# Patient Record
Sex: Male | Born: 1975 | Race: Black or African American | Hispanic: No | Marital: Single | Smoking: Former smoker
Health system: Southern US, Community
[De-identification: ages and names within clinical notes are randomized; demographics above are authoritative.]

## PROBLEM LIST (undated history)

## (undated) DIAGNOSIS — T7840XA Allergy, unspecified, initial encounter: Secondary | ICD-10-CM

## (undated) DIAGNOSIS — B20 Human immunodeficiency virus [HIV] disease: Secondary | ICD-10-CM

## (undated) DIAGNOSIS — Z21 Asymptomatic human immunodeficiency virus [HIV] infection status: Secondary | ICD-10-CM

## (undated) DIAGNOSIS — C469 Kaposi's sarcoma, unspecified: Secondary | ICD-10-CM

## (undated) DIAGNOSIS — C801 Malignant (primary) neoplasm, unspecified: Secondary | ICD-10-CM

## (undated) DIAGNOSIS — A630 Anogenital (venereal) warts: Secondary | ICD-10-CM

## (undated) HISTORY — DX: Allergy, unspecified, initial encounter: T78.40XA

## (undated) HISTORY — DX: Malignant (primary) neoplasm, unspecified: C80.1

## (undated) HISTORY — PX: OTHER SURGICAL HISTORY: SHX169

## (undated) HISTORY — DX: Kaposi's sarcoma, unspecified: C46.9

---

## 2003-08-16 ENCOUNTER — Emergency Department (HOSPITAL_COMMUNITY): Admission: EM | Admit: 2003-08-16 | Discharge: 2003-08-16 | Payer: Self-pay | Admitting: Emergency Medicine

## 2004-07-31 ENCOUNTER — Ambulatory Visit (HOSPITAL_COMMUNITY): Admission: RE | Admit: 2004-07-31 | Discharge: 2004-07-31 | Payer: Self-pay | Admitting: Infectious Diseases

## 2004-07-31 ENCOUNTER — Ambulatory Visit: Payer: Self-pay | Admitting: Infectious Diseases

## 2004-09-04 ENCOUNTER — Ambulatory Visit (HOSPITAL_COMMUNITY): Admission: RE | Admit: 2004-09-04 | Discharge: 2004-09-04 | Payer: Self-pay | Admitting: Infectious Diseases

## 2004-09-04 ENCOUNTER — Encounter (INDEPENDENT_AMBULATORY_CARE_PROVIDER_SITE_OTHER): Payer: Self-pay | Admitting: *Deleted

## 2004-09-04 ENCOUNTER — Ambulatory Visit: Payer: Self-pay | Admitting: Infectious Diseases

## 2004-09-04 LAB — CONVERTED CEMR LAB
CD4 Count: 230 uL
CD4 T Cell Abs: 230

## 2004-09-10 ENCOUNTER — Ambulatory Visit: Payer: Self-pay | Admitting: Infectious Diseases

## 2004-10-01 ENCOUNTER — Ambulatory Visit: Payer: Self-pay | Admitting: Infectious Diseases

## 2004-10-07 ENCOUNTER — Ambulatory Visit: Payer: Self-pay | Admitting: Infectious Diseases

## 2004-11-04 ENCOUNTER — Ambulatory Visit: Payer: Self-pay | Admitting: Infectious Diseases

## 2004-12-02 ENCOUNTER — Ambulatory Visit: Payer: Self-pay | Admitting: Infectious Diseases

## 2005-01-06 ENCOUNTER — Emergency Department (HOSPITAL_COMMUNITY): Admission: EM | Admit: 2005-01-06 | Discharge: 2005-01-06 | Payer: Self-pay | Admitting: Emergency Medicine

## 2005-01-28 ENCOUNTER — Ambulatory Visit: Payer: Self-pay | Admitting: Infectious Diseases

## 2005-03-23 ENCOUNTER — Ambulatory Visit: Payer: Self-pay | Admitting: Infectious Diseases

## 2005-06-17 ENCOUNTER — Ambulatory Visit: Payer: Self-pay | Admitting: Infectious Diseases

## 2005-09-08 ENCOUNTER — Ambulatory Visit: Payer: Self-pay | Admitting: Infectious Diseases

## 2005-12-01 ENCOUNTER — Encounter (INDEPENDENT_AMBULATORY_CARE_PROVIDER_SITE_OTHER): Payer: Self-pay | Admitting: *Deleted

## 2005-12-01 ENCOUNTER — Ambulatory Visit: Payer: Self-pay | Admitting: Infectious Diseases

## 2006-02-23 ENCOUNTER — Encounter (INDEPENDENT_AMBULATORY_CARE_PROVIDER_SITE_OTHER): Payer: Self-pay | Admitting: *Deleted

## 2006-02-23 ENCOUNTER — Ambulatory Visit: Payer: Self-pay | Admitting: Infectious Diseases

## 2006-03-03 ENCOUNTER — Ambulatory Visit: Payer: Self-pay | Admitting: Internal Medicine

## 2006-04-28 ENCOUNTER — Emergency Department (HOSPITAL_COMMUNITY): Admission: EM | Admit: 2006-04-28 | Discharge: 2006-04-29 | Payer: Self-pay | Admitting: Emergency Medicine

## 2006-05-20 ENCOUNTER — Encounter (INDEPENDENT_AMBULATORY_CARE_PROVIDER_SITE_OTHER): Payer: Self-pay | Admitting: *Deleted

## 2006-05-20 ENCOUNTER — Ambulatory Visit: Payer: Self-pay | Admitting: Infectious Diseases

## 2006-05-20 LAB — CONVERTED CEMR LAB
Albumin: 4.4 g/dL (ref 3.5–5.2)
Bilirubin, Direct: 0.1 mg/dL (ref 0.0–0.3)
CD4 Count: 355 microliters
CO2: 29 meq/L (ref 19–32)
Chloride: 106 meq/L (ref 96–112)
Glucose, Bld: 80 mg/dL (ref 70–99)
HDL: 59 mg/dL (ref >39)
HIV 1 RNA Quant: 49 copies/mL
LDL Cholesterol: 92 mg/dL (ref 0–99)
Potassium: 4.2 meq/L (ref 3.5–5.3)
Sodium: 141 meq/L (ref 135–145)
Total Bilirubin: 0.5 mg/dL (ref 0.3–1.2)
Total CHOL/HDL Ratio: 2.9
VLDL: 19 mg/dL (ref 0–40)

## 2006-05-21 DIAGNOSIS — B20 Human immunodeficiency virus [HIV] disease: Secondary | ICD-10-CM

## 2006-05-27 ENCOUNTER — Encounter: Payer: Self-pay | Admitting: Internal Medicine

## 2006-07-12 ENCOUNTER — Encounter (INDEPENDENT_AMBULATORY_CARE_PROVIDER_SITE_OTHER): Payer: Self-pay | Admitting: *Deleted

## 2006-07-12 LAB — CONVERTED CEMR LAB

## 2006-07-25 ENCOUNTER — Encounter (INDEPENDENT_AMBULATORY_CARE_PROVIDER_SITE_OTHER): Payer: Self-pay | Admitting: *Deleted

## 2006-08-11 ENCOUNTER — Encounter: Payer: Self-pay | Admitting: Internal Medicine

## 2006-08-11 ENCOUNTER — Ambulatory Visit: Payer: Self-pay | Admitting: Infectious Diseases

## 2006-08-11 LAB — CONVERTED CEMR LAB
ALT: 11 units/L (ref 0–53)
AST: 14 units/L (ref 0–37)
Albumin: 4.5 g/dL (ref 3.5–5.2)
Bilirubin, Direct: 0.1 mg/dL (ref 0.0–0.3)
CD4 Count: 464 microliters
CO2: 24 meq/L (ref 19–32)
Calcium: 9.3 mg/dL (ref 8.4–10.5)
Cholesterol: 174 mg/dL (ref 0–200)
Glucose, Bld: 95 mg/dL (ref 70–99)
HCV Ab: NEGATIVE
HDL: 55 mg/dL (ref >39)
HIV 1 RNA Quant: 49 copies/mL
Hepatitis B Surface Ag: NEGATIVE
Phosphorus: 3.2 mg/dL (ref 2.3–4.6)
Potassium: 3.8 meq/L (ref 3.5–5.3)
RBC / HPF: NONE SEEN (ref <3)
Sodium: 141 meq/L (ref 135–145)
Specific Gravity, Urine: 1.03 (ref 1.005–1.03)
Total Bilirubin: 0.5 mg/dL (ref 0.3–1.2)
Total CHOL/HDL Ratio: 3.2
Urine Glucose: NEGATIVE mg/dL
VLDL: 15 mg/dL (ref 0–40)
WBC, UA: NONE SEEN cells/hpf (ref <3)
pH: 6 (ref 5.0–8.0)

## 2006-09-27 ENCOUNTER — Emergency Department (HOSPITAL_COMMUNITY): Admission: EM | Admit: 2006-09-27 | Discharge: 2006-09-27 | Payer: Self-pay | Admitting: Emergency Medicine

## 2006-10-13 ENCOUNTER — Ambulatory Visit: Payer: Self-pay | Admitting: Internal Medicine

## 2006-10-13 ENCOUNTER — Telehealth: Payer: Self-pay | Admitting: Internal Medicine

## 2006-10-13 DIAGNOSIS — Z87898 Personal history of other specified conditions: Secondary | ICD-10-CM

## 2006-10-13 DIAGNOSIS — A630 Anogenital (venereal) warts: Secondary | ICD-10-CM | POA: Insufficient documentation

## 2006-10-13 DIAGNOSIS — K644 Residual hemorrhoidal skin tags: Secondary | ICD-10-CM | POA: Insufficient documentation

## 2006-10-13 DIAGNOSIS — Z87891 Personal history of nicotine dependence: Secondary | ICD-10-CM

## 2006-10-14 DIAGNOSIS — G44209 Tension-type headache, unspecified, not intractable: Secondary | ICD-10-CM

## 2006-10-14 DIAGNOSIS — R21 Rash and other nonspecific skin eruption: Secondary | ICD-10-CM

## 2006-10-15 ENCOUNTER — Telehealth: Payer: Self-pay | Admitting: Internal Medicine

## 2006-10-28 ENCOUNTER — Telehealth: Payer: Self-pay | Admitting: Internal Medicine

## 2006-11-11 ENCOUNTER — Ambulatory Visit: Payer: Self-pay | Admitting: Infectious Diseases

## 2006-11-11 ENCOUNTER — Encounter: Payer: Self-pay | Admitting: Internal Medicine

## 2006-12-02 ENCOUNTER — Ambulatory Visit: Payer: Self-pay | Admitting: Internal Medicine

## 2006-12-02 LAB — CONVERTED CEMR LAB: Chlamydia, Swab/Urine, PCR: NEGATIVE

## 2006-12-09 ENCOUNTER — Encounter: Payer: Self-pay | Admitting: Internal Medicine

## 2006-12-09 ENCOUNTER — Ambulatory Visit: Payer: Self-pay | Admitting: Infectious Diseases

## 2006-12-14 ENCOUNTER — Encounter: Payer: Self-pay | Admitting: Internal Medicine

## 2006-12-21 ENCOUNTER — Emergency Department (HOSPITAL_COMMUNITY): Admission: EM | Admit: 2006-12-21 | Discharge: 2006-12-21 | Payer: Self-pay | Admitting: Emergency Medicine

## 2006-12-23 ENCOUNTER — Telehealth: Payer: Self-pay | Admitting: Internal Medicine

## 2006-12-27 ENCOUNTER — Ambulatory Visit: Payer: Self-pay | Admitting: Infectious Diseases

## 2006-12-27 ENCOUNTER — Encounter: Payer: Self-pay | Admitting: Internal Medicine

## 2007-01-04 ENCOUNTER — Telehealth: Payer: Self-pay | Admitting: Internal Medicine

## 2007-01-10 ENCOUNTER — Encounter (INDEPENDENT_AMBULATORY_CARE_PROVIDER_SITE_OTHER): Payer: Self-pay | Admitting: *Deleted

## 2007-01-24 ENCOUNTER — Encounter: Payer: Self-pay | Admitting: Internal Medicine

## 2007-01-24 ENCOUNTER — Ambulatory Visit: Payer: Self-pay | Admitting: Internal Medicine

## 2007-01-24 LAB — CONVERTED CEMR LAB
ALT: 13 units/L (ref 0–53)
AST: 14 units/L (ref 0–37)
BUN: 10 mg/dL (ref 6–23)
Bilirubin Urine: NEGATIVE
Bilirubin, Direct: 0.1 mg/dL (ref 0.0–0.3)
CD4 Count: 332 microliters
Calcium: 9 mg/dL (ref 8.4–10.5)
Cholesterol: 185 mg/dL (ref 0–200)
Glucose, Bld: 96 mg/dL (ref 70–99)
HIV 1 RNA Quant: 2264 copies/mL
Hemoglobin, Urine: NEGATIVE
Indirect Bilirubin: 0.3 mg/dL (ref 0.0–0.9)
Ketones, ur: NEGATIVE mg/dL
Potassium: 4 meq/L (ref 3.5–5.3)
Sodium: 142 meq/L (ref 135–145)
Specific Gravity, Urine: 1.025 (ref 1.005–1.03)
Total Protein: 7.3 g/dL (ref 6.0–8.3)
Triglycerides: 109 mg/dL (ref <150)
Urine Glucose: NEGATIVE mg/dL
pH: 6.5 (ref 5.0–8.0)

## 2007-04-18 ENCOUNTER — Encounter: Payer: Self-pay | Admitting: Internal Medicine

## 2007-04-18 ENCOUNTER — Ambulatory Visit: Payer: Self-pay | Admitting: Infectious Diseases

## 2007-04-18 LAB — CONVERTED CEMR LAB
ALT: 54 units/L — ABNORMAL HIGH (ref 0–53)
Albumin: 4.5 g/dL (ref 3.5–5.2)
BUN: 13 mg/dL (ref 6–23)
Cholesterol: 156 mg/dL (ref 0–200)
HDL: 58 mg/dL (ref >39)
Indirect Bilirubin: 0.3 mg/dL (ref 0.0–0.9)
Potassium: 4 meq/L (ref 3.5–5.3)
Sodium: 143 meq/L (ref 135–145)
Total CHOL/HDL Ratio: 2.7
Total Protein: 7.2 g/dL (ref 6.0–8.3)
Triglycerides: 88 mg/dL (ref <150)
VLDL: 18 mg/dL (ref 0–40)

## 2010-06-15 LAB — CONVERTED CEMR LAB
AST: 14 units/L (ref 0–37)
Albumin: 4.3 g/dL (ref 3.5–5.2)
Alkaline Phosphatase: 67 units/L (ref 39–117)
Alkaline Phosphatase: 69 units/L (ref 39–117)
BUN: 10 mg/dL (ref 6–23)
BUN: 13 mg/dL (ref 6–23)
Bilirubin Urine: NEGATIVE
CD4 Count: 307 microliters
CD4 Count: 407 microliters
CO2: 27 meq/L (ref 19–32)
Calcium: 9.5 mg/dL (ref 8.4–10.5)
Calcium: 9.5 mg/dL (ref 8.4–10.5)
Chloride: 105 meq/L (ref 96–112)
Cholesterol: 173 mg/dL (ref 0–200)
Creatinine, Ser: 1.04 mg/dL (ref 0.40–1.50)
Creatinine, Ser: 1.15 mg/dL (ref 0.40–1.50)
Glucose, Bld: 68 mg/dL — ABNORMAL LOW (ref 70–99)
HDL: 53 mg/dL (ref >39)
HIV 1 RNA Quant: 2424 copies/mL
Indirect Bilirubin: 0.3 mg/dL (ref 0.0–0.9)
LDL Cholesterol: 105 mg/dL — ABNORMAL HIGH (ref 0–99)
Nitrite: NEGATIVE
Phosphorus: 2.8 mg/dL (ref 2.3–4.6)
Protein, ur: NEGATIVE mg/dL
Specific Gravity, Urine: 1.01 (ref 1.005–1.03)
Total Bilirubin: 0.4 mg/dL (ref 0.3–1.2)
Total Protein: 6.9 g/dL (ref 6.0–8.3)
Total Protein: 7.1 g/dL (ref 6.0–8.3)
Triglycerides: 74 mg/dL (ref <150)
Urobilinogen, UA: 0.2 (ref 0.0–1.0)

## 2011-03-02 LAB — URINALYSIS, ROUTINE W REFLEX MICROSCOPIC
Bilirubin Urine: NEGATIVE
Glucose, UA: NEGATIVE
Hgb urine dipstick: NEGATIVE
Ketones, ur: NEGATIVE
Nitrite: NEGATIVE
Protein, ur: NEGATIVE
Specific Gravity, Urine: 1.019
Urobilinogen, UA: 0.2
pH: 6.5

## 2011-08-11 ENCOUNTER — Emergency Department (HOSPITAL_COMMUNITY): Payer: Self-pay

## 2011-08-11 ENCOUNTER — Encounter (HOSPITAL_COMMUNITY): Admission: EM | Disposition: A | Discharge status: Home / Self Care | Payer: Self-pay | Source: Home / Self Care

## 2011-08-11 ENCOUNTER — Emergency Department (HOSPITAL_COMMUNITY): Payer: Self-pay | Admitting: Certified Registered Nurse Anesthetist

## 2011-08-11 ENCOUNTER — Encounter (HOSPITAL_COMMUNITY): Payer: Self-pay | Admitting: Certified Registered Nurse Anesthetist

## 2011-08-11 ENCOUNTER — Encounter (HOSPITAL_COMMUNITY): Payer: Self-pay | Admitting: *Deleted

## 2011-08-11 ENCOUNTER — Inpatient Hospital Stay (HOSPITAL_COMMUNITY)
Admission: EM | Admit: 2011-08-11 | Discharge: 2011-08-14 | DRG: 349 | Disposition: A | Discharge status: Home / Self Care | Payer: Self-pay | Attending: General Surgery | Admitting: General Surgery

## 2011-08-11 DIAGNOSIS — K611 Rectal abscess: Secondary | ICD-10-CM

## 2011-08-11 DIAGNOSIS — K612 Anorectal abscess: Secondary | ICD-10-CM

## 2011-08-11 DIAGNOSIS — Z21 Asymptomatic human immunodeficiency virus [HIV] infection status: Secondary | ICD-10-CM | POA: Diagnosis present

## 2011-08-11 DIAGNOSIS — F17200 Nicotine dependence, unspecified, uncomplicated: Secondary | ICD-10-CM | POA: Diagnosis present

## 2011-08-11 HISTORY — DX: Human immunodeficiency virus (HIV) disease: B20

## 2011-08-11 HISTORY — DX: Anogenital (venereal) warts: A63.0

## 2011-08-11 HISTORY — DX: Asymptomatic human immunodeficiency virus (hiv) infection status: Z21

## 2011-08-11 HISTORY — PX: INCISION AND DRAINAGE PERIRECTAL ABSCESS: SHX1804

## 2011-08-11 LAB — URINALYSIS, ROUTINE W REFLEX MICROSCOPIC
Bilirubin Urine: NEGATIVE
Ketones, ur: NEGATIVE mg/dL
Specific Gravity, Urine: 1.022 (ref 1.005–1.030)
pH: 6 (ref 5.0–8.0)

## 2011-08-11 LAB — DIFFERENTIAL
Basophils Relative: 0 % (ref 0–1)
Eosinophils Absolute: 0 10*3/uL (ref 0.0–0.7)
Lymphs Abs: 1.4 10*3/uL (ref 0.7–4.0)
Monocytes Relative: 13 % — ABNORMAL HIGH (ref 3–12)
Neutro Abs: 9 10*3/uL — ABNORMAL HIGH (ref 1.7–7.7)
Neutrophils Relative %: 75 % (ref 43–77)

## 2011-08-11 LAB — CBC
Hemoglobin: 14.1 g/dL (ref 13.0–17.0)
MCH: 30.5 pg (ref 26.0–34.0)
Platelets: 201 10*3/uL (ref 150–400)
RBC: 4.62 MIL/uL (ref 4.22–5.81)
WBC: 11.9 10*3/uL — ABNORMAL HIGH (ref 4.0–10.5)

## 2011-08-11 LAB — COMPREHENSIVE METABOLIC PANEL
ALT: 16 U/L (ref 0–53)
Albumin: 3.4 g/dL — ABNORMAL LOW (ref 3.5–5.2)
Alkaline Phosphatase: 75 U/L (ref 39–117)
Chloride: 101 mEq/L (ref 96–112)
Glucose, Bld: 104 mg/dL — ABNORMAL HIGH (ref 70–99)
Potassium: 3.5 mEq/L (ref 3.5–5.1)
Sodium: 136 mEq/L (ref 135–145)
Total Bilirubin: 0.5 mg/dL (ref 0.3–1.2)
Total Protein: 7.2 g/dL (ref 6.0–8.3)

## 2011-08-11 LAB — URINE MICROSCOPIC-ADD ON

## 2011-08-11 SURGERY — INCISION AND DRAINAGE, ABSCESS, PERIRECTAL
Anesthesia: General | Site: Anus | Wound class: Clean Contaminated

## 2011-08-11 MED ORDER — FENTANYL CITRATE 0.05 MG/ML IJ SOLN
INTRAMUSCULAR | Status: DC | PRN
  Administered 2011-08-11 (×2): 100 ug via INTRAVENOUS
  Administered 2011-08-11: 50 ug via INTRAVENOUS

## 2011-08-11 MED ORDER — 0.9 % SODIUM CHLORIDE (POUR BTL) OPTIME
TOPICAL | Status: DC | PRN
  Administered 2011-08-11: 1000 mL

## 2011-08-11 MED ORDER — OXYCODONE-ACETAMINOPHEN 5-325 MG PO TABS
1.0000 | ORAL_TABLET | ORAL | Status: DC | PRN
  Administered 2011-08-11 – 2011-08-12 (×3): 2 via ORAL
  Administered 2011-08-12: 1 via ORAL
  Administered 2011-08-12 – 2011-08-14 (×5): 2 via ORAL
  Filled 2011-08-11 (×9): qty 2

## 2011-08-11 MED ORDER — ONDANSETRON HCL 4 MG/2ML IJ SOLN: 4.0000 mg | Freq: Once | INTRAMUSCULAR | Status: DC | PRN

## 2011-08-11 MED ORDER — HYDROMORPHONE HCL PF 1 MG/ML IJ SOLN
INTRAMUSCULAR | Status: AC
  Administered 2011-08-11: 0.25 mg via INTRAVENOUS
  Filled 2011-08-11: qty 1

## 2011-08-11 MED ORDER — LACTATED RINGERS IV SOLN
INTRAVENOUS | Status: DC | PRN
  Administered 2011-08-11: 17:00:00 via INTRAVENOUS

## 2011-08-11 MED ORDER — SODIUM CHLORIDE 0.9 % IV SOLN
INTRAVENOUS | Status: DC | PRN
  Administered 2011-08-11: 16:00:00 via INTRAVENOUS

## 2011-08-11 MED ORDER — PIPERACILLIN-TAZOBACTAM 3.375 G IVPB
3.3750 g | INTRAVENOUS | Status: AC
  Filled 2011-08-11: qty 50

## 2011-08-11 MED ORDER — PIPERACILLIN-TAZOBACTAM 3.375 G IVPB
3.3750 g | Freq: Three times a day (TID) | INTRAVENOUS | Status: DC
  Administered 2011-08-11 – 2011-08-14 (×8): 3.375 g via INTRAVENOUS
  Filled 2011-08-11 (×11): qty 50

## 2011-08-11 MED ORDER — ONDANSETRON HCL 4 MG PO TABS: 4.0000 mg | ORAL_TABLET | Freq: Four times a day (QID) | ORAL | Status: DC | PRN

## 2011-08-11 MED ORDER — IOHEXOL 300 MG/ML  SOLN
20.0000 mL | INTRAMUSCULAR | Status: AC
  Administered 2011-08-11 (×2): 20 mL via ORAL

## 2011-08-11 MED ORDER — HYDROMORPHONE HCL PF 1 MG/ML IJ SOLN
1.0000 mg | Freq: Once | INTRAMUSCULAR | Status: AC
  Administered 2011-08-11: 1 mg via INTRAVENOUS
  Filled 2011-08-11: qty 1

## 2011-08-11 MED ORDER — ACETAMINOPHEN 325 MG PO TABS: 650.0000 mg | ORAL_TABLET | Freq: Once | ORAL | Status: DC

## 2011-08-11 MED ORDER — BUPIVACAINE-EPINEPHRINE PF 0.5-1:200000 % IJ SOLN
INTRAMUSCULAR | Status: DC | PRN
  Administered 2011-08-11: 30 mL

## 2011-08-11 MED ORDER — ONDANSETRON HCL 4 MG/2ML IJ SOLN
4.0000 mg | Freq: Once | INTRAMUSCULAR | Status: AC
  Administered 2011-08-11: 4 mg via INTRAVENOUS
  Filled 2011-08-11: qty 2

## 2011-08-11 MED ORDER — IOHEXOL 300 MG/ML  SOLN
100.0000 mL | Freq: Once | INTRAMUSCULAR | Status: AC | PRN
  Administered 2011-08-11: 100 mL via INTRAVENOUS

## 2011-08-11 MED ORDER — MIDAZOLAM HCL 5 MG/5ML IJ SOLN
INTRAMUSCULAR | Status: DC | PRN
  Administered 2011-08-11: 2 mg via INTRAVENOUS

## 2011-08-11 MED ORDER — ACETAMINOPHEN 325 MG PO TABS
650.0000 mg | ORAL_TABLET | Freq: Once | ORAL | Status: AC
  Administered 2011-08-11: 650 mg via ORAL
  Filled 2011-08-11: qty 2

## 2011-08-11 MED ORDER — POTASSIUM CHLORIDE IN NACL 20-0.9 MEQ/L-% IV SOLN
INTRAVENOUS | Status: DC
  Administered 2011-08-11: 22:00:00 via INTRAVENOUS
  Administered 2011-08-12: 75 mL/h via INTRAVENOUS
  Administered 2011-08-13: 19:00:00 via INTRAVENOUS
  Administered 2011-08-14: 75 mL/h via INTRAVENOUS
  Filled 2011-08-11 (×7): qty 1000

## 2011-08-11 MED ORDER — ONDANSETRON HCL 4 MG/2ML IJ SOLN: 4.0000 mg | Freq: Four times a day (QID) | INTRAMUSCULAR | Status: DC | PRN

## 2011-08-11 MED ORDER — PROPOFOL 10 MG/ML IV BOLUS
INTRAVENOUS | Status: DC | PRN
  Administered 2011-08-11: 50 mg via INTRAVENOUS
  Administered 2011-08-11: 200 mg via INTRAVENOUS

## 2011-08-11 MED ORDER — MORPHINE SULFATE 2 MG/ML IJ SOLN
2.0000 mg | INTRAMUSCULAR | Status: DC | PRN
  Administered 2011-08-12: 2 mg via INTRAVENOUS
  Administered 2011-08-12: 4 mg via INTRAVENOUS
  Filled 2011-08-11: qty 1
  Filled 2011-08-11 (×2): qty 2

## 2011-08-11 MED ORDER — PIPERACILLIN-TAZOBACTAM 3.375 G IVPB 30 MIN
INTRAVENOUS | Status: DC | PRN
  Administered 2011-08-11: 3.375 g via INTRAVENOUS

## 2011-08-11 MED ORDER — LIDOCAINE HCL (CARDIAC) 20 MG/ML IV SOLN
INTRAVENOUS | Status: DC | PRN
  Administered 2011-08-11: 100 mg via INTRAVENOUS

## 2011-08-11 MED ORDER — HYDROMORPHONE HCL PF 1 MG/ML IJ SOLN
0.2500 mg | INTRAMUSCULAR | Status: DC | PRN
  Administered 2011-08-11 (×2): 0.25 mg via INTRAVENOUS

## 2011-08-11 SURGICAL SUPPLY — 41 items
BLADE SURG 15 STRL LF DISP TIS (BLADE) ×1 IMPLANT
BLADE SURG 15 STRL SS (BLADE) ×2
CANISTER SUCTION 2500CC (MISCELLANEOUS) ×2 IMPLANT
CLEANER TIP ELECTROSURG 2X2 (MISCELLANEOUS) IMPLANT
CLOTH BEACON ORANGE TIMEOUT ST (SAFETY) ×2 IMPLANT
COVER SURGICAL LIGHT HANDLE (MISCELLANEOUS) ×2 IMPLANT
DRAPE LAPAROTOMY T 102X78X121 (DRAPES) ×2 IMPLANT
DRSG PAD ABDOMINAL 8X10 ST (GAUZE/BANDAGES/DRESSINGS) IMPLANT
ELECT REM PT RETURN 9FT ADLT (ELECTROSURGICAL)
ELECTRODE REM PT RTRN 9FT ADLT (ELECTROSURGICAL) IMPLANT
GAUZE PACKING IODOFORM 1 (PACKING) IMPLANT
GAUZE PACKING IODOFORM 1/2 (PACKING) ×2 IMPLANT
GAUZE SPONGE 4X4 16PLY XRAY LF (GAUZE/BANDAGES/DRESSINGS) IMPLANT
GLOVE BIOGEL PI IND STRL 7.0 (GLOVE) ×2 IMPLANT
GLOVE BIOGEL PI IND STRL 8 (GLOVE) ×1 IMPLANT
GLOVE BIOGEL PI INDICATOR 7.0 (GLOVE) ×2
GLOVE BIOGEL PI INDICATOR 8 (GLOVE) ×1
GLOVE SS BIOGEL STRL SZ 7.5 (GLOVE) ×1 IMPLANT
GLOVE SUPERSENSE BIOGEL SZ 7.5 (GLOVE) ×1
GLOVE SURG SS PI 7.0 STRL IVOR (GLOVE) ×2 IMPLANT
GOWN STRL NON-REIN LRG LVL3 (GOWN DISPOSABLE) ×2 IMPLANT
GOWN STRL REIN XL XLG (GOWN DISPOSABLE) ×2 IMPLANT
HOSE LASER SMOKE EVAC HA-3 STR (MISCELLANEOUS) ×2 IMPLANT
KIT BASIN OR (CUSTOM PROCEDURE TRAY) ×2 IMPLANT
KIT ROOM TURNOVER OR (KITS) ×2 IMPLANT
NS IRRIG 1000ML POUR BTL (IV SOLUTION) ×2 IMPLANT
PACK GENERAL/GYN (CUSTOM PROCEDURE TRAY) ×2 IMPLANT
PACK LITHOTOMY IV (CUSTOM PROCEDURE TRAY) IMPLANT
PAD ARMBOARD 7.5X6 YLW CONV (MISCELLANEOUS) ×2 IMPLANT
PENCIL BUTTON HOLSTER BLD 10FT (ELECTRODE) IMPLANT
SPONGE GAUZE 4X4 12PLY (GAUZE/BANDAGES/DRESSINGS) ×2 IMPLANT
SUT VIC AB 3-0 SH 18 (SUTURE) ×2 IMPLANT
SWAB COLLECTION DEVICE MRSA (MISCELLANEOUS) ×2 IMPLANT
TAPE CLOTH SURG 6X10 WHT LF (GAUZE/BANDAGES/DRESSINGS) ×2 IMPLANT
TOWEL OR 17X24 6PK STRL BLUE (TOWEL DISPOSABLE) ×2 IMPLANT
TOWEL OR 17X26 10 PK STRL BLUE (TOWEL DISPOSABLE) ×2 IMPLANT
TUBE ANAEROBIC SPECIMEN COL (MISCELLANEOUS) ×2 IMPLANT
TUBE CONNECTING 12X1/4 (SUCTIONS) ×2 IMPLANT
UNDERPAD 30X30 INCONTINENT (UNDERPADS AND DIAPERS) ×2 IMPLANT
WATER STERILE IRR 1000ML POUR (IV SOLUTION) ×2 IMPLANT
YANKAUER SUCT BULB TIP NO VENT (SUCTIONS) IMPLANT

## 2011-08-11 NOTE — ED Notes (Signed)
All of pt's clothes, shoes, backpack, jewelry piercings and cell phone were placed in a pt's belongings bag by pt's family member and the family member will take all the belongings home; pt getting ready to go to surgery

## 2011-08-11 NOTE — Preoperative (Signed)
Beta Blockers   Reason not to administer Beta Blockers:Not Applicable 

## 2011-08-11 NOTE — ED Notes (Signed)
Pt here with abd pain, nausea vomiting and rectal bleeding, sts stool is normal consistency but star shaped, does have rectal hemmorrhoids on assessment, no gross blood noted. Pt reports fever, sts that this is ongoing for 1 week. And getting worse. Patient is hot to touch.

## 2011-08-11 NOTE — H&P (Signed)
Todd Vincent 02-Feb-1976  [id number].   Primary Care MD: None yet Requesting MD: EDP Chief Complaint/Reason for Consult: perirectal abscess HPI: This is a 36 yo male who has a history of HIV who has not been treated in over 4 years.  He just recently moved back to [place] from [place] 1 week ago.  2 days ago he started developing perirectal pain and fevers.  He has had fevers at home as high as 102.8.  He has noticed some blood in his stools as well as some BRB clots.  He has also noticed his stool has changed shape into "star shapes."  He has done a self rectal exam and feels like there is some pressure being exuded on the rectal wall that is not normal.  He came to the [facility] where he had a CT scan that reveals a perirectal horseshoe type abscess.  We have been asked to see for admission.  Review of Systems: Please see HPI, otherwise all other systems are negative.  History reviewed. No pertinent family history.  Past Medical History  Diagnosis Date  . HIV (human immunodeficiency virus infection)   . Anal condyloma     Past Surgical History  Procedure Date  . Anal condyloma resection     Social History:  reports that he has been smoking.  He does not have any smokeless tobacco history on file. He reports that he drinks alcohol. He reports that he does not use illicit drugs.  Allergies:  Allergies  Allergen Reactions  . Aspirin     Medications Prior to Admission  Medication Dose Route Frequency Provider Last Rate Last Dose  . acetaminophen (TYLENOL) tablet 650 mg  650 mg Oral Once Renne Crigler, [place]   650 mg at 08/11/11 1046  . HYDROmorphone (DILAUDID) injection 1 mg  1 mg Intravenous Once Renne Crigler, PA   1 mg at 08/11/11 1308  . HYDROmorphone (DILAUDID) injection 1 mg  1 mg Intravenous Once Rodena Medin, PA-C   1 mg at 08/11/11 1409  . iohexol (OMNIPAQUE) 300 MG/ML solution 100 mL  100 mL Intravenous Once PRN Medication Radiologist, MD   100 mL at 08/11/11 1159  . iohexol  (OMNIPAQUE) 300 MG/ML solution 20 mL  20 mL Oral Q1 Hr x 2 Medication Radiologist, MD   20 mL at 08/11/11 1139  . ondansetron (ZOFRAN) injection 4 mg  4 mg Intravenous Once Renne Crigler, PA   4 mg at 08/11/11 6578  . piperacillin-tazobactam (ZOSYN) IVPB 3.375 g  3.375 g Intravenous Q8H Letha Cape, PA       No current outpatient prescriptions on file as of 08/11/2011.    Blood pressure 114/81, pulse 93, temperature 101.9 F (38.8 C), temperature source Oral, resp. rate 18, height 5\' 7"  (1.702 m), weight 140 lb (63.504 kg), SpO2 99.00%. Physical Exam: General: pleasant, WD, WN male who is laying in bed in NAD HEENT: head is normocephalic, atraumatic.  Sclera are noninjected.  PERRL.  Ears and nose without any masses or lesions.  Mouth is pink and moist Heart: regular, rate, and rhythm.  Normal s1,s2. No obvious murmurs, gallops, or rubs noted.  Palpable radial and pedal pulses bilaterally Lungs: CTAB, no wheezes, rhonchi, or rales noted.  Respiratory effort nonlabored Abd: soft, minimal tenderness, ND, +BS, no masses, hernias, or organomegaly Rectal: DRE deferred to OR, external exam reveals some skin tags as well as few condyloma.  He is tender greatest on the right side of his rectum and slightly posterior.  No frank fluctuance or erythema is noted.  No drainage noted.  No blood seen Psych: A&Ox3 with an appropriate affect.    Results for orders placed during the hospital encounter of 08/11/11 (from the past 48 hour(s))  CBC     Status: Abnormal   Collection Time   08/11/11  9:36 AM      Component Value Range Comment   WBC 11.9 (*) 4.0 - 10.5 (K/uL)    RBC 4.62  4.22 - 5.81 (MIL/uL)    Hemoglobin 14.1  13.0 - 17.0 (g/dL)    HCT 16.1  09.6 - 04.5 (%)    MCV 87.7  78.0 - 100.0 (fL)    MCH 30.5  26.0 - 34.0 (pg)    MCHC 34.8  30.0 - 36.0 (g/dL)    RDW 40.9  81.1 - 91.4 (%)    Platelets 201  150 - 400 (K/uL)   DIFFERENTIAL     Status: Abnormal   Collection Time   08/11/11  9:36 AM        Component Value Range Comment   Neutrophils Relative 75  43 - 77 (%)    Neutro Abs 9.0 (*) 1.7 - 7.7 (K/uL)    Lymphocytes Relative 11 (*) 12 - 46 (%)    Lymphs Abs 1.4  0.7 - 4.0 (K/uL)    Monocytes Relative 13 (*) 3 - 12 (%)    Monocytes Absolute 1.6 (*) 0.1 - 1.0 (K/uL)    Eosinophils Relative 0  0 - 5 (%)    Eosinophils Absolute 0.0  0.0 - 0.7 (K/uL)    Basophils Relative 0  0 - 1 (%)    Basophils Absolute 0.0  0.0 - 0.1 (K/uL)   COMPREHENSIVE METABOLIC PANEL     Status: Abnormal   Collection Time   08/11/11  9:36 AM      Component Value Range Comment   Sodium 136  135 - 145 (mEq/L)    Potassium 3.5  3.5 - 5.1 (mEq/L)    Chloride 101  96 - 112 (mEq/L)    CO2 27  19 - 32 (mEq/L)    Glucose, Bld 104 (*) 70 - 99 (mg/dL)    BUN 7  6 - 23 (mg/dL)    Creatinine, Ser 7.82  0.50 - 1.35 (mg/dL)    Calcium 9.2  8.4 - 10.5 (mg/dL)    Total Protein 7.2  6.0 - 8.3 (g/dL)    Albumin 3.4 (*) 3.5 - 5.2 (g/dL)    AST 14  0 - 37 (U/L)    ALT 16  0 - 53 (U/L)    Alkaline Phosphatase 75  39 - 117 (U/L)    Total Bilirubin 0.5  0.3 - 1.2 (mg/dL)    GFR calc non Af Amer >90  >90 (mL/min)    GFR calc Af Amer >90  >90 (mL/min)   LIPASE, BLOOD     Status: Normal   Collection Time   08/11/11  9:36 AM      Component Value Range Comment   Lipase 12  11 - 59 (U/L)   OCCULT BLOOD, POC DEVICE     Status: Normal   Collection Time   08/11/11 10:18 AM      Component Value Range Comment   Fecal Occult Bld NEGATIVE     URINALYSIS, ROUTINE W REFLEX MICROSCOPIC     Status: Abnormal   Collection Time   08/11/11 10:23 AM      Component Value Range Comment  Color, Urine YELLOW  YELLOW     APPearance CLOUDY (*) CLEAR     Specific Gravity, Urine 1.022  1.005 - 1.030     pH 6.0  5.0 - 8.0     Glucose, UA NEGATIVE  NEGATIVE (mg/dL)    Hgb urine dipstick SMALL (*) NEGATIVE     Bilirubin Urine NEGATIVE  NEGATIVE     Ketones, ur NEGATIVE  NEGATIVE (mg/dL)    Protein, ur 161 (*) NEGATIVE (mg/dL)     Urobilinogen, UA 1.0  0.0 - 1.0 (mg/dL)    Nitrite NEGATIVE  NEGATIVE     Leukocytes, UA NEGATIVE  NEGATIVE    URINE MICROSCOPIC-ADD ON     Status: Normal   Collection Time   08/11/11 10:23 AM      Component Value Range Comment   Squamous Epithelial / LPF RARE  RARE     WBC, UA 0-2  <3 (WBC/hpf)    RBC / HPF 0-2  <3 (RBC/hpf)    Urine-Other MUCOUS PRESENT      Ct Abdomen Pelvis W Contrast  08/11/2011  *RADIOLOGY REPORT*  Clinical Data: Rectal pain, fever.  Question perirectal abscess.  CT ABDOMEN AND PELVIS WITH CONTRAST  Technique:  Multidetector CT imaging of the abdomen and pelvis was performed following the standard protocol during bolus administration of intravenous contrast.  Contrast:  100 ml Omnipaque 300 IV.  Comparison: None  Findings: Lung bases are clear.  No effusions.  Heart is normal size.  Liver, gallbladder, spleen, pancreas, adrenals and kidneys are unremarkable.  Appendix is visualized and is normal.  There is a fluid collection posterior to the anorectal junction and extending into the left lateral wall of the rectum compatible with abscess.  A large component is noted posterior to the rectum, measuring 4.0 x 1.8 cm.  Surrounding inflammatory stranding.  The remainder of the large and small bowel are unremarkable.  No evidence of bowel obstruction.  Urinary bladder is unremarkable.  No acute bony abnormality.  IMPRESSION: Posterior perirectal abscess which extends into the left lateral wall of the rectum.  Original Report Authenticated By: Cyndie Chime, M.D.       Assessment/Plan 1. Perirectal horseshoe abscess 2. HIV  Plan: 1. We will get the patient admitted and take him to the operating room where he will undergo an incision and drainage.  We have discussed this with him along with risks and complications including but not limited to bleeding and infection.  I have explained to him that he will likely require wound care and dressing changes post-operatively.  He  understands and wishes to proceed.  We will start him on Zosyn currently.  We will likely obtain cultures in the OR.  Kyliyah Stirn E 08/11/2011, 3:11 PM

## 2011-08-11 NOTE — H&P (Signed)
I have interviewed and examined the patient and personally reviewed the imaging. I agree with Barnetta Chapel PA note above.he has a large high posterior perirectal abscess that'll require incision and drainage in the operating room. I discussed the procedure with him including indications and risks of general anesthesia, bleeding, infection, and possible fistula formation. He understands and agrees.

## 2011-08-11 NOTE — Op Note (Signed)
Preoperative Diagnosis: Perirectal abscess [566] rectal bleeding fever  Postoprative Diagnosis: Perirectal abscess [566] rectal bleeding fever  Procedure: Procedure(s): IRRIGATION AND DEBRIDEMENT PERIRECTAL ABSCESS   Surgeon: Glenna Fellows T   Assistants: None  Anesthesia:  General LMA anesthesiaDiagnos  Indications:  Patient is a 36 year old male HIV positive who presents with 48 hours of increasing rectal pain, fever, and some bloody rectal drainage. External rectal exam is unremarkable for mass or inflammation. CT scan was performed which shows an extensive supralevator posterior horseshoe abscess. I recommended exam and incision and drainage under anesthesia in the operating room. Indications for the procedure and risks were discussed and all his questions answered.   Procedure Detail: Patient is brought to the operating room, placed in supine position on the operating table, and laryngeal mask general anesthesia induced. He was carefully padded and positioned in lithotomy position and the perineum widely sterilely prepped and draped. Patient timeout was performed and correct procedure verified. He had received broad-spectrum IV antibiotics. Digital rectal exam showed some purulent drainage from the anus. There was a palpable mass and swelling posteriorly and in the left lateral position high above the anus about 5 or 6 cm. Direct transanal exam did not reveal a specific opening or source of drainage. I then made a curvilinear incision in the posterior and left lateral perirectal space and dissection was carried down through the subcutaneous tissue with cautery. Feeling the abscess internally I deepened the dissection down through a portion of the levator and entered a large abscess cavity with thick purulent material. This was cultured. Using cautery and blunt dissection at the level of the levator the cavity was further opened and there was a extensive horseshoe component extending  posteriorly and over toward the right side with a large component on the left side of the rectum. This was all opened as widely as possible and irrigated. One bleeder on the anterior portion of the levator that I opened was suture-ligated with 3-0 Vicryl. The cavity was then widely packed with 1/2 inch iodoform gauze. The soft tissue had been infiltrated with 30 cc of half percent Marcaine with epinephrine. There was no bleeding. The sponge needle and instrument counts were correct. Bulky gauze dressing was applied.   Findings: Large supralevator horseshoe perirectal abscess  Estimated Blood Loss:  Minimal         Drains: 1/2 inch iodoform gauze packing  Blood Given: none          Specimens: culture and sensitivity        Complications:  * No complications entered in OR log *         Disposition: PACU - hemodynamically stable.         Condition: stable  Mariella Saa MD, FACS  08/11/2011, 5:05 PM

## 2011-08-11 NOTE — ED Provider Notes (Signed)
History     CSN: [id number]  Arrival date & time 08/11/11  7829   First MD Initiated Contact with Patient 08/11/11 [phone]      Chief Complaint  Patient presents with  . Fever  . Rectal Bleeding    (Consider location/radiation/quality/duration/timing/severity/associated sxs/prior treatment) HPI Comments: Patient with history of HIV not currently taking ARV -- presents with onset of fever and worsening rectal pain since yesterday. Patient has noted some bright red blood in stool and on toilet paper as well. Fever to 102F at home. Motrin without relief. No URI symptoms, vomiting, chest pain, cough, SOB, abdominal pain, urinary symptoms. No history of abdominal surgery.   Patient is a 36 y.o. male presenting with fever. The history is provided by the patient.  Fever Primary symptoms of the febrile illness include fever and nausea. Primary symptoms do not include headaches, cough, shortness of breath, abdominal pain, vomiting, diarrhea, dysuria, myalgias or rash.  Risk factors for febrile illness include HIV.   Past Medical History  Diagnosis Date  . HIV (human immunodeficiency virus infection)     History reviewed. No pertinent past surgical history.  No family history on file.  History  Substance Use Topics  . Smoking status: Current Everyday Smoker  . Smokeless tobacco: Not on file  . Alcohol Use: Yes      Review of Systems  Constitutional: Positive for fever.  HENT: Negative for sore throat and rhinorrhea.   Eyes: Negative for redness.  Respiratory: Negative for cough and shortness of breath.   Cardiovascular: Negative for chest pain.  Gastrointestinal: Positive for nausea, blood in stool and rectal pain. Negative for vomiting, abdominal pain and diarrhea.  Genitourinary: Negative for dysuria and hematuria.  Musculoskeletal: Negative for myalgias and back pain.  Skin: Negative for rash.  Neurological: Negative for headaches.    Allergies  Aspirin  Home  Medications   Current Outpatient Rx  Name Route Sig Dispense Refill  . IBUPROFEN 200 MG PO TABS Oral Take 400 mg by mouth every 8 (eight) hours as needed. For pain      BP 109/70  Pulse 135  Temp(Src) 101.9 F (38.8 C) (Oral)  Resp 16  Ht 5\' 7"  (1.702 m)  Wt 140 lb (63.504 kg)  BMI 21.93 kg/m2  SpO2 98%  Physical Exam  Nursing note and vitals reviewed. Constitutional: He is oriented to person, place, and time. He appears well-developed and well-nourished.  HENT:  Head: Normocephalic and atraumatic.  Eyes: Conjunctivae are normal. Right eye exhibits no discharge. Left eye exhibits no discharge.  Neck: Normal range of motion. Neck supple.  Cardiovascular: Regular rhythm and normal heart sounds.  Tachycardia present.   Pulses:      Radial pulses are 2+ on the right side, and 2+ on the left side.  Pulmonary/Chest: Effort normal and breath sounds normal.  Abdominal: Soft. Bowel sounds are normal. There is no tenderness. There is no rebound and no guarding.  Genitourinary: Rectal exam shows external hemorrhoid and tenderness. Rectal exam shows no internal hemorrhoid, no fissure and no mass.  Musculoskeletal: He exhibits no edema and no tenderness.  Neurological: He is alert and oriented to person, place, and time.  Skin: Skin is warm and dry.  Psychiatric: He has a normal mood and affect.    ED Course  Procedures (including critical care time)  Labs Reviewed  CBC - Abnormal; Notable for the following:    WBC 11.9 (*)    All other components within normal limits  DIFFERENTIAL - Abnormal; Notable for the following:    Neutro Abs 9.0 (*)    Lymphocytes Relative 11 (*)    Monocytes Relative 13 (*)    Monocytes Absolute 1.6 (*)    All other components within normal limits  COMPREHENSIVE METABOLIC PANEL - Abnormal; Notable for the following:    Glucose, Bld 104 (*)    Albumin 3.4 (*)    All other components within normal limits  URINALYSIS, ROUTINE W REFLEX MICROSCOPIC -  Abnormal; Notable for the following:    APPearance CLOUDY (*)    Hgb urine dipstick SMALL (*)    Protein, ur 100 (*)    All other components within normal limits  LIPASE, BLOOD  OCCULT BLOOD, POC DEVICE  URINE MICROSCOPIC-ADD ON   Ct Abdomen Pelvis W Contrast  08/11/2011  *RADIOLOGY REPORT*  Clinical Data: Rectal pain, fever.  Question perirectal abscess.  CT ABDOMEN AND PELVIS WITH CONTRAST  Technique:  Multidetector CT imaging of the abdomen and pelvis was performed following the standard protocol during bolus administration of intravenous contrast.  Contrast:  100 ml Omnipaque 300 IV.  Comparison: None  Findings: Lung bases are clear.  No effusions.  Heart is normal size.  Liver, gallbladder, spleen, pancreas, adrenals and kidneys are unremarkable.  Appendix is visualized and is normal.  There is a fluid collection posterior to the anorectal junction and extending into the left lateral wall of the rectum compatible with abscess.  A large component is noted posterior to the rectum, measuring 4.0 x 1.8 cm.  Surrounding inflammatory stranding.  The remainder of the large and small bowel are unremarkable.  No evidence of bowel obstruction.  Urinary bladder is unremarkable.  No acute bony abnormality.  IMPRESSION: Posterior perirectal abscess which extends into the left lateral wall of the rectum.  Original Report Authenticated By: Cyndie Chime, M.D.     1. Perirectal abscess     9:18 AM Patient seen and examined. Work-up initiated. Medications ordered.   Vital signs reviewed and are as follows: Filed Vitals:   08/11/11 0841  BP: 109/70  Pulse: 135  Temp: 101.9 F (38.8 C)  Resp: 16   Rectal exam performed.   10:13 AM Patient was discussed with Dayton Bailiff, MD  Patient was moved to CDU pending CT to evaluate for abscess.   CT showed peri-rectal abscess. Dr. Johna Sheriff to see in CDU.   MDM  Perirectal abscess, surgery admit.         [place], [place] 08/11/11 [phone]

## 2011-08-11 NOTE — ED Notes (Signed)
REPORT TO CINDY IN OR

## 2011-08-11 NOTE — Transfer of Care (Signed)
Immediate Anesthesia Transfer of Care Note  Patient: Todd Vincent  Procedure(s) Performed: Procedure(s) (LRB): IRRIGATION AND DEBRIDEMENT PERIRECTAL ABSCESS (N/A)  Patient Location: PACU  Anesthesia Type: General  Level of Consciousness: sedated  Airway & Oxygen Therapy: Patient connected to face mask oxygen  Post-op Assessment: Report given to PACU RN  Post vital signs: Reviewed and stable  Complications: No apparent anesthesia complications

## 2011-08-11 NOTE — ED Notes (Signed)
Pt back from being out of the department; pt placed back on continuous pulse oximetry and blood pressure cuff; more warm blankets given

## 2011-08-11 NOTE — Progress Notes (Signed)
I met with patient to discuss his health maintenance practice. Patient states he had a doctor in [place] that he went to regularly, but he just moved to [place] last week. Patient also does not have a job or any medical assistance resources. With his permission, I referred the patient to the community liaison [facility]) and she was able to meet with him.

## 2011-08-11 NOTE — Anesthesia Procedure Notes (Signed)
Procedure Name: LMA Insertion Date/Time: 08/11/2011 4:11 PM Performed by: Molli Hazard Pre-anesthesia Checklist: Patient identified, Emergency Drugs available, Suction available and Patient being monitored Patient Re-evaluated:Patient Re-evaluated prior to inductionOxygen Delivery Method: Circle system utilized Preoxygenation: Pre-oxygenation with 100% oxygen Intubation Type: IV induction LMA: LMA inserted LMA Size: 4.0 Number of attempts: 1 Tube secured with: Tape Dental Injury: Teeth and Oropharynx as per pre-operative assessment

## 2011-08-11 NOTE — Anesthesia Postprocedure Evaluation (Signed)
  Anesthesia Post-op Note  Patient: Todd Vincent  Procedure(s) Performed: Procedure(s) (LRB): IRRIGATION AND DEBRIDEMENT PERIRECTAL ABSCESS (N/A)  Patient Location: PACU  Anesthesia Type: General  Level of Consciousness: awake, alert  and oriented  Airway and Oxygen Therapy: Patient Spontanous Breathing and Patient connected to nasal cannula oxygen  Post-op Pain: mild  Post-op Assessment: Post-op Vital signs reviewed, Patient's Cardiovascular Status Stable, Respiratory Function Stable, Patent Airway, No signs of Nausea or vomiting and Pain level controlled  Post-op Vital Signs: Reviewed and stable  Complications: No apparent anesthesia complications

## 2011-08-11 NOTE — ED Notes (Signed)
Patient reports he has a fever and rectal bleeding.  Patient also has nausea and dry heaves.  He states he has noted a change in the shape of his stools for 1 mnth.  Patient has rectal pain as well.

## 2011-08-11 NOTE — Anesthesia Preprocedure Evaluation (Addendum)
Anesthesia Evaluation  Patient identified by MRN, date of birth, ID band Patient awake    Reviewed: Allergy & Precautions, H&P , NPO status , Patient's Chart, lab work & pertinent test results  Airway Mallampati: I TM Distance: >3 FB Neck ROM: Full    Dental  (+) Teeth Intact and Dental Advisory Given   Pulmonary Current Smoker,  breath sounds clear to auscultation        Cardiovascular Rhythm:Regular Rate:Normal     Neuro/Psych    GI/Hepatic   Endo/Other    Renal/GU      Musculoskeletal   Abdominal   Peds  Hematology  (+) HIV,   Anesthesia Other Findings   Reproductive/Obstetrics                         Anesthesia Physical Anesthesia Plan  ASA: II  Anesthesia Plan: General   Post-op Pain Management:    Induction:   Airway Management Planned: LMA  Additional Equipment:   Intra-op Plan:   Post-operative Plan: Extubation in OR  Informed Consent: I have reviewed the patients History and Physical, chart, labs and discussed the procedure including the risks, benefits and alternatives for the proposed anesthesia with the patient or authorized representative who has indicated his/her understanding and acceptance.   Dental advisory given  Plan Discussed with: CRNA, Anesthesiologist and Surgeon  Anesthesia Plan Comments:         Anesthesia Quick Evaluation

## 2011-08-11 NOTE — ED Notes (Signed)
CALLED CT AND SPOKE WITH PETE. THEY ARE AWARE PT HAS FINISHED PREP

## 2011-08-11 NOTE — ED Provider Notes (Signed)
Medical screening examination/treatment/procedure(s) were performed by non-physician practitioner and as supervising physician I was immediately available for consultation/collaboration.   Aquarius Latouche, MD 08/11/11 1536 

## 2011-08-11 NOTE — ED Notes (Signed)
SURGERY HERE TO SEE PT

## 2011-08-11 NOTE — ED Notes (Signed)
Pt moved from Pod A-7 to CDU 3; pt placed on continuous pulse oximetry and blood pressure cuff; warm blankets given

## 2011-08-12 ENCOUNTER — Encounter (HOSPITAL_COMMUNITY): Payer: Self-pay | Admitting: General Surgery

## 2011-08-12 LAB — CBC
Hemoglobin: 12.4 g/dL — ABNORMAL LOW (ref 13.0–17.0)
MCH: 30 pg (ref 26.0–34.0)
MCV: 88.9 fL (ref 78.0–100.0)
Platelets: 200 10*3/uL (ref 150–400)
RBC: 4.13 MIL/uL — ABNORMAL LOW (ref 4.22–5.81)
WBC: 11.7 10*3/uL — ABNORMAL HIGH (ref 4.0–10.5)

## 2011-08-12 MED ORDER — HYDROMORPHONE HCL PF 1 MG/ML IJ SOLN
1.0000 mg | INTRAMUSCULAR | Status: DC | PRN
  Administered 2011-08-12 – 2011-08-13 (×6): 1 mg via INTRAVENOUS
  Filled 2011-08-12 (×6): qty 1

## 2011-08-12 NOTE — Progress Notes (Signed)
Patient ID: Todd Vincent, male   DOB: 18-Sep-1975, 36 y.o.   MRN: [id number] 1 Day Post-Op  Subjective: Feels much better than pre op  Objective: Vital signs in last 24 hours: Temp:  [97.8 F (36.6 C)-101.5 F (38.6 C)] 98.8 F (37.1 C) (03/27 0555) Pulse Rate:  [85-131] 87  (03/27 0555) Resp:  [15-32] 18  (03/27 0555) BP: (101-141)/(52-81) 101/57 mmHg (03/27 0555) SpO2:  [85 %-99 %] 93 % (03/27 0555) Weight:  [140 lb (63.504 kg)] 140 lb (63.504 kg) (03/26 0855)    Intake/Output from previous day: 03/26 0701 - 03/27 0700 In: 1656.3 [P.O.:240; I.V.:1316.3; IV Piggyback:100] Out: 350 [Urine:350] Intake/Output this shift:    General appearance: alert and no distress Incision/Wound: Moderate expected bloody drainage  Lab Results:   [facility] 08/12/11 0625 08/11/11 0936  WBC 11.7* 11.9*  HGB 12.4* 14.1  HCT 36.7* 40.5  PLT 200 201   BMET  Basename 08/11/11 0936  NA 136  K 3.5  CL 101  CO2 27  GLUCOSE 104*  BUN 7  CREATININE 1.00  CALCIUM 9.2     Studies/Results: Ct Abdomen Pelvis W Contrast  08/11/2011  *RADIOLOGY REPORT*  Clinical Data: Rectal pain, fever.  Question perirectal abscess.  CT ABDOMEN AND PELVIS WITH CONTRAST  Technique:  Multidetector CT imaging of the abdomen and pelvis was performed following the standard protocol during bolus administration of intravenous contrast.  Contrast:  100 ml Omnipaque 300 IV.  Comparison: None  Findings: Lung bases are clear.  No effusions.  Heart is normal size.  Liver, gallbladder, spleen, pancreas, adrenals and kidneys are unremarkable.  Appendix is visualized and is normal.  There is a fluid collection posterior to the anorectal junction and extending into the left lateral wall of the rectum compatible with abscess.  A large component is noted posterior to the rectum, measuring 4.0 x 1.8 cm.  Surrounding inflammatory stranding.  The remainder of the large and small bowel are unremarkable.  No evidence of bowel obstruction.   Urinary bladder is unremarkable.  No acute bony abnormality.  IMPRESSION: Posterior perirectal abscess which extends into the left lateral wall of the rectum.  Original Report Authenticated By: Cyndie Chime, M.D.    Anti-infectives: Anti-infectives     Start     Dose/Rate Route Frequency Ordered Stop   08/11/11 1530  piperacillin-tazobactam (ZOSYN) IVPB 3.375 g       3.375 g 12.5 mL/hr over 240 Minutes Intravenous To [facility] 08/11/11 1514 08/12/11 1530   08/11/11 1515  piperacillin-tazobactam (ZOSYN) IVPB 3.375 g       3.375 g 12.5 mL/hr over 240 Minutes Intravenous 3 times per day 08/11/11 1509            Assessment/Plan: s/p Procedure(s): IRRIGATION AND DEBRIDEMENT PERIRECTAL ABSCESS Doing well Due to size and depth of abscess will plan dressing change in OR tomorrow   LOS: 1 day    Jaretzi Droz T 08/12/2011

## 2011-08-12 NOTE — Progress Notes (Signed)
UR complete 

## 2011-08-13 ENCOUNTER — Encounter (HOSPITAL_COMMUNITY): Admission: EM | Disposition: A | Discharge status: Home / Self Care | Payer: Self-pay | Source: Home / Self Care

## 2011-08-13 ENCOUNTER — Encounter (HOSPITAL_COMMUNITY): Payer: Self-pay | Admitting: Certified Registered"

## 2011-08-13 ENCOUNTER — Inpatient Hospital Stay (HOSPITAL_COMMUNITY): Payer: Self-pay | Admitting: Certified Registered"

## 2011-08-13 DIAGNOSIS — K612 Anorectal abscess: Secondary | ICD-10-CM

## 2011-08-13 HISTORY — PX: INCISION AND DRAINAGE PERIRECTAL ABSCESS: SHX1804

## 2011-08-13 SURGERY — INCISION AND DRAINAGE, ABSCESS, PERIRECTAL
Anesthesia: General | Wound class: Clean Contaminated

## 2011-08-13 MED ORDER — FENTANYL CITRATE 0.05 MG/ML IJ SOLN
INTRAMUSCULAR | Status: DC | PRN
  Administered 2011-08-13: 150 ug via INTRAVENOUS
  Administered 2011-08-13 (×2): 50 ug via INTRAVENOUS

## 2011-08-13 MED ORDER — LIDOCAINE-EPINEPHRINE 0.5-1:200000 % IJ SOLN
INTRAMUSCULAR | Status: DC | PRN
  Administered 2011-08-13: 30 mL

## 2011-08-13 MED ORDER — SODIUM CHLORIDE 0.9 % IR SOLN
Status: DC | PRN
  Administered 2011-08-13: 1000 mL

## 2011-08-13 MED ORDER — HYDROMORPHONE HCL PF 1 MG/ML IJ SOLN: 0.2500 mg | INTRAMUSCULAR | Status: DC | PRN

## 2011-08-13 MED ORDER — LACTATED RINGERS IV SOLN
INTRAVENOUS | Status: DC | PRN
  Administered 2011-08-13: 12:00:00 via INTRAVENOUS

## 2011-08-13 MED ORDER — ONDANSETRON HCL 4 MG/2ML IJ SOLN
INTRAMUSCULAR | Status: DC | PRN
  Administered 2011-08-13: 4 mg via INTRAVENOUS

## 2011-08-13 MED ORDER — ONDANSETRON HCL 4 MG/2ML IJ SOLN: 4.0000 mg | Freq: Once | INTRAMUSCULAR | Status: DC | PRN

## 2011-08-13 MED ORDER — MIDAZOLAM HCL 5 MG/5ML IJ SOLN
INTRAMUSCULAR | Status: DC | PRN
  Administered 2011-08-13: 2 mg via INTRAVENOUS

## 2011-08-13 MED ORDER — LACTATED RINGERS IV SOLN
INTRAVENOUS | Status: DC
  Administered 2011-08-13: 12:00:00 via INTRAVENOUS

## 2011-08-13 MED ORDER — PROPOFOL 10 MG/ML IV EMUL
INTRAVENOUS | Status: DC | PRN
  Administered 2011-08-13: 200 mg via INTRAVENOUS

## 2011-08-13 SURGICAL SUPPLY — 38 items
BLADE SURG 15 STRL LF DISP TIS (BLADE) ×1 IMPLANT
BLADE SURG 15 STRL SS (BLADE) ×2
CANISTER SUCTION 2500CC (MISCELLANEOUS) ×2 IMPLANT
CLEANER TIP ELECTROSURG 2X2 (MISCELLANEOUS) IMPLANT
CLOTH BEACON ORANGE TIMEOUT ST (SAFETY) ×2 IMPLANT
COVER SURGICAL LIGHT HANDLE (MISCELLANEOUS) ×2 IMPLANT
DRSG PAD ABDOMINAL 8X10 ST (GAUZE/BANDAGES/DRESSINGS) ×2 IMPLANT
ELECT REM PT RETURN 9FT ADLT (ELECTROSURGICAL)
ELECTRODE REM PT RTRN 9FT ADLT (ELECTROSURGICAL) IMPLANT
GAUZE PACKING IODOFORM 1 (PACKING) IMPLANT
GAUZE PACKING IODOFORM 1/2 (PACKING) ×4 IMPLANT
GAUZE SPONGE 4X4 16PLY XRAY LF (GAUZE/BANDAGES/DRESSINGS) ×2 IMPLANT
GLOVE BIOGEL PI IND STRL 7.0 (GLOVE) ×1 IMPLANT
GLOVE BIOGEL PI IND STRL 8 (GLOVE) ×1 IMPLANT
GLOVE BIOGEL PI INDICATOR 7.0 (GLOVE) ×1
GLOVE BIOGEL PI INDICATOR 8 (GLOVE) ×1
GLOVE SS BIOGEL STRL SZ 7.5 (GLOVE) ×1 IMPLANT
GLOVE SUPERSENSE BIOGEL SZ 7.5 (GLOVE) ×1
GOWN STRL NON-REIN LRG LVL3 (GOWN DISPOSABLE) ×2 IMPLANT
GOWN STRL REIN XL XLG (GOWN DISPOSABLE) ×2 IMPLANT
KIT BASIN OR (CUSTOM PROCEDURE TRAY) ×2 IMPLANT
KIT ROOM TURNOVER OR (KITS) ×2 IMPLANT
NS IRRIG 1000ML POUR BTL (IV SOLUTION) ×2 IMPLANT
PACK LITHOTOMY IV (CUSTOM PROCEDURE TRAY) ×2 IMPLANT
PAD ARMBOARD 7.5X6 YLW CONV (MISCELLANEOUS) ×4 IMPLANT
PENCIL BUTTON HOLSTER BLD 10FT (ELECTRODE) IMPLANT
SPONGE GAUZE 4X4 12PLY (GAUZE/BANDAGES/DRESSINGS) ×2 IMPLANT
SUT VIC AB 3-0 SH 18 (SUTURE) ×2 IMPLANT
SWAB COLLECTION DEVICE MRSA (MISCELLANEOUS) IMPLANT
SYR BULB IRRIGATION 50ML (SYRINGE) ×2 IMPLANT
SYR CONTROL 10ML LL (SYRINGE) ×2 IMPLANT
TOWEL OR 17X24 6PK STRL BLUE (TOWEL DISPOSABLE) ×2 IMPLANT
TOWEL OR 17X26 10 PK STRL BLUE (TOWEL DISPOSABLE) ×2 IMPLANT
TUBE ANAEROBIC SPECIMEN COL (MISCELLANEOUS) IMPLANT
TUBE CONNECTING 12X1/4 (SUCTIONS) ×2 IMPLANT
UNDERPAD 30X30 INCONTINENT (UNDERPADS AND DIAPERS) ×2 IMPLANT
WATER STERILE IRR 1000ML POUR (IV SOLUTION) IMPLANT
YANKAUER SUCT BULB TIP NO VENT (SUCTIONS) ×2 IMPLANT

## 2011-08-13 NOTE — Anesthesia Preprocedure Evaluation (Addendum)
Anesthesia Evaluation  Patient identified by MRN, date of birth, ID band Patient awake    Reviewed: Allergy & Precautions, H&P , NPO status , Patient's Chart, lab work & pertinent test results  Airway Mallampati: I TM Distance: >3 FB Neck ROM: full    Dental  (+) Teeth Intact and Dental Advisory Given   Pulmonary          Cardiovascular Rhythm:regular Rate:Normal     Neuro/Psych    GI/Hepatic   Endo/Other    Renal/GU      Musculoskeletal   Abdominal   Peds  Hematology   Anesthesia Other Findings   Reproductive/Obstetrics                          Anesthesia Physical Anesthesia Plan  ASA: II  Anesthesia Plan: General   Post-op Pain Management:    Induction: Intravenous  Airway Management Planned: LMA  Additional Equipment:   Intra-op Plan:   Post-operative Plan: Extubation in OR  Informed Consent: I have reviewed the patients History and Physical, chart, labs and discussed the procedure including the risks, benefits and alternatives for the proposed anesthesia with the patient or authorized representative who has indicated his/her understanding and acceptance.   Dental advisory given  Plan Discussed with: CRNA, Anesthesiologist and Surgeon  Anesthesia Plan Comments:        Anesthesia Quick Evaluation

## 2011-08-13 NOTE — Progress Notes (Signed)
Back from OR, sleepy but arousable, VSS, will continue to monitor.

## 2011-08-13 NOTE — Anesthesia Procedure Notes (Signed)
Procedure Name: LMA Insertion Date/Time: 08/13/2011 12:22 PM Performed by: Romie Minus K Pre-anesthesia Checklist: Patient identified, Emergency Drugs available, Suction available, Patient being monitored and Timeout performed Patient Re-evaluated:Patient Re-evaluated prior to inductionOxygen Delivery Method: Circle system utilized Preoxygenation: Pre-oxygenation with 100% oxygen Intubation Type: IV induction Ventilation: Mask ventilation without difficulty LMA: LMA with gastric port inserted LMA Size: 4.0 Number of attempts: 1 Tube secured with: Tape Dental Injury: Teeth and Oropharynx as per pre-operative assessment

## 2011-08-13 NOTE — Preoperative (Signed)
Beta Blockers   Reason not to administer Beta Blockers:Not Applicable 

## 2011-08-13 NOTE — Transfer of Care (Signed)
Immediate Anesthesia Transfer of Care Note  Patient: Todd Vincent  Procedure(s) Performed: Procedure(s) (LRB): IRRIGATION AND DEBRIDEMENT PERIRECTAL ABSCESS (N/A)  Patient Location: PACU  Anesthesia Type: General  Level of Consciousness: awake  Airway & Oxygen Therapy: Patient Spontanous Breathing and Patient connected to nasal cannula oxygen  Post-op Assessment: Report given to PACU RN and Post -op Vital signs reviewed and stable  Post vital signs: Reviewed and stable  Complications: No apparent anesthesia complications

## 2011-08-13 NOTE — Op Note (Signed)
preop diagnosis: Status post I&D perirectal abscess  Postop diagnosis: Same  Procedure: Wound packing change under anesthesia  Description of procedure: Patient is 36 hours after incision and drainage of an extensive supralevator horseshoe perirectal abscess. He clinically is much improved. I recommended initial packing change under anesthesia due to the deep and extensive nature of the abscess. The patient is brought to the operating room and placed in the supine position on the operating table and laryngeal mask anesthesia induced. He was already on broad-spectrum antibiotics. He was placed in lithotomy position and the perineum sterilely prepped and draped. Patient timeout was performed. The packing was removed. There was one bleeding area from the edge of the lead in her muscles medially that was suture ligated with 3-0 Vicryl. The abscess cavity posterior and left lateral to the rectum was extensively irrigated. The wound was quite clean without stool or significant purulence. The soft tissue was infiltrated with 30 cc of Marcaine with epinephrine. The abscess cavity and subcutaneous tissue were then very loosely packed with 1/2 inch iodoform gauze. Bulky clean gauze dressing was placed and the patient taken to recovery room in good condition.  \ Mariella Saa MD, FACS  08/13/2011, 12:48 PM

## 2011-08-13 NOTE — Anesthesia Postprocedure Evaluation (Signed)
  Anesthesia Post-op Note  Patient: Todd Vincent  Procedure(s) Performed: Procedure(s) (LRB): IRRIGATION AND DEBRIDEMENT PERIRECTAL ABSCESS (N/A)  Patient Location: PACU  Anesthesia Type: General  Level of Consciousness: awake, alert  and oriented  Airway and Oxygen Therapy: Patient Spontanous Breathing  Post-op Pain: mild  Post-op Assessment: Post-op Vital signs reviewed, Patient's Cardiovascular Status Stable, Respiratory Function Stable, Patent Airway, No signs of Nausea or vomiting and Pain level controlled  Post-op Vital Signs: Reviewed and stable  Complications: No apparent anesthesia complications

## 2011-08-14 LAB — CULTURE, ROUTINE-ABSCESS

## 2011-08-14 MED ORDER — HYDROCODONE-ACETAMINOPHEN 5-325 MG PO TABS: 1.0000 | ORAL_TABLET | Freq: Four times a day (QID) | ORAL | Status: AC | PRN

## 2011-08-14 MED ORDER — SULFAMETHOXAZOLE-TRIMETHOPRIM 800-160 MG PO TABS: 1.0000 | ORAL_TABLET | Freq: Two times a day (BID) | ORAL | Status: AC

## 2011-08-14 NOTE — Discharge Summary (Signed)
Todd Vincent, Todd Vincent              ACCOUNT NO.:  [id number]  MEDICAL RECORD NO.:  [id number]  LOCATION:  5125                         FACILITY:  [facility]  PHYSICIAN:  Sandria Bales. Ezzard Standing, M.D.  DATE OF BIRTH:  Apr 20, 1976  DATE OF ADMISSION:  08/11/2011 DATE OF DISCHARGE:  08/14/2011                              DISCHARGE SUMMARY  DISCHARGE DIAGNOSES: 1. Supralevator horseshoe perirectal abscess. 2. 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 3. History of anal condylomata.  OPERATIONS PERFORMED:  The patient underwent an incision and drainage of perirectal abscess by Dr. Jaclynn Guarneri on August 11, 2011 and August 13, 2011.  HISTORY OF PRESENT ILLNESS:  This is a 36 year old [place] American male, who is HIV positive, but has moved between [place] and [place], and is not on any medicines right now for his HIV.  He moved back from [place] to [place] about 1 week ago, started developing perirectal pain though he has had some chronic "hemorrhoids".  The symptoms have not changed the nature of his bowels.  He came to the [facility] Emergency Room where a CT scan revealed a perirectal horseshoe-shaped abscess.  He was taken to the operating room on August 11, 2011, by Dr. Jaclynn Guarneri, underwent incision and drainage of his perirectal abscess.  He was taken back to the operating room on August 13, 2011, by Dr. Johna Sheriff for repeat incision and drainage, where things were much better on the second I and D.  Today, he has about a 3-cm draining wound on his left lateral rectum in which I removed the packing.  It looks like the abscess is well drained.  He is afebrile with a temperature 97.7 and pulse of 68.  His last white blood count on August 12, 2011, was 11,700 with a hemoglobin 12.4.  He appears to be ready for discharge.  I reviewed with him his instructions: 1. He can eat a regular diet. 2. He does not have a job to get back to. 3. He is  to do sitz baths 3-4 times a day.  He does have a bathtub at     home, to soak 15 minutes each time until seen back by Dr. Johna Sheriff. 4.  I will give him Septra DS 1 tablet twice a day as antibiotic and     Vicodin for pain. 5.  He should see Dr. Johna Sheriff back in about 7 to 14 days.  Special note:  I stressed to him that he needs to be in touch with the medical physicians here.  He has seen Dr. Darlina Sicilian before, but he is unsure of when he last saw him.   He is going to talk to the medicine clinic about being seen either in the Internal Medicine Clinic or by Dr. Maurice March to consider getting back on HIV medicines, if he needs them.   Sandria Bales. Ezzard Standing, M.D.. FACS   DHN/MEDQ  D:  08/14/2011  T:  08/14/2011  Job:  [id number]

## 2011-08-14 NOTE — Progress Notes (Signed)
General Surgery Note  LOS: 3 days  POD# 3 and 1  Assessment/Plan: 1.  IRRIGATION AND DEBRIDEMENT PERIRECTAL ABSCESS - B. Hoxworth - 3/26 and 08/13/2011.  On Zosyn.  Gram positive cocci and gram neg rods seen, no ID.  Ready for D/C.  Instructions reviewed.  D/C dictated:  #[id number]  2.  HIV positive x 4 years. 3.  Anal condyloma  Subjective:  Doing better.  Okay about going home. Objective:   Filed Vitals:   08/14/11 0620  BP: 98/57  Pulse: 68  Temp: 97.7 F (36.5 C)  Resp: 16     Intake/Output from previous day:  03/28 0701 - 03/29 0700 In: 2852.6 [P.O.:240; I.V.:2512.6; IV Piggyback:100] Out: 2725 [Urine:2725]  Intake/Output this shift:  Total I/O In: -  Out: 150 [Urine:150]   Physical Exam:   General: Thinner AA M who is alert and oriented.    HEENT: Normal. Pupils equal. Good dentition. .   Lungs: Clear   Abdomen: Soft   Wound: Open left peri anal wound, packing removed.   Neurologic:  Grossly intact to motor and sensory function.   Psychiatric: Has normal mood and affect. Behavior is normal   Lab Results:    Basename 08/12/11 0625  WBC 11.7*  HGB 12.4*  HCT 36.7*  PLT 200    BMET  No results found for this basename: NA:2,K:2,CL:2,CO2:2,GLUCOSE:2,BUN:2,CREATININE:2,CALCIUM:2 in the last 72 hours  PT/INR  No results found for this basename: LABPROT:2,INR:2 in the last 72 hours  ABG  No results found for this basename: PHART:2,PCO2:2,PO2:2,HCO3:2 in the last 72 hours   Studies/Results:  No results found.   Anti-infectives:   Anti-infectives     Start     Dose/Rate Route Frequency Ordered Stop   08/11/11 1530  piperacillin-tazobactam (ZOSYN) IVPB 3.375 g       3.375 g 12.5 mL/hr over 240 Minutes Intravenous [facility] 08/11/11 1514 08/12/11 1530   08/11/11 1515  piperacillin-tazobactam (ZOSYN) IVPB 3.375 g       3.375 g 12.5 mL/hr over 240 Minutes Intravenous 3 times per day 08/11/11 1509            Ovidio Kin, MD,  FACS Pager: [phone],   [facility] Office: [phone] 08/14/2011

## 2011-08-14 NOTE — Discharge Instructions (Signed)
DISCHARGE INSTRUCTIONS TO PATIENT  Return to work on:  N/A  Activity:  Driving - may drive in 2 to 3 days, if you are doing well.   Lifting - No limit  Wound Care:   Sitz baths 3 to 4 times per day.  Soak 15 minutes each time.  Pack the rectal wound as best you can.  Diet:  Regular  Follow up appointment:  Call Dr. Jamse Mead office [facility]) at [phone] for an appointment in 7 to 14 days. You need to call within the next week to be seen by the Internal Medicine Dept at [facility] for your HIV.  Medications and dosages:  Resume your home medications.  You have a prescription for:  Septra and Vicodin.  Call Dr. Johna Sheriff or his office  [phone]) if you have:  Temperature greater than 100.4,  Persistent nausea and vomiting,  Severe uncontrolled pain,  Redness, tenderness, or signs of infection (pain, swelling, redness, odor or green/yellow discharge around the site),  Difficulty breathing, headache or visual disturbances,  Any other questions or concerns you may have after discharge.  In an emergency, call 911 or go to an Emergency Department at a nearby hospital.

## 2011-08-18 ENCOUNTER — Encounter (HOSPITAL_COMMUNITY): Payer: Self-pay | Admitting: General Surgery

## 2011-08-18 LAB — ANAEROBIC CULTURE

## 2011-08-20 ENCOUNTER — Telehealth (INDEPENDENT_AMBULATORY_CARE_PROVIDER_SITE_OTHER): Payer: Self-pay | Admitting: General Surgery

## 2011-08-20 ENCOUNTER — Telehealth: Payer: Self-pay | Admitting: *Deleted

## 2011-08-20 NOTE — Telephone Encounter (Signed)
States he used to be here as a pt & then moved to [place]. He is back now & wants an appt. Last OV was with Dr. Philipp Deputy in 02/2006. Came regularly for research visits.  Did not see a doctor for his HIV in [place]. Was involved with research there but has had no HIV meds since 2009. Has no insurance. Did get a physical done last OCT. Thinks that md may have tested CD4 & VL. Had rectal surgery on 3/13 with Dr. Johna Sheriff at [facility]. Discussed with Tomasita Morrow, RN. Marland Kitchen Advised pt to sign a release for [place] to get records & to have them make a referral to [place]. Pt verbalized understanding & states he will do this His # is [phone]

## 2011-08-26 ENCOUNTER — Telehealth (INDEPENDENT_AMBULATORY_CARE_PROVIDER_SITE_OTHER): Payer: Self-pay | Admitting: General Surgery

## 2011-08-31 ENCOUNTER — Telehealth (INDEPENDENT_AMBULATORY_CARE_PROVIDER_SITE_OTHER): Payer: Self-pay

## 2011-08-31 NOTE — Telephone Encounter (Addendum)
Patient called in stating his antibiotic is causing him to itch all over, he didn't have this problem with the 1st round of his Bactrim, he refilled this medication Sunday 08/30/11 and has been itching since, patient states nothing has relieved his itching. (s/p peri-rectal abscess I & D on 3/28 & 3/29) Patient has a follow up appointment for 09/02/11 @ 4:45 pm.

## 2011-08-31 NOTE — Telephone Encounter (Signed)
Patient given follow up appointment w/Dr. Johna Sheriff 09/02/11 @ 4:45 pm.

## 2011-09-02 ENCOUNTER — Encounter (INDEPENDENT_AMBULATORY_CARE_PROVIDER_SITE_OTHER): Payer: Self-pay | Admitting: General Surgery

## 2011-09-02 NOTE — Telephone Encounter (Signed)
Patient called on 08/20/11 and was given post op appt for 10/16/11, telephone message was taken but not routed for review.  Telephone message rec'd from [place] on 08/26/11 from patient requesting an earlier appointment which has been addressed and scheduled.

## 2011-09-04 ENCOUNTER — Telehealth (INDEPENDENT_AMBULATORY_CARE_PROVIDER_SITE_OTHER): Payer: Self-pay

## 2011-09-04 NOTE — Telephone Encounter (Signed)
Called Mr. Advincula with new appointment date & time for 09/10/11 @ 10:00 w/Dr. Johna Sheriff

## 2011-09-08 ENCOUNTER — Other Ambulatory Visit: Payer: Self-pay | Admitting: Internal Medicine

## 2011-09-08 ENCOUNTER — Ambulatory Visit (INDEPENDENT_AMBULATORY_CARE_PROVIDER_SITE_OTHER): Payer: Self-pay

## 2011-09-08 DIAGNOSIS — K611 Rectal abscess: Secondary | ICD-10-CM | POA: Insufficient documentation

## 2011-09-08 DIAGNOSIS — Z113 Encounter for screening for infections with a predominantly sexual mode of transmission: Secondary | ICD-10-CM

## 2011-09-08 DIAGNOSIS — B20 Human immunodeficiency virus [HIV] disease: Secondary | ICD-10-CM

## 2011-09-08 DIAGNOSIS — Z79899 Other long term (current) drug therapy: Secondary | ICD-10-CM

## 2011-09-08 LAB — COMPLETE METABOLIC PANEL WITH GFR
ALT: 27 U/L (ref 0–53)
Albumin: 4.1 g/dL (ref 3.5–5.2)
Alkaline Phosphatase: 109 U/L (ref 39–117)
CO2: 28 mEq/L (ref 19–32)
GFR, Est Non African American: >89 mL/min
Glucose, Bld: 81 mg/dL (ref 70–99)
Potassium: 4.1 mEq/L (ref 3.5–5.3)
Sodium: 141 mEq/L (ref 135–145)
Total Bilirubin: 0.4 mg/dL (ref 0.3–1.2)
Total Protein: 7.1 g/dL (ref 6.0–8.3)

## 2011-09-08 LAB — CBC WITH DIFFERENTIAL/PLATELET
Basophils Relative: 0 % (ref 0–1)
Eosinophils Absolute: 0.1 10*3/uL (ref 0.0–0.7)
Hemoglobin: 13.6 g/dL (ref 13.0–17.0)
Lymphs Abs: 1.6 10*3/uL (ref 0.7–4.0)
Monocytes Relative: 15 % — ABNORMAL HIGH (ref 3–12)
Neutro Abs: 2.9 10*3/uL (ref 1.7–7.7)
Neutrophils Relative %: 54 % (ref 43–77)
Platelets: 334 10*3/uL (ref 150–400)
RBC: 4.59 MIL/uL (ref 4.22–5.81)

## 2011-09-08 LAB — URINALYSIS
Hgb urine dipstick: NEGATIVE
Leukocytes, UA: NEGATIVE
Nitrite: NEGATIVE
Protein, ur: NEGATIVE mg/dL
Urobilinogen, UA: 0.2 mg/dL (ref 0.0–1.0)

## 2011-09-08 LAB — LIPID PANEL
HDL: 47 mg/dL (ref >39)
LDL Cholesterol: 76 mg/dL (ref 0–99)

## 2011-09-08 LAB — HEPATITIS B SURFACE ANTIBODY,QUALITATIVE: Hep B S Ab: POSITIVE — AB

## 2011-09-09 LAB — T-HELPER CELL (CD4) - (RCID CLINIC ONLY)
CD4 % Helper T Cell: 11 % — ABNORMAL LOW (ref 33–55)
CD4 T Cell Abs: 150 uL — ABNORMAL LOW (ref 400–2700)

## 2011-09-09 LAB — HEPATITIS A ANTIBODY, TOTAL: Hep A Total Ab: NEGATIVE

## 2011-09-09 LAB — HIV-1 RNA ULTRAQUANT REFLEX TO GENTYP+
HIV 1 RNA Quant: 243685 copies/mL — ABNORMAL HIGH (ref <20)
HIV-1 RNA Quant, Log: 5.39 {Log} — ABNORMAL HIGH (ref <1.30)

## 2011-09-09 LAB — HEPATITIS B CORE ANTIBODY, TOTAL: Hep B Core Total Ab: NEGATIVE

## 2011-09-10 ENCOUNTER — Encounter (INDEPENDENT_AMBULATORY_CARE_PROVIDER_SITE_OTHER): Payer: Self-pay | Admitting: General Surgery

## 2011-09-23 ENCOUNTER — Ambulatory Visit: Payer: Self-pay

## 2011-09-23 ENCOUNTER — Encounter: Payer: Self-pay | Admitting: Internal Medicine

## 2011-09-23 ENCOUNTER — Ambulatory Visit (INDEPENDENT_AMBULATORY_CARE_PROVIDER_SITE_OTHER): Payer: Self-pay | Admitting: Internal Medicine

## 2011-09-23 VITALS — BP 143/86 | HR 105 | Temp 99.6°F | Ht 67.0 in | Wt 141.8 lb

## 2011-09-23 DIAGNOSIS — B20 Human immunodeficiency virus [HIV] disease: Secondary | ICD-10-CM

## 2011-09-23 MED ORDER — EMTRICITABINE-TENOFOVIR DF 200-300 MG PO TABS: 1.0000 | ORAL_TABLET | Freq: Every day | ORAL | Status: DC

## 2011-09-23 MED ORDER — RITONAVIR 100 MG PO TABS: 100.0000 mg | ORAL_TABLET | Freq: Every day | ORAL | Status: DC

## 2011-09-23 MED ORDER — RALTEGRAVIR POTASSIUM 400 MG PO TABS: 400.0000 mg | ORAL_TABLET | Freq: Two times a day (BID) | ORAL | Status: DC

## 2011-09-23 MED ORDER — DARUNAVIR ETHANOLATE 800 MG PO TABS: 800.0000 mg | ORAL_TABLET | Freq: Every day | ORAL | Status: DC

## 2011-09-23 MED ORDER — DAPSONE 100 MG PO TABS: 100.0000 mg | ORAL_TABLET | Freq: Every day | ORAL | Status: DC

## 2011-09-23 NOTE — Patient Instructions (Signed)
Return for labs about 3-4 weeks after you start your medicines

## 2011-09-23 NOTE — Assessment & Plan Note (Signed)
Based on his resistance dictations that are archived, I will start him on Isentress, Truvada and Prezista with Norvir. Prescriptions given today in patient will start as soon as available. He also was encouraged to start dapsone for PCP prophylaxis. He will return 1-2 weeks after getting his labs on the medication.

## 2011-09-23 NOTE — Progress Notes (Signed)
  Subjective:    Patient ID: Todd Vincent, male    DOB: Sep 17, 1975, 36 y.o.   MRN: [id number]  HPI This patient is here to reestablish care for his HIV. He was last seen in this clinic in 2008 and was under study protocol but moved to [place]. He did have significant resistance at that time and had problems establishing a regimen there to 2 nausea and fell out of care. He comes here now today interested in reestablishing care for his HIV. He does have archived resistance mutations  From 2008. He has had recent issues with perirectal abscess and also is complaining of hemorrhoid pain today. He does have a stool softener and suppository. He denies any recent new sexually transmitted infections.   Review of Systems  Constitutional: Negative for fever, chills, fatigue and unexpected weight change.  HENT: Negative for sore throat and trouble swallowing.   Respiratory: Negative for cough, shortness of breath and wheezing.   Cardiovascular: Negative for chest pain, palpitations and leg swelling.  Gastrointestinal: Negative for nausea, abdominal pain and diarrhea.  Genitourinary:       He has rectal pain mainly due to 2 hemorrhoids  Musculoskeletal: Negative for myalgias, joint swelling and arthralgias.  Skin: Negative for rash.  Hematological: Negative for adenopathy.  Psychiatric/Behavioral: Negative for dysphoric mood. The patient is not nervous/anxious.        Objective:   Physical Exam  Constitutional: He appears well-developed and well-nourished. No distress.  HENT:  Mouth/Throat: Oropharynx is clear and moist. No oropharyngeal exudate.  Cardiovascular: Normal rate, regular rhythm and normal heart sounds.  Exam reveals no gallop and no friction rub.   No murmur heard. Pulmonary/Chest: Effort normal and breath sounds normal. No respiratory distress. He has no wheezes. He has no rales.  Abdominal: Soft. Bowel sounds are normal. He exhibits no distension. There is no tenderness. There is  no rebound.  Lymphadenopathy:    He has no cervical adenopathy.  Skin: Skin is warm and dry. No rash noted. No erythema.  Psychiatric: He has a normal mood and affect. His behavior is normal.          Assessment & Plan:

## 2011-09-28 NOTE — Progress Notes (Signed)
Pt has been out of care for HIV for at least 4 1/2 years.  He was previously a study patient with [facility] research.

## 2011-10-05 ENCOUNTER — Other Ambulatory Visit: Payer: Self-pay | Admitting: *Deleted

## 2011-10-05 DIAGNOSIS — B20 Human immunodeficiency virus [HIV] disease: Secondary | ICD-10-CM

## 2011-10-05 DIAGNOSIS — Z720 Tobacco use: Secondary | ICD-10-CM

## 2011-10-05 MED ORDER — RALTEGRAVIR POTASSIUM 400 MG PO TABS: 400.0000 mg | ORAL_TABLET | Freq: Two times a day (BID) | ORAL | Status: DC

## 2011-10-05 MED ORDER — DAPSONE 100 MG PO TABS: 100.0000 mg | ORAL_TABLET | Freq: Every day | ORAL | Status: DC

## 2011-10-05 MED ORDER — DARUNAVIR ETHANOLATE 800 MG PO TABS: 800.0000 mg | ORAL_TABLET | Freq: Every day | ORAL | Status: DC

## 2011-10-05 MED ORDER — RITONAVIR 100 MG PO TABS: 100.0000 mg | ORAL_TABLET | Freq: Every day | ORAL | Status: DC

## 2011-10-05 MED ORDER — EMTRICITABINE-TENOFOVIR DF 200-300 MG PO TABS: 1.0000 | ORAL_TABLET | Freq: Every day | ORAL | Status: DC

## 2011-10-05 MED ORDER — VARENICLINE TARTRATE 0.5 MG X 11 & 1 MG X 42 PO MISC: ORAL | Status: DC

## 2011-10-16 ENCOUNTER — Encounter (INDEPENDENT_AMBULATORY_CARE_PROVIDER_SITE_OTHER): Payer: Self-pay | Admitting: General Surgery

## 2011-11-09 ENCOUNTER — Other Ambulatory Visit (INDEPENDENT_AMBULATORY_CARE_PROVIDER_SITE_OTHER): Payer: Self-pay

## 2011-11-09 DIAGNOSIS — B20 Human immunodeficiency virus [HIV] disease: Secondary | ICD-10-CM

## 2011-11-09 LAB — CBC WITH DIFFERENTIAL/PLATELET
Eosinophils Absolute: 0.2 10*3/uL (ref 0.0–0.7)
Hemoglobin: 13.1 g/dL (ref 13.0–17.0)
Lymphocytes Relative: 33 % (ref 12–46)
Lymphs Abs: 1.8 10*3/uL (ref 0.7–4.0)
MCH: 30.3 pg (ref 26.0–34.0)
Neutro Abs: 3.1 10*3/uL (ref 1.7–7.7)
Neutrophils Relative %: 55 % (ref 43–77)
Platelets: 337 10*3/uL (ref 150–400)
RBC: 4.32 MIL/uL (ref 4.22–5.81)
WBC: 5.6 10*3/uL (ref 4.0–10.5)

## 2011-11-09 LAB — COMPREHENSIVE METABOLIC PANEL
ALT: 13 U/L (ref 0–53)
Albumin: 4 g/dL (ref 3.5–5.2)
CO2: 28 mEq/L (ref 19–32)
Potassium: 4.3 mEq/L (ref 3.5–5.3)
Sodium: 136 mEq/L (ref 135–145)
Total Bilirubin: 0.3 mg/dL (ref 0.3–1.2)
Total Protein: 6.6 g/dL (ref 6.0–8.3)

## 2011-11-10 LAB — HIV-1 RNA QUANT-NO REFLEX-BLD
HIV 1 RNA Quant: 146 copies/mL — ABNORMAL HIGH (ref <20)
HIV-1 RNA Quant, Log: 2.16 {Log} — ABNORMAL HIGH (ref <1.30)

## 2011-11-10 LAB — T-HELPER CELL (CD4) - (RCID CLINIC ONLY): CD4 T Cell Abs: 160 uL — ABNORMAL LOW (ref 400–2700)

## 2011-11-24 ENCOUNTER — Ambulatory Visit (INDEPENDENT_AMBULATORY_CARE_PROVIDER_SITE_OTHER): Payer: Self-pay | Admitting: Internal Medicine

## 2011-11-24 ENCOUNTER — Encounter: Payer: Self-pay | Admitting: Internal Medicine

## 2011-11-24 VITALS — BP 151/79 | HR 79 | Temp 97.9°F | Ht 67.0 in | Wt 143.0 lb

## 2011-11-24 DIAGNOSIS — Z23 Encounter for immunization: Secondary | ICD-10-CM

## 2011-11-24 DIAGNOSIS — B20 Human immunodeficiency virus [HIV] disease: Secondary | ICD-10-CM

## 2011-11-24 DIAGNOSIS — Z21 Asymptomatic human immunodeficiency virus [HIV] infection status: Secondary | ICD-10-CM

## 2011-11-24 NOTE — Progress Notes (Signed)
  Subjective:    Patient ID: Todd Vincent, male    DOB: [birth year], 36 y.o.   MRN: [id number]  HPI He comes in for followup of his HIV. He has been seen once since reestablishing care. He does have significant resistance mutations that are archived as well as recent and he was started on a regimen of Prezista, Norvir, Truvada and Isentress.  He endorses excellent compliance in no missed doses. He is having no new complaints.   Review of Systems  Constitutional: Negative for fever, chills, activity change, fatigue and unexpected weight change.  HENT: Negative for sore throat and trouble swallowing.   Respiratory: Negative for cough and shortness of breath.   Cardiovascular: Negative for chest pain, palpitations and leg swelling.  Gastrointestinal: Negative for nausea, abdominal pain and diarrhea.  Musculoskeletal: Negative for myalgias, joint swelling and arthralgias.  Skin: Negative for rash.  Neurological: Negative for dizziness and headaches.  Hematological: Negative for adenopathy.  Psychiatric/Behavioral: Negative for dysphoric mood. The patient is not nervous/anxious.        Objective:   Physical Exam  Constitutional: He appears well-developed and well-nourished. No distress.  HENT:  Mouth/Throat: Oropharynx is clear and moist. No oropharyngeal exudate.  Cardiovascular: Normal rate, regular rhythm and normal heart sounds.  Exam reveals no gallop and no friction rub.   No murmur heard. Pulmonary/Chest: Effort normal and breath sounds normal. No respiratory distress. He has no wheezes. He has no rales.  Abdominal: Soft. Bowel sounds are normal. He exhibits no distension. There is no tenderness. There is no rebound.  Lymphadenopathy:    He has no cervical adenopathy.          Assessment & Plan:

## 2011-11-24 NOTE — Assessment & Plan Note (Addendum)
He is doing well on his regimen and his virus is less than 200 copies. His T-cell count has increased modestly to 160. He will be continued to be monitored closely.  He is pleased with the regimen and has no complaints. He knows to use condoms. He also will be referred to primary physician do to his notable hypertension. He also has had some complaints of extremity edema.  He will return in 3 months for followup

## 2012-01-04 ENCOUNTER — Telehealth: Payer: Self-pay | Admitting: *Deleted

## 2012-01-04 NOTE — Telephone Encounter (Signed)
Patient was given appt at IM, but after multiple voice mails left with him, he did not return my call, so the appt was cancelled. Todd Vincent

## 2012-01-07 ENCOUNTER — Ambulatory Visit: Payer: Self-pay

## 2012-01-11 ENCOUNTER — Encounter: Payer: Self-pay | Admitting: Internal Medicine

## 2012-02-11 ENCOUNTER — Other Ambulatory Visit (INDEPENDENT_AMBULATORY_CARE_PROVIDER_SITE_OTHER): Payer: Self-pay

## 2012-02-11 DIAGNOSIS — B20 Human immunodeficiency virus [HIV] disease: Secondary | ICD-10-CM

## 2012-02-11 DIAGNOSIS — Z21 Asymptomatic human immunodeficiency virus [HIV] infection status: Secondary | ICD-10-CM

## 2012-02-11 LAB — COMPLETE METABOLIC PANEL WITH GFR
CO2: 30 mEq/L (ref 19–32)
Creat: 1.03 mg/dL (ref 0.50–1.35)
GFR, Est African American: >89 mL/min
GFR, Est Non African American: >89 mL/min
Glucose, Bld: 94 mg/dL (ref 70–99)
Total Bilirubin: 0.6 mg/dL (ref 0.3–1.2)

## 2012-02-11 LAB — CBC WITH DIFFERENTIAL/PLATELET
Basophils Relative: 0 % (ref 0–1)
Eosinophils Absolute: 0.1 10*3/uL (ref 0.0–0.7)
Eosinophils Relative: 1 % (ref 0–5)
Lymphs Abs: 2.1 10*3/uL (ref 0.7–4.0)
MCH: 32.2 pg (ref 26.0–34.0)
MCHC: 33.3 g/dL (ref 30.0–36.0)
MCV: 96.5 fL (ref 78.0–100.0)
Monocytes Relative: 11 % (ref 3–12)
Platelets: 224 10*3/uL (ref 150–400)
RBC: 3.98 MIL/uL — ABNORMAL LOW (ref 4.22–5.81)

## 2012-02-12 LAB — T-HELPER CELL (CD4) - (RCID CLINIC ONLY): CD4 % Helper T Cell: 12 % — ABNORMAL LOW (ref 33–55)

## 2012-02-25 ENCOUNTER — Ambulatory Visit: Payer: Self-pay | Admitting: Internal Medicine

## 2012-03-22 ENCOUNTER — Encounter: Payer: Self-pay | Admitting: Internal Medicine

## 2012-03-22 ENCOUNTER — Ambulatory Visit (INDEPENDENT_AMBULATORY_CARE_PROVIDER_SITE_OTHER): Payer: Self-pay | Admitting: Internal Medicine

## 2012-03-22 VITALS — BP 136/82 | HR 84 | Temp 97.8°F | Ht 67.0 in | Wt 147.0 lb

## 2012-03-22 DIAGNOSIS — F17200 Nicotine dependence, unspecified, uncomplicated: Secondary | ICD-10-CM

## 2012-03-22 DIAGNOSIS — Z23 Encounter for immunization: Secondary | ICD-10-CM

## 2012-03-22 DIAGNOSIS — Z720 Tobacco use: Secondary | ICD-10-CM

## 2012-03-22 DIAGNOSIS — B20 Human immunodeficiency virus [HIV] disease: Secondary | ICD-10-CM

## 2012-03-22 DIAGNOSIS — Z87891 Personal history of nicotine dependence: Secondary | ICD-10-CM | POA: Insufficient documentation

## 2012-03-22 NOTE — Assessment & Plan Note (Signed)
He is working on tobacco cessation and does have Chantix.

## 2012-03-22 NOTE — Assessment & Plan Note (Signed)
Despite multiple mutations, he is doing well and his salvage regimen and is encouraged with his progress. He will continue to take his regimen and will followup in 3 months to consider stopping his dapsone if his CD4 remains over 200. He knows to call sooner if he has any difficulty with his medications.

## 2012-03-22 NOTE — Progress Notes (Signed)
  Subjective:    Patient ID: Todd Vincent, male    DOB: 04/14/1976, 36 y.o.   MRN: [id number]  HPI He comes in for follow up of HIV. After poor compliance in [place], he has been now on a salvage regimen with Prezista, Norvir, Truvada and Isentress.  He reports excellent compliance in fact now has an undetectable viral load and a CD4 over 200. He feels very well and is pleased with his progress. He is happy and taking multivitamins as well as suicide zinc and attempting to quit smoking. He has no complaints   Review of Systems  Constitutional: Negative for fever, chills, fatigue and unexpected weight change.  HENT: Negative for trouble swallowing.   Respiratory: Negative for cough and shortness of breath.   Cardiovascular: Negative for palpitations and leg swelling.  Gastrointestinal: Negative for nausea, abdominal pain and diarrhea.  Musculoskeletal: Negative for myalgias, joint swelling and arthralgias.  Skin: Negative for rash.  Neurological: Negative for dizziness, light-headedness and headaches.       Objective:   Physical Exam  Constitutional: He appears well-developed and well-nourished. No distress.  Cardiovascular: Normal rate, regular rhythm and normal heart sounds.  Exam reveals no gallop and no friction rub.   No murmur heard. Pulmonary/Chest: Effort normal and breath sounds normal. No respiratory distress. He has no wheezes. He has no rales.  Abdominal: Soft. Bowel sounds are normal. He exhibits no distension. There is no tenderness. There is no rebound.          Assessment & Plan:

## 2012-04-12 ENCOUNTER — Other Ambulatory Visit: Payer: Self-pay | Admitting: Licensed Clinical Social Worker

## 2012-04-12 DIAGNOSIS — Z720 Tobacco use: Secondary | ICD-10-CM

## 2012-04-12 DIAGNOSIS — B20 Human immunodeficiency virus [HIV] disease: Secondary | ICD-10-CM

## 2012-04-12 MED ORDER — RALTEGRAVIR POTASSIUM 400 MG PO TABS: 400.0000 mg | ORAL_TABLET | Freq: Two times a day (BID) | ORAL | Status: DC

## 2012-04-12 MED ORDER — EMTRICITABINE-TENOFOVIR DF 200-300 MG PO TABS: 1.0000 | ORAL_TABLET | Freq: Every day | ORAL | Status: DC

## 2012-04-12 MED ORDER — RITONAVIR 100 MG PO TABS: 100.0000 mg | ORAL_TABLET | Freq: Every day | ORAL | Status: DC

## 2012-04-12 MED ORDER — DARUNAVIR ETHANOLATE 800 MG PO TABS: 800.0000 mg | ORAL_TABLET | Freq: Every day | ORAL | Status: DC

## 2012-04-12 MED ORDER — VARENICLINE TARTRATE 0.5 MG X 11 & 1 MG X 42 PO MISC: ORAL | Status: DC

## 2012-04-18 ENCOUNTER — Other Ambulatory Visit: Payer: Self-pay | Admitting: *Deleted

## 2012-04-18 DIAGNOSIS — B20 Human immunodeficiency virus [HIV] disease: Secondary | ICD-10-CM

## 2012-04-18 MED ORDER — DAPSONE 100 MG PO TABS: 100.0000 mg | ORAL_TABLET | Freq: Every day | ORAL | Status: DC

## 2012-05-16 ENCOUNTER — Other Ambulatory Visit: Payer: Self-pay | Admitting: *Deleted

## 2012-05-16 DIAGNOSIS — Z720 Tobacco use: Secondary | ICD-10-CM

## 2012-05-16 MED ORDER — VARENICLINE TARTRATE 0.5 MG X 11 & 1 MG X 42 PO MISC: ORAL | Status: DC

## 2012-06-10 ENCOUNTER — Other Ambulatory Visit: Payer: Self-pay | Admitting: Licensed Clinical Social Worker

## 2012-06-10 DIAGNOSIS — Z720 Tobacco use: Secondary | ICD-10-CM

## 2012-06-10 MED ORDER — VARENICLINE TARTRATE 0.5 MG X 11 & 1 MG X 42 PO MISC: ORAL | Status: DC

## 2012-07-07 ENCOUNTER — Other Ambulatory Visit: Payer: Self-pay | Admitting: Infectious Diseases

## 2012-07-07 ENCOUNTER — Ambulatory Visit: Payer: Self-pay

## 2012-07-07 ENCOUNTER — Ambulatory Visit: Payer: Self-pay | Admitting: *Deleted

## 2012-07-07 ENCOUNTER — Other Ambulatory Visit (INDEPENDENT_AMBULATORY_CARE_PROVIDER_SITE_OTHER): Payer: Self-pay

## 2012-07-07 DIAGNOSIS — Z0000 Encounter for general adult medical examination without abnormal findings: Secondary | ICD-10-CM

## 2012-07-07 DIAGNOSIS — Z23 Encounter for immunization: Secondary | ICD-10-CM

## 2012-07-07 DIAGNOSIS — B20 Human immunodeficiency virus [HIV] disease: Secondary | ICD-10-CM

## 2012-07-08 LAB — HIV-1 RNA QUANT-NO REFLEX-BLD
HIV 1 RNA Quant: <20 copies/mL (ref <20)
HIV-1 RNA Quant, Log: <1.3 {Log} (ref <1.30)

## 2012-07-20 ENCOUNTER — Other Ambulatory Visit: Payer: Self-pay | Admitting: *Deleted

## 2012-07-20 DIAGNOSIS — B20 Human immunodeficiency virus [HIV] disease: Secondary | ICD-10-CM

## 2012-07-20 MED ORDER — EMTRICITABINE-TENOFOVIR DF 200-300 MG PO TABS: 1.0000 | ORAL_TABLET | Freq: Every day | ORAL | Status: DC

## 2012-07-20 MED ORDER — DARUNAVIR ETHANOLATE 800 MG PO TABS: 800.0000 mg | ORAL_TABLET | Freq: Every day | ORAL | Status: DC

## 2012-07-20 MED ORDER — RALTEGRAVIR POTASSIUM 400 MG PO TABS: 400.0000 mg | ORAL_TABLET | Freq: Two times a day (BID) | ORAL | Status: DC

## 2012-07-20 MED ORDER — RITONAVIR 100 MG PO TABS: 100.0000 mg | ORAL_TABLET | Freq: Every day | ORAL | Status: DC

## 2012-07-20 NOTE — Telephone Encounter (Signed)
Spoke with pharmacist.  Pt had "closed out" his medications at [facility] in January, 2014 but has now asked to "open" them again.  Pharmacy needing "new" rxes called in for filling.  Verbally gave rx information.  Pt has upcoming appt w/ Dr. Luciana Axe.  Needs to make appt for ADAP re certification.  Unable to contact pt to arrange re certification.

## 2012-07-21 ENCOUNTER — Ambulatory Visit (INDEPENDENT_AMBULATORY_CARE_PROVIDER_SITE_OTHER): Payer: Self-pay | Admitting: Internal Medicine

## 2012-07-21 ENCOUNTER — Encounter: Payer: Self-pay | Admitting: Internal Medicine

## 2012-07-21 VITALS — BP 132/73 | HR 84 | Temp 98.3°F | Ht 67.0 in | Wt 145.0 lb

## 2012-07-21 DIAGNOSIS — F17200 Nicotine dependence, unspecified, uncomplicated: Secondary | ICD-10-CM

## 2012-07-21 DIAGNOSIS — Z720 Tobacco use: Secondary | ICD-10-CM

## 2012-07-21 DIAGNOSIS — B20 Human immunodeficiency virus [HIV] disease: Secondary | ICD-10-CM

## 2012-07-22 NOTE — Assessment & Plan Note (Signed)
Congratulated him on not smoking and discussed trigger avoidance.

## 2012-07-22 NOTE — Assessment & Plan Note (Signed)
He is doing well despite multiple mutations.  His CD4 though is not rising much.  Hopefully it will with more time.  He will continue dapsone in the meantime but overall risk is lower with suppressed virus.  RTC 3 months

## 2012-07-22 NOTE — Progress Notes (Signed)
  Subjective:    Patient ID: Todd Vincent, male    DOB: 03/05/1976, 37 y.o.   MRN: [id number]  HPI Here for follow up.  Previously poor compliance and developed resistance and is on salvage regimen with Isentress, darunavir, norvir and Truvada.  Tolerating well with no missed doses.  Also quit smoking and is exercising.     Review of Systems  Constitutional: Negative for fever, chills and fatigue.  HENT: Negative for sore throat and trouble swallowing.   Respiratory: Negative for shortness of breath.   Cardiovascular: Negative for chest pain.  Gastrointestinal: Negative for nausea, abdominal pain and diarrhea.  Musculoskeletal: Negative for myalgias, joint swelling and arthralgias.  Skin: Negative for rash.  Neurological: Negative for dizziness and headaches.       Objective:   Physical Exam  Constitutional: He appears well-developed and well-nourished. No distress.  HENT:  Mouth/Throat: Oropharynx is clear and moist. No oropharyngeal exudate.  Cardiovascular: Normal rate, regular rhythm and normal heart sounds.  Exam reveals no gallop and no friction rub.   No murmur heard. Pulmonary/Chest: Effort normal and breath sounds normal. No respiratory distress. He has no wheezes. He has no rales.          Assessment & Plan:

## 2012-07-25 ENCOUNTER — Other Ambulatory Visit: Payer: Self-pay | Admitting: *Deleted

## 2012-07-25 DIAGNOSIS — B20 Human immunodeficiency virus [HIV] disease: Secondary | ICD-10-CM

## 2012-07-25 MED ORDER — DAPSONE 100 MG PO TABS: 100.0000 mg | ORAL_TABLET | Freq: Every day | ORAL | Status: DC

## 2012-08-16 ENCOUNTER — Encounter: Payer: Self-pay | Admitting: *Deleted

## 2012-09-05 ENCOUNTER — Encounter: Payer: Self-pay | Admitting: *Deleted

## 2012-09-29 ENCOUNTER — Encounter: Payer: Self-pay | Admitting: Internal Medicine

## 2012-10-03 ENCOUNTER — Telehealth: Payer: Self-pay | Admitting: *Deleted

## 2012-10-03 NOTE — Telephone Encounter (Signed)
Patient called and advised that he is having swelling in his legs daily. He advised he and Dr Luciana Axe talked at his last visit and he was having the swelling 1-2 a month which he did not feel was a hugh problem at the time. He advised it is usually in the morning and last at least 3-4 hours. He is using compression stockings and lavender oil but is still swelling. He wants to be seen he is afraid it is something more serious going on and his legs are so swollen in the morning that it makes it hard to walk. Advised the patient no appt available with Dr Luciana Axe but one with Dr Drue Second for 10/06/12 at 9 am. Patient accepted the appt.

## 2012-10-06 ENCOUNTER — Encounter: Payer: Self-pay | Admitting: Internal Medicine

## 2012-10-06 ENCOUNTER — Ambulatory Visit (INDEPENDENT_AMBULATORY_CARE_PROVIDER_SITE_OTHER): Payer: Self-pay | Admitting: Internal Medicine

## 2012-10-06 ENCOUNTER — Telehealth: Payer: Self-pay | Admitting: *Deleted

## 2012-10-06 VITALS — BP 124/72 | HR 92 | Temp 98.1°F | Wt 151.0 lb

## 2012-10-06 DIAGNOSIS — A630 Anogenital (venereal) warts: Secondary | ICD-10-CM

## 2012-10-06 DIAGNOSIS — B20 Human immunodeficiency virus [HIV] disease: Secondary | ICD-10-CM

## 2012-10-06 DIAGNOSIS — C469 Kaposi's sarcoma, unspecified: Secondary | ICD-10-CM

## 2012-10-06 LAB — COMPREHENSIVE METABOLIC PANEL
ALT: 12 U/L (ref 0–53)
BUN: 12 mg/dL (ref 6–23)
CO2: 28 mEq/L (ref 19–32)
Calcium: 9.2 mg/dL (ref 8.4–10.5)
Chloride: 104 mEq/L (ref 96–112)
Creat: 1.41 mg/dL — ABNORMAL HIGH (ref 0.50–1.35)
Total Bilirubin: 0.8 mg/dL (ref 0.3–1.2)

## 2012-10-06 LAB — CBC WITH DIFFERENTIAL/PLATELET
Basophils Absolute: 0 10*3/uL (ref 0.0–0.1)
Basophils Relative: 0 % (ref 0–1)
Eosinophils Absolute: 0.1 10*3/uL (ref 0.0–0.7)
Eosinophils Relative: 1 % (ref 0–5)
HCT: 38.1 % — ABNORMAL LOW (ref 39.0–52.0)
Hemoglobin: 13.2 g/dL (ref 13.0–17.0)
Lymphocytes Relative: 25 % (ref 12–46)
Lymphs Abs: 2 10*3/uL (ref 0.7–4.0)
MCH: 31.7 pg (ref 26.0–34.0)
MCHC: 34.6 g/dL (ref 30.0–36.0)
MCV: 91.6 fL (ref 78.0–100.0)
Monocytes Absolute: 0.6 10*3/uL (ref 0.1–1.0)
Monocytes Relative: 8 % (ref 3–12)
Neutro Abs: 5.2 10*3/uL (ref 1.7–7.7)
Neutrophils Relative %: 66 % (ref 43–77)
Platelets: 256 10*3/uL (ref 150–400)
RBC: 4.16 MIL/uL — ABNORMAL LOW (ref 4.22–5.81)
RDW: 13.8 % (ref 11.5–15.5)
WBC: 8 10*3/uL (ref 4.0–10.5)

## 2012-10-06 MED ORDER — IMIQUIMOD 5 % EX CREA: TOPICAL_CREAM | CUTANEOUS | Status: DC

## 2012-10-06 NOTE — Telephone Encounter (Signed)
Requesting rx.  MD please prescribe.  Pt has ADAP.

## 2012-10-06 NOTE — Progress Notes (Signed)
[facility] NOTE  RFV: increased lower extremity swelling Subjective:    Patient ID: Todd Vincent, male    DOB: 1976/03/06, 37 y.o.   MRN: [id number]  HPI Todd Vincent  Is 37yo M with HIV, CD 4 count of 170/VL <20, on salvage regimen of RAL/ DRVr/ Truvada.  Past history of nonadherence. Tolerating well with no missed doses.   Has had dk lesions on arms and legs in jan 2013 but have been stable until 2 months ago,now having increased lesions to thigh and calf of right  And a few new lesions on left leg. He previously would have occ. That would  intermittent swelling of legs back in sept 2013, however now he is having persistent LE swelling despite decreased salt intake. He does where compression stockings. Has stopped drinking caffeine. No longer has a job where he stands on his feet for a while in the Fall, now unemployed in December.   No family history of lymphedema  Current Outpatient Prescriptions on File Prior to Visit  Medication Sig Dispense Refill  . dapsone 100 MG tablet Take 1 tablet (100 mg total) by mouth daily.  30 tablet  6  . Darunavir Ethanolate (PREZISTA) 800 MG tablet Take 1 tablet (800 mg total) by mouth daily with breakfast.  30 tablet  6  . emtricitabine-tenofovir (TRUVADA) 200-300 MG per tablet Take 1 tablet by mouth daily.  30 tablet  6  . raltegravir (ISENTRESS) 400 MG tablet Take 1 tablet (400 mg total) by mouth 2 (two) times daily.  60 tablet  6  . ritonavir (NORVIR) 100 MG TABS Take 1 tablet (100 mg total) by mouth daily with breakfast.  30 tablet  6  . ibuprofen (ADVIL,MOTRIN) 200 MG tablet Take 400 mg by mouth every 8 (eight) hours as needed. For pain       No current facility-administered medications on file prior to visit.   Active Ambulatory Problems    Diagnosis Date Noted  . HIV DISEASE 05/21/2006  . 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 HEADACHE, TENSION 10/14/2006  . EXTERNAL HEMORRHOIDS 10/13/2006  . GENITAL HERPES, HX OF 10/13/2006  . Perirectal  abscess 09/08/2011  . Tobacco abuse 03/22/2012   Resolved Ambulatory Problems    Diagnosis Date Noted  . FACIAL RASH 10/14/2006  . HX, PERSONAL, TOBACCO USE 10/13/2006   Past Medical History  Diagnosis Date  . HIV (human immunodeficiency virus infection)   . Anal condyloma   . Allergy    History  Substance Use Topics  . Smoking status: Former Smoker -- 0.30 packs/day    Types: Cigarettes    Start date: 06/23/2012  . Smokeless tobacco: Never Used     Comment: on chantix  . Alcohol Use: 0.5 oz/week    1 drink(s) per week     Comment: occasional wine   Family hx: CAD in 48s (F); HTN, grandparents deceased of heart disease. No family history of lymphadema  Review of Systems  Constitutional: Negative for fever, chills, diaphoresis, activity change, appetite change, fatigue and unexpected weight change.  HENT: Negative for congestion, sore throat, rhinorrhea, sneezing, trouble swallowing and sinus pressure.  Eyes: Negative for photophobia and visual disturbance.  Respiratory: Negative for cough, chest tightness, shortness of breath, wheezing and stridor.  Cardiovascular: Negative for chest pain, palpitations and leg swelling.  Gastrointestinal: Negative for nausea, vomiting, abdominal pain, diarrhea, constipation, blood in stool, abdominal distention and anal bleeding.  Genitourinary: Negative for dysuria, hematuria, flank pain and difficulty urinating.  Musculoskeletal: Negative  for myalgias, back pain, joint swelling, arthralgias and gait problem.  Skin: Negative for color change, pallor, rash and wound.  Neurological: Negative for dizziness, tremors, weakness and light-headedness.  Hematological: Negative for adenopathy. Does not bruise/bleed easily.  Psychiatric/Behavioral: Negative for behavioral problems, confusion, sleep disturbance, dysphoric mood, decreased concentration and agitation.       Objective:   Physical Exam BP 124/72  Pulse 92  Temp(Src) 98.1 F (36.7 C)  (Oral)  Wt 151 lb (68.493 kg)  BMI 23.64 kg/m2 Physical Exam  Constitutional:  oriented to person, place, and time.  appears well-developed and well-nourished. No distress.  HENT:  Mouth/Throat: Oropharynx is clear and moist. No oropharyngeal exudate.  Cardiovascular: Normal rate, regular rhythm and normal heart sounds. Exam reveals no gallop and no friction rub.  No murmur heard.  Pulmonary/Chest: Effort normal and breath sounds normal. No respiratory distress. He has no wheezes.  Abdominal: Soft. Bowel sounds are normal. He exhibits no distension. There is no tenderness.  Lymphadenopathy:  He has no cervical adenopathy.  Neurological: He is alert and oriented to person, place, and time.  Skin: numerous dark purple/black circular papules 1-2.5cm wide on forearms bilaterallly. Has new lesions to right inner thigh that are coalesced (NEW), mildly indurated, and scattered lesions to calves bilaterally Psychiatric:  normal mood and affect. behavior is normal.        Assessment & Plan:  hiv = will check hiv  Cd 4 count  And viral load. 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 In most cases after having viral suppression on ART,  KS often resolves. Since the lesions are expanding on his thighs in addition to having lymphedema, I am concerned that the patient may need further evaluation by oncology to see if candidate for chemotherapy once diagnosis is confirmed by skin biopsy. Will get orange card and refer to onc. Will also , have pt come to clinic to start medicaid application.  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 Luciana Axe

## 2012-10-07 LAB — HIV-1 RNA QUANT-NO REFLEX-BLD
HIV 1 RNA Quant: <20 copies/mL (ref <20)
HIV-1 RNA Quant, Log: <1.3 {Log} (ref <1.30)

## 2012-10-07 LAB — T-HELPER CELL (CD4) - (RCID CLINIC ONLY): CD4 T Cell Abs: 260 uL — ABNORMAL LOW (ref 400–2700)

## 2012-10-11 ENCOUNTER — Other Ambulatory Visit: Payer: Self-pay

## 2012-10-11 ENCOUNTER — Ambulatory Visit: Payer: Self-pay

## 2012-10-11 ENCOUNTER — Ambulatory Visit: Payer: Self-pay | Admitting: Internal Medicine

## 2012-10-25 ENCOUNTER — Ambulatory Visit (INDEPENDENT_AMBULATORY_CARE_PROVIDER_SITE_OTHER): Payer: No Typology Code available for payment source | Admitting: Internal Medicine

## 2012-10-25 ENCOUNTER — Encounter: Payer: Self-pay | Admitting: Internal Medicine

## 2012-10-25 VITALS — BP 150/79 | HR 86 | Temp 98.7°F | Ht 67.0 in | Wt 147.0 lb

## 2012-10-25 DIAGNOSIS — N289 Disorder of kidney and ureter, unspecified: Secondary | ICD-10-CM

## 2012-10-25 DIAGNOSIS — Z113 Encounter for screening for infections with a predominantly sexual mode of transmission: Secondary | ICD-10-CM

## 2012-10-25 DIAGNOSIS — R21 Rash and other nonspecific skin eruption: Secondary | ICD-10-CM

## 2012-10-25 DIAGNOSIS — B20 Human immunodeficiency virus [HIV] disease: Secondary | ICD-10-CM

## 2012-10-25 DIAGNOSIS — Z79899 Other long term (current) drug therapy: Secondary | ICD-10-CM

## 2012-10-25 NOTE — Progress Notes (Signed)
  Subjective:    Patient ID: Todd Vincent, male    DOB: 1976/02/10, 37 y.o.   MRN: [id number]  HPI He comes in for followup of his HIV. He is on a salvage regimen with Isentress, Truvada, Norvir and Prezista.  He denies any missed doses. He feels well and in fact his CD4 count has now increased to 270 with an undetectable viral load. His leg edema, lymphedema is improving and his skin lesions are improving. He denies any weight loss and overall continues to feel better.no diarrhea.   Review of Systems  Constitutional: Negative for fever and appetite change.  HENT: Negative for sore throat and trouble swallowing.   Respiratory: Negative for cough and shortness of breath.   Cardiovascular: Positive for leg swelling.       Mproved  Gastrointestinal: Negative for nausea and diarrhea.  Musculoskeletal: Negative for myalgias and arthralgias.  Skin: Positive for rash.       Improving lesion  Neurological: Negative for dizziness and headaches.  Hematological: Negative for adenopathy.       Objective:   Physical Exam  Constitutional: He appears well-developed and well-nourished. No distress.  HENT:  Mouth/Throat: No oropharyngeal exudate.  Cardiovascular: Normal rate, regular rhythm and normal heart sounds.   No murmur heard. Pulmonary/Chest: Effort normal and breath sounds normal. No respiratory distress.  Lymphadenopathy:    He has no cervical adenopathy.  Skin:  Improving lesions          Assessment & Plan:

## 2012-10-25 NOTE — Assessment & Plan Note (Signed)
He is doing much better and having better immune her constitution now. He will continue with dapsone however if it persists over 200 next visit, this can be stopped.

## 2012-10-25 NOTE — Assessment & Plan Note (Signed)
His creatinine is mildly elevated today which is new for him. I am going to check it again today to see if it is persistent or was a transient effect. If it remains elevated, this will be monitored and I will check an HLA test

## 2012-10-25 NOTE — Assessment & Plan Note (Addendum)
He has a rash and the concern is Kaposi's sarcoma. It had been worsening but now seems to be on the upswing and improving. This will be continued to monitored while on his antiretroviral therapy.  He may have had some element of iris

## 2012-10-26 LAB — BASIC METABOLIC PANEL WITH GFR
BUN: 11 mg/dL (ref 6–23)
Calcium: 9.4 mg/dL (ref 8.4–10.5)
Creat: 1.1 mg/dL (ref 0.50–1.35)
GFR, Est African American: >89 mL/min
GFR, Est Non African American: 87 mL/min
Glucose, Bld: 99 mg/dL (ref 70–99)

## 2012-11-01 ENCOUNTER — Encounter: Payer: Self-pay | Admitting: Internal Medicine

## 2012-11-01 ENCOUNTER — Telehealth: Payer: Self-pay | Admitting: *Deleted

## 2012-11-01 NOTE — Telephone Encounter (Signed)
Called patient in reference to his email about his referral. Advised him not sure if oncology will take the discount card and having a hard time finding out. Asked him if he is willing to go to [facility] and he advised yes he will go where ever we can get him seen. Told him should hear from them by end of today and will give him a call in morning either way.

## 2012-11-03 ENCOUNTER — Telehealth: Payer: Self-pay | Admitting: *Deleted

## 2012-11-03 NOTE — Telephone Encounter (Signed)
Called the cancer center to try and get the patient an appt and had to leave a message for their intake to call the office. Faxed info requested on the form to [phone] in hopes they will schedule the patient asap. Will call the patient and advise that waiting to hear from them and hopefully will be soon.  Called the patient and advised him of the info exchange and will call him as soon as I hear back.

## 2012-11-04 NOTE — Telephone Encounter (Signed)
Call from [name] at the cancer center she is just waiting for the MD to given an appt time. They will then notify the patient and this office of the date and time. Todd Vincent

## 2012-11-07 ENCOUNTER — Telehealth: Payer: Self-pay | Admitting: Hematology & Oncology

## 2012-11-07 NOTE — Telephone Encounter (Signed)
Left pt message to call and schedule appointment °

## 2012-11-07 NOTE — Telephone Encounter (Signed)
Pt aware of 6-26 appointment °

## 2012-11-10 ENCOUNTER — Ambulatory Visit (HOSPITAL_BASED_OUTPATIENT_CLINIC_OR_DEPARTMENT_OTHER): Payer: No Typology Code available for payment source | Admitting: Hematology & Oncology

## 2012-11-10 ENCOUNTER — Other Ambulatory Visit (HOSPITAL_BASED_OUTPATIENT_CLINIC_OR_DEPARTMENT_OTHER): Payer: No Typology Code available for payment source

## 2012-11-10 ENCOUNTER — Other Ambulatory Visit: Payer: No Typology Code available for payment source | Admitting: Lab

## 2012-11-10 ENCOUNTER — Ambulatory Visit: Payer: No Typology Code available for payment source

## 2012-11-10 VITALS — BP 121/65 | HR 82 | Temp 98.2°F | Resp 18 | Ht 67.0 in | Wt 148.0 lb

## 2012-11-10 DIAGNOSIS — C469 Kaposi's sarcoma, unspecified: Secondary | ICD-10-CM

## 2012-11-10 DIAGNOSIS — B20 Human immunodeficiency virus [HIV] disease: Secondary | ICD-10-CM

## 2012-11-10 LAB — COMPREHENSIVE METABOLIC PANEL
ALT: 11 U/L (ref 0–53)
Albumin: 4.2 g/dL (ref 3.5–5.2)
CO2: 29 mEq/L (ref 19–32)
Calcium: 9.2 mg/dL (ref 8.4–10.5)
Chloride: 107 mEq/L (ref 96–112)
Glucose, Bld: 93 mg/dL (ref 70–99)
Sodium: 139 mEq/L (ref 135–145)
Total Protein: 6.5 g/dL (ref 6.0–8.3)

## 2012-11-10 LAB — CBC WITH DIFFERENTIAL (CANCER CENTER ONLY)
BASO#: 0 10*3/uL (ref 0.0–0.2)
BASO%: 0.3 % (ref 0.0–2.0)
HGB: 13.8 g/dL (ref 13.0–17.1)
LYMPH%: 20.4 % (ref 14.0–48.0)
MCH: 32.1 pg (ref 28.0–33.4)
MCHC: 33.3 g/dL (ref 32.0–35.9)
MCV: 96 fL (ref 82–98)
NEUT#: 5.4 10*3/uL (ref 1.5–6.5)
NEUT%: 68.1 % (ref 40.0–80.0)

## 2012-11-10 LAB — PREALBUMIN: Prealbumin: 25.3 mg/dL (ref 17.0–34.0)

## 2012-11-10 LAB — LACTATE DEHYDROGENASE: LDH: 224 U/L (ref 94–250)

## 2012-11-10 NOTE — Progress Notes (Signed)
This office note has been dictated.

## 2012-11-11 NOTE — Progress Notes (Signed)
CC:   Todd Barefoot, MD Todd Vincent. Hoxworth, M.D.  DIAGNOSIS: 1. Likely __________ sarcoma. 2. Human immunodeficiency virus positive.  HISTORY OF PRESENT ILLNESS:  Todd Vincent is a very nice 37 year old African American gentleman.  He is homosexual.  He currently is not in an active relationship.  He was diagnosed with HIV back in early 2005.  He has had not pneumonia. He has not had meningitis.  He has had really no specific AIDS-related diagnoses.  He is followed by Dr. Luciana Axe in [place].  Todd Vincent did have surgery a year ago.  Back in March of 2013, he had a rectal abscess that was drained.  Currently, he has noticed more skin lesions.  These mostly are on his legs.  He has some on his arms.  His legs are swollen.  He has had no abdominal pain.  He has had no fever.  There has been no cough.  He has had no swallowing difficulties.  He has had no headache.  He currently is on his "cocktail" of HIV medications.  It seems as if the HIV is fairly well controlled.  I think back in May, his CD4 count was 170, viral load was less than 20.  He has not had any kind of scans done.  Again, it is felt that he needed to be seen by Oncology for further evaluation of these skin lesions.  Overall, his performance status is ECOG 1.  PAST MEDICAL HISTORY:  Remarkable for: 1. HIV. 2. Condylomata acuminatum. 3. External hemorrhoids. 4. Genital herpes.  ALLERGIES: 1. Aspirin. 2. Sulfa medications.  MEDICATIONS:  Dapsone 100 mg p.o. daily, Prezista 800 mg p.o. daily, Truvada 200/300, one p.o. daily, Isentress 400 mg p.o. b.i.d., Norvir 100 mg p.o. daily, Advil as needed.  SOCIAL HISTORY:  Remarkable for tobacco use.  He currently smokes probably about half pack per day.  He does have some marijuana use. There is no hard drug use.  He has rare alcohol use.  There are no obvious occupational exposures.  Again, he is homosexual.  He got the HIV from his partner who he is  no longer with.  FAMILY HISTORY:  Remarkable for coronary artery disease.  REVIEW OF SYSTEMS:  No additional findings noted on a 12-system review.  PHYSICAL EXAMINATION:  General:  This is a thin, but well-nourished African American gentleman in no obvious distress.  Vital signs: Temperature of 98,2, pulse 82, heart rate 18, blood pressure 121/65. Weight is 148.  Head and neck:  Normocephalic, atraumatic skull.  There are no ocular or oral lesions.  There are no palpable cervical or supraclavicular lymph nodes.  Lungs:  Clear bilaterally.  There are no rales, wheezes, or rhonchi.  Cardiac:  Regular rate and rhythm with a normal S1 and S2.  There are no murmurs, rubs or bruits.  Abdominal exam:  Soft with good bowel sounds.  There is no palpable abdominal mass.  There is no fluid wave.  There is no palpable hepatosplenomegaly. There is no inguinal adenopathy bilaterally.  Back:  Shows no tenderness over the spine, ribs, or hips.  Extremities show lymphedema in both legs.  This is brawny type of edema.  He has good pulses in his distal extremities.  Skin:  Does show these purplish papular lesions mostly on his legs but also on his arms.  Neurological:  Shows no focal neurological deficits.  LABORATORY STUDIES:  White cell count is 8, hemoglobin 13.8, hematocrit 41.4, platelet count 244.  The LDH  is 224.  BUN is 9, creatinine 1.1.  Calcium is 9.2, with an albumin of 4.2. Prealbumin is 25.  IMPRESSION:  Todd Vincent is a 37 year old African American gentleman with HIV.  One would have to suspect that he does have Kaposi's.  We will definitely need to get a biopsy of 1 of these lesions.  I did call Dr. Johna Sheriff of [facility].  Dr. Johna Sheriff has seen Todd Vincent before and did his rectal surgery last year.  Dr. Johna Sheriff will get him in a week or so.  I have to wonder if Todd Vincent does not have lymphadenopathy in the pelvis.  This might be why he has this lymphedema with his  legs.  We will go ahead and get him set up with scans.  I will do a CT of the chest, abdomen and pelvis. One would suspect that we might need to embark up on systemic therapy for him.  He has fairly extensive skin lesions with symptoms of reflective of the lymphedema.  As such, I think that it is worthwhile to consider therapy for him.  I think that if we do embark upon therapy, that I would consider single agent Doxil.  This is pretty much front line therapy for Kaposi's.  This would be fairly well tolerated.  It is a fairly standard dose of 20 mg/sq m every 3 weeks.  I will plan to have Todd Vincent come back to see [place] once we get the CT scan results and the biopsy results back.  I spent a good hour or so with Todd Vincent.  He is a real nice guy.  I would think that he should do okay with treatment if we have to "go down that road."  I spent an hour or so with Todd Vincent.  Again, he is a real nice guy.    ______________________________ Josph Macho, M.D. PRE/MEDQ  D:  11/10/2012  T:  11/11/2012  Job:  [id number]

## 2012-11-17 ENCOUNTER — Ambulatory Visit (HOSPITAL_BASED_OUTPATIENT_CLINIC_OR_DEPARTMENT_OTHER)
Admission: RE | Admit: 2012-11-17 | Discharge: 2012-11-17 | Disposition: A | Discharge status: Home / Self Care | Payer: No Typology Code available for payment source | Source: Ambulatory Visit | Attending: Hematology & Oncology | Admitting: Hematology & Oncology

## 2012-11-17 DIAGNOSIS — I7090 Unspecified atherosclerosis: Secondary | ICD-10-CM | POA: Insufficient documentation

## 2012-11-17 DIAGNOSIS — B20 Human immunodeficiency virus [HIV] disease: Secondary | ICD-10-CM

## 2012-11-17 DIAGNOSIS — C469 Kaposi's sarcoma, unspecified: Secondary | ICD-10-CM | POA: Insufficient documentation

## 2012-11-17 MED ORDER — IOHEXOL 300 MG/ML  SOLN
100.0000 mL | Freq: Once | INTRAMUSCULAR | Status: AC | PRN
  Administered 2012-11-17: 100 mL via INTRAVENOUS

## 2012-11-23 ENCOUNTER — Telehealth: Payer: Self-pay | Admitting: Hematology & Oncology

## 2012-11-23 NOTE — Telephone Encounter (Signed)
Faxed Medical Records va fax today to: [facility]       [address]       [place] [address]       Ph: [phone]       Fx: [phone]  Medical  Records requested from to present   CONSENT COPY SCANNED

## 2012-11-28 ENCOUNTER — Telehealth: Payer: Self-pay | Admitting: Hematology & Oncology

## 2012-11-28 NOTE — Telephone Encounter (Addendum)
Message copied by Cathi Roan on Mon Nov 28, 2012 11:18 AM ------      Message from: Josph Macho      Created: Fri Nov 18, 2012  7:24 AM       Please call and tell him that the CT scan dose not show any Kaposi's in his organs!!  Cindee Lame ------  11-28-12 Spoke to patient on cell phone regarding above MD message, patient states he understood and had no questions. Lupita Raider LPn

## 2012-12-01 ENCOUNTER — Ambulatory Visit: Payer: No Typology Code available for payment source

## 2012-12-01 ENCOUNTER — Ambulatory Visit (INDEPENDENT_AMBULATORY_CARE_PROVIDER_SITE_OTHER): Payer: PRIVATE HEALTH INSURANCE | Admitting: General Surgery

## 2012-12-01 ENCOUNTER — Encounter (INDEPENDENT_AMBULATORY_CARE_PROVIDER_SITE_OTHER): Payer: Self-pay | Admitting: General Surgery

## 2012-12-01 VITALS — BP 118/70 | HR 60 | Temp 97.8°F | Resp 12 | Ht 67.0 in | Wt 148.0 lb

## 2012-12-01 DIAGNOSIS — C460 Kaposi's sarcoma of skin: Secondary | ICD-10-CM

## 2012-12-01 DIAGNOSIS — C469 Kaposi's sarcoma, unspecified: Secondary | ICD-10-CM

## 2012-12-01 NOTE — Progress Notes (Signed)
History: Patient with known HIV and followed by Dr. Arlan Organ.  He has recently developed purplish skin lesions on his arms and legs with lower extremity edema and there is concern over Kaposi's sarcoma. Biopsy has been requested.  Exam: BP 118/70  Pulse 60  Temp(Src) 97.8 F (36.6 C)  Resp 12  Ht 5\' 7"  (1.702 m)  Wt 148 lb (67.132 kg)  BMI 23.17 kg/m2 General: Thin otherwise healthy-appearing African American male. Skin/extremities: Overlying some degree the arms and more extensively lower extremities are numerous slightly raised irregular purple plaques.  Assessment and plan:likely Kaposi's sarcoma. After explaining the procedure under local anesthesia a 3 mm skin and subcutaneous punch biopsy was obtained from one of the more prominent lesions on the lateral aspect of his right thigh. This information will be forwarded to Dr. Myna Hidalgo. Wound care was explained to the patient will return as needed

## 2012-12-07 ENCOUNTER — Telehealth: Payer: Self-pay | Admitting: Hematology & Oncology

## 2012-12-07 ENCOUNTER — Other Ambulatory Visit: Payer: Self-pay | Admitting: Hematology & Oncology

## 2012-12-07 NOTE — Telephone Encounter (Signed)
I called and left a message for him to call us back at [phone] or [phone] so i could discuss the results of his studies. I did say that the CT scans looked ok!!   I will need to make an appt for him.  Cindee Lame

## 2012-12-08 ENCOUNTER — Encounter: Payer: Self-pay | Admitting: Hematology & Oncology

## 2012-12-08 ENCOUNTER — Telehealth: Payer: Self-pay | Admitting: Hematology & Oncology

## 2012-12-08 ENCOUNTER — Other Ambulatory Visit: Payer: Self-pay | Admitting: Hematology & Oncology

## 2012-12-08 DIAGNOSIS — C469 Kaposi's sarcoma, unspecified: Secondary | ICD-10-CM | POA: Insufficient documentation

## 2012-12-08 HISTORY — DX: Kaposi's sarcoma, unspecified: C46.9

## 2012-12-08 NOTE — Telephone Encounter (Signed)
Left pt message with 7-30 appointment

## 2012-12-09 ENCOUNTER — Telehealth: Payer: Self-pay | Admitting: Hematology & Oncology

## 2012-12-09 NOTE — Telephone Encounter (Signed)
Pt aware of 7-29 education,7-30 2D Echo and MD and 7-31 tx

## 2012-12-13 ENCOUNTER — Other Ambulatory Visit: Payer: No Typology Code available for payment source

## 2012-12-13 ENCOUNTER — Encounter: Payer: Self-pay | Admitting: *Deleted

## 2012-12-14 ENCOUNTER — Ambulatory Visit (HOSPITAL_COMMUNITY)
Admission: RE | Admit: 2012-12-14 | Discharge: 2012-12-14 | Disposition: A | Discharge status: Home / Self Care | Payer: No Typology Code available for payment source | Source: Ambulatory Visit | Attending: Hematology & Oncology | Admitting: Hematology & Oncology

## 2012-12-14 ENCOUNTER — Ambulatory Visit (HOSPITAL_BASED_OUTPATIENT_CLINIC_OR_DEPARTMENT_OTHER): Payer: No Typology Code available for payment source | Admitting: Hematology & Oncology

## 2012-12-14 VITALS — BP 118/62 | HR 74 | Temp 98.5°F | Resp 18 | Ht 67.0 in | Wt 147.0 lb

## 2012-12-14 DIAGNOSIS — C469 Kaposi's sarcoma, unspecified: Secondary | ICD-10-CM

## 2012-12-14 DIAGNOSIS — Z01818 Encounter for other preprocedural examination: Secondary | ICD-10-CM | POA: Insufficient documentation

## 2012-12-14 DIAGNOSIS — Z5111 Encounter for antineoplastic chemotherapy: Secondary | ICD-10-CM

## 2012-12-14 MED ORDER — PROCHLORPERAZINE MALEATE 10 MG PO TABS: 10.0000 mg | ORAL_TABLET | Freq: Four times a day (QID) | ORAL | Status: DC | PRN

## 2012-12-14 MED ORDER — DEXAMETHASONE 4 MG PO TABS: 8.0000 mg | ORAL_TABLET | Freq: Two times a day (BID) | ORAL | Status: DC

## 2012-12-14 MED ORDER — ONDANSETRON HCL 8 MG PO TABS: 8.0000 mg | ORAL_TABLET | Freq: Two times a day (BID) | ORAL | Status: DC

## 2012-12-14 MED ORDER — LORAZEPAM 0.5 MG PO TABS: 0.5000 mg | ORAL_TABLET | Freq: Four times a day (QID) | ORAL | Status: DC | PRN

## 2012-12-14 NOTE — Progress Notes (Signed)
Echo Lab  2D Echocardiogram completed.  Katharina Caper Mical Brun, RDCS signed for Todd Vincent 12/14/2012 11:06 AM

## 2012-12-14 NOTE — Progress Notes (Signed)
This office note has been dictated.

## 2012-12-15 ENCOUNTER — Other Ambulatory Visit: Payer: No Typology Code available for payment source | Admitting: Lab

## 2012-12-15 ENCOUNTER — Ambulatory Visit (HOSPITAL_BASED_OUTPATIENT_CLINIC_OR_DEPARTMENT_OTHER): Payer: No Typology Code available for payment source

## 2012-12-15 ENCOUNTER — Encounter: Payer: Self-pay | Admitting: Hematology & Oncology

## 2012-12-15 VITALS — BP 116/76 | HR 77 | Temp 98.1°F | Resp 18

## 2012-12-15 DIAGNOSIS — C469 Kaposi's sarcoma, unspecified: Secondary | ICD-10-CM

## 2012-12-15 DIAGNOSIS — Z5111 Encounter for antineoplastic chemotherapy: Secondary | ICD-10-CM

## 2012-12-15 MED ORDER — ONDANSETRON 8 MG/50ML IVPB (CHCC)
8.0000 mg | Freq: Once | INTRAVENOUS | Status: AC
  Administered 2012-12-15: 8 mg via INTRAVENOUS

## 2012-12-15 MED ORDER — DOXORUBICIN HCL LIPOSOMAL CHEMO INJECTION 2 MG/ML
20.0000 mg/m2 | Freq: Once | INTRAVENOUS | Status: AC
  Administered 2012-12-15: 36 mg via INTRAVENOUS
  Filled 2012-12-15: qty 18

## 2012-12-15 MED ORDER — DEXAMETHASONE SODIUM PHOSPHATE 10 MG/ML IJ SOLN
10.0000 mg | Freq: Once | INTRAMUSCULAR | Status: AC
  Administered 2012-12-15: 10 mg via INTRAVENOUS

## 2012-12-15 MED ORDER — SODIUM CHLORIDE 0.9 % IV SOLN
Freq: Once | INTRAVENOUS | Status: AC
  Administered 2012-12-15: 10:00:00 via INTRAVENOUS

## 2012-12-15 NOTE — Patient Instructions (Signed)
Doxorubicin Liposomal injection  What is this medicine?  LIPOSOMAL DOXORUBICIN (LIP oh som al dox oh ROO bi sin) is a chemotherapy drug. This medicine is used to treat many kinds of cancer like Kaposi's sarcoma, multiple myeloma, and ovarian cancer.  This medicine may be used for other purposes; ask your health care provider or pharmacist if you have questions.  What should I tell my health care provider before I take this medicine?  They need to know if you have any of these conditions:  -blood disorders  -heart disease  -infection (especially a virus infection such as chickenpox, cold sores, or herpes)  -liver disease  -recent or ongoing radiation therapy  -an unusual or allergic reaction to doxorubicin, other chemotherapy agents, soybeans, other medicines, foods, dyes, or preservatives  -pregnant or trying to get pregnant  -breast-feeding  How should I use this medicine?  This drug is given as an infusion into a vein. It is administered in a hospital or clinic by a specially trained health care professional. If you have pain, swelling, burning or any unusual feeling around the site of your injection, tell your health care professional right away.  Talk to your pediatrician regarding the use of this medicine in children. Special care may be needed.  Overdosage: If you think you have taken too much of this medicine contact a poison control center or emergency room at once.  NOTE: This medicine is only for you. Do not share this medicine with others.  What if I miss a dose?  It is important not to miss your dose. Call your doctor or health care professional if you are unable to keep an appointment.  What may interact with this medicine?  Do not take this medicine with any of the following medications:  -zidovudine  This medicine may also interact with the following medications:  -medicines to increase blood counts like filgrastim, pegfilgrastim, sargramostim  -vaccines  Talk to your doctor or health care  professional before taking any of these medicines:  -acetaminophen  -aspirin  -ibuprofen  -ketoprofen  -naproxen  This list may not describe all possible interactions. Give your health care provider a list of all the medicines, herbs, non-prescription drugs, or dietary supplements you use. Also tell them if you smoke, drink alcohol, or use illegal drugs. Some items may interact with your medicine.  What should I watch for while using this medicine?  Your condition will be monitored carefully while you are receiving this medicine. You will need important blood work done while you are taking this medicine.  This drug may make you feel generally unwell. This is not uncommon, as chemotherapy can affect healthy cells as well as cancer cells. Report any side effects. Continue your course of treatment even though you feel ill unless your doctor tells you to stop.  Your urine may turn orange-red for a few days after your dose. This is not blood. If your urine is dark or brown, call your doctor.  In some cases, you may be given additional medicines to help with side effects. Follow all directions for their use.  Call your doctor or health care professional for advice if you get a fever (100.5 degrees F or higher), chills or sore throat, or other symptoms of a cold or flu. Do not treat yourself. This drug decreases your body's ability to fight infections. Try to avoid being around people who are sick.  This medicine may increase your risk to bruise or bleed. Call your doctor or   you are receiving this medicine. Avoid taking products that contain aspirin, acetaminophen, ibuprofen, naproxen, or ketoprofen unless instructed by your doctor. These medicines may hide a fever. Men and women of  childbearing age should use effective birth control methods while using taking this medicine. Do not become pregnant while taking this medicine. There is a potential for serious side effects to an unborn child. Talk to your health care professional or pharmacist for more information. Do not breast-feed an infant while taking this medicine. What side effects may I notice from receiving this medicine? Side effects that you should report to your doctor or health care professional as soon as possible: -allergic reactions like skin rash, itching or hives, swelling of the face, lips, or tongue -low blood counts - this medicine may decrease the number of white blood cells, red blood cells and platelets. You may be at increased risk for infections and bleeding. -signs of hand-foot syndrome - tingling or burning, redness, flaking, swelling, small blisters, or small sores on the palms of your hands or the soles of your feet -signs of infection - fever or chills, cough, sore throat, pain or difficulty passing urine -signs of decreased platelets or bleeding - bruising, pinpoint red spots on the skin, black, tarry stools, blood in the urine -signs of decreased red blood cells - unusually weak or tired, fainting spells, lightheadedness -back pain, chills, facial flushing, fever, headache, tightness in the chest or throat during the infusion -breathing problems -chest pain -fast, irregular heartbeat -mouth pain, redness, sores -pain, swelling, redness at site where injected -pain, tingling, numbness in the hands or feet -swelling of ankles, feet, or hands -vomiting Side effects that usually do not require medical attention (report to your doctor or health care professional if they continue or are bothersome): -diarrhea -hair loss -loss of appetite -nail discoloration or damage -nausea -red or watery eyes -red colored urine -stomach upset This list may not describe all possible side effects. Call your  doctor for medical advice about side effects. You may report side effects to FDA at 1-800-FDA-1088. Where should I keep my medicine? This drug is given in a hospital or clinic and will not be stored at home. NOTE: This sheet is a summary. It may not cover all possible information. If you have questions about this medicine, talk to your doctor, pharmacist, or health care provider.  2013, Elsevier/Gold Standard. (08/23/2007 5:08:57 PM)

## 2012-12-15 NOTE — Progress Notes (Signed)
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  CURRENT THERAPY:  Patient to start low-dose Doxil tomorrow.  INTERIM HISTORY:  Mr. Kane comes in for his second office visit.  We initially saw him back in June.  At that point in time, we did a staging study on him.  We did biopsies on him.  He had a biopsy of a skin lesion.  This was done on July 17th.  The pathology report ([id number]) showed Kaposi sarcoma.  We then got scans on him.  A CT of the chest, abdomen and pelvis were done.  Thankfully, there was no evidence of spread to his organs.  No lymphadenopathy was appreciated.  Because these skin lesions were progressing, I felt that we could probably move ahead with systemic therapy.  He has had __________ negative HIV titer back in May 2014.  He is negative for hepatitis B hepatitis C.  He did have an echocardiogram done today.  This showed an ejection fraction of 55%.  He feels pretty well.  The skin lesions are not bothered him too much. Not too painful.  He has had no fevers, sweats, or chills.  He has had no weight loss.  He has had no change in bowel or bladder habits.  He has had no further issues with a rectal abscess that was drained a year ago.  He continues on his combination of HIV medications.  PHYSICAL EXAMINATION:  General:  This is a well-developed, well- nourished African American gentleman in no obvious distress.  Vital signs:  Temperature of 98.5, pulse 74, respiratory rate 18, blood pressure 118/62.  Weight is 147.  Head and neck:  Normocephalic, atraumatic skull.  There are no ocular or oral lesions.  There are no palpable cervical or supraclavicular lymph nodes.  Lungs:  Clear bilaterally.  Cardiac:  Regular rate and rhythm with a normal S1 and S2. There are no murmurs, rubs or bruits.  Abdomen:  Soft.  He has good bowel sounds.  There is no fluid wave.  There is no  palpable hepatosplenomegaly.  Extremities:  Show no clubbing, cyanosis or edema. Skin:  Does show the Kaposi's lesions on his extremities.  They are nontender.  Several are plaque-like.  LABORATORY DATA:  Not done this visit.  IMPRESSION:  Mr. Blasius is a nice 37 year old gentleman with Kaposi sarcoma.  Again, he has progressive skin lesions.  I think that these lesions are too widespread to be treated with radiation therapy.  As such, I think that we can get away with low-dose Doxil.  I would give him a Doxil every 2 weeks.  I think this is reasonable.  I went over the side effects of Doxil with Mr. Stettler.  I told him to use Eucerin cream.  He has this already.  Again, we will start him tomorrow.  We should be able to see a nice response just by his physical exam.  His HIV is pretty much non-detectable, so really should not be a problem for [place].  His CD4 count is 260.  We will get him back to see [place] in about another month.    ______________________________ Josph Macho, M.D. PRE/MEDQ  D:  12/14/2012  T:  12/15/2012  Job:  [id number]

## 2012-12-16 ENCOUNTER — Encounter: Payer: Self-pay | Admitting: Oncology

## 2012-12-16 NOTE — Progress Notes (Unsigned)
24 Hour Chemotherapy Follow up Call  Todd Vincent called at home following administration of chemotherapy. Patient reports no complaints  Medications reviewed Ativan 1mg  every 3 to 4 hours as needed for nausea or vomiting   Following interventions recommended - Call office for problems or if questions arise.  Reviewed all post chemotherapy instructions with patient and when should call clinic or MD.  Patient verbalized understanding.

## 2012-12-29 ENCOUNTER — Other Ambulatory Visit: Payer: No Typology Code available for payment source | Admitting: Lab

## 2012-12-29 ENCOUNTER — Other Ambulatory Visit (HOSPITAL_BASED_OUTPATIENT_CLINIC_OR_DEPARTMENT_OTHER): Payer: No Typology Code available for payment source | Admitting: Lab

## 2012-12-29 ENCOUNTER — Ambulatory Visit: Payer: No Typology Code available for payment source

## 2012-12-29 ENCOUNTER — Ambulatory Visit (HOSPITAL_BASED_OUTPATIENT_CLINIC_OR_DEPARTMENT_OTHER): Payer: No Typology Code available for payment source

## 2012-12-29 VITALS — BP 131/69 | HR 78 | Temp 98.6°F | Resp 18

## 2012-12-29 DIAGNOSIS — Z5111 Encounter for antineoplastic chemotherapy: Secondary | ICD-10-CM

## 2012-12-29 DIAGNOSIS — C469 Kaposi's sarcoma, unspecified: Secondary | ICD-10-CM

## 2012-12-29 DIAGNOSIS — B20 Human immunodeficiency virus [HIV] disease: Secondary | ICD-10-CM

## 2012-12-29 LAB — CBC WITH DIFFERENTIAL (CANCER CENTER ONLY)
EOS%: 1.2 % (ref 0.0–7.0)
Eosinophils Absolute: 0.1 10*3/uL (ref 0.0–0.5)
LYMPH%: 25.5 % (ref 14.0–48.0)
MCH: 32.6 pg (ref 28.0–33.4)
MCHC: 33.7 g/dL (ref 32.0–35.9)
MCV: 97 fL (ref 82–98)
MONO%: 5 % (ref 0.0–13.0)
Platelets: 241 10*3/uL (ref 145–400)
RDW: 14.9 % (ref 11.1–15.7)

## 2012-12-29 LAB — CMP (CANCER CENTER ONLY)
AST: 22 U/L (ref 11–38)
Alkaline Phosphatase: 63 U/L (ref 26–84)
Glucose, Bld: 96 mg/dL (ref 73–118)
Sodium: 135 mEq/L (ref 128–145)
Total Bilirubin: 1 mg/dl (ref 0.20–1.60)
Total Protein: 6.3 g/dL — ABNORMAL LOW (ref 6.4–8.1)

## 2012-12-29 MED ORDER — DOXORUBICIN HCL LIPOSOMAL CHEMO INJECTION 2 MG/ML
20.0000 mg/m2 | Freq: Once | INTRAVENOUS | Status: AC
  Administered 2012-12-29: 36 mg via INTRAVENOUS
  Filled 2012-12-29: qty 18

## 2012-12-29 MED ORDER — SODIUM CHLORIDE 0.9 % IV SOLN
Freq: Once | INTRAVENOUS | Status: AC
  Administered 2012-12-29: 14:00:00 via INTRAVENOUS

## 2012-12-29 MED ORDER — ONDANSETRON 8 MG/50ML IVPB (CHCC)
8.0000 mg | Freq: Once | INTRAVENOUS | Status: AC
  Administered 2012-12-29: 8 mg via INTRAVENOUS

## 2012-12-29 MED ORDER — DEXAMETHASONE SODIUM PHOSPHATE 10 MG/ML IJ SOLN
10.0000 mg | Freq: Once | INTRAMUSCULAR | Status: AC
  Administered 2012-12-29: 10 mg via INTRAVENOUS

## 2012-12-29 NOTE — Patient Instructions (Addendum)
Doxorubicin Liposomal injection What is this medicine? LIPOSOMAL DOXORUBICIN (LIP oh som al dox oh ROO bi sin) is a chemotherapy drug. This medicine is used to treat many kinds of cancer like Kaposi's sarcoma, multiple myeloma, and ovarian cancer. This medicine may be used for other purposes; ask your health care provider or pharmacist if you have questions. What should I tell my health care provider before I take this medicine? They need to know if you have any of these conditions: -blood disorders -heart disease -infection (especially a virus infection such as chickenpox, cold sores, or herpes) -liver disease -recent or ongoing radiation therapy -an unusual or allergic reaction to doxorubicin, other chemotherapy agents, soybeans, other medicines, foods, dyes, or preservatives -pregnant or trying to get pregnant -breast-feeding How should I use this medicine? This drug is given as an infusion into a vein. It is administered in a hospital or clinic by a specially trained health care professional. If you have pain, swelling, burning or any unusual feeling around the site of your injection, tell your health care professional right away. Talk to your pediatrician regarding the use of this medicine in children. Special care may be needed. Overdosage: If you think you have taken too much of this medicine contact a poison control center or emergency room at once. NOTE: This medicine is only for you. Do not share this medicine with others. What if I miss a dose? It is important not to miss your dose. Call your doctor or health care professional if you are unable to keep an appointment. What may interact with this medicine? Do not take this medicine with any of the following medications: -zidovudine This medicine may also interact with the following medications: -medicines to increase blood counts like filgrastim, pegfilgrastim, sargramostim -vaccines Talk to your doctor or health care  professional before taking any of these medicines: -acetaminophen -aspirin -ibuprofen -ketoprofen -naproxen This list may not describe all possible interactions. Give your health care provider a list of all the medicines, herbs, non-prescription drugs, or dietary supplements you use. Also tell them if you smoke, drink alcohol, or use illegal drugs. Some items may interact with your medicine. What should I watch for while using this medicine? Your condition will be monitored carefully while you are receiving this medicine. You will need important blood work done while you are taking this medicine. This drug may make you feel generally unwell. This is not uncommon, as chemotherapy can affect healthy cells as well as cancer cells. Report any side effects. Continue your course of treatment even though you feel ill unless your doctor tells you to stop. Your urine may turn orange-red for a few days after your dose. This is not blood. If your urine is dark or brown, call your doctor. In some cases, you may be given additional medicines to help with side effects. Follow all directions for their use. Call your doctor or health care professional for advice if you get a fever (100.5 degrees F or higher), chills or sore throat, or other symptoms of a cold or flu. Do not treat yourself. This drug decreases your body's ability to fight infections. Try to avoid being around people who are sick. This medicine may increase your risk to bruise or bleed. Call your doctor or health care professional if you notice any unusual bleeding. Be careful brushing and flossing your teeth or using a toothpick because you may get an infection or bleed more easily. If you have any dental work done, tell your dentist   you are receiving this medicine. Avoid taking products that contain aspirin, acetaminophen, ibuprofen, naproxen, or ketoprofen unless instructed by your doctor. These medicines may hide a fever. Men and women of  childbearing age should use effective birth control methods while using taking this medicine. Do not become pregnant while taking this medicine. There is a potential for serious side effects to an unborn child. Talk to your health care professional or pharmacist for more information. Do not breast-feed an infant while taking this medicine. What side effects may I notice from receiving this medicine? Side effects that you should report to your doctor or health care professional as soon as possible: -allergic reactions like skin rash, itching or hives, swelling of the face, lips, or tongue -low blood counts - this medicine may decrease the number of white blood cells, red blood cells and platelets. You may be at increased risk for infections and bleeding. -signs of hand-foot syndrome - tingling or burning, redness, flaking, swelling, small blisters, or small sores on the palms of your hands or the soles of your feet -signs of infection - fever or chills, cough, sore throat, pain or difficulty passing urine -signs of decreased platelets or bleeding - bruising, pinpoint red spots on the skin, black, tarry stools, blood in the urine -signs of decreased red blood cells - unusually weak or tired, fainting spells, lightheadedness -back pain, chills, facial flushing, fever, headache, tightness in the chest or throat during the infusion -breathing problems -chest pain -fast, irregular heartbeat -mouth pain, redness, sores -pain, swelling, redness at site where injected -pain, tingling, numbness in the hands or feet -swelling of ankles, feet, or hands -vomiting Side effects that usually do not require medical attention (report to your doctor or health care professional if they continue or are bothersome): -diarrhea -hair loss -loss of appetite -nail discoloration or damage -nausea -red or watery eyes -red colored urine -stomach upset This list may not describe all possible side effects. Call your  doctor for medical advice about side effects. You may report side effects to FDA at 1-800-FDA-1088. Where should I keep my medicine? This drug is given in a hospital or clinic and will not be stored at home. NOTE: This sheet is a summary. It may not cover all possible information. If you have questions about this medicine, talk to your doctor, pharmacist, or health care provider.  2013, [employer]vier/Gold Standard. (08/23/2007 5:08:57 PM)  

## 2013-01-10 ENCOUNTER — Telehealth: Payer: Self-pay | Admitting: *Deleted

## 2013-01-10 NOTE — Telephone Encounter (Signed)
Pt called on 01/09/13 to report that he was seen at the end of last week for a peri-rectal abscess and temp of 102 while in [place], [place]. He stays down there with his mother starting a few days post chemo until it is time to return for his next cycle. A surgeon drained and packed it. He is having it changed at his mother's on a daily basis. He returns to the surgeon on 01-24-13 and wanted to know if he should come in for chemo on 01/11/13. Reviewed with Dr. Myna Hidalgo. To hold chemo until abscess is healed. Also encouraged him to have his appt with the surgeon moved up to see if we can get him treated before then. He verbalized understanding.

## 2013-01-11 ENCOUNTER — Ambulatory Visit: Payer: No Typology Code available for payment source | Admitting: Hematology & Oncology

## 2013-01-11 ENCOUNTER — Ambulatory Visit: Payer: No Typology Code available for payment source

## 2013-01-11 ENCOUNTER — Other Ambulatory Visit: Payer: No Typology Code available for payment source | Admitting: Lab

## 2013-01-18 ENCOUNTER — Encounter: Payer: Self-pay | Admitting: Hematology & Oncology

## 2013-01-20 ENCOUNTER — Encounter: Payer: Self-pay | Admitting: Internal Medicine

## 2013-01-20 ENCOUNTER — Other Ambulatory Visit: Payer: Self-pay | Admitting: Internal Medicine

## 2013-01-24 ENCOUNTER — Other Ambulatory Visit: Payer: Self-pay | Admitting: Internal Medicine

## 2013-01-24 ENCOUNTER — Other Ambulatory Visit (INDEPENDENT_AMBULATORY_CARE_PROVIDER_SITE_OTHER): Payer: No Typology Code available for payment source

## 2013-01-24 DIAGNOSIS — B20 Human immunodeficiency virus [HIV] disease: Secondary | ICD-10-CM

## 2013-01-24 LAB — CBC WITH DIFFERENTIAL/PLATELET
Basophils Absolute: 0 10*3/uL (ref 0.0–0.1)
Eosinophils Relative: 2 % (ref 0–5)
Lymphocytes Relative: 28 % (ref 12–46)
MCV: 96.4 fL (ref 78.0–100.0)
Neutro Abs: 2.7 10*3/uL (ref 1.7–7.7)
Neutrophils Relative %: 56 % (ref 43–77)
Platelets: 281 10*3/uL (ref 150–400)
RDW: 14.4 % (ref 11.5–15.5)
WBC: 4.8 10*3/uL (ref 4.0–10.5)

## 2013-01-24 LAB — COMPREHENSIVE METABOLIC PANEL
ALT: 13 U/L (ref 0–53)
AST: 14 U/L (ref 0–37)
Calcium: 9.4 mg/dL (ref 8.4–10.5)
Chloride: 107 mEq/L (ref 96–112)
Creat: 1.03 mg/dL (ref 0.50–1.35)

## 2013-01-25 LAB — T-HELPER CELL (CD4) - (RCID CLINIC ONLY): CD4 % Helper T Cell: 12 % — ABNORMAL LOW (ref 33–55)

## 2013-01-26 ENCOUNTER — Ambulatory Visit (HOSPITAL_BASED_OUTPATIENT_CLINIC_OR_DEPARTMENT_OTHER): Payer: No Typology Code available for payment source | Admitting: Hematology & Oncology

## 2013-01-26 ENCOUNTER — Other Ambulatory Visit (HOSPITAL_BASED_OUTPATIENT_CLINIC_OR_DEPARTMENT_OTHER): Payer: No Typology Code available for payment source | Admitting: Lab

## 2013-01-26 ENCOUNTER — Ambulatory Visit (HOSPITAL_BASED_OUTPATIENT_CLINIC_OR_DEPARTMENT_OTHER): Payer: No Typology Code available for payment source

## 2013-01-26 VITALS — BP 122/71 | HR 83 | Temp 98.0°F | Resp 16 | Ht 67.0 in | Wt 149.0 lb

## 2013-01-26 DIAGNOSIS — C469 Kaposi's sarcoma, unspecified: Secondary | ICD-10-CM

## 2013-01-26 DIAGNOSIS — B20 Human immunodeficiency virus [HIV] disease: Secondary | ICD-10-CM

## 2013-01-26 DIAGNOSIS — Z5111 Encounter for antineoplastic chemotherapy: Secondary | ICD-10-CM

## 2013-01-26 LAB — CBC WITH DIFFERENTIAL (CANCER CENTER ONLY)
BASO#: 0 10*3/uL (ref 0.0–0.2)
Eosinophils Absolute: 0.3 10*3/uL (ref 0.0–0.5)
HCT: 41.2 % (ref 38.7–49.9)
HGB: 13.6 g/dL (ref 13.0–17.1)
LYMPH#: 1.6 10*3/uL (ref 0.9–3.3)
MCHC: 33 g/dL (ref 32.0–35.9)
MONO#: 0.8 10*3/uL (ref 0.1–0.9)
NEUT%: 44.9 % (ref 40.0–80.0)
WBC: 4.9 10*3/uL (ref 4.0–10.0)

## 2013-01-26 LAB — COMPREHENSIVE METABOLIC PANEL
BUN: 6 mg/dL (ref 6–23)
CO2: 27 mEq/L (ref 19–32)
Calcium: 9.4 mg/dL (ref 8.4–10.5)
Chloride: 106 mEq/L (ref 96–112)
Creatinine, Ser: 0.89 mg/dL (ref 0.50–1.35)

## 2013-01-26 LAB — TECHNOLOGIST REVIEW CHCC SATELLITE

## 2013-01-26 MED ORDER — SODIUM CHLORIDE 0.9 % IV SOLN
Freq: Once | INTRAVENOUS | Status: AC
  Administered 2013-01-26: 10:00:00 via INTRAVENOUS

## 2013-01-26 MED ORDER — ONDANSETRON 8 MG/50ML IVPB (CHCC)
8.0000 mg | Freq: Once | INTRAVENOUS | Status: AC
  Administered 2013-01-26: 8 mg via INTRAVENOUS

## 2013-01-26 MED ORDER — DEXAMETHASONE SODIUM PHOSPHATE 10 MG/ML IJ SOLN
10.0000 mg | Freq: Once | INTRAMUSCULAR | Status: AC
  Administered 2013-01-26: 10 mg via INTRAVENOUS

## 2013-01-26 MED ORDER — DOXORUBICIN HCL LIPOSOMAL CHEMO INJECTION 2 MG/ML
20.0000 mg/m2 | Freq: Once | INTRAVENOUS | Status: AC
  Administered 2013-01-26: 36 mg via INTRAVENOUS
  Filled 2013-01-26: qty 18

## 2013-01-26 NOTE — Patient Instructions (Addendum)
[facility] Discharge Instructions for Patients Receiving Chemotherapy  Today you received the following chemotherapy agents Doxil  To help prevent nausea and vomiting after your treatment, we encourage you to take your nausea medication    If you develop nausea and vomiting that is not controlled by your nausea medication, call the clinic.   BELOW ARE SYMPTOMS THAT SHOULD BE REPORTED IMMEDIATELY:  *FEVER GREATER THAN 100.5 F  *CHILLS WITH OR WITHOUT FEVER  NAUSEA AND VOMITING THAT IS NOT CONTROLLED WITH YOUR NAUSEA MEDICATION  *UNUSUAL SHORTNESS OF BREATH  *UNUSUAL BRUISING OR BLEEDING  TENDERNESS IN MOUTH AND THROAT WITH OR WITHOUT PRESENCE OF ULCERS  *URINARY PROBLEMS  *BOWEL PROBLEMS  UNUSUAL RASH Items with * indicate a potential emergency and should be followed up as soon as possible.  Feel free to call the clinic you have any questions or concerns. The clinic phone number is [phone].    

## 2013-01-26 NOTE — Progress Notes (Signed)
This office note has been dictated.

## 2013-01-27 NOTE — Progress Notes (Signed)
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  CURRENT THERAPY:  Patient status post 2 cycles of low-dose Doxil.  INTERIM HISTORY:  Todd Vincent comes in for his followup.  Since we last saw him, he, unfortunately, had to have another abscess drained from his rectum.  This was done back on August 22nd.  He started having rectal pain and temperatures.  Thankfully, this was done under general surgery. He was placed on antibiotics afterwards.  His last dose of Doxil was back on 08/14.  We could not follow with the 3rd cycle because of this abscess.  Currently, he is not having any kind of rectal pain.  He is having no problems with bowels or bladder.  He has noticed that his skin lesions are improving.  They are not as thick and not as dark in color.  He has had no cough.  He has had no back pain.  He has had no headache. He has had some sweats.  There has been some leg swelling bilaterally.  Overall, his performance status is ECOG 0.  PHYSICAL EXAMINATION:  General:  This is a thin but well-nourished African American gentleman in no obvious distress.  Vital signs: Temperature of 98, pulse 83, respiratory rate 16, blood pressure 122/71. Weight is 149.  Head and neck:  Normocephalic, atraumatic skull.  There are no ocular or oral lesions.  There are no palpable cervical or supraclavicular lymph nodes.  Lungs:  Clear bilaterally.  Cardiac: Regular rate and rhythm with a normal S1 and S2.  He has no murmurs, rubs or bruits.  Abdomen:  Soft.  He has good bowel sounds.  There is no palpable abdominal mass.  There is no fluid wave.  There is no palpable hepatosplenomegaly.  Extremities:  Show no clubbing, cyanosis or edema. He has good range motion of his joints.  There is no joint swelling. Skin:  Shows his Kaposi lesions, which are not as dark or thick. Neurologic:  Shows no focal neurological deficits.  LABORATORY STUDIES:  White cell count is 4.9, hemoglobin  13.6, hematocrit 41.2, platelet count 247.  IMPRESSION:  Todd Vincent is a 37 year old African American gentleman.  He is human immunodeficiency virus positive.  This has been under good control.  He has cutaneous Kaposi's.  We will go ahead with his treatment today.  So far, he has only had 2 cycles of treatment.  He is responding.  As such, we will continue him on therapy.  We will plan to get him back for treatment alone in 2 weeks.  I will plan to see him back in 1 month.  I am glad that his rectal abscess is resolving.  I did take a look at it during the physical exam.  There is an open area in the left buttock, but it appears to be healing with no exudate or bleeding.    ______________________________ Josph Macho, M.D. PRE/MEDQ  D:  01/26/2013  T:  01/27/2013  Job:  [id number]

## 2013-02-07 ENCOUNTER — Encounter: Payer: Self-pay | Admitting: Internal Medicine

## 2013-02-07 ENCOUNTER — Ambulatory Visit (INDEPENDENT_AMBULATORY_CARE_PROVIDER_SITE_OTHER): Payer: No Typology Code available for payment source | Admitting: Internal Medicine

## 2013-02-07 VITALS — BP 130/90 | HR 89 | Temp 98.7°F | Ht 67.0 in | Wt 144.0 lb

## 2013-02-07 DIAGNOSIS — C469 Kaposi's sarcoma, unspecified: Secondary | ICD-10-CM

## 2013-02-07 DIAGNOSIS — Z23 Encounter for immunization: Secondary | ICD-10-CM

## 2013-02-07 DIAGNOSIS — K612 Anorectal abscess: Secondary | ICD-10-CM

## 2013-02-07 DIAGNOSIS — K611 Rectal abscess: Secondary | ICD-10-CM

## 2013-02-07 DIAGNOSIS — B20 Human immunodeficiency virus [HIV] disease: Secondary | ICD-10-CM

## 2013-02-07 NOTE — Assessment & Plan Note (Addendum)
Doing well on doxil.

## 2013-02-07 NOTE — Assessment & Plan Note (Signed)
Doing well on his salvage regimen.  RTC in 3 months.

## 2013-02-07 NOTE — Assessment & Plan Note (Signed)
resolved 

## 2013-02-07 NOTE — Progress Notes (Signed)
  Subjective:    Patient ID: Todd Vincent, male    DOB: [birth year], 37 y.o.   MRN: [id number]  HPI He comes in for routine follow up  He continues on Isentress and truvada, prezista, norvir and denies any missed doses.  He also is on doxil for Kaposi's sarcoma and is tolerating well.  His skin lesions are resolving nicely.  His CD4 is 160, down from 200 but is on chemo and decadron.  No new issues.  No weight loss, no diarrhea.  Perirectal abscess has cleared up.    Review of Systems  Constitutional: Negative for fever and chills.  HENT: Negative for sore throat and trouble swallowing.   Eyes: Negative for visual disturbance.  Respiratory: Negative for cough and shortness of breath.   Cardiovascular: Negative for chest pain.  Gastrointestinal: Negative for nausea and diarrhea.  Musculoskeletal: Negative for myalgias and arthralgias.  Skin: Positive for rash.       KS  Neurological: Negative for dizziness, light-headedness and headaches.  Hematological: Negative for adenopathy.  Psychiatric/Behavioral: Negative for dysphoric mood.       Objective:   Physical Exam  Constitutional: He appears well-developed and well-nourished. No distress.  HENT:  Mouth/Throat: No oropharyngeal exudate.  Eyes: No scleral icterus.  Cardiovascular: Normal rate, regular rhythm and normal heart sounds.   No murmur heard. Pulmonary/Chest: Effort normal and breath sounds normal. He has no wheezes.  Lymphadenopathy:    He has no cervical adenopathy.  Neurological: He is alert.  Skin: Rash noted.  KS  Psychiatric: He has a normal mood and affect. His behavior is normal.          Assessment & Plan:

## 2013-02-09 ENCOUNTER — Ambulatory Visit (HOSPITAL_BASED_OUTPATIENT_CLINIC_OR_DEPARTMENT_OTHER): Payer: No Typology Code available for payment source

## 2013-02-09 ENCOUNTER — Other Ambulatory Visit (HOSPITAL_BASED_OUTPATIENT_CLINIC_OR_DEPARTMENT_OTHER): Payer: No Typology Code available for payment source | Admitting: Lab

## 2013-02-09 VITALS — BP 118/75 | HR 71 | Temp 98.4°F

## 2013-02-09 DIAGNOSIS — C469 Kaposi's sarcoma, unspecified: Secondary | ICD-10-CM

## 2013-02-09 DIAGNOSIS — Z5111 Encounter for antineoplastic chemotherapy: Secondary | ICD-10-CM

## 2013-02-09 LAB — CBC WITH DIFFERENTIAL (CANCER CENTER ONLY)
BASO#: 0 10*3/uL (ref 0.0–0.2)
EOS%: 3.3 % (ref 0.0–7.0)
HCT: 42.6 % (ref 38.7–49.9)
HGB: 14.3 g/dL (ref 13.0–17.1)
MCH: 32.9 pg (ref 28.0–33.4)
MCHC: 33.6 g/dL (ref 32.0–35.9)
MCV: 98 fL (ref 82–98)
MONO#: 0.7 10*3/uL (ref 0.1–0.9)
MONO%: 8.8 % (ref 0.0–13.0)
NEUT%: 65.8 % (ref 40.0–80.0)
Platelets: 188 10*3/uL (ref 145–400)
RBC: 4.35 10*6/uL (ref 4.20–5.70)
WBC: 7.6 10*3/uL (ref 4.0–10.0)

## 2013-02-09 MED ORDER — SODIUM CHLORIDE 0.9 % IV SOLN
Freq: Once | INTRAVENOUS | Status: AC
  Administered 2013-02-09: 11:00:00 via INTRAVENOUS

## 2013-02-09 MED ORDER — DEXAMETHASONE SODIUM PHOSPHATE 10 MG/ML IJ SOLN
10.0000 mg | Freq: Once | INTRAMUSCULAR | Status: AC
  Administered 2013-02-09: 10 mg via INTRAVENOUS

## 2013-02-09 MED ORDER — DEXAMETHASONE SODIUM PHOSPHATE 10 MG/ML IJ SOLN
INTRAMUSCULAR | Status: AC
  Filled 2013-02-09: qty 1

## 2013-02-09 MED ORDER — DOXORUBICIN HCL LIPOSOMAL CHEMO INJECTION 2 MG/ML
20.0000 mg/m2 | Freq: Once | INTRAVENOUS | Status: AC
  Administered 2013-02-09: 36 mg via INTRAVENOUS
  Filled 2013-02-09: qty 18

## 2013-02-09 MED ORDER — ONDANSETRON 8 MG/50ML IVPB (CHCC)
8.0000 mg | Freq: Once | INTRAVENOUS | Status: AC
  Administered 2013-02-09: 8 mg via INTRAVENOUS

## 2013-02-09 NOTE — Patient Instructions (Addendum)
[facility] Discharge Instructions for Patients Receiving Chemotherapy  Today you received the following chemotherapy agents Doxil  To help prevent nausea and vomiting after your treatment, we encourage you to take your nausea medication    If you develop nausea and vomiting that is not controlled by your nausea medication, call the clinic.   BELOW ARE SYMPTOMS THAT SHOULD BE REPORTED IMMEDIATELY:  *FEVER GREATER THAN 100.5 F  *CHILLS WITH OR WITHOUT FEVER  NAUSEA AND VOMITING THAT IS NOT CONTROLLED WITH YOUR NAUSEA MEDICATION  *UNUSUAL SHORTNESS OF BREATH  *UNUSUAL BRUISING OR BLEEDING  TENDERNESS IN MOUTH AND THROAT WITH OR WITHOUT PRESENCE OF ULCERS  *URINARY PROBLEMS  *BOWEL PROBLEMS  UNUSUAL RASH Items with * indicate a potential emergency and should be followed up as soon as possible.  Feel free to call the clinic you have any questions or concerns. The clinic phone number is [phone].    

## 2013-02-15 ENCOUNTER — Other Ambulatory Visit: Payer: Self-pay | Admitting: *Deleted

## 2013-02-15 DIAGNOSIS — B20 Human immunodeficiency virus [HIV] disease: Secondary | ICD-10-CM

## 2013-02-15 MED ORDER — RITONAVIR 100 MG PO TABS: ORAL_TABLET | ORAL | Status: DC

## 2013-02-15 MED ORDER — DARUNAVIR ETHANOLATE 800 MG PO TABS: 800.0000 mg | ORAL_TABLET | Freq: Every day | ORAL | Status: DC

## 2013-02-15 MED ORDER — RALTEGRAVIR POTASSIUM 400 MG PO TABS: ORAL_TABLET | ORAL | Status: DC

## 2013-02-15 MED ORDER — EMTRICITABINE-TENOFOVIR DF 200-300 MG PO TABS: 1.0000 | ORAL_TABLET | Freq: Every day | ORAL | Status: DC

## 2013-02-16 ENCOUNTER — Encounter: Payer: Self-pay | Admitting: *Deleted

## 2013-02-16 NOTE — Progress Notes (Signed)
Medical Records faxed to Disability Determination services as requested.

## 2013-02-17 ENCOUNTER — Other Ambulatory Visit: Payer: Self-pay | Admitting: *Deleted

## 2013-02-17 DIAGNOSIS — B20 Human immunodeficiency virus [HIV] disease: Secondary | ICD-10-CM

## 2013-02-17 MED ORDER — RALTEGRAVIR POTASSIUM 400 MG PO TABS: ORAL_TABLET | ORAL | Status: DC

## 2013-02-17 MED ORDER — EMTRICITABINE-TENOFOVIR DF 200-300 MG PO TABS: 1.0000 | ORAL_TABLET | Freq: Every day | ORAL | Status: DC

## 2013-02-17 MED ORDER — RITONAVIR 100 MG PO TABS: ORAL_TABLET | ORAL | Status: DC

## 2013-02-17 MED ORDER — DARUNAVIR ETHANOLATE 800 MG PO TABS: 800.0000 mg | ORAL_TABLET | Freq: Every day | ORAL | Status: DC

## 2013-02-18 ENCOUNTER — Other Ambulatory Visit: Payer: Self-pay | Admitting: Internal Medicine

## 2013-02-18 DIAGNOSIS — B20 Human immunodeficiency virus [HIV] disease: Secondary | ICD-10-CM

## 2013-02-23 ENCOUNTER — Ambulatory Visit (HOSPITAL_BASED_OUTPATIENT_CLINIC_OR_DEPARTMENT_OTHER): Payer: No Typology Code available for payment source

## 2013-02-23 ENCOUNTER — Other Ambulatory Visit (HOSPITAL_BASED_OUTPATIENT_CLINIC_OR_DEPARTMENT_OTHER): Payer: No Typology Code available for payment source | Admitting: Lab

## 2013-02-23 ENCOUNTER — Telehealth: Payer: Self-pay | Admitting: *Deleted

## 2013-02-23 ENCOUNTER — Telehealth: Payer: Self-pay | Admitting: Hematology & Oncology

## 2013-02-23 ENCOUNTER — Ambulatory Visit (HOSPITAL_BASED_OUTPATIENT_CLINIC_OR_DEPARTMENT_OTHER): Payer: No Typology Code available for payment source | Admitting: Hematology & Oncology

## 2013-02-23 VITALS — BP 128/78 | HR 78 | Temp 98.3°F | Resp 18 | Ht 67.0 in | Wt 152.0 lb

## 2013-02-23 DIAGNOSIS — Z5111 Encounter for antineoplastic chemotherapy: Secondary | ICD-10-CM

## 2013-02-23 DIAGNOSIS — T451X5S Adverse effect of antineoplastic and immunosuppressive drugs, sequela: Secondary | ICD-10-CM

## 2013-02-23 DIAGNOSIS — C469 Kaposi's sarcoma, unspecified: Secondary | ICD-10-CM

## 2013-02-23 DIAGNOSIS — B20 Human immunodeficiency virus [HIV] disease: Secondary | ICD-10-CM

## 2013-02-23 LAB — CBC WITH DIFFERENTIAL (CANCER CENTER ONLY)
BASO%: 0.4 % (ref 0.0–2.0)
Eosinophils Absolute: 0.2 10*3/uL (ref 0.0–0.5)
HCT: 43.7 % (ref 38.7–49.9)
LYMPH%: 28.6 % (ref 14.0–48.0)
MCH: 32.8 pg (ref 28.0–33.4)
MCV: 96 fL (ref 82–98)
MONO#: 0.5 10*3/uL (ref 0.1–0.9)
NEUT%: 60 % (ref 40.0–80.0)
RDW: 14.3 % (ref 11.1–15.7)
WBC: 6.9 10*3/uL (ref 4.0–10.0)

## 2013-02-23 LAB — CMP (CANCER CENTER ONLY)
Albumin: 3.4 g/dL (ref 3.3–5.5)
CO2: 32 mEq/L (ref 18–33)
Creat: 1.1 mg/dl (ref 0.6–1.2)
Glucose, Bld: 76 mg/dL (ref 73–118)
Total Bilirubin: 0.8 mg/dl (ref 0.20–1.60)

## 2013-02-23 MED ORDER — SODIUM CHLORIDE 0.9 % IV SOLN
Freq: Once | INTRAVENOUS | Status: AC
  Administered 2013-02-23: 13:00:00 via INTRAVENOUS

## 2013-02-23 MED ORDER — DEXAMETHASONE SODIUM PHOSPHATE 10 MG/ML IJ SOLN
INTRAMUSCULAR | Status: AC
  Filled 2013-02-23: qty 1

## 2013-02-23 MED ORDER — TRIAMTERENE-HCTZ 37.5-25 MG PO TABS: ORAL_TABLET | ORAL | Status: DC

## 2013-02-23 MED ORDER — DOXORUBICIN HCL LIPOSOMAL CHEMO INJECTION 2 MG/ML
20.0000 mg/m2 | Freq: Once | INTRAVENOUS | Status: AC
  Administered 2013-02-23: 36 mg via INTRAVENOUS
  Filled 2013-02-23: qty 18

## 2013-02-23 MED ORDER — DEXAMETHASONE SODIUM PHOSPHATE 10 MG/ML IJ SOLN
10.0000 mg | Freq: Once | INTRAMUSCULAR | Status: AC
  Administered 2013-02-23: 10 mg via INTRAVENOUS

## 2013-02-23 MED ORDER — FLUCONAZOLE 100 MG PO TABS: 100.0000 mg | ORAL_TABLET | Freq: Every day | ORAL | Status: DC

## 2013-02-23 MED ORDER — ONDANSETRON 8 MG/50ML IVPB (CHCC)
8.0000 mg | Freq: Once | INTRAVENOUS | Status: AC
  Administered 2013-02-23: 8 mg via INTRAVENOUS

## 2013-02-23 NOTE — Telephone Encounter (Signed)
Faxed Medical Records via fax today  to:  [facility] Ph: [phone]  Fx: [phone]   Medical  Records requested from 05/18/2012 to present   CONSENT COPY SCANNED

## 2013-02-23 NOTE — Telephone Encounter (Signed)
Disability forms requested for patient. Dr. Luciana Axe completed them, mailed back to [employer] attn [place] [employer]ip. [address], [place], [place] [address]. Copy made for patient's chart. Andree Coss, RN

## 2013-02-23 NOTE — Patient Instructions (Signed)
[facility] Discharge Instructions for Patients Receiving Chemotherapy  Today you received the following chemotherapy agents Doxil  To help prevent nausea and vomiting after your treatment, we encourage you to take your nausea medication    If you develop nausea and vomiting that is not controlled by your nausea medication, call the clinic.   BELOW ARE SYMPTOMS THAT SHOULD BE REPORTED IMMEDIATELY:  *FEVER GREATER THAN 100.5 F  *CHILLS WITH OR WITHOUT FEVER  NAUSEA AND VOMITING THAT IS NOT CONTROLLED WITH YOUR NAUSEA MEDICATION  *UNUSUAL SHORTNESS OF BREATH  *UNUSUAL BRUISING OR BLEEDING  TENDERNESS IN MOUTH AND THROAT WITH OR WITHOUT PRESENCE OF ULCERS  *URINARY PROBLEMS  *BOWEL PROBLEMS  UNUSUAL RASH Items with * indicate a potential emergency and should be followed up as soon as possible.  Feel free to call the clinic you have any questions or concerns. The clinic phone number is [phone].    

## 2013-02-23 NOTE — Progress Notes (Signed)
This office note has been dictated.

## 2013-02-24 NOTE — Progress Notes (Signed)
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  CURRENT THERAPY:  Patient status post 4 cycles of low-dose Doxil, q.2 week dosing.  INTERIM HISTORY:  [name] comes in for his followup.  He is doing real well.  The cavitary lesions continue to improve.  They are flattening out.  They are lightening up.  He had a little bit of diarrhea recently.  He thinks this may been a little bit of a cold that he had.  He had some upper respiratory symptoms previously to this.  He says that the diarrhea is getting a little bit better.  He is not taking anything for it.  He wants to let "run its course."  The HIV has not been a problem for him.  We have been checking his titers.  So far, his most recent HIV titers that we checked back in September showed a HIV quantitative titer of 27.  He has had no fevers.  He has had no bleeding.  His anal fissure has healed up completely.  PHYSICAL EXAMINATION:  General:  This is a well-developed, well- nourished African American gentleman in no obvious distress.  Vital signs:  Temperature of 98.3, pulse 78, respiratory rate 18, blood pressure 128/78.  Weight is 152.  Head and neck:  Normocephalic, atraumatic skull.  There are no ocular or oral lesions.  There are no palpable cervical or supraclavicular lymph nodes.  Lungs:  Clear bilaterally.  Cardiac:  Regular rate and rhythm with a normal S1 and S2. There are no murmurs, rubs or bruits.  Abdomen:  Soft.  He has good bowel sounds.  There is no fluid wave.  There is no palpable hepatosplenomegaly.  Extremities:  Show some slight edema in his lower legs.  Skin:  Does show the Kaposi lesions.  They appear to be less prominent.  LABORATORY STUDIES:  White cell count 6.9, hemoglobin 14.9, hematocrit 43.7, platelet count 267,000.  IMPRESSION:  [name] is a 37 year old African American gentleman.  He has Kaposi sarcoma.  He is HIV positive.  Again, I noted that his HIV titer was  up a little bit.  We are checking it again this visit.  I will also run another echocardiogram on him.  I want to make sure that his cardiac function is holding stable.  I think if we find that if the HIV titer continues to increase, we are going to have to see about getting him back to infectious disease.  I want to see him back in another month or so.    ______________________________ Josph Macho, M.D. PRE/MEDQ  D:  02/23/2013  T:  02/24/2013  Job:  [id number]

## 2013-03-07 ENCOUNTER — Ambulatory Visit (HOSPITAL_COMMUNITY)
Admission: RE | Admit: 2013-03-07 | Discharge: 2013-03-07 | Disposition: A | Discharge status: Home / Self Care | Payer: No Typology Code available for payment source | Source: Ambulatory Visit | Attending: Hematology & Oncology | Admitting: Hematology & Oncology

## 2013-03-07 DIAGNOSIS — I517 Cardiomegaly: Secondary | ICD-10-CM

## 2013-03-07 DIAGNOSIS — T451X5S Adverse effect of antineoplastic and immunosuppressive drugs, sequela: Secondary | ICD-10-CM

## 2013-03-07 DIAGNOSIS — I498 Other specified cardiac arrhythmias: Secondary | ICD-10-CM | POA: Insufficient documentation

## 2013-03-09 ENCOUNTER — Telehealth: Payer: Self-pay | Admitting: Nurse Practitioner

## 2013-03-09 NOTE — Telephone Encounter (Addendum)
Message copied by Glee Arvin on Thu Mar 09, 2013 12:30 PM ------      Message from: Arlan Organ R      Created: Thu Mar 09, 2013  9:25 AM       Call - tell him that his heart is still pumping like a champion!!  Cindee Lame ------Spoke w/pt and relayed this information to him. He was very happy. Verbalized understanding and appreciation.

## 2013-03-10 ENCOUNTER — Other Ambulatory Visit: Payer: No Typology Code available for payment source | Admitting: Lab

## 2013-03-10 ENCOUNTER — Ambulatory Visit: Payer: No Typology Code available for payment source

## 2013-03-20 ENCOUNTER — Other Ambulatory Visit (HOSPITAL_BASED_OUTPATIENT_CLINIC_OR_DEPARTMENT_OTHER): Payer: No Typology Code available for payment source | Admitting: Lab

## 2013-03-20 ENCOUNTER — Ambulatory Visit (HOSPITAL_BASED_OUTPATIENT_CLINIC_OR_DEPARTMENT_OTHER): Payer: No Typology Code available for payment source

## 2013-03-20 VITALS — BP 130/79 | HR 66 | Temp 98.0°F | Resp 18

## 2013-03-20 DIAGNOSIS — C469 Kaposi's sarcoma, unspecified: Secondary | ICD-10-CM

## 2013-03-20 DIAGNOSIS — T451X5S Adverse effect of antineoplastic and immunosuppressive drugs, sequela: Secondary | ICD-10-CM

## 2013-03-20 DIAGNOSIS — Z5111 Encounter for antineoplastic chemotherapy: Secondary | ICD-10-CM

## 2013-03-20 LAB — COMPREHENSIVE METABOLIC PANEL
ALT: 14 U/L (ref 0–53)
Albumin: 4.6 g/dL (ref 3.5–5.2)
CO2: 29 mEq/L (ref 19–32)
Calcium: 9.8 mg/dL (ref 8.4–10.5)
Chloride: 101 mEq/L (ref 96–112)
Glucose, Bld: 130 mg/dL — ABNORMAL HIGH (ref 70–99)
Potassium: 4.3 mEq/L (ref 3.5–5.3)
Sodium: 139 mEq/L (ref 135–145)
Total Bilirubin: 0.7 mg/dL (ref 0.3–1.2)
Total Protein: 6.8 g/dL (ref 6.0–8.3)

## 2013-03-20 LAB — CBC WITH DIFFERENTIAL (CANCER CENTER ONLY)
BASO%: 0.3 % (ref 0.0–2.0)
Eosinophils Absolute: 0.3 10*3/uL (ref 0.0–0.5)
LYMPH#: 1.7 10*3/uL (ref 0.9–3.3)
LYMPH%: 30.4 % (ref 14.0–48.0)
MCH: 32.3 pg (ref 28.0–33.4)
MCV: 95 fL (ref 82–98)
MONO#: 0.5 10*3/uL (ref 0.1–0.9)
Platelets: 233 10*3/uL (ref 145–400)
RBC: 4.73 10*6/uL (ref 4.20–5.70)
RDW: 14 % (ref 11.1–15.7)
WBC: 5.7 10*3/uL (ref 4.0–10.0)

## 2013-03-20 MED ORDER — HEPARIN SOD (PORK) LOCK FLUSH 100 UNIT/ML IV SOLN
500.0000 [IU] | Freq: Once | INTRAVENOUS | Status: DC | PRN
  Filled 2013-03-20: qty 5

## 2013-03-20 MED ORDER — SODIUM CHLORIDE 0.9 % IJ SOLN
3.0000 mL | INTRAMUSCULAR | Status: DC | PRN
  Filled 2013-03-20: qty 10

## 2013-03-20 MED ORDER — ONDANSETRON 8 MG/50ML IVPB (CHCC)
8.0000 mg | Freq: Once | INTRAVENOUS | Status: AC
  Administered 2013-03-20: 8 mg via INTRAVENOUS

## 2013-03-20 MED ORDER — ALTEPLASE 2 MG IJ SOLR
2.0000 mg | Freq: Once | INTRAMUSCULAR | Status: DC | PRN
  Filled 2013-03-20: qty 2

## 2013-03-20 MED ORDER — DEXAMETHASONE SODIUM PHOSPHATE 10 MG/ML IJ SOLN
INTRAMUSCULAR | Status: AC
  Filled 2013-03-20: qty 1

## 2013-03-20 MED ORDER — SODIUM CHLORIDE 0.9 % IJ SOLN
10.0000 mL | INTRAMUSCULAR | Status: DC | PRN
  Filled 2013-03-20: qty 10

## 2013-03-20 MED ORDER — SODIUM CHLORIDE 0.9 % IV SOLN
Freq: Once | INTRAVENOUS | Status: AC
  Administered 2013-03-20: 12:00:00 via INTRAVENOUS

## 2013-03-20 MED ORDER — DOXORUBICIN HCL LIPOSOMAL CHEMO INJECTION 2 MG/ML
20.0000 mg/m2 | Freq: Once | INTRAVENOUS | Status: AC
  Administered 2013-03-20: 36 mg via INTRAVENOUS
  Filled 2013-03-20: qty 18

## 2013-03-20 MED ORDER — HEPARIN SOD (PORK) LOCK FLUSH 100 UNIT/ML IV SOLN
250.0000 [IU] | Freq: Once | INTRAVENOUS | Status: DC | PRN
  Filled 2013-03-20: qty 5

## 2013-03-20 MED ORDER — DEXAMETHASONE SODIUM PHOSPHATE 10 MG/ML IJ SOLN
10.0000 mg | Freq: Once | INTRAMUSCULAR | Status: AC
  Administered 2013-03-20: 10 mg via INTRAVENOUS

## 2013-03-20 NOTE — Patient Instructions (Signed)
Doxorubicin Liposomal injection  What is this medicine?  LIPOSOMAL DOXORUBICIN (LIP oh som al dox oh ROO bi sin) is a chemotherapy drug. This medicine is used to treat many kinds of cancer like Kaposi's sarcoma, multiple myeloma, and ovarian cancer.  This medicine may be used for other purposes; ask your health care provider or pharmacist if you have questions.  What should I tell my health care provider before I take this medicine?  They need to know if you have any of these conditions:  -blood disorders  -heart disease  -infection (especially a virus infection such as chickenpox, cold sores, or herpes)  -liver disease  -recent or ongoing radiation therapy  -an unusual or allergic reaction to doxorubicin, other chemotherapy agents, soybeans, other medicines, foods, dyes, or preservatives  -pregnant or trying to get pregnant  -breast-feeding  How should I use this medicine?  This drug is given as an infusion into a vein. It is administered in a hospital or clinic by a specially trained health care professional. If you have pain, swelling, burning or any unusual feeling around the site of your injection, tell your health care professional right away.  Talk to your pediatrician regarding the use of this medicine in children. Special care may be needed.  Overdosage: If you think you have taken too much of this medicine contact a poison control center or emergency room at once.  NOTE: This medicine is only for you. Do not share this medicine with others.  What if I miss a dose?  It is important not to miss your dose. Call your doctor or health care professional if you are unable to keep an appointment.  What may interact with this medicine?  Do not take this medicine with any of the following medications:  -zidovudine  This medicine may also interact with the following medications:  -medicines to increase blood counts like filgrastim, pegfilgrastim, sargramostim  -vaccines  Talk to your doctor or health care  professional before taking any of these medicines:  -acetaminophen  -aspirin  -ibuprofen  -ketoprofen  -naproxen  This list may not describe all possible interactions. Give your health care provider a list of all the medicines, herbs, non-prescription drugs, or dietary supplements you use. Also tell them if you smoke, drink alcohol, or use illegal drugs. Some items may interact with your medicine.  What should I watch for while using this medicine?  Your condition will be monitored carefully while you are receiving this medicine. You will need important blood work done while you are taking this medicine.  This drug may make you feel generally unwell. This is not uncommon, as chemotherapy can affect healthy cells as well as cancer cells. Report any side effects. Continue your course of treatment even though you feel ill unless your doctor tells you to stop.  Your urine may turn orange-red for a few days after your dose. This is not blood. If your urine is dark or brown, call your doctor.  In some cases, you may be given additional medicines to help with side effects. Follow all directions for their use.  Call your doctor or health care professional for advice if you get a fever (100.5 degrees F or higher), chills or sore throat, or other symptoms of a cold or flu. Do not treat yourself. This drug decreases your body's ability to fight infections. Try to avoid being around people who are sick.  This medicine may increase your risk to bruise or bleed. Call your doctor or   you are receiving this medicine. Avoid taking products that contain aspirin, acetaminophen, ibuprofen, naproxen, or ketoprofen unless instructed by your doctor. These medicines may hide a fever. Men and women of  childbearing age should use effective birth control methods while using taking this medicine. Do not become pregnant while taking this medicine. There is a potential for serious side effects to an unborn child. Talk to your health care professional or pharmacist for more information. Do not breast-feed an infant while taking this medicine. What side effects may I notice from receiving this medicine? Side effects that you should report to your doctor or health care professional as soon as possible: -allergic reactions like skin rash, itching or hives, swelling of the face, lips, or tongue -low blood counts - this medicine may decrease the number of white blood cells, red blood cells and platelets. You may be at increased risk for infections and bleeding. -signs of hand-foot syndrome - tingling or burning, redness, flaking, swelling, small blisters, or small sores on the palms of your hands or the soles of your feet -signs of infection - fever or chills, cough, sore throat, pain or difficulty passing urine -signs of decreased platelets or bleeding - bruising, pinpoint red spots on the skin, black, tarry stools, blood in the urine -signs of decreased red blood cells - unusually weak or tired, fainting spells, lightheadedness -back pain, chills, facial flushing, fever, headache, tightness in the chest or throat during the infusion -breathing problems -chest pain -fast, irregular heartbeat -mouth pain, redness, sores -pain, swelling, redness at site where injected -pain, tingling, numbness in the hands or feet -swelling of ankles, feet, or hands -vomiting Side effects that usually do not require medical attention (report to your doctor or health care professional if they continue or are bothersome): -diarrhea -hair loss -loss of appetite -nail discoloration or damage -nausea -red or watery eyes -red colored urine -stomach upset This list may not describe all possible side effects. Call your  doctor for medical advice about side effects. You may report side effects to FDA at 1-800-FDA-1088. Where should I keep my medicine? This drug is given in a hospital or clinic and will not be stored at home. NOTE: This sheet is a summary. It may not cover all possible information. If you have questions about this medicine, talk to your doctor, pharmacist, or health care provider.  2013, Elsevier/Gold Standard. (08/23/2007 5:08:57 PM)

## 2013-03-21 ENCOUNTER — Other Ambulatory Visit: Payer: Self-pay | Admitting: Internal Medicine

## 2013-03-31 ENCOUNTER — Other Ambulatory Visit: Payer: Self-pay | Admitting: *Deleted

## 2013-03-31 DIAGNOSIS — C469 Kaposi's sarcoma, unspecified: Secondary | ICD-10-CM

## 2013-04-03 ENCOUNTER — Ambulatory Visit (HOSPITAL_BASED_OUTPATIENT_CLINIC_OR_DEPARTMENT_OTHER): Payer: No Typology Code available for payment source | Admitting: Hematology & Oncology

## 2013-04-03 ENCOUNTER — Other Ambulatory Visit (HOSPITAL_BASED_OUTPATIENT_CLINIC_OR_DEPARTMENT_OTHER): Payer: No Typology Code available for payment source | Admitting: Lab

## 2013-04-03 ENCOUNTER — Ambulatory Visit (HOSPITAL_BASED_OUTPATIENT_CLINIC_OR_DEPARTMENT_OTHER): Payer: No Typology Code available for payment source

## 2013-04-03 VITALS — BP 126/80 | HR 96 | Temp 98.2°F | Resp 18 | Ht 67.0 in | Wt 152.0 lb

## 2013-04-03 DIAGNOSIS — B20 Human immunodeficiency virus [HIV] disease: Secondary | ICD-10-CM

## 2013-04-03 DIAGNOSIS — C469 Kaposi's sarcoma, unspecified: Secondary | ICD-10-CM

## 2013-04-03 DIAGNOSIS — Z5111 Encounter for antineoplastic chemotherapy: Secondary | ICD-10-CM

## 2013-04-03 LAB — CBC WITH DIFFERENTIAL (CANCER CENTER ONLY)
BASO#: 0 10*3/uL (ref 0.0–0.2)
BASO%: 0.4 % (ref 0.0–2.0)
Eosinophils Absolute: 0.1 10*3/uL (ref 0.0–0.5)
HCT: 42 % (ref 38.7–49.9)
HGB: 14.4 g/dL (ref 13.0–17.1)
LYMPH#: 1.9 10*3/uL (ref 0.9–3.3)
LYMPH%: 24.2 % (ref 14.0–48.0)
MCH: 33 pg (ref 28.0–33.4)
MONO%: 10.9 % (ref 0.0–13.0)
NEUT#: 4.9 10*3/uL (ref 1.5–6.5)
NEUT%: 63.1 % (ref 40.0–80.0)
RBC: 4.36 10*6/uL (ref 4.20–5.70)
WBC: 7.8 10*3/uL (ref 4.0–10.0)

## 2013-04-03 LAB — COMPREHENSIVE METABOLIC PANEL
ALT: 23 U/L (ref 0–53)
AST: 21 U/L (ref 0–37)
Albumin: 4.2 g/dL (ref 3.5–5.2)
Calcium: 9.6 mg/dL (ref 8.4–10.5)
Chloride: 101 mEq/L (ref 96–112)
Creatinine, Ser: 1.08 mg/dL (ref 0.50–1.35)
Glucose, Bld: 106 mg/dL — ABNORMAL HIGH (ref 70–99)
Potassium: 3.9 mEq/L (ref 3.5–5.3)
Sodium: 137 mEq/L (ref 135–145)

## 2013-04-03 LAB — LACTATE DEHYDROGENASE: LDH: 237 U/L (ref 94–250)

## 2013-04-03 LAB — TECHNOLOGIST REVIEW CHCC SATELLITE

## 2013-04-03 MED ORDER — ONDANSETRON 8 MG/50ML IVPB (CHCC)
8.0000 mg | Freq: Once | INTRAVENOUS | Status: AC
  Administered 2013-04-03: 8 mg via INTRAVENOUS

## 2013-04-03 MED ORDER — SODIUM CHLORIDE 0.9 % IV SOLN: Freq: Once | INTRAVENOUS | Status: DC

## 2013-04-03 MED ORDER — DEXAMETHASONE SODIUM PHOSPHATE 10 MG/ML IJ SOLN
10.0000 mg | Freq: Once | INTRAMUSCULAR | Status: AC
  Administered 2013-04-03: 10 mg via INTRAVENOUS

## 2013-04-03 MED ORDER — DOXORUBICIN HCL LIPOSOMAL CHEMO INJECTION 2 MG/ML
20.0000 mg/m2 | Freq: Once | INTRAVENOUS | Status: AC
  Administered 2013-04-03: 36 mg via INTRAVENOUS
  Filled 2013-04-03: qty 18

## 2013-04-03 MED ORDER — DEXAMETHASONE SODIUM PHOSPHATE 10 MG/ML IJ SOLN
INTRAMUSCULAR | Status: AC
  Filled 2013-04-03: qty 1

## 2013-04-03 NOTE — Progress Notes (Signed)
This office note has been dictated.

## 2013-04-03 NOTE — Patient Instructions (Signed)
[facility] Discharge Instructions for Patients Receiving Chemotherapy  Today you received the following chemotherapy agents Doxil  To help prevent nausea and vomiting after your treatment, we encourage you to take your nausea medication as instructed.   If you develop nausea and vomiting that is not controlled by your nausea medication, call the clinic.   BELOW ARE SYMPTOMS THAT SHOULD BE REPORTED IMMEDIATELY:  *FEVER GREATER THAN 100.5 F  *CHILLS WITH OR WITHOUT FEVER  NAUSEA AND VOMITING THAT IS NOT CONTROLLED WITH YOUR NAUSEA MEDICATION  *UNUSUAL SHORTNESS OF BREATH  *UNUSUAL BRUISING OR BLEEDING  TENDERNESS IN MOUTH AND THROAT WITH OR WITHOUT PRESENCE OF ULCERS  *URINARY PROBLEMS  *BOWEL PROBLEMS  UNUSUAL RASH Items with * indicate a potential emergency and should be followed up as soon as possible.  Feel free to call the clinic you have any questions or concerns. The clinic phone number is [phone].   Doxorubicin injection What is this medicine? DOXORUBICIN (dox oh ROO bi sin) is a chemotherapy drug. It is used to treat many kinds of cancer like Hodgkin's disease, leukemia, non-Hodgkin's lymphoma, neuroblastoma, sarcoma, and Wilms' tumor. It is also used to treat bladder cancer, breast cancer, lung cancer, ovarian cancer, stomach cancer, and thyroid cancer. This medicine may be used for other purposes; ask your health care provider or pharmacist if you have questions. COMMON BRAND NAME(S): Adriamycin PFS, Adriamycin RDF, Adriamycin, Rubex What should I tell my health care provider before I take this medicine? They need to know if you have any of these conditions: -blood disorders -heart disease, recent heart attack -infection (especially a virus infection such as chickenpox, cold sores, or herpes) -irregular heartbeat -liver disease -recent or ongoing radiation therapy -an unusual or allergic reaction to doxorubicin, other chemotherapy agents,  other medicines, foods, dyes, or preservatives -pregnant or trying to get pregnant -breast-feeding How should I use this medicine? This drug is given as an infusion into a vein. It is administered in a hospital or clinic by a specially trained health care professional. If you have pain, swelling, burning or any unusual feeling around the site of your injection, tell your health care professional right away. Talk to your pediatrician regarding the use of this medicine in children. Special care may be needed. Overdosage: If you think you have taken too much of this medicine contact a poison control center or emergency room at once. NOTE: This medicine is only for you. Do not share this medicine with others. What if I miss a dose? It is important not to miss your dose. Call your doctor or health care professional if you are unable to keep an appointment. What may interact with this medicine? Do not take this medicine with any of the following medications: -cisapride -droperidol -halofantrine -pimozide -zidovudine This medicine may also interact with the following medications: -chloroquine -chlorpromazine -clarithromycin -cyclophosphamide -cyclosporine -erythromycin -medicines for depression, anxiety, or psychotic disturbances -medicines for irregular heart beat like amiodarone, bepridil, dofetilide, encainide, flecainide, propafenone, quinidine -medicines for seizures like ethotoin, fosphenytoin, phenytoin -medicines for nausea, vomiting like dolasetron, ondansetron, palonosetron -medicines to increase blood counts like filgrastim, pegfilgrastim, sargramostim -methadone -methotrexate -pentamidine -progesterone -vaccines -verapamil Talk to your doctor or health care professional before taking any of these medicines: -acetaminophen -aspirin -ibuprofen -ketoprofen -naproxen This list may not describe all possible interactions. Give your health care provider a list of all the  medicines, herbs, non-prescription drugs, or dietary supplements you use. Also tell them if you smoke, drink alcohol, or use  illegal drugs. Some items may interact with your medicine. What should I watch for while using this medicine? Your condition will be monitored carefully while you are receiving this medicine. You will need important blood work done while you are taking this medicine. This drug may make you feel generally unwell. This is not uncommon, as chemotherapy can affect healthy cells as well as cancer cells. Report any side effects. Continue your course of treatment even though you feel ill unless your doctor tells you to stop. Your urine may turn red for a few days after your dose. This is not blood. If your urine is dark or brown, call your doctor. In some cases, you may be given additional medicines to help with side effects. Follow all directions for their use. Call your doctor or health care professional for advice if you get a fever, chills or sore throat, or other symptoms of a cold or flu. Do not treat yourself. This drug decreases your body's ability to fight infections. Try to avoid being around people who are sick. This medicine may increase your risk to bruise or bleed. Call your doctor or health care professional if you notice any unusual bleeding. Be careful brushing and flossing your teeth or using a toothpick because you may get an infection or bleed more easily. If you have any dental work done, tell your dentist you are receiving this medicine. Avoid taking products that contain aspirin, acetaminophen, ibuprofen, naproxen, or ketoprofen unless instructed by your doctor. These medicines may hide a fever. Men and women of childbearing age should use effective birth control methods while using taking this medicine. Do not become pregnant while taking this medicine. There is a potential for serious side effects to an unborn child. Talk to your health care professional or  pharmacist for more information. Do not breast-feed an infant while taking this medicine. Do not let others touch your urine or other body fluids for 5 days after each treatment with this medicine. Caregivers should wear latex gloves to avoid touching body fluids during this time. There is a maximum amount of this medicine you should receive throughout your life. The amount depends on the medical condition being treated and your overall health. Your doctor will watch how much of this medicine you receive in your lifetime. Tell your doctor if you have taken this medicine before. What side effects may I notice from receiving this medicine? Side effects that you should report to your doctor or health care professional as soon as possible: -allergic reactions like skin rash, itching or hives, swelling of the face, lips, or tongue -low blood counts - this medicine may decrease the number of white blood cells, red blood cells and platelets. You may be at increased risk for infections and bleeding. -signs of infection - fever or chills, cough, sore throat, pain or difficulty passing urine -signs of decreased platelets or bleeding - bruising, pinpoint red spots on the skin, black, tarry stools, blood in the urine -signs of decreased red blood cells - unusually weak or tired, fainting spells, lightheadedness -breathing problems -chest pain -fast, irregular heartbeat -mouth sores -nausea, vomiting -pain, swelling, redness at site where injected -pain, tingling, numbness in the hands or feet -swelling of ankles, feet, or hands -unusual bleeding or bruising Side effects that usually do not require medical attention (report to your doctor or health care professional if they continue or are bothersome): -diarrhea -facial flushing -hair loss -loss of appetite -missed menstrual periods -nail discoloration or damage -red   or watery eyes -red colored urine -stomach upset This list may not describe all  possible side effects. Call your doctor for medical advice about side effects. You may report side effects to FDA at 1-800-FDA-1088. Where should I keep my medicine? This drug is given in a hospital or clinic and will not be stored at home. NOTE: This sheet is a summary. It may not cover all possible information. If you have questions about this medicine, talk to your doctor, pharmacist, or health care provider.  2014, Elsevier/Gold Standard. (2012-08-30 09:54:34)

## 2013-04-08 NOTE — Progress Notes (Signed)
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  CURRENT THERAPY:  The patient is status post 6 cycles of low-dose Doxil.  INTERIM HISTORY:  Todd Vincent comes in for his followup.  He is doing quite well.  He had a good weekend.  He was up in [place].  I was told about his HIV.  He has been seen by Infectious Disease.  They seem to think that he has been doing fairly well with the HIV.  They did not change his medications at all.  He has had no problem with his bowels or bladder.  There has been some past issue with rectal abscess, but this has not "flared up."  He has not noted any new skin lesions.  There have been no problems with nausea or vomiting.  He has had no fever, sweats, or chills.  He has had no problems with cough.  PHYSICAL EXAMINATION:  General:  This is a well-developed, well- nourished African-American gentleman, in no obvious distress.  Vital signs:  Temperature of 98.2, pulse 96, respiratory rate 18, blood pressure 126/80, weight is 152 pounds.  Head and Neck:  Normocephalic, atraumatic skull.  There are no ocular or oral lesions.  There are no palpable cervical or supraclavicular lymph nodes.  Lungs:  Clear bilaterally.  Cardiac:  Regular rate and rhythm with normal S1 and S2. There are no murmurs, rubs, or bruits.  Abdomen:  Soft.  He has good bowel sounds.  There is no palpable abdominal mass.  There is no palpable hepatosplenomegaly.  Extremities:  No clubbing, cyanosis, or edema.  He has good range of motion of his joints.  He has good muscle strength.  Skin:  Does show Kaposi lesions which are flat.  They are slightly less pigmented.  I do not see any new lesions.  LABORATORY STUDIES:  White cell count is 7.8, hemoglobin 14.4, hematocrit 42, platelet count 206.  IMPRESSION:  Todd Vincent is a very nice 37 year old African-American gentleman.  He is human immunodeficiency virus positive.  The human immunodeficiency virus has been  under fairly good control.  Again, he is being followed by Infectious Disease for this.  I think that with his human immunodeficiency virus control, this help also was Kaposi's control.  We will continue him on the low-dose Doxil.  I do not see any issues from my point of view.  We did go ahead and repeat echocardiogram back in October.  He still had a very good ejection fraction of 45% to 50%.  We will plan to get him back to see [place] in another 6 weeks or so.  He will continue his every 2 week treatment.    ______________________________ Todd Vincent, M.D. PRE/MEDQ  D:  04/03/2013  T:  04/08/2013  Job:  [id number]

## 2013-04-17 ENCOUNTER — Other Ambulatory Visit: Payer: Self-pay | Admitting: Lab

## 2013-04-17 ENCOUNTER — Ambulatory Visit (HOSPITAL_BASED_OUTPATIENT_CLINIC_OR_DEPARTMENT_OTHER): Payer: Self-pay

## 2013-04-17 ENCOUNTER — Other Ambulatory Visit (INDEPENDENT_AMBULATORY_CARE_PROVIDER_SITE_OTHER): Payer: Self-pay

## 2013-04-17 DIAGNOSIS — Z5111 Encounter for antineoplastic chemotherapy: Secondary | ICD-10-CM

## 2013-04-17 DIAGNOSIS — B20 Human immunodeficiency virus [HIV] disease: Secondary | ICD-10-CM

## 2013-04-17 DIAGNOSIS — C469 Kaposi's sarcoma, unspecified: Secondary | ICD-10-CM

## 2013-04-17 MED ORDER — SODIUM CHLORIDE 0.9 % IV SOLN
Freq: Once | INTRAVENOUS | Status: AC
  Administered 2013-04-17: 15:00:00 via INTRAVENOUS

## 2013-04-17 MED ORDER — ONDANSETRON 8 MG/50ML IVPB (CHCC)
8.0000 mg | Freq: Once | INTRAVENOUS | Status: AC
  Administered 2013-04-17: 8 mg via INTRAVENOUS

## 2013-04-17 MED ORDER — DEXAMETHASONE SODIUM PHOSPHATE 10 MG/ML IJ SOLN
INTRAMUSCULAR | Status: AC
  Filled 2013-04-17: qty 1

## 2013-04-17 MED ORDER — DEXTROSE 5 % IV SOLN
20.0000 mg/m2 | Freq: Once | INTRAVENOUS | Status: AC
  Administered 2013-04-17: 36 mg via INTRAVENOUS
  Filled 2013-04-17: qty 18

## 2013-04-17 MED ORDER — DEXAMETHASONE SODIUM PHOSPHATE 10 MG/ML IJ SOLN
10.0000 mg | Freq: Once | INTRAMUSCULAR | Status: AC
  Administered 2013-04-17: 10 mg via INTRAVENOUS

## 2013-04-17 NOTE — Patient Instructions (Signed)
Doxorubicin Liposomal injection  What is this medicine?  LIPOSOMAL DOXORUBICIN (LIP oh som al dox oh ROO bi sin) is a chemotherapy drug. This medicine is used to treat many kinds of cancer like Kaposi's sarcoma, multiple myeloma, and ovarian cancer.  This medicine may be used for other purposes; ask your health care provider or pharmacist if you have questions.  COMMON BRAND NAME(S): Doxil, Lipodox  What should I tell my health care provider before I take this medicine?  They need to know if you have any of these conditions:  -blood disorders  -heart disease  -infection (especially a virus infection such as chickenpox, cold sores, or herpes)  -liver disease  -recent or ongoing radiation therapy  -an unusual or allergic reaction to doxorubicin, other chemotherapy agents, soybeans, other medicines, foods, dyes, or preservatives  -pregnant or trying to get pregnant  -breast-feeding  How should I use this medicine?  This drug is given as an infusion into a vein. It is administered in a hospital or clinic by a specially trained health care professional. If you have pain, swelling, burning or any unusual feeling around the site of your injection, tell your health care professional right away.  Talk to your pediatrician regarding the use of this medicine in children. Special care may be needed.  Overdosage: If you think you have taken too much of this medicine contact a poison control center or emergency room at once.  NOTE: This medicine is only for you. Do not share this medicine with others.  What if I miss a dose?  It is important not to miss your dose. Call your doctor or health care professional if you are unable to keep an appointment.  What may interact with this medicine?  Do not take this medicine with any of the following medications:  -zidovudine  This medicine may also interact with the following medications:  -medicines to increase blood counts like filgrastim, pegfilgrastim, sargramostim  -vaccines  Talk to  your doctor or health care professional before taking any of these medicines:  -acetaminophen  -aspirin  -ibuprofen  -ketoprofen  -naproxen  This list may not describe all possible interactions. Give your health care provider a list of all the medicines, herbs, non-prescription drugs, or dietary supplements you use. Also tell them if you smoke, drink alcohol, or use illegal drugs. Some items may interact with your medicine.  What should I watch for while using this medicine?  Your condition will be monitored carefully while you are receiving this medicine. You will need important blood work done while you are taking this medicine.  This drug may make you feel generally unwell. This is not uncommon, as chemotherapy can affect healthy cells as well as cancer cells. Report any side effects. Continue your course of treatment even though you feel ill unless your doctor tells you to stop.  Your urine may turn orange-red for a few days after your dose. This is not blood. If your urine is dark or brown, call your doctor.  In some cases, you may be given additional medicines to help with side effects. Follow all directions for their use.  Call your doctor or health care professional for advice if you get a fever (100.5 degrees F or higher), chills or sore throat, or other symptoms of a cold or flu. Do not treat yourself. This drug decreases your body's ability to fight infections. Try to avoid being around people who are sick.  This medicine may increase your risk to bruise   or bleed. Call your doctor or health care professional if you notice any unusual bleeding.  Be careful brushing and flossing your teeth or using a toothpick because you may get an infection or bleed more easily. If you have any dental work done, tell your dentist you are receiving this medicine.  Avoid taking products that contain aspirin, acetaminophen, ibuprofen, naproxen, or ketoprofen unless instructed by your doctor. These medicines may hide a  fever.  Men and women of childbearing age should use effective birth control methods while using taking this medicine. Do not become pregnant while taking this medicine. There is a potential for serious side effects to an unborn child. Talk to your health care professional or pharmacist for more information. Do not breast-feed an infant while taking this medicine.  Talk to your doctor about your risk of cancer. You may be more at risk for certain types of cancers if you take this medicine.  What side effects may I notice from receiving this medicine?  Side effects that you should report to your doctor or health care professional as soon as possible:  -allergic reactions like skin rash, itching or hives, swelling of the face, lips, or tongue  -low blood counts - this medicine may decrease the number of white blood cells, red blood cells and platelets. You may be at increased risk for infections and bleeding.  -signs of hand-foot syndrome - tingling or burning, redness, flaking, swelling, small blisters, or small sores on the palms of your hands or the soles of your feet  -signs of infection - fever or chills, cough, sore throat, pain or difficulty passing urine  -signs of decreased platelets or bleeding - bruising, pinpoint red spots on the skin, black, tarry stools, blood in the urine  -signs of decreased red blood cells - unusually weak or tired, fainting spells, lightheadedness  -back pain, chills, facial flushing, fever, headache, tightness in the chest or throat during the infusion  -breathing problems  -chest pain  -fast, irregular heartbeat  -mouth pain, redness, sores  -pain, swelling, redness at site where injected  -pain, tingling, numbness in the hands or feet  -swelling of ankles, feet, or hands  -vomiting  Side effects that usually do not require medical attention (report to your doctor or health care professional if they continue or are bothersome):  -diarrhea  -hair loss  -loss of appetite  -nail  discoloration or damage  -nausea  -red or watery eyes  -red colored urine  -stomach upset  This list may not describe all possible side effects. Call your doctor for medical advice about side effects. You may report side effects to FDA at 1-800-FDA-1088.  Where should I keep my medicine?  This drug is given in a hospital or clinic and will not be stored at home.  NOTE: This sheet is a summary. It may not cover all possible information. If you have questions about this medicine, talk to your doctor, pharmacist, or health care provider.   2014, Elsevier/Gold Standard. (2012-01-22 10:12:56)

## 2013-04-18 ENCOUNTER — Other Ambulatory Visit: Payer: Self-pay | Admitting: Internal Medicine

## 2013-04-18 ENCOUNTER — Other Ambulatory Visit: Payer: Self-pay | Admitting: Hematology & Oncology

## 2013-04-18 LAB — T-HELPER CELL (CD4) - (RCID CLINIC ONLY)
CD4 % Helper T Cell: 14 % — ABNORMAL LOW (ref 33–55)
CD4 T Cell Abs: 200 /uL — ABNORMAL LOW (ref 400–2700)

## 2013-04-21 ENCOUNTER — Ambulatory Visit: Payer: Self-pay | Admitting: Hematology & Oncology

## 2013-04-21 ENCOUNTER — Other Ambulatory Visit: Payer: Self-pay | Admitting: Lab

## 2013-04-21 ENCOUNTER — Ambulatory Visit: Payer: Self-pay

## 2013-04-26 ENCOUNTER — Other Ambulatory Visit: Payer: Self-pay

## 2013-05-01 ENCOUNTER — Ambulatory Visit: Payer: Self-pay

## 2013-05-01 ENCOUNTER — Telehealth: Payer: Self-pay | Admitting: Hematology & Oncology

## 2013-05-01 ENCOUNTER — Other Ambulatory Visit: Payer: Self-pay | Admitting: Lab

## 2013-05-01 NOTE — Telephone Encounter (Signed)
Patient called and cx 05/01/13 apt today and stated he would call back to resch

## 2013-05-01 NOTE — Telephone Encounter (Signed)
Pt cx 12-15 wants to r/s for next week. He has appointment 12-29, Amy aware and will let me know when to schedule

## 2013-05-03 ENCOUNTER — Telehealth: Payer: Self-pay | Admitting: Hematology & Oncology

## 2013-05-03 NOTE — Telephone Encounter (Signed)
Per Amy RN I moved all appointments out a week pt aware of days and times

## 2013-05-08 ENCOUNTER — Other Ambulatory Visit (HOSPITAL_BASED_OUTPATIENT_CLINIC_OR_DEPARTMENT_OTHER): Payer: Self-pay | Admitting: Lab

## 2013-05-08 ENCOUNTER — Ambulatory Visit (HOSPITAL_BASED_OUTPATIENT_CLINIC_OR_DEPARTMENT_OTHER): Payer: Self-pay

## 2013-05-08 VITALS — BP 117/73 | HR 79 | Temp 99.9°F | Resp 18

## 2013-05-08 DIAGNOSIS — C469 Kaposi's sarcoma, unspecified: Secondary | ICD-10-CM

## 2013-05-08 DIAGNOSIS — Z5111 Encounter for antineoplastic chemotherapy: Secondary | ICD-10-CM

## 2013-05-08 LAB — CBC WITH DIFFERENTIAL (CANCER CENTER ONLY)
Eosinophils Absolute: 0.1 10*3/uL (ref 0.0–0.5)
LYMPH#: 1.9 10*3/uL (ref 0.9–3.3)
MONO#: 0.7 10*3/uL (ref 0.1–0.9)
Platelets: 356 10*3/uL (ref 145–400)
RBC: 3.93 10*6/uL — ABNORMAL LOW (ref 4.20–5.70)
RDW: 14.6 % (ref 11.1–15.7)
WBC: 7.6 10*3/uL (ref 4.0–10.0)

## 2013-05-08 LAB — CMP (CANCER CENTER ONLY)
AST: 20 U/L (ref 11–38)
BUN, Bld: 8 mg/dL (ref 7–22)
CO2: 31 mEq/L (ref 18–33)
Calcium: 9.5 mg/dL (ref 8.0–10.3)
Chloride: 100 mEq/L (ref 98–108)
Creat: 1.1 mg/dl (ref 0.6–1.2)
Glucose, Bld: 88 mg/dL (ref 73–118)
Total Bilirubin: 1 mg/dl (ref 0.20–1.60)

## 2013-05-08 LAB — TECHNOLOGIST REVIEW CHCC SATELLITE

## 2013-05-08 MED ORDER — DEXTROSE 5 % IV SOLN
20.0000 mg/m2 | Freq: Once | INTRAVENOUS | Status: AC
  Administered 2013-05-08: 36 mg via INTRAVENOUS
  Filled 2013-05-08: qty 18

## 2013-05-08 MED ORDER — SODIUM CHLORIDE 0.9 % IV SOLN
Freq: Once | INTRAVENOUS | Status: AC
  Administered 2013-05-08: 14:00:00 via INTRAVENOUS

## 2013-05-08 MED ORDER — DEXAMETHASONE SODIUM PHOSPHATE 10 MG/ML IJ SOLN
INTRAMUSCULAR | Status: AC
  Filled 2013-05-08: qty 1

## 2013-05-08 MED ORDER — DEXAMETHASONE SODIUM PHOSPHATE 10 MG/ML IJ SOLN
10.0000 mg | Freq: Once | INTRAMUSCULAR | Status: AC
  Administered 2013-05-08: 10 mg via INTRAVENOUS

## 2013-05-08 MED ORDER — ONDANSETRON 8 MG/50ML IVPB (CHCC)
8.0000 mg | Freq: Once | INTRAVENOUS | Status: AC
  Administered 2013-05-08: 8 mg via INTRAVENOUS

## 2013-05-08 NOTE — Patient Instructions (Signed)
Doxorubicin Liposomal injection  What is this medicine?  LIPOSOMAL DOXORUBICIN (LIP oh som al dox oh ROO bi sin) is a chemotherapy drug. This medicine is used to treat many kinds of cancer like Kaposi's sarcoma, multiple myeloma, and ovarian cancer.  This medicine may be used for other purposes; ask your health care provider or pharmacist if you have questions.  COMMON BRAND NAME(S): Doxil, Lipodox  What should I tell my health care provider before I take this medicine?  They need to know if you have any of these conditions:  -blood disorders  -heart disease  -infection (especially a virus infection such as chickenpox, cold sores, or herpes)  -liver disease  -recent or ongoing radiation therapy  -an unusual or allergic reaction to doxorubicin, other chemotherapy agents, soybeans, other medicines, foods, dyes, or preservatives  -pregnant or trying to get pregnant  -breast-feeding  How should I use this medicine?  This drug is given as an infusion into a vein. It is administered in a hospital or clinic by a specially trained health care professional. If you have pain, swelling, burning or any unusual feeling around the site of your injection, tell your health care professional right away.  Talk to your pediatrician regarding the use of this medicine in children. Special care may be needed.  Overdosage: If you think you have taken too much of this medicine contact a poison control center or emergency room at once.  NOTE: This medicine is only for you. Do not share this medicine with others.  What if I miss a dose?  It is important not to miss your dose. Call your doctor or health care professional if you are unable to keep an appointment.  What may interact with this medicine?  Do not take this medicine with any of the following medications:  -zidovudine  This medicine may also interact with the following medications:  -medicines to increase blood counts like filgrastim, pegfilgrastim, sargramostim  -vaccines  Talk to  your doctor or health care professional before taking any of these medicines:  -acetaminophen  -aspirin  -ibuprofen  -ketoprofen  -naproxen  This list may not describe all possible interactions. Give your health care provider a list of all the medicines, herbs, non-prescription drugs, or dietary supplements you use. Also tell them if you smoke, drink alcohol, or use illegal drugs. Some items may interact with your medicine.  What should I watch for while using this medicine?  Your condition will be monitored carefully while you are receiving this medicine. You will need important blood work done while you are taking this medicine.  This drug may make you feel generally unwell. This is not uncommon, as chemotherapy can affect healthy cells as well as cancer cells. Report any side effects. Continue your course of treatment even though you feel ill unless your doctor tells you to stop.  Your urine may turn orange-red for a few days after your dose. This is not blood. If your urine is dark or brown, call your doctor.  In some cases, you may be given additional medicines to help with side effects. Follow all directions for their use.  Call your doctor or health care professional for advice if you get a fever (100.5 degrees F or higher), chills or sore throat, or other symptoms of a cold or flu. Do not treat yourself. This drug decreases your body's ability to fight infections. Try to avoid being around people who are sick.  This medicine may increase your risk to bruise   or bleed. Call your doctor or health care professional if you notice any unusual bleeding.  Be careful brushing and flossing your teeth or using a toothpick because you may get an infection or bleed more easily. If you have any dental work done, tell your dentist you are receiving this medicine.  Avoid taking products that contain aspirin, acetaminophen, ibuprofen, naproxen, or ketoprofen unless instructed by your doctor. These medicines may hide a  fever.  Men and women of childbearing age should use effective birth control methods while using taking this medicine. Do not become pregnant while taking this medicine. There is a potential for serious side effects to an unborn child. Talk to your health care professional or pharmacist for more information. Do not breast-feed an infant while taking this medicine.  Talk to your doctor about your risk of cancer. You may be more at risk for certain types of cancers if you take this medicine.  What side effects may I notice from receiving this medicine?  Side effects that you should report to your doctor or health care professional as soon as possible:  -allergic reactions like skin rash, itching or hives, swelling of the face, lips, or tongue  -low blood counts - this medicine may decrease the number of white blood cells, red blood cells and platelets. You may be at increased risk for infections and bleeding.  -signs of hand-foot syndrome - tingling or burning, redness, flaking, swelling, small blisters, or small sores on the palms of your hands or the soles of your feet  -signs of infection - fever or chills, cough, sore throat, pain or difficulty passing urine  -signs of decreased platelets or bleeding - bruising, pinpoint red spots on the skin, black, tarry stools, blood in the urine  -signs of decreased red blood cells - unusually weak or tired, fainting spells, lightheadedness  -back pain, chills, facial flushing, fever, headache, tightness in the chest or throat during the infusion  -breathing problems  -chest pain  -fast, irregular heartbeat  -mouth pain, redness, sores  -pain, swelling, redness at site where injected  -pain, tingling, numbness in the hands or feet  -swelling of ankles, feet, or hands  -vomiting  Side effects that usually do not require medical attention (report to your doctor or health care professional if they continue or are bothersome):  -diarrhea  -hair loss  -loss of appetite  -nail  discoloration or damage  -nausea  -red or watery eyes  -red colored urine  -stomach upset  This list may not describe all possible side effects. Call your doctor for medical advice about side effects. You may report side effects to FDA at 1-800-FDA-1088.  Where should I keep my medicine?  This drug is given in a hospital or clinic and will not be stored at home.  NOTE: This sheet is a summary. It may not cover all possible information. If you have questions about this medicine, talk to your doctor, pharmacist, or health care provider.   2014, Elsevier/Gold Standard. (2012-01-22 10:12:56)

## 2013-05-09 ENCOUNTER — Ambulatory Visit (INDEPENDENT_AMBULATORY_CARE_PROVIDER_SITE_OTHER): Payer: Self-pay | Admitting: Internal Medicine

## 2013-05-09 ENCOUNTER — Encounter: Payer: Self-pay | Admitting: Internal Medicine

## 2013-05-09 VITALS — BP 146/76 | HR 81 | Temp 97.5°F | Ht 67.0 in | Wt 146.0 lb

## 2013-05-09 DIAGNOSIS — F32A Depression, unspecified: Secondary | ICD-10-CM | POA: Insufficient documentation

## 2013-05-09 DIAGNOSIS — F17200 Nicotine dependence, unspecified, uncomplicated: Secondary | ICD-10-CM

## 2013-05-09 DIAGNOSIS — Z720 Tobacco use: Secondary | ICD-10-CM

## 2013-05-09 DIAGNOSIS — F329 Major depressive disorder, single episode, unspecified: Secondary | ICD-10-CM | POA: Insufficient documentation

## 2013-05-09 DIAGNOSIS — B20 Human immunodeficiency virus [HIV] disease: Secondary | ICD-10-CM

## 2013-05-09 MED ORDER — CITALOPRAM HYDROBROMIDE 10 MG PO TABS: 10.0000 mg | ORAL_TABLET | Freq: Every day | ORAL | Status: DC

## 2013-05-09 NOTE — Assessment & Plan Note (Signed)
He will get counseling through the cancer center.  I will start Celexa in the meantime which will be covered by his drug assistance program.

## 2013-05-09 NOTE — Progress Notes (Signed)
  Subjective:    Patient ID: Todd Vincent, male    DOB: 11-21-1975, 37 y.o.   MRN: [id number]  HPI  He comes in for routine follow up  He continues on Isentress and truvada, prezista, norvir and denies any missed doses.  He also is on doxil for Kaposi's sarcoma and is tolerating well.  His skin lesions are resolving nicely.  His CD4 is up to 200.  No new issues.  No weight loss, no diarrhea. Some depression, particularly with the season. No SI.  No weight loss.  Appetite ok, but affected by chemo.  To get 6 rounds more.  Remains suppressed with undetectable vl.     Review of Systems  Constitutional: Negative for fever and chills.  HENT: Negative for sore throat and trouble swallowing.   Eyes: Negative for visual disturbance.  Respiratory: Negative for cough and shortness of breath.   Cardiovascular: Negative for chest pain.  Gastrointestinal: Negative for nausea and diarrhea.  Musculoskeletal: Negative for arthralgias and myalgias.  Skin: Positive for rash.       KS  Neurological: Negative for dizziness, light-headedness and headaches.  Hematological: Negative for adenopathy.  Psychiatric/Behavioral: Negative for dysphoric mood.       Objective:   Physical Exam  Constitutional: He appears well-developed and well-nourished. No distress.  HENT:  Mouth/Throat: No oropharyngeal exudate.  Eyes: No scleral icterus.  Cardiovascular: Normal rate, regular rhythm and normal heart sounds.   No murmur heard. Pulmonary/Chest: Effort normal and breath sounds normal. He has no wheezes.  Lymphadenopathy:    He has no cervical adenopathy.  Neurological: He is alert.  Skin: Rash noted.  KS  Psychiatric: He has a normal mood and affect. His behavior is normal.          Assessment & Plan:

## 2013-05-09 NOTE — Assessment & Plan Note (Signed)
Back on cigerettes, counseled on cessation

## 2013-05-09 NOTE — Assessment & Plan Note (Signed)
He is doing well with his current meds.  I will consider changing Isentress to daily Tivicay, but for now will not make any big changes.

## 2013-05-10 ENCOUNTER — Other Ambulatory Visit: Payer: Self-pay | Admitting: Internal Medicine

## 2013-05-11 DIAGNOSIS — B20 Human immunodeficiency virus [HIV] disease: Secondary | ICD-10-CM | POA: Insufficient documentation

## 2013-05-15 ENCOUNTER — Other Ambulatory Visit: Payer: Self-pay | Admitting: Licensed Clinical Social Worker

## 2013-05-15 ENCOUNTER — Ambulatory Visit: Payer: Self-pay

## 2013-05-15 ENCOUNTER — Other Ambulatory Visit: Payer: Self-pay | Admitting: Lab

## 2013-05-15 ENCOUNTER — Ambulatory Visit: Payer: Self-pay | Admitting: Hematology & Oncology

## 2013-05-15 DIAGNOSIS — B20 Human immunodeficiency virus [HIV] disease: Secondary | ICD-10-CM

## 2013-05-15 MED ORDER — DAPSONE 100 MG PO TABS: ORAL_TABLET | ORAL | Status: DC

## 2013-05-22 ENCOUNTER — Other Ambulatory Visit: Payer: Self-pay | Admitting: *Deleted

## 2013-05-22 ENCOUNTER — Other Ambulatory Visit (HOSPITAL_BASED_OUTPATIENT_CLINIC_OR_DEPARTMENT_OTHER): Payer: Self-pay | Admitting: Lab

## 2013-05-22 ENCOUNTER — Ambulatory Visit (HOSPITAL_BASED_OUTPATIENT_CLINIC_OR_DEPARTMENT_OTHER): Payer: Self-pay

## 2013-05-22 ENCOUNTER — Ambulatory Visit (HOSPITAL_BASED_OUTPATIENT_CLINIC_OR_DEPARTMENT_OTHER): Payer: Self-pay | Admitting: Hematology & Oncology

## 2013-05-22 VITALS — BP 119/71 | HR 96 | Temp 99.2°F | Resp 20 | Ht 67.0 in | Wt 147.0 lb

## 2013-05-22 DIAGNOSIS — C469 Kaposi's sarcoma, unspecified: Secondary | ICD-10-CM

## 2013-05-22 DIAGNOSIS — B20 Human immunodeficiency virus [HIV] disease: Secondary | ICD-10-CM

## 2013-05-22 DIAGNOSIS — Z5111 Encounter for antineoplastic chemotherapy: Secondary | ICD-10-CM

## 2013-05-22 LAB — COMPREHENSIVE METABOLIC PANEL
ALBUMIN: 3.4 g/dL — AB (ref 3.5–5.2)
ALK PHOS: 77 U/L (ref 39–117)
ALT: 8 U/L (ref 0–53)
AST: 9 U/L (ref 0–37)
BUN: 6 mg/dL (ref 6–23)
CHLORIDE: 101 meq/L (ref 96–112)
CO2: 32 mEq/L (ref 19–32)
Calcium: 8.9 mg/dL (ref 8.4–10.5)
Creatinine, Ser: 0.79 mg/dL (ref 0.50–1.35)
Glucose, Bld: 87 mg/dL (ref 70–99)
POTASSIUM: 4 meq/L (ref 3.5–5.3)
SODIUM: 141 meq/L (ref 135–145)
TOTAL PROTEIN: 5.7 g/dL — AB (ref 6.0–8.3)
Total Bilirubin: 0.4 mg/dL (ref 0.3–1.2)

## 2013-05-22 LAB — CBC WITH DIFFERENTIAL (CANCER CENTER ONLY)
BASO#: 0 10*3/uL (ref 0.0–0.2)
BASO%: 0.3 % (ref 0.0–2.0)
EOS%: 0.4 % (ref 0.0–7.0)
Eosinophils Absolute: 0 10*3/uL (ref 0.0–0.5)
HCT: 33.3 % — ABNORMAL LOW (ref 38.7–49.9)
HEMOGLOBIN: 11.1 g/dL — AB (ref 13.0–17.1)
LYMPH#: 1.3 10*3/uL (ref 0.9–3.3)
LYMPH%: 11.7 % — ABNORMAL LOW (ref 14.0–48.0)
MCH: 32.7 pg (ref 28.0–33.4)
MCHC: 33.3 g/dL (ref 32.0–35.9)
MCV: 98 fL (ref 82–98)
MONO#: 0.6 10*3/uL (ref 0.1–0.9)
MONO%: 5.5 % (ref 0.0–13.0)
NEUT%: 82.1 % — ABNORMAL HIGH (ref 40.0–80.0)
NEUTROS ABS: 9.2 10*3/uL — AB (ref 1.5–6.5)
PLATELETS: 260 10*3/uL (ref 145–400)
RBC: 3.39 10*6/uL — AB (ref 4.20–5.70)
RDW: 14.4 % (ref 11.1–15.7)
WBC: 11.2 10*3/uL — ABNORMAL HIGH (ref 4.0–10.0)

## 2013-05-22 MED ORDER — SODIUM CHLORIDE 0.9 % IV SOLN
Freq: Once | INTRAVENOUS | Status: AC
  Administered 2013-05-22: 13:00:00 via INTRAVENOUS

## 2013-05-22 MED ORDER — DEXAMETHASONE SODIUM PHOSPHATE 10 MG/ML IJ SOLN
INTRAMUSCULAR | Status: AC
  Filled 2013-05-22: qty 1

## 2013-05-22 MED ORDER — DEXAMETHASONE SODIUM PHOSPHATE 10 MG/ML IJ SOLN
10.0000 mg | Freq: Once | INTRAMUSCULAR | Status: AC
  Administered 2013-05-22: 10 mg via INTRAVENOUS

## 2013-05-22 MED ORDER — DOXORUBICIN HCL LIPOSOMAL CHEMO INJECTION 2 MG/ML
20.0000 mg/m2 | Freq: Once | INTRAVENOUS | Status: AC
  Administered 2013-05-22: 36 mg via INTRAVENOUS
  Filled 2013-05-22: qty 18

## 2013-05-22 MED ORDER — ONDANSETRON 8 MG/50ML IVPB (CHCC)
8.0000 mg | Freq: Once | INTRAVENOUS | Status: AC
  Administered 2013-05-22: 8 mg via INTRAVENOUS

## 2013-05-22 NOTE — Patient Instructions (Signed)
Doxorubicin Liposomal injection What is this medicine? LIPOSOMAL DOXORUBICIN (LIP oh som al dox oh ROO bi sin) is a chemotherapy drug. This medicine is used to treat many kinds of cancer like Kaposi's sarcoma, multiple myeloma, and ovarian cancer. This medicine may be used for other purposes; ask your health care provider or pharmacist if you have questions. COMMON BRAND NAME(S): Doxil, Lipodox What should I tell my health care provider before I take this medicine? They need to know if you have any of these conditions: -blood disorders -heart disease -infection (especially a virus infection such as chickenpox, cold sores, or herpes) -liver disease -recent or ongoing radiation therapy -an unusual or allergic reaction to doxorubicin, other chemotherapy agents, soybeans, other medicines, foods, dyes, or preservatives -pregnant or trying to get pregnant -breast-feeding How should I use this medicine? This drug is given as an infusion into a vein. It is administered in a hospital or clinic by a specially trained health care professional. If you have pain, swelling, burning or any unusual feeling around the site of your injection, tell your health care professional right away. Talk to your pediatrician regarding the use of this medicine in children. Special care may be needed. Overdosage: If you think you have taken too much of this medicine contact a poison control center or emergency room at once. NOTE: This medicine is only for you. Do not share this medicine with others. What if I miss a dose? It is important not to miss your dose. Call your doctor or health care professional if you are unable to keep an appointment. What may interact with this medicine? Do not take this medicine with any of the following medications: -zidovudine This medicine may also interact with the following medications: -medicines to increase blood counts like filgrastim, pegfilgrastim, sargramostim -vaccines Talk to  your doctor or health care professional before taking any of these medicines: -acetaminophen -aspirin -ibuprofen -ketoprofen -naproxen This list may not describe all possible interactions. Give your health care provider a list of all the medicines, herbs, non-prescription drugs, or dietary supplements you use. Also tell them if you smoke, drink alcohol, or use illegal drugs. Some items may interact with your medicine. What should I watch for while using this medicine? Your condition will be monitored carefully while you are receiving this medicine. You will need important blood work done while you are taking this medicine. This drug may make you feel generally unwell. This is not uncommon, as chemotherapy can affect healthy cells as well as cancer cells. Report any side effects. Continue your course of treatment even though you feel ill unless your doctor tells you to stop. Your urine may turn orange-red for a few days after your dose. This is not blood. If your urine is dark or brown, call your doctor. In some cases, you may be given additional medicines to help with side effects. Follow all directions for their use. Call your doctor or health care professional for advice if you get a fever (100.5 degrees F or higher), chills or sore throat, or other symptoms of a cold or flu. Do not treat yourself. This drug decreases your body's ability to fight infections. Try to avoid being around people who are sick. This medicine may increase your risk to bruise or bleed. Call your doctor or health care professional if you notice any unusual bleeding. Be careful brushing and flossing your teeth or using a toothpick because you may get an infection or bleed more easily. If you have any dental  or bleed. Call your doctor or health care professional if you notice any unusual bleeding.  Be careful brushing and flossing your teeth or using a toothpick because you may get an infection or bleed more easily. If you have any dental work done, tell your dentist you are receiving this medicine.  Avoid taking products that contain aspirin, acetaminophen, ibuprofen, naproxen, or ketoprofen unless instructed by your doctor. These medicines may hide a  fever.  Men and women of childbearing age should use effective birth control methods while using taking this medicine. Do not become pregnant while taking this medicine. There is a potential for serious side effects to an unborn child. Talk to your health care professional or pharmacist for more information. Do not breast-feed an infant while taking this medicine.  Talk to your doctor about your risk of cancer. You may be more at risk for certain types of cancers if you take this medicine.  What side effects may I notice from receiving this medicine?  Side effects that you should report to your doctor or health care professional as soon as possible:  -allergic reactions like skin rash, itching or hives, swelling of the face, lips, or tongue  -low blood counts - this medicine may decrease the number of white blood cells, red blood cells and platelets. You may be at increased risk for infections and bleeding.  -signs of hand-foot syndrome - tingling or burning, redness, flaking, swelling, small blisters, or small sores on the palms of your hands or the soles of your feet  -signs of infection - fever or chills, cough, sore throat, pain or difficulty passing urine  -signs of decreased platelets or bleeding - bruising, pinpoint red spots on the skin, black, tarry stools, blood in the urine  -signs of decreased red blood cells - unusually weak or tired, fainting spells, lightheadedness  -back pain, chills, facial flushing, fever, headache, tightness in the chest or throat during the infusion  -breathing problems  -chest pain  -fast, irregular heartbeat  -mouth pain, redness, sores  -pain, swelling, redness at site where injected  -pain, tingling, numbness in the hands or feet  -swelling of ankles, feet, or hands  -vomiting  Side effects that usually do not require medical attention (report to your doctor or health care professional if they continue or are bothersome):  -diarrhea  -hair loss  -loss of appetite  -nail  discoloration or damage  -nausea  -red or watery eyes  -red colored urine  -stomach upset  This list may not describe all possible side effects. Call your doctor for medical advice about side effects. You may report side effects to FDA at 1-800-FDA-1088.  Where should I keep my medicine?  This drug is given in a hospital or clinic and will not be stored at home.  NOTE: This sheet is a summary. It may not cover all possible information. If you have questions about this medicine, talk to your doctor, pharmacist, or health care provider.   2014, Elsevier/Gold Standard. (2012-01-22 10:12:56)

## 2013-05-22 NOTE — Progress Notes (Signed)
This office note has been dictated.

## 2013-05-23 ENCOUNTER — Encounter: Payer: Self-pay | Admitting: Internal Medicine

## 2013-05-23 ENCOUNTER — Ambulatory Visit (INDEPENDENT_AMBULATORY_CARE_PROVIDER_SITE_OTHER): Payer: Self-pay | Admitting: Internal Medicine

## 2013-05-23 ENCOUNTER — Other Ambulatory Visit: Payer: Self-pay | Admitting: *Deleted

## 2013-05-23 ENCOUNTER — Ambulatory Visit: Payer: Self-pay

## 2013-05-23 VITALS — BP 138/77 | HR 101 | Temp 98.7°F | Ht 67.0 in | Wt 149.0 lb

## 2013-05-23 DIAGNOSIS — F32A Depression, unspecified: Secondary | ICD-10-CM

## 2013-05-23 DIAGNOSIS — K612 Anorectal abscess: Secondary | ICD-10-CM

## 2013-05-23 DIAGNOSIS — B20 Human immunodeficiency virus [HIV] disease: Secondary | ICD-10-CM

## 2013-05-23 DIAGNOSIS — F3289 Other specified depressive episodes: Secondary | ICD-10-CM

## 2013-05-23 DIAGNOSIS — F329 Major depressive disorder, single episode, unspecified: Secondary | ICD-10-CM

## 2013-05-23 DIAGNOSIS — K611 Rectal abscess: Secondary | ICD-10-CM

## 2013-05-23 MED ORDER — RITONAVIR 100 MG PO TABS
ORAL_TABLET | ORAL | Status: DC
Start: 2013-05-23 — End: 2013-09-19

## 2013-05-23 MED ORDER — EMTRICITABINE-TENOFOVIR DF 200-300 MG PO TABS: 1.0000 | ORAL_TABLET | Freq: Every day | ORAL | Status: DC

## 2013-05-23 MED ORDER — RALTEGRAVIR POTASSIUM 400 MG PO TABS: ORAL_TABLET | ORAL | Status: DC

## 2013-05-23 MED ORDER — DARUNAVIR ETHANOLATE 800 MG PO TABS: 800.0000 mg | ORAL_TABLET | Freq: Every day | ORAL | Status: DC

## 2013-05-23 NOTE — Progress Notes (Signed)
DIAGNOSES: 1. Kaposi sarcoma. 2. Human immunodeficiency virus -- well controlled.  CURRENT THERAPY:  The patient's status post 9 cycles of Doxil -- low dose 2 week.  INTERIM HISTORY:  Todd Vincent comes in for a  followup.  He is really doing quite well.  Appears tolerated Doxil nicely.  The Kaposi lesions have "flattened out."  They have lightened up.  He has not noted any additional lesions.  Last echocardiogram was done back in October.  His ejection fraction at that point time was 45-50%.  He has had no cough or shortness breath.  He has had no nausea or vomiting.  He has had no change in bowel or bladder habits. He has had no further issues with rectal abscess.  PHYSICAL EXAMINATION:  General:  This is a well-developed, well- nourished African American gentleman, in no obvious distress.  Vital Signs:  Temperature of 99.3, pulse 96, respiratory 20, blood pressure 119/71, weight is 247 pounds.  Head and Neck:  Normocephalic, atraumatic skull.  There are no ocular or oral lesions.  There are no palpable cervical or supraclavicular lymph nodes.  Lungs:  Clear bilaterally. Cardiac:  Regular rate and rhythm with normal S1, S2.  There are no murmurs, rubs, or bruits.  Abdomen:  Soft.  He has good bowel sounds. There is no fluid wave.  There is no palpable hepatosplenomegaly. Extremities:  No clubbing, cyanosis, or edema.  He has good strength in his arms and legs.  Skin exam does show the Kaposi lesions mostly on his arms.  They appear to be flat, slightly violaceous.  I do not notice any additional lesions.  Neurological:  No focal neurological deficits.  LABORATORY STUDIES:  White cell count is 11.2, hemoglobin 11.1, hematocrit 33.3, platelet count 260.  IMPRESSION:  Todd Vincent is a nice 38 year old gentleman with HIV.  This has been under very good control.  [facility]  His last viral titers that showed a quantitative level of 34 for  the HIV.  His quantitative RNA log was 1.53.  In my point of view,  since Kaposi lesions have been doing well, we can probably think about giving him a break from therapy.  I want try to get him through February.  This would give him 6 months of therapy.  After that, we can then follow him along and reassess with our scans and echocardiogram.  I will plan to see Todd Vincent probably about 6 weeks.  I reviewed all the labs with him.  I went over my recommendations with him.  I spent a good 25-30 minutes with him.    ______________________________ [name], M.D. PRE/MEDQ  D:  05/22/2013  T:  05/23/2013  Job:  [id number]

## 2013-05-23 NOTE — Assessment & Plan Note (Signed)
Now s/p self I and D and doing ok.  Is to follow up with his surgeon in [place]

## 2013-05-23 NOTE — Assessment & Plan Note (Signed)
Improved.  With mild sleep disturbance I will not try to titrate up at this time.

## 2013-05-23 NOTE — Progress Notes (Signed)
   Subjective:    Patient ID: Todd Vincent, male    DOB: [birth year], 38 y.o.   MRN: [id number]  HPI He is here4 followup of depression. I saw him for his routine followup in December and noted worsening depression.  I started him on Celexa and he was going to start counseling through the cancer center where he is actively receiving treatment for Kaposi's sarcoma.  He was unable to get into any counseling there but is going to his sister's college for counseling where it will be provided free of charge.  He noted no increase in depression and some minimal sleep disturbance but feels much better with the Celexa.  No suicidal ideation.  He also notes recent issues with perirectal abscess that is managed by a surgeon in [place] where is parents live.  He was recently evaluated for a fistula but no problems so far.  He did develop another lesion and self I and D'd it with good relief.  No fever, no chills.    Review of Systems  Constitutional: Negative for fever.  Gastrointestinal: Negative for nausea and diarrhea.  Psychiatric/Behavioral: Positive for sleep disturbance. Negative for suicidal ideas.       Improved depression       Objective:   Physical Exam  Constitutional: He is oriented to person, place, and time. He appears well-developed and well-nourished. No distress.  Eyes: No scleral icterus.  Neurological: He is alert and oriented to person, place, and time.  Skin: No rash noted.  Psychiatric: He has a normal mood and affect. His behavior is normal.          Assessment & Plan:

## 2013-05-29 ENCOUNTER — Other Ambulatory Visit: Payer: Self-pay | Admitting: Lab

## 2013-05-29 ENCOUNTER — Ambulatory Visit: Payer: Self-pay | Admitting: Hematology & Oncology

## 2013-05-29 ENCOUNTER — Ambulatory Visit: Payer: Self-pay

## 2013-06-05 ENCOUNTER — Ambulatory Visit: Payer: Self-pay

## 2013-06-05 ENCOUNTER — Ambulatory Visit: Payer: Self-pay | Admitting: Hematology & Oncology

## 2013-06-05 ENCOUNTER — Other Ambulatory Visit (HOSPITAL_BASED_OUTPATIENT_CLINIC_OR_DEPARTMENT_OTHER): Payer: Self-pay | Admitting: Lab

## 2013-06-05 ENCOUNTER — Ambulatory Visit (HOSPITAL_BASED_OUTPATIENT_CLINIC_OR_DEPARTMENT_OTHER): Payer: Self-pay

## 2013-06-05 ENCOUNTER — Other Ambulatory Visit: Payer: Self-pay | Admitting: Lab

## 2013-06-05 VITALS — BP 130/80 | HR 101 | Temp 99.1°F | Resp 16

## 2013-06-05 DIAGNOSIS — Z5111 Encounter for antineoplastic chemotherapy: Secondary | ICD-10-CM

## 2013-06-05 DIAGNOSIS — C469 Kaposi's sarcoma, unspecified: Secondary | ICD-10-CM

## 2013-06-05 DIAGNOSIS — B20 Human immunodeficiency virus [HIV] disease: Secondary | ICD-10-CM

## 2013-06-05 LAB — CBC WITH DIFFERENTIAL (CANCER CENTER ONLY)
BASO#: 0 10*3/uL (ref 0.0–0.2)
BASO%: 0.4 % (ref 0.0–2.0)
EOS%: 1.3 % (ref 0.0–7.0)
Eosinophils Absolute: 0.1 10*3/uL (ref 0.0–0.5)
HCT: 39.2 % (ref 38.7–49.9)
HEMOGLOBIN: 13.1 g/dL (ref 13.0–17.1)
LYMPH#: 1.5 10*3/uL (ref 0.9–3.3)
LYMPH%: 16.9 % (ref 14.0–48.0)
MCH: 32.5 pg (ref 28.0–33.4)
MCHC: 33.4 g/dL (ref 32.0–35.9)
MCV: 97 fL (ref 82–98)
MONO#: 0.8 10*3/uL (ref 0.1–0.9)
MONO%: 8.6 % (ref 0.0–13.0)
NEUT%: 72.8 % (ref 40.0–80.0)
NEUTROS ABS: 6.5 10*3/uL (ref 1.5–6.5)
Platelets: 370 10*3/uL (ref 145–400)
RBC: 4.03 10*6/uL — ABNORMAL LOW (ref 4.20–5.70)
RDW: 15.4 % (ref 11.1–15.7)
WBC: 8.9 10*3/uL (ref 4.0–10.0)

## 2013-06-05 LAB — COMPREHENSIVE METABOLIC PANEL
ALBUMIN: 4 g/dL (ref 3.5–5.2)
ALT: 11 U/L (ref 0–53)
AST: 19 U/L (ref 0–37)
Alkaline Phosphatase: 81 U/L (ref 39–117)
BUN: 9 mg/dL (ref 6–23)
CO2: 24 mEq/L (ref 19–32)
Calcium: 9.3 mg/dL (ref 8.4–10.5)
Chloride: 107 mEq/L (ref 96–112)
Creatinine, Ser: 0.83 mg/dL (ref 0.50–1.35)
Glucose, Bld: 87 mg/dL (ref 70–99)
POTASSIUM: 4.1 meq/L (ref 3.5–5.3)
SODIUM: 139 meq/L (ref 135–145)
TOTAL PROTEIN: 6.2 g/dL (ref 6.0–8.3)
Total Bilirubin: 0.5 mg/dL (ref 0.3–1.2)

## 2013-06-05 MED ORDER — DOXORUBICIN HCL LIPOSOMAL CHEMO INJECTION 2 MG/ML
20.0000 mg/m2 | Freq: Once | INTRAVENOUS | Status: AC
  Administered 2013-06-05: 36 mg via INTRAVENOUS
  Filled 2013-06-05: qty 18

## 2013-06-05 MED ORDER — ONDANSETRON 8 MG/50ML IVPB (CHCC)
8.0000 mg | Freq: Once | INTRAVENOUS | Status: AC
Start: 2013-06-05 — End: 2013-06-05
  Administered 2013-06-05: 8 mg via INTRAVENOUS

## 2013-06-05 MED ORDER — DEXAMETHASONE SODIUM PHOSPHATE 10 MG/ML IJ SOLN
10.0000 mg | Freq: Once | INTRAMUSCULAR | Status: AC
  Administered 2013-06-05: 10 mg via INTRAVENOUS

## 2013-06-05 MED ORDER — DEXAMETHASONE SODIUM PHOSPHATE 10 MG/ML IJ SOLN
INTRAMUSCULAR | Status: AC
  Filled 2013-06-05: qty 1

## 2013-06-05 MED ORDER — SODIUM CHLORIDE 0.9 % IV SOLN
Freq: Once | INTRAVENOUS | Status: AC
  Administered 2013-06-05: 12:00:00 via INTRAVENOUS

## 2013-06-05 NOTE — Patient Instructions (Signed)
Doxorubicin injection What is this medicine? DOXORUBICIN (dox oh ROO bi sin) is a chemotherapy drug. It is used to treat many kinds of cancer like Hodgkin's disease, leukemia, non-Hodgkin's lymphoma, neuroblastoma, sarcoma, and Wilms' tumor. It is also used to treat bladder cancer, breast cancer, lung cancer, ovarian cancer, stomach cancer, and thyroid cancer. This medicine may be used for other purposes; ask your health care provider or pharmacist if you have questions. COMMON BRAND NAME(S): Adriamycin PFS, Adriamycin RDF, Adriamycin, Rubex What should I tell my health care provider before I take this medicine? They need to know if you have any of these conditions: -blood disorders -heart disease, recent heart attack -infection (especially a virus infection such as chickenpox, cold sores, or herpes) -irregular heartbeat -liver disease -recent or ongoing radiation therapy -an unusual or allergic reaction to doxorubicin, other chemotherapy agents, other medicines, foods, dyes, or preservatives -pregnant or trying to get pregnant -breast-feeding How should I use this medicine? This drug is given as an infusion into a vein. It is administered in a hospital or clinic by a specially trained health care professional. If you have pain, swelling, burning or any unusual feeling around the site of your injection, tell your health care professional right away. Talk to your pediatrician regarding the use of this medicine in children. Special care may be needed. Overdosage: If you think you have taken too much of this medicine contact a poison control center or emergency room at once. NOTE: This medicine is only for you. Do not share this medicine with others. What if I miss a dose? It is important not to miss your dose. Call your doctor or health care professional if you are unable to keep an appointment. What may interact with this medicine? Do not take this medicine with any of the following  medications: -cisapride -droperidol -halofantrine -pimozide -zidovudine This medicine may also interact with the following medications: -chloroquine -chlorpromazine -clarithromycin -cyclophosphamide -cyclosporine -erythromycin -medicines for depression, anxiety, or psychotic disturbances -medicines for irregular heart beat like amiodarone, bepridil, dofetilide, encainide, flecainide, propafenone, quinidine -medicines for seizures like ethotoin, fosphenytoin, phenytoin -medicines for nausea, vomiting like dolasetron, ondansetron, palonosetron -medicines to increase blood counts like filgrastim, pegfilgrastim, sargramostim -methadone -methotrexate -pentamidine -progesterone -vaccines -verapamil Talk to your doctor or health care professional before taking any of these medicines: -acetaminophen -aspirin -ibuprofen -ketoprofen -naproxen This list may not describe all possible interactions. Give your health care provider a list of all the medicines, herbs, non-prescription drugs, or dietary supplements you use. Also tell them if you smoke, drink alcohol, or use illegal drugs. Some items may interact with your medicine. What should I watch for while using this medicine? Your condition will be monitored carefully while you are receiving this medicine. You will need important blood work done while you are taking this medicine. This drug may make you feel generally unwell. This is not uncommon, as chemotherapy can affect healthy cells as well as cancer cells. Report any side effects. Continue your course of treatment even though you feel ill unless your doctor tells you to stop. Your urine may turn red for a few days after your dose. This is not blood. If your urine is dark or brown, call your doctor. In some cases, you may be given additional medicines to help with side effects. Follow all directions for their use. Call your doctor or health care professional for advice if you get a  fever, chills or sore throat, or other symptoms of a cold or flu. Do not  treat yourself. This drug decreases your body's ability to fight infections. Try to avoid being around people who are sick. This medicine may increase your risk to bruise or bleed. Call your doctor or health care professional if you notice any unusual bleeding. Be careful brushing and flossing your teeth or using a toothpick because you may get an infection or bleed more easily. If you have any dental work done, tell your dentist you are receiving this medicine. Avoid taking products that contain aspirin, acetaminophen, ibuprofen, naproxen, or ketoprofen unless instructed by your doctor. These medicines may hide a fever. Men and women of childbearing age should use effective birth control methods while using taking this medicine. Do not become pregnant while taking this medicine. There is a potential for serious side effects to an unborn child. Talk to your health care professional or pharmacist for more information. Do not breast-feed an infant while taking this medicine. Do not let others touch your urine or other body fluids for 5 days after each treatment with this medicine. Caregivers should wear latex gloves to avoid touching body fluids during this time. There is a maximum amount of this medicine you should receive throughout your life. The amount depends on the medical condition being treated and your overall health. Your doctor will watch how much of this medicine you receive in your lifetime. Tell your doctor if you have taken this medicine before. What side effects may I notice from receiving this medicine? Side effects that you should report to your doctor or health care professional as soon as possible: -allergic reactions like skin rash, itching or hives, swelling of the face, lips, or tongue -low blood counts - this medicine may decrease the number of white blood cells, red blood cells and platelets. You may be at  increased risk for infections and bleeding. -signs of infection - fever or chills, cough, sore throat, pain or difficulty passing urine -signs of decreased platelets or bleeding - bruising, pinpoint red spots on the skin, black, tarry stools, blood in the urine -signs of decreased red blood cells - unusually weak or tired, fainting spells, lightheadedness -breathing problems -chest pain -fast, irregular heartbeat -mouth sores -nausea, vomiting -pain, swelling, redness at site where injected -pain, tingling, numbness in the hands or feet -swelling of ankles, feet, or hands -unusual bleeding or bruising Side effects that usually do not require medical attention (report to your doctor or health care professional if they continue or are bothersome): -diarrhea -facial flushing -hair loss -loss of appetite -missed menstrual periods -nail discoloration or damage -red or watery eyes -red colored urine -stomach upset This list may not describe all possible side effects. Call your doctor for medical advice about side effects. You may report side effects to FDA at 1-800-FDA-1088. Where should I keep my medicine? This drug is given in a hospital or clinic and will not be stored at home. NOTE: This sheet is a summary. It may not cover all possible information. If you have questions about this medicine, talk to your doctor, pharmacist, or health care provider.  2014, Elsevier/Gold Standard. (2012-08-30 09:54:34)

## 2013-06-19 ENCOUNTER — Other Ambulatory Visit (HOSPITAL_BASED_OUTPATIENT_CLINIC_OR_DEPARTMENT_OTHER): Payer: No Typology Code available for payment source | Admitting: Lab

## 2013-06-19 ENCOUNTER — Ambulatory Visit (HOSPITAL_BASED_OUTPATIENT_CLINIC_OR_DEPARTMENT_OTHER): Payer: No Typology Code available for payment source

## 2013-06-19 VITALS — BP 109/70 | HR 101 | Temp 98.2°F | Resp 20 | Wt 152.0 lb

## 2013-06-19 DIAGNOSIS — C469 Kaposi's sarcoma, unspecified: Secondary | ICD-10-CM

## 2013-06-19 DIAGNOSIS — B20 Human immunodeficiency virus [HIV] disease: Secondary | ICD-10-CM

## 2013-06-19 DIAGNOSIS — Z5111 Encounter for antineoplastic chemotherapy: Secondary | ICD-10-CM

## 2013-06-19 LAB — COMPREHENSIVE METABOLIC PANEL
ALK PHOS: 69 U/L (ref 39–117)
ALT: 13 U/L (ref 0–53)
AST: 20 U/L (ref 0–37)
Albumin: 3.8 g/dL (ref 3.5–5.2)
BILIRUBIN TOTAL: 0.4 mg/dL (ref 0.2–1.2)
BUN: 10 mg/dL (ref 6–23)
CO2: 26 mEq/L (ref 19–32)
CREATININE: 0.96 mg/dL (ref 0.50–1.35)
Calcium: 8.9 mg/dL (ref 8.4–10.5)
Chloride: 108 mEq/L (ref 96–112)
Glucose, Bld: 100 mg/dL — ABNORMAL HIGH (ref 70–99)
Potassium: 4.4 mEq/L (ref 3.5–5.3)
Sodium: 142 mEq/L (ref 135–145)
Total Protein: 5.8 g/dL — ABNORMAL LOW (ref 6.0–8.3)

## 2013-06-19 LAB — CBC WITH DIFFERENTIAL (CANCER CENTER ONLY)
BASO#: 0 10*3/uL (ref 0.0–0.2)
BASO%: 0.6 % (ref 0.0–2.0)
EOS%: 3.7 % (ref 0.0–7.0)
Eosinophils Absolute: 0.3 10*3/uL (ref 0.0–0.5)
HEMATOCRIT: 40.7 % (ref 38.7–49.9)
HGB: 13.4 g/dL (ref 13.0–17.1)
LYMPH#: 1.8 10*3/uL (ref 0.9–3.3)
LYMPH%: 25.7 % (ref 14.0–48.0)
MCH: 31.8 pg (ref 28.0–33.4)
MCHC: 32.9 g/dL (ref 32.0–35.9)
MCV: 96 fL (ref 82–98)
MONO#: 0.5 10*3/uL (ref 0.1–0.9)
MONO%: 7.7 % (ref 0.0–13.0)
NEUT#: 4.3 10*3/uL (ref 1.5–6.5)
NEUT%: 62.3 % (ref 40.0–80.0)
PLATELETS: 260 10*3/uL (ref 145–400)
RBC: 4.22 10*6/uL (ref 4.20–5.70)
RDW: 15.8 % — AB (ref 11.1–15.7)
WBC: 6.8 10*3/uL (ref 4.0–10.0)

## 2013-06-19 MED ORDER — SODIUM CHLORIDE 0.9 % IV SOLN
Freq: Once | INTRAVENOUS | Status: AC
  Administered 2013-06-19: 12:00:00 via INTRAVENOUS

## 2013-06-19 MED ORDER — DOXORUBICIN HCL LIPOSOMAL CHEMO INJECTION 2 MG/ML
20.0000 mg/m2 | Freq: Once | INTRAVENOUS | Status: AC
  Administered 2013-06-19: 36 mg via INTRAVENOUS
  Filled 2013-06-19: qty 18

## 2013-06-19 MED ORDER — DEXAMETHASONE SODIUM PHOSPHATE 10 MG/ML IJ SOLN
10.0000 mg | Freq: Once | INTRAMUSCULAR | Status: AC
  Administered 2013-06-19: 10 mg via INTRAVENOUS

## 2013-06-19 MED ORDER — DEXAMETHASONE SODIUM PHOSPHATE 10 MG/ML IJ SOLN
INTRAMUSCULAR | Status: AC
  Filled 2013-06-19: qty 1

## 2013-06-19 MED ORDER — ONDANSETRON 8 MG/50ML IVPB (CHCC)
8.0000 mg | Freq: Once | INTRAVENOUS | Status: AC
  Administered 2013-06-19: 8 mg via INTRAVENOUS

## 2013-06-19 NOTE — Patient Instructions (Signed)
Doxorubicin Liposomal injection What is this medicine? LIPOSOMAL DOXORUBICIN (LIP oh som al dox oh ROO bi sin) is a chemotherapy drug. This medicine is used to treat many kinds of cancer like Kaposi's sarcoma, multiple myeloma, and ovarian cancer. This medicine may be used for other purposes; ask your health care provider or pharmacist if you have questions. COMMON BRAND NAME(S): Doxil, Lipodox What should I tell my health care provider before I take this medicine? They need to know if you have any of these conditions: -blood disorders -heart disease -infection (especially a virus infection such as chickenpox, cold sores, or herpes) -liver disease -recent or ongoing radiation therapy -an unusual or allergic reaction to doxorubicin, other chemotherapy agents, soybeans, other medicines, foods, dyes, or preservatives -pregnant or trying to get pregnant -breast-feeding How should I use this medicine? This drug is given as an infusion into a vein. It is administered in a hospital or clinic by a specially trained health care professional. If you have pain, swelling, burning or any unusual feeling around the site of your injection, tell your health care professional right away. Talk to your pediatrician regarding the use of this medicine in children. Special care may be needed. Overdosage: If you think you have taken too much of this medicine contact a poison control center or emergency room at once. NOTE: This medicine is only for you. Do not share this medicine with others. What if I miss a dose? It is important not to miss your dose. Call your doctor or health care professional if you are unable to keep an appointment. What may interact with this medicine? Do not take this medicine with any of the following medications: -zidovudine This medicine may also interact with the following medications: -medicines to increase blood counts like filgrastim, pegfilgrastim, sargramostim -vaccines Talk to  your doctor or health care professional before taking any of these medicines: -acetaminophen -aspirin -ibuprofen -ketoprofen -naproxen This list may not describe all possible interactions. Give your health care provider a list of all the medicines, herbs, non-prescription drugs, or dietary supplements you use. Also tell them if you smoke, drink alcohol, or use illegal drugs. Some items may interact with your medicine. What should I watch for while using this medicine? Your condition will be monitored carefully while you are receiving this medicine. You will need important blood work done while you are taking this medicine. This drug may make you feel generally unwell. This is not uncommon, as chemotherapy can affect healthy cells as well as cancer cells. Report any side effects. Continue your course of treatment even though you feel ill unless your doctor tells you to stop. Your urine may turn orange-red for a few days after your dose. This is not blood. If your urine is dark or brown, call your doctor. In some cases, you may be given additional medicines to help with side effects. Follow all directions for their use. Call your doctor or health care professional for advice if you get a fever (100.5 degrees F or higher), chills or sore throat, or other symptoms of a cold or flu. Do not treat yourself. This drug decreases your body's ability to fight infections. Try to avoid being around people who are sick. This medicine may increase your risk to bruise or bleed. Call your doctor or health care professional if you notice any unusual bleeding. Be careful brushing and flossing your teeth or using a toothpick because you may get an infection or bleed more easily. If you have any dental  or bleed. Call your doctor or health care professional if you notice any unusual bleeding.  Be careful brushing and flossing your teeth or using a toothpick because you may get an infection or bleed more easily. If you have any dental work done, tell your dentist you are receiving this medicine.  Avoid taking products that contain aspirin, acetaminophen, ibuprofen, naproxen, or ketoprofen unless instructed by your doctor. These medicines may hide a  fever.  Men and women of childbearing age should use effective birth control methods while using taking this medicine. Do not become pregnant while taking this medicine. There is a potential for serious side effects to an unborn child. Talk to your health care professional or pharmacist for more information. Do not breast-feed an infant while taking this medicine.  Talk to your doctor about your risk of cancer. You may be more at risk for certain types of cancers if you take this medicine.  What side effects may I notice from receiving this medicine?  Side effects that you should report to your doctor or health care professional as soon as possible:  -allergic reactions like skin rash, itching or hives, swelling of the face, lips, or tongue  -low blood counts - this medicine may decrease the number of white blood cells, red blood cells and platelets. You may be at increased risk for infections and bleeding.  -signs of hand-foot syndrome - tingling or burning, redness, flaking, swelling, small blisters, or small sores on the palms of your hands or the soles of your feet  -signs of infection - fever or chills, cough, sore throat, pain or difficulty passing urine  -signs of decreased platelets or bleeding - bruising, pinpoint red spots on the skin, black, tarry stools, blood in the urine  -signs of decreased red blood cells - unusually weak or tired, fainting spells, lightheadedness  -back pain, chills, facial flushing, fever, headache, tightness in the chest or throat during the infusion  -breathing problems  -chest pain  -fast, irregular heartbeat  -mouth pain, redness, sores  -pain, swelling, redness at site where injected  -pain, tingling, numbness in the hands or feet  -swelling of ankles, feet, or hands  -vomiting  Side effects that usually do not require medical attention (report to your doctor or health care professional if they continue or are bothersome):  -diarrhea  -hair loss  -loss of appetite  -nail  discoloration or damage  -nausea  -red or watery eyes  -red colored urine  -stomach upset  This list may not describe all possible side effects. Call your doctor for medical advice about side effects. You may report side effects to FDA at 1-800-FDA-1088.  Where should I keep my medicine?  This drug is given in a hospital or clinic and will not be stored at home.  NOTE: This sheet is a summary. It may not cover all possible information. If you have questions about this medicine, talk to your doctor, pharmacist, or health care provider.   2014, Elsevier/Gold Standard. (2012-01-22 10:12:56)

## 2013-06-30 ENCOUNTER — Telehealth: Payer: Self-pay | Admitting: Hematology & Oncology

## 2013-06-30 NOTE — Telephone Encounter (Signed)
Pt cx 2-16 MD and tx said he had perirectal abcess lanced with drain put in at [facility] in [place], he has to go back on 2-26 to get drain removed. Per Dr. Marin Olp reschedule for 2 weeks. Pt is aware of 3-3 appointment here. Pt will fax ER report to [place] so Dr. Marin Olp can reveiw

## 2013-07-03 ENCOUNTER — Ambulatory Visit: Payer: Self-pay

## 2013-07-03 ENCOUNTER — Other Ambulatory Visit: Payer: Self-pay | Admitting: Lab

## 2013-07-03 ENCOUNTER — Ambulatory Visit: Payer: Self-pay | Admitting: Hematology & Oncology

## 2013-07-18 ENCOUNTER — Other Ambulatory Visit (HOSPITAL_BASED_OUTPATIENT_CLINIC_OR_DEPARTMENT_OTHER): Payer: No Typology Code available for payment source | Admitting: Lab

## 2013-07-18 ENCOUNTER — Encounter: Payer: Self-pay | Admitting: Hematology & Oncology

## 2013-07-18 ENCOUNTER — Ambulatory Visit (HOSPITAL_BASED_OUTPATIENT_CLINIC_OR_DEPARTMENT_OTHER): Payer: No Typology Code available for payment source | Admitting: Hematology & Oncology

## 2013-07-18 ENCOUNTER — Ambulatory Visit (HOSPITAL_BASED_OUTPATIENT_CLINIC_OR_DEPARTMENT_OTHER): Payer: No Typology Code available for payment source

## 2013-07-18 VITALS — BP 120/69 | HR 93 | Temp 98.6°F | Resp 18 | Ht 67.0 in | Wt 155.0 lb

## 2013-07-18 DIAGNOSIS — G4701 Insomnia due to medical condition: Secondary | ICD-10-CM

## 2013-07-18 DIAGNOSIS — B20 Human immunodeficiency virus [HIV] disease: Secondary | ICD-10-CM

## 2013-07-18 DIAGNOSIS — Z5111 Encounter for antineoplastic chemotherapy: Secondary | ICD-10-CM

## 2013-07-18 DIAGNOSIS — C469 Kaposi's sarcoma, unspecified: Secondary | ICD-10-CM

## 2013-07-18 LAB — COMPREHENSIVE METABOLIC PANEL
ALBUMIN: 4 g/dL (ref 3.5–5.2)
ALT: 16 U/L (ref 0–53)
AST: 22 U/L (ref 0–37)
Alkaline Phosphatase: 79 U/L (ref 39–117)
BILIRUBIN TOTAL: 0.4 mg/dL (ref 0.2–1.2)
BUN: 6 mg/dL (ref 6–23)
CO2: 26 meq/L (ref 19–32)
Calcium: 9.3 mg/dL (ref 8.4–10.5)
Chloride: 104 mEq/L (ref 96–112)
Creatinine, Ser: 0.91 mg/dL (ref 0.50–1.35)
Glucose, Bld: 94 mg/dL (ref 70–99)
POTASSIUM: 4.4 meq/L (ref 3.5–5.3)
SODIUM: 139 meq/L (ref 135–145)
TOTAL PROTEIN: 6.5 g/dL (ref 6.0–8.3)

## 2013-07-18 LAB — CBC WITH DIFFERENTIAL (CANCER CENTER ONLY)
BASO#: 0 10*3/uL (ref 0.0–0.2)
BASO%: 0.4 % (ref 0.0–2.0)
EOS%: 3.2 % (ref 0.0–7.0)
Eosinophils Absolute: 0.2 10*3/uL (ref 0.0–0.5)
HCT: 42.7 % (ref 38.7–49.9)
HGB: 14.1 g/dL (ref 13.0–17.1)
LYMPH#: 1.9 10*3/uL (ref 0.9–3.3)
LYMPH%: 27.4 % (ref 14.0–48.0)
MCH: 31.1 pg (ref 28.0–33.4)
MCHC: 33 g/dL (ref 32.0–35.9)
MCV: 94 fL (ref 82–98)
MONO#: 0.9 10*3/uL (ref 0.1–0.9)
MONO%: 12.8 % (ref 0.0–13.0)
NEUT%: 56.2 % (ref 40.0–80.0)
NEUTROS ABS: 3.9 10*3/uL (ref 1.5–6.5)
PLATELETS: 245 10*3/uL (ref 145–400)
RBC: 4.53 10*6/uL (ref 4.20–5.70)
RDW: 15.3 % (ref 11.1–15.7)
WBC: 6.9 10*3/uL (ref 4.0–10.0)

## 2013-07-18 MED ORDER — TRAZODONE HCL 100 MG PO TABS: 100.0000 mg | ORAL_TABLET | Freq: Every day | ORAL | Status: DC

## 2013-07-18 MED ORDER — ONDANSETRON 8 MG/50ML IVPB (CHCC)
8.0000 mg | Freq: Once | INTRAVENOUS | Status: AC
  Administered 2013-07-18: 8 mg via INTRAVENOUS

## 2013-07-18 MED ORDER — LORAZEPAM 0.5 MG PO TABS: 0.5000 mg | ORAL_TABLET | Freq: Four times a day (QID) | ORAL | Status: DC | PRN

## 2013-07-18 MED ORDER — DEXTROSE 5 % IV SOLN
20.0000 mg/m2 | Freq: Once | INTRAVENOUS | Status: AC
  Administered 2013-07-18: 36 mg via INTRAVENOUS
  Filled 2013-07-18: qty 18

## 2013-07-18 MED ORDER — DEXAMETHASONE SODIUM PHOSPHATE 10 MG/ML IJ SOLN
10.0000 mg | Freq: Once | INTRAMUSCULAR | Status: AC
  Administered 2013-07-18: 10 mg via INTRAVENOUS

## 2013-07-18 MED ORDER — DEXAMETHASONE SODIUM PHOSPHATE 10 MG/ML IJ SOLN
INTRAMUSCULAR | Status: AC
  Filled 2013-07-18: qty 1

## 2013-07-18 MED ORDER — SODIUM CHLORIDE 0.9 % IV SOLN
Freq: Once | INTRAVENOUS | Status: AC
  Administered 2013-07-18: 10:00:00 via INTRAVENOUS

## 2013-07-18 NOTE — Progress Notes (Signed)
  DIAGNOSIS:  Kaposi Sarcoma  HIV (+)   CURRENT THERAPY:  Doxil Q2 week dosing   INTERIM HISTORY: Todd Vincent comes in for followup. He is doing fairly well. His last dose of Doxil was about a month ago. Since then, he was hospitalized. He had a recurrent rectal abscess. This finally was drained. This hopefully will not be a problem for him again.  The Kaposi's lesions seem to be holding stable. He has not noted new lesions.  He continues on his HIV medications.  His appetite is okay. He has not had any problems with nausea vomiting. He has not had any cough. He has not had any bleeding. He has had leg swelling. He's not taking his diuretic. PHYSICAL EXAMINATION: Well-nourished African American gentleman. Vital signs show temperature of 98.6. Pulse 93. Blood pressure 120/69. Weight is 155 pounds. Head and neck exam shows ocular or oral lesions. No adenopathy is noted in the neck. No Kaposi's lesions are noted in the oral cavity. Lungs are clear. Cardiac exam regular rate and rhythm with no murmurs rubs or bruits. Abdomen is soft. Has good bowel sounds. There is no fluid wave. There is no palpable liver or spleen tip. Extremities shows 1-2+ edema in his legs. He has good range of motion of his joints. Skin exam shows a stable Kaposi's lesions. LABORATORY STUDIES:  White cell count 6.9. Hemoglobin 14.1. Platelet count 245.   IMPRESSION: Todd Vincent is a 38 year old African American gentleman. He is HIV positive. His HIV is under good control. He follows up with his infectious disease doctor in a couple weeks.  We'll go ahead with his treatment today. After this treatment, I will give him a break. I think he needs one.  I don't see any evidence of his Kaposi's worsening. They have responded nicely to the Doxil. I think that as long as his HIV is under good control, the Kaposi's really should not be a problem.  I'll plan to get him back to see me in another 6 weeks or  so.    [name], MD 07/18/2013

## 2013-07-18 NOTE — Patient Instructions (Signed)
Doxorubicin Liposomal injection What is this medicine? LIPOSOMAL DOXORUBICIN (LIP oh som al dox oh ROO bi sin) is a chemotherapy drug. This medicine is used to treat many kinds of cancer like Kaposi's sarcoma, multiple myeloma, and ovarian cancer. This medicine may be used for other purposes; ask your health care provider or pharmacist if you have questions. COMMON BRAND NAME(S): Doxil, Lipodox What should I tell my health care provider before I take this medicine? They need to know if you have any of these conditions: -blood disorders -heart disease -infection (especially a virus infection such as chickenpox, cold sores, or herpes) -liver disease -recent or ongoing radiation therapy -an unusual or allergic reaction to doxorubicin, other chemotherapy agents, soybeans, other medicines, foods, dyes, or preservatives -pregnant or trying to get pregnant -breast-feeding How should I use this medicine? This drug is given as an infusion into a vein. It is administered in a hospital or clinic by a specially trained health care professional. If you have pain, swelling, burning or any unusual feeling around the site of your injection, tell your health care professional right away. Talk to your pediatrician regarding the use of this medicine in children. Special care may be needed. Overdosage: If you think you have taken too much of this medicine contact a poison control center or emergency room at once. NOTE: This medicine is only for you. Do not share this medicine with others. What if I miss a dose? It is important not to miss your dose. Call your doctor or health care professional if you are unable to keep an appointment. What may interact with this medicine? Do not take this medicine with any of the following medications: -zidovudine This medicine may also interact with the following medications: -medicines to increase blood counts like filgrastim, pegfilgrastim, sargramostim -vaccines Talk to  your doctor or health care professional before taking any of these medicines: -acetaminophen -aspirin -ibuprofen -ketoprofen -naproxen This list may not describe all possible interactions. Give your health care provider a list of all the medicines, herbs, non-prescription drugs, or dietary supplements you use. Also tell them if you smoke, drink alcohol, or use illegal drugs. Some items may interact with your medicine. What should I watch for while using this medicine? Your condition will be monitored carefully while you are receiving this medicine. You will need important blood work done while you are taking this medicine. This drug may make you feel generally unwell. This is not uncommon, as chemotherapy can affect healthy cells as well as cancer cells. Report any side effects. Continue your course of treatment even though you feel ill unless your doctor tells you to stop. Your urine may turn orange-red for a few days after your dose. This is not blood. If your urine is dark or brown, call your doctor. In some cases, you may be given additional medicines to help with side effects. Follow all directions for their use. Call your doctor or health care professional for advice if you get a fever (100.5 degrees F or higher), chills or sore throat, or other symptoms of a cold or flu. Do not treat yourself. This drug decreases your body's ability to fight infections. Try to avoid being around people who are sick. This medicine may increase your risk to bruise or bleed. Call your doctor or health care professional if you notice any unusual bleeding. Be careful brushing and flossing your teeth or using a toothpick because you may get an infection or bleed more easily. If you have any dental  or bleed. Call your doctor or health care professional if you notice any unusual bleeding.  Be careful brushing and flossing your teeth or using a toothpick because you may get an infection or bleed more easily. If you have any dental work done, tell your dentist you are receiving this medicine.  Avoid taking products that contain aspirin, acetaminophen, ibuprofen, naproxen, or ketoprofen unless instructed by your doctor. These medicines may hide a  fever.  Men and women of childbearing age should use effective birth control methods while using taking this medicine. Do not become pregnant while taking this medicine. There is a potential for serious side effects to an unborn child. Talk to your health care professional or pharmacist for more information. Do not breast-feed an infant while taking this medicine.  Talk to your doctor about your risk of cancer. You may be more at risk for certain types of cancers if you take this medicine.  What side effects may I notice from receiving this medicine?  Side effects that you should report to your doctor or health care professional as soon as possible:  -allergic reactions like skin rash, itching or hives, swelling of the face, lips, or tongue  -low blood counts - this medicine may decrease the number of white blood cells, red blood cells and platelets. You may be at increased risk for infections and bleeding.  -signs of hand-foot syndrome - tingling or burning, redness, flaking, swelling, small blisters, or small sores on the palms of your hands or the soles of your feet  -signs of infection - fever or chills, cough, sore throat, pain or difficulty passing urine  -signs of decreased platelets or bleeding - bruising, pinpoint red spots on the skin, black, tarry stools, blood in the urine  -signs of decreased red blood cells - unusually weak or tired, fainting spells, lightheadedness  -back pain, chills, facial flushing, fever, headache, tightness in the chest or throat during the infusion  -breathing problems  -chest pain  -fast, irregular heartbeat  -mouth pain, redness, sores  -pain, swelling, redness at site where injected  -pain, tingling, numbness in the hands or feet  -swelling of ankles, feet, or hands  -vomiting  Side effects that usually do not require medical attention (report to your doctor or health care professional if they continue or are bothersome):  -diarrhea  -hair loss  -loss of appetite  -nail  discoloration or damage  -nausea  -red or watery eyes  -red colored urine  -stomach upset  This list may not describe all possible side effects. Call your doctor for medical advice about side effects. You may report side effects to FDA at 1-800-FDA-1088.  Where should I keep my medicine?  This drug is given in a hospital or clinic and will not be stored at home.  NOTE: This sheet is a summary. It may not cover all possible information. If you have questions about this medicine, talk to your doctor, pharmacist, or health care provider.   2014, Elsevier/Gold Standard. (2012-01-22 10:12:56)

## 2013-07-25 ENCOUNTER — Other Ambulatory Visit: Payer: Self-pay | Admitting: Internal Medicine

## 2013-07-31 ENCOUNTER — Ambulatory Visit (HOSPITAL_COMMUNITY)
Admission: RE | Admit: 2013-07-31 | Discharge: 2013-07-31 | Disposition: A | Discharge status: Home / Self Care | Payer: No Typology Code available for payment source | Source: Ambulatory Visit | Attending: Hematology & Oncology | Admitting: Hematology & Oncology

## 2013-07-31 DIAGNOSIS — Z87891 Personal history of nicotine dependence: Secondary | ICD-10-CM | POA: Insufficient documentation

## 2013-07-31 DIAGNOSIS — F3289 Other specified depressive episodes: Secondary | ICD-10-CM | POA: Insufficient documentation

## 2013-07-31 DIAGNOSIS — B20 Human immunodeficiency virus [HIV] disease: Secondary | ICD-10-CM | POA: Insufficient documentation

## 2013-07-31 DIAGNOSIS — F329 Major depressive disorder, single episode, unspecified: Secondary | ICD-10-CM | POA: Insufficient documentation

## 2013-07-31 DIAGNOSIS — C469 Kaposi's sarcoma, unspecified: Secondary | ICD-10-CM

## 2013-07-31 DIAGNOSIS — Z09 Encounter for follow-up examination after completed treatment for conditions other than malignant neoplasm: Secondary | ICD-10-CM

## 2013-07-31 NOTE — Progress Notes (Signed)
  Echocardiogram 2D Echocardiogram limited has been performed.  Robertson Colclough 07/31/2013, 10:59 AM

## 2013-08-07 ENCOUNTER — Other Ambulatory Visit: Payer: Self-pay

## 2013-08-08 ENCOUNTER — Other Ambulatory Visit (INDEPENDENT_AMBULATORY_CARE_PROVIDER_SITE_OTHER): Payer: No Typology Code available for payment source

## 2013-08-08 DIAGNOSIS — B20 Human immunodeficiency virus [HIV] disease: Secondary | ICD-10-CM

## 2013-08-09 LAB — HIV-1 RNA QUANT-NO REFLEX-BLD
HIV 1 RNA Quant: <20 copies/mL (ref <20)
HIV-1 RNA Quant, Log: <1.3 {Log} (ref <1.30)

## 2013-08-09 LAB — T-HELPER CELL (CD4) - (RCID CLINIC ONLY)
CD4 T CELL HELPER: 17 % — AB (ref 33–55)
CD4 T Cell Abs: 260 /uL — ABNORMAL LOW (ref 400–2700)

## 2013-08-22 ENCOUNTER — Other Ambulatory Visit: Payer: Self-pay | Admitting: Internal Medicine

## 2013-08-22 DIAGNOSIS — F32A Depression, unspecified: Secondary | ICD-10-CM

## 2013-08-22 DIAGNOSIS — F329 Major depressive disorder, single episode, unspecified: Secondary | ICD-10-CM

## 2013-08-29 ENCOUNTER — Ambulatory Visit (INDEPENDENT_AMBULATORY_CARE_PROVIDER_SITE_OTHER): Payer: No Typology Code available for payment source | Admitting: Internal Medicine

## 2013-08-29 ENCOUNTER — Other Ambulatory Visit: Payer: Self-pay | Admitting: Nurse Practitioner

## 2013-08-29 ENCOUNTER — Encounter: Payer: Self-pay | Admitting: Internal Medicine

## 2013-08-29 VITALS — BP 134/86 | HR 109 | Temp 98.2°F | Ht 67.0 in | Wt 144.0 lb

## 2013-08-29 DIAGNOSIS — B20 Human immunodeficiency virus [HIV] disease: Secondary | ICD-10-CM

## 2013-08-29 DIAGNOSIS — K611 Rectal abscess: Secondary | ICD-10-CM

## 2013-08-29 DIAGNOSIS — C469 Kaposi's sarcoma, unspecified: Secondary | ICD-10-CM

## 2013-08-29 DIAGNOSIS — K612 Anorectal abscess: Secondary | ICD-10-CM

## 2013-08-29 NOTE — Assessment & Plan Note (Signed)
Continues to seesurgeon in [place] and no issues at this time.

## 2013-08-29 NOTE — Assessment & Plan Note (Signed)
Doing well on salvage regimen.  Will conitnue and rtc in 3 months.

## 2013-08-29 NOTE — Assessment & Plan Note (Signed)
Continuing with oncology.

## 2013-08-29 NOTE — Progress Notes (Signed)
  Subjective:    Patient ID: Todd Vincent, male    DOB: 05-24-1975, 38 y.o.   MRN: [id number]  HPI  He comes in for routine follow up  He continues on Isentress and truvada, prezista, norvir and denies any missed doses.  He also is on doxil for Kaposi's sarcoma and is restarting again after a month break.   His CD4 is up to 260 .  No new issues.  No weight loss, no diarrhea. I saw him for depression last visit and started him on an SSRI.  He feels that it has helped.  Some weight loss with chemo, but Ensure can upset his stomach.  No new complaints.    Review of Systems  Constitutional: Negative for fever and chills.  HENT: Negative for sore throat and trouble swallowing.   Eyes: Negative for visual disturbance.  Respiratory: Negative for cough and shortness of breath.   Cardiovascular: Negative for chest pain.  Gastrointestinal: Negative for nausea and diarrhea.  Musculoskeletal: Negative for arthralgias and myalgias.  Skin: Positive for rash.       [place]  Neurological: Negative for dizziness, light-headedness and headaches.  Hematological: Negative for adenopathy.  Psychiatric/Behavioral: Negative for dysphoric mood.       Objective:   Physical Exam  Constitutional: He appears well-developed and well-nourished. No distress.  HENT:  Mouth/Throat: No oropharyngeal exudate.  Eyes: No scleral icterus.  Cardiovascular: Normal rate, regular rhythm and normal heart sounds.   No murmur heard. Pulmonary/Chest: Effort normal and breath sounds normal. He has no wheezes.  Lymphadenopathy:    He has no cervical adenopathy.  Neurological: He is alert.  Skin: Rash noted.  KS  Psychiatric: He has a normal mood and affect. His behavior is normal.          Assessment & Plan:

## 2013-08-30 ENCOUNTER — Other Ambulatory Visit (HOSPITAL_BASED_OUTPATIENT_CLINIC_OR_DEPARTMENT_OTHER): Payer: No Typology Code available for payment source | Admitting: Lab

## 2013-08-30 ENCOUNTER — Ambulatory Visit (HOSPITAL_BASED_OUTPATIENT_CLINIC_OR_DEPARTMENT_OTHER): Payer: No Typology Code available for payment source | Admitting: Hematology & Oncology

## 2013-08-30 VITALS — BP 119/79 | HR 88 | Temp 98.2°F | Wt 153.0 lb

## 2013-08-30 DIAGNOSIS — C469 Kaposi's sarcoma, unspecified: Secondary | ICD-10-CM

## 2013-08-30 DIAGNOSIS — B20 Human immunodeficiency virus [HIV] disease: Secondary | ICD-10-CM

## 2013-08-30 LAB — CBC WITH DIFFERENTIAL (CANCER CENTER ONLY)
BASO#: 0 10*3/uL (ref 0.0–0.2)
BASO%: 0.3 % (ref 0.0–2.0)
EOS%: 3.8 % (ref 0.0–7.0)
Eosinophils Absolute: 0.3 10*3/uL (ref 0.0–0.5)
HEMATOCRIT: 36.8 % — AB (ref 38.7–49.9)
HEMOGLOBIN: 12.8 g/dL — AB (ref 13.0–17.1)
LYMPH#: 1.8 10*3/uL (ref 0.9–3.3)
LYMPH%: 25.6 % (ref 14.0–48.0)
MCH: 33.2 pg (ref 28.0–33.4)
MCHC: 34.8 g/dL (ref 32.0–35.9)
MCV: 95 fL (ref 82–98)
MONO#: 0.8 10*3/uL (ref 0.1–0.9)
MONO%: 11.6 % (ref 0.0–13.0)
NEUT#: 4.1 10*3/uL (ref 1.5–6.5)
NEUT%: 58.7 % (ref 40.0–80.0)
Platelets: 251 10*3/uL (ref 145–400)
RBC: 3.86 10*6/uL — ABNORMAL LOW (ref 4.20–5.70)
RDW: 16.1 % — AB (ref 11.1–15.7)
WBC: 6.9 10*3/uL (ref 4.0–10.0)

## 2013-08-30 LAB — CMP (CANCER CENTER ONLY)
ALK PHOS: 81 U/L (ref 26–84)
ALT(SGPT): 15 U/L (ref 10–47)
AST: 19 U/L (ref 11–38)
Albumin: 3.5 g/dL (ref 3.3–5.5)
BUN, Bld: 7 mg/dL (ref 7–22)
CALCIUM: 9 mg/dL (ref 8.0–10.3)
CO2: 31 mEq/L (ref 18–33)
CREATININE: 0.8 mg/dL (ref 0.6–1.2)
Chloride: 103 mEq/L (ref 98–108)
Glucose, Bld: 105 mg/dL (ref 73–118)
Potassium: 3.8 mEq/L (ref 3.3–4.7)
Sodium: 142 mEq/L (ref 128–145)
Total Bilirubin: 0.6 mg/dl (ref 0.20–1.60)
Total Protein: 6.8 g/dL (ref 6.4–8.1)

## 2013-08-30 NOTE — Progress Notes (Signed)
Hematology and Oncology Follow Up Visit  Todd Vincent [id number] 1975/11/01 38 y.o. 08/30/2013   Principle Diagnosis:   Kaposi's sarcoma  HIV-asymptomatic  Current Therapy:    Observation     Interim History:  Todd Vincent is back for followup. We now have him off chemotherapy. He was on Doxil. His last dose of Doxil was back in February. Since then he's been doing okay. He's having no problems with the HIV. He is on a "cocktail" of agents.  He is not having any rectal issues. He still sees the surgeon for this. But, he's had no issues with respect to recurrent asked disease.  Has not noted any skin changes with the Kaposi's. He says some lesions have gotten better. Some have gotten a little darker.  Has not any cough or shortness of breath. She's had no bleeding. He's had no change in leg swelling. He's had no fever.  Medications: Current outpatient prescriptions:citalopram (CELEXA) 10 MG tablet, TAKE 1 TABLET BY MOUTH DAILY., Disp: 30 tablet, Rfl: 5;  Darunavir Ethanolate (PREZISTA) 800 MG tablet, Take 1 tablet (800 mg total) by mouth daily with breakfast., Disp: 30 tablet, Rfl: 6;  emtricitabine-tenofovir (TRUVADA) 200-300 MG per tablet, Take 1 tablet by mouth daily., Disp: 30 tablet, Rfl: 6 ibuprofen (ADVIL,MOTRIN) 200 MG tablet, Take 400 mg by mouth every 8 (eight) hours as needed. For pain, Disp: , Rfl: ;  LORazepam (ATIVAN) 0.5 MG tablet, Take 1 tablet (0.5 mg total) by mouth every 6 (six) hours as needed (Nausea or vomiting)., Disp: 30 tablet, Rfl: 2 ondansetron (ZOFRAN) 8 MG tablet, Take 1 tablet (8 mg total) by mouth 2 (two) times daily. Take two times a day starting the day after chemo for 2 days. Then take two times a day as needed for nausea or vomiting., Disp: 30 tablet, Rfl: 1;  prochlorperazine (COMPAZINE) 10 MG tablet, Take 1 tablet (10 mg total) by mouth every 6 (six) hours as needed (Nausea or vomiting)., Disp: 30 tablet, Rfl: 1 raltegravir (ISENTRESS) 400 MG tablet,  TAKE 1 TABLET BY MOUTH TWICE DAILY, Disp: 60 tablet, Rfl: 6;  ritonavir (NORVIR) 100 MG TABS tablet, TAKE 1 TABLET BY MOUTH DAILY WITH BREAKFAST, Disp: 30 tablet, Rfl: 6;  traZODone (DESYREL) 100 MG tablet, Take 1 tablet (100 mg total) by mouth at bedtime., Disp: 30 tablet, Rfl: 3 triamterene-hydrochlorothiazide (MAXZIDE-25) 37.5-25 MG per tablet, Take 1 tablet daily if needed for leg swelling, Disp: 30 tablet, Rfl: 4  Allergies:  Allergies  Allergen Reactions  . Aspirin   . Sulfa Antibiotics Rash    Extreme itching     Past Medical History, Surgical history, Social history, and Family History were reviewed and updated.  Review of Systems: As above  Physical Exam:  weight is 153 lb (69.4 kg). His oral temperature is 98.2 F (36.8 C). His blood pressure is 119/79 and his pulse is 88.   Well-developed well-nourished African American gentleman. Head and neck exam shows no ocular or oral lesions. He has no adenopathy. Lungs are clear bilaterally. Cardiac exam regular rate and rhythm with no murmurs rubs or bruits. Abdomen is soft. Has good bowel sounds. There is no palpable liver or spleen to. Back exam no tenderness over the spine ribs or hips. Extremities shows a chronic nonpitting edema in his lower legs. Skin exam does show the Kaposi's lesions. They appear to be stable. Neurological exam no focal deficits.  Lab Results  Component Value Date   WBC 6.9 08/30/2013   HGB  12.8* 08/30/2013   HCT 36.8* 08/30/2013   MCV 95 08/30/2013   PLT 251 08/30/2013     Chemistry      Component Value Date/Time   NA 139 07/18/2013 0841   NA 142 05/08/2013 1320   K 4.4 07/18/2013 0841   K 4.4 05/08/2013 1320   CL 104 07/18/2013 0841   CL 100 05/08/2013 1320   CO2 26 07/18/2013 0841   CO2 31 05/08/2013 1320   BUN 6 07/18/2013 0841   BUN 8 05/08/2013 1320   CREATININE 0.91 07/18/2013 0841   CREATININE 1.1 05/08/2013 1320      Component Value Date/Time   CALCIUM 9.3 07/18/2013 0841   CALCIUM 9.5 05/08/2013 1320    ALKPHOS 79 07/18/2013 0841   ALKPHOS 68 05/08/2013 1320   AST 22 07/18/2013 0841   AST 20 05/08/2013 1320   ALT 16 07/18/2013 0841   ALT 23 05/08/2013 1320   BILITOT 0.4 07/18/2013 0841   BILITOT 1.00 05/08/2013 1320         Impression and Plan: Todd Vincent is a 38 year old African American gentleman. He is HIV positive. His Kaposi's. He has been treated for the [place]. His disease is stable. He's asymptomatic. I feel we can continue to follow him along. I don't see that for him right now is going to be to his benefit. I think we would just add to toxicity.  I want to see him back in 2 months. I don't see that we have to do any scans on him right now. Todd Vincent is glad that he's done well and that he is able to enjoy himself.  Of note, his last echocardiogram was done in March of 2015. Shows an ejection fraction of 50-55%.  [name], MD 4/15/20151:32 PM

## 2013-09-14 ENCOUNTER — Ambulatory Visit (INDEPENDENT_AMBULATORY_CARE_PROVIDER_SITE_OTHER): Payer: No Typology Code available for payment source | Admitting: Internal Medicine

## 2013-09-14 ENCOUNTER — Encounter: Payer: Self-pay | Admitting: Internal Medicine

## 2013-09-14 VITALS — BP 141/91 | HR 105 | Temp 98.6°F | Wt 150.0 lb

## 2013-09-14 DIAGNOSIS — B029 Zoster without complications: Secondary | ICD-10-CM

## 2013-09-14 DIAGNOSIS — C469 Kaposi's sarcoma, unspecified: Secondary | ICD-10-CM

## 2013-09-14 NOTE — Progress Notes (Signed)
   Subjective:    Patient ID: Todd Vincent, male    DOB: 20-Nov-1975, 38 y.o.   MRN: [id number]  HPI Diagnosed recently with shingles.  Intially on groin and recently on right side of head.  Diagnosed at [facility] in [place].  Drying up now.  Painful at night and takes a percocet if needed.    Review of Systems  Constitutional: Negative for fatigue.  Gastrointestinal: Negative for diarrhea.       Mild diarrhea with acyclovir  Skin: Negative for rash.       shingls       Objective:   Physical Exam  Constitutional: He appears well-developed and well-nourished. No distress.  HENT:  Has several dried lesions in a dermatomal area right side of head  Lymphadenopathy:    He has no cervical adenopathy.  Skin: Rash noted.  Rash of KS          Assessment & Plan:

## 2013-09-14 NOTE — Assessment & Plan Note (Signed)
Continuing with chemo next month

## 2013-09-14 NOTE — Assessment & Plan Note (Signed)
On acyclovir.  Resolving.

## 2013-09-18 ENCOUNTER — Other Ambulatory Visit: Payer: Self-pay | Admitting: Infectious Diseases

## 2013-09-18 DIAGNOSIS — B20 Human immunodeficiency virus [HIV] disease: Secondary | ICD-10-CM

## 2013-11-01 ENCOUNTER — Encounter: Payer: Self-pay | Admitting: Hematology & Oncology

## 2013-11-01 ENCOUNTER — Other Ambulatory Visit (HOSPITAL_BASED_OUTPATIENT_CLINIC_OR_DEPARTMENT_OTHER): Payer: No Typology Code available for payment source | Admitting: Lab

## 2013-11-01 ENCOUNTER — Ambulatory Visit (HOSPITAL_BASED_OUTPATIENT_CLINIC_OR_DEPARTMENT_OTHER): Payer: No Typology Code available for payment source | Admitting: Hematology & Oncology

## 2013-11-01 VITALS — BP 144/71 | HR 71 | Temp 98.5°F | Resp 16 | Ht 67.0 in | Wt 149.0 lb

## 2013-11-01 DIAGNOSIS — C469 Kaposi's sarcoma, unspecified: Secondary | ICD-10-CM

## 2013-11-01 DIAGNOSIS — B20 Human immunodeficiency virus [HIV] disease: Secondary | ICD-10-CM

## 2013-11-01 DIAGNOSIS — E559 Vitamin D deficiency, unspecified: Secondary | ICD-10-CM

## 2013-11-01 LAB — COMPREHENSIVE METABOLIC PANEL
ALK PHOS: 88 U/L (ref 39–117)
ALT: 9 U/L (ref 0–53)
AST: 13 U/L (ref 0–37)
Albumin: 4.3 g/dL (ref 3.5–5.2)
BILIRUBIN TOTAL: 0.3 mg/dL (ref 0.2–1.2)
BUN: 7 mg/dL (ref 6–23)
CO2: 30 meq/L (ref 19–32)
Calcium: 9.5 mg/dL (ref 8.4–10.5)
Chloride: 105 mEq/L (ref 96–112)
Creatinine, Ser: 1.18 mg/dL (ref 0.50–1.35)
GLUCOSE: 103 mg/dL — AB (ref 70–99)
Potassium: 3.8 mEq/L (ref 3.5–5.3)
Sodium: 140 mEq/L (ref 135–145)
Total Protein: 6.7 g/dL (ref 6.0–8.3)

## 2013-11-01 LAB — CBC WITH DIFFERENTIAL (CANCER CENTER ONLY)
BASO#: 0 10*3/uL (ref 0.0–0.2)
BASO%: 0.4 % (ref 0.0–2.0)
EOS ABS: 0.1 10*3/uL (ref 0.0–0.5)
EOS%: 2 % (ref 0.0–7.0)
HCT: 44.9 % (ref 38.7–49.9)
HEMOGLOBIN: 15.6 g/dL (ref 13.0–17.1)
LYMPH#: 1.8 10*3/uL (ref 0.9–3.3)
LYMPH%: 25.6 % (ref 14.0–48.0)
MCH: 31.4 pg (ref 28.0–33.4)
MCHC: 34.7 g/dL (ref 32.0–35.9)
MCV: 90 fL (ref 82–98)
MONO#: 0.5 10*3/uL (ref 0.1–0.9)
MONO%: 7.2 % (ref 0.0–13.0)
NEUT#: 4.5 10*3/uL (ref 1.5–6.5)
NEUT%: 64.8 % (ref 40.0–80.0)
Platelets: 270 10*3/uL (ref 145–400)
RBC: 4.97 10*6/uL (ref 4.20–5.70)
RDW: 13 % (ref 11.1–15.7)
WBC: 7 10*3/uL (ref 4.0–10.0)

## 2013-11-01 LAB — LACTATE DEHYDROGENASE: LDH: 149 U/L (ref 94–250)

## 2013-11-01 MED ORDER — TRIAMTERENE-HCTZ 37.5-25 MG PO TABS: ORAL_TABLET | ORAL | Status: DC

## 2013-11-01 NOTE — Progress Notes (Signed)
Hematology and Oncology Follow Up Visit  BUREN HAVEY [id number] November 20, 1975 38 y.o. 11/01/2013   Principle Diagnosis:   Kaposi's sarcoma  HIV-asymptomatic  Current Therapy:    Observation     Interim History:  Mr.  Harbaugh is back for followup. We saw him 2 months ago. Since then, he's been doing quite well. He apparently has had some shingles. This. The on the right arm around his elbow. He was placed on antiviral medicine. He took it for about a week.  He sees Dr. Linus Salmons who does his HIV monitoring. Currently the HIV is under very good control. He really has not noted much in way of change with his Kaposi's lesions. We have not treated him for over 4 months.  He's had no shortness of breath. He's had no fever. He's had no cough. His appetite is good with no nausea vomiting.  Had no change in bowel or bladder habits. He did have a time where he had a rectal abscess that following that surgery to take care of it. And since then, he has had no problems.  He's had no weight loss. He's had no mouth sores. He's had no visual problem.Vashti Hey he has some vertigo. I am not sure as to why this is.    Medications: Current outpatient prescriptions:acyclovir (ZOVIRAX) 800 MG tablet, Take 800 mg by mouth 5 (five) times daily., Disp: , Rfl: ;  citalopram (CELEXA) 10 MG tablet, TAKE 1 TABLET BY MOUTH DAILY., Disp: 30 tablet, Rfl: 5;  COCONUT OIL PO, Take 1,000 mg by mouth 2 (two) times daily., Disp: , Rfl: ;  ISENTRESS 400 MG tablet, TAKE 1 TABLET BY MOUTH TWICE DAILY, Disp: 60 tablet, Rfl: 5 LORazepam (ATIVAN) 0.5 MG tablet, Take 1 tablet (0.5 mg total) by mouth every 6 (six) hours as needed (Nausea or vomiting)., Disp: 30 tablet, Rfl: 2;  NORVIR 100 MG TABS tablet, TAKE 1 TABLET BY MOUTH DAILY AFTER BREAKFAST, Disp: 30 tablet, Rfl: 5 ondansetron (ZOFRAN) 8 MG tablet, Take 1 tablet (8 mg total) by mouth 2 (two) times daily. Take two times a day starting the day after chemo for 2 days. Then take two  times a day as needed for nausea or vomiting., Disp: 30 tablet, Rfl: 1;  PREZISTA 800 MG tablet, TAKE 1 TABLET BY MOUTH EVERY DAY WITH BREAKFAST, Disp: 30 tablet, Rfl: 5 prochlorperazine (COMPAZINE) 10 MG tablet, Take 1 tablet (10 mg total) by mouth every 6 (six) hours as needed (Nausea or vomiting)., Disp: 30 tablet, Rfl: 1;  traZODone (DESYREL) 100 MG tablet, Take 1 tablet (100 mg total) by mouth at bedtime., Disp: 30 tablet, Rfl: 3;  triamterene-hydrochlorothiazide (MAXZIDE-25) 37.5-25 MG per tablet, Take 1 tablet daily if needed for leg swelling, Disp: 30 tablet, Rfl: 6 TRUVADA 200-300 MG per tablet, TAKE 1 TABLET BY MOUTH EVERY DAY, Disp: 30 tablet, Rfl: 5  Allergies:  Allergies  Allergen Reactions  . Aspirin   . Sulfa Antibiotics Rash    Extreme itching     Past Medical History, Surgical history, Social history, and Family History were reviewed and updated.  Review of Systems: As above  Physical Exam:  height is 5\' 7"  (1.702 m) and weight is 149 lb (67.586 kg). His oral temperature is 98.5 F (36.9 C). His blood pressure is 144/71 and his pulse is 71. His respiration is 16.   Well-developed and well-nourished Afro-American male. Head and neck exam shows no ocular or oral lesions. He has no palpable cervical or supraclavicular  lymph nodes. Lungs are clear. Cardiac exam regular rate and rhythm with no murmurs rubs or bruits. Abdomen is soft. Has good bowel sounds. There is no fluid wave. There is no palpable liver or spleen tip. Neck exam shows no tenderness over the spine ribs or hips. Extremities shows some 1+ nonpitting edema in his legs. His good range motion of his joints. Has good strength in extremities. Skin exam does show the Kaposi's lesions. They are mostly macular in nature. They are somewhat hyperpigmented but do not appear to be growing or any new lesions are noted. Neurological exam shows no focal deficits. I see no nice diagnosed.  Lab Results  Component Value Date   WBC  7.0 11/01/2013   HGB 15.6 11/01/2013   HCT 44.9 11/01/2013   MCV 90 11/01/2013   PLT 270 11/01/2013     Chemistry      Component Value Date/Time   NA 142 08/30/2013 1141   NA 139 07/18/2013 0841   K 3.8 08/30/2013 1141   K 4.4 07/18/2013 0841   CL 103 08/30/2013 1141   CL 104 07/18/2013 0841   CO2 31 08/30/2013 1141   CO2 26 07/18/2013 0841   BUN 7 08/30/2013 1141   BUN 6 07/18/2013 0841   CREATININE 0.8 08/30/2013 1141   CREATININE 0.91 07/18/2013 0841      Component Value Date/Time   CALCIUM 9.0 08/30/2013 1141   CALCIUM 9.3 07/18/2013 0841   ALKPHOS 81 08/30/2013 1141   ALKPHOS 79 07/18/2013 0841   AST 19 08/30/2013 1141   AST 22 07/18/2013 0841   ALT 15 08/30/2013 1141   ALT 16 07/18/2013 0841   BILITOT 0.60 08/30/2013 1141   BILITOT 0.4 07/18/2013 0841         Impression and Plan: Mr. Musich is 38 year old African gentleman. He has history of Kaposi's. We treated him with Doxil. He did well with Doxil. Doxil did work quite well.  He is stable. He is asymptomatic. I don't think we have to initiate any therapy on him.  Final see any cardiac effects from the Doxil. He does not need an echocardiogram right now.  We will plan to get him back in another 2 months.  If he does begin to show progression of the Kaposi's, we can certainly retreat him.   [name], MD 6/17/20151:05 PM

## 2013-11-02 ENCOUNTER — Telehealth: Payer: Self-pay | Admitting: *Deleted

## 2013-11-02 NOTE — Telephone Encounter (Addendum)
Message copied by Lenn Sink on Thu Nov 02, 2013  8:53 AM ------      Message from: Burney Gauze R      Created: Wed Nov 01, 2013  5:49 PM       CALL - LABS ARE OK!!  PETE ------Informed pt that labs are good!

## 2013-11-15 ENCOUNTER — Other Ambulatory Visit: Payer: Self-pay | Admitting: Hematology & Oncology

## 2013-11-28 ENCOUNTER — Other Ambulatory Visit (INDEPENDENT_AMBULATORY_CARE_PROVIDER_SITE_OTHER): Payer: No Typology Code available for payment source

## 2013-11-28 ENCOUNTER — Ambulatory Visit: Payer: No Typology Code available for payment source

## 2013-11-28 DIAGNOSIS — B20 Human immunodeficiency virus [HIV] disease: Secondary | ICD-10-CM

## 2013-11-29 LAB — T-HELPER CELL (CD4) - (RCID CLINIC ONLY)
CD4 % Helper T Cell: 12 % — ABNORMAL LOW (ref 33–55)
CD4 T Cell Abs: 300 /uL — ABNORMAL LOW (ref 400–2700)

## 2013-11-30 LAB — HIV-1 RNA QUANT-NO REFLEX-BLD: HIV 1 RNA Quant: <20 copies/mL (ref <20)

## 2013-12-11 ENCOUNTER — Other Ambulatory Visit: Payer: Self-pay | Admitting: *Deleted

## 2013-12-11 DIAGNOSIS — B20 Human immunodeficiency virus [HIV] disease: Secondary | ICD-10-CM

## 2013-12-11 MED ORDER — RALTEGRAVIR POTASSIUM 400 MG PO TABS: ORAL_TABLET | ORAL | Status: DC

## 2013-12-11 MED ORDER — EMTRICITABINE-TENOFOVIR DF 200-300 MG PO TABS: ORAL_TABLET | ORAL | Status: DC

## 2013-12-11 MED ORDER — DARUNAVIR ETHANOLATE 800 MG PO TABS: ORAL_TABLET | ORAL | Status: DC

## 2013-12-11 MED ORDER — RITONAVIR 100 MG PO TABS: ORAL_TABLET | ORAL | Status: DC

## 2013-12-11 NOTE — Telephone Encounter (Signed)
ADAP Application 

## 2013-12-14 ENCOUNTER — Other Ambulatory Visit: Payer: Self-pay | Admitting: Hematology & Oncology

## 2013-12-18 ENCOUNTER — Telehealth: Payer: Self-pay | Admitting: Hematology & Oncology

## 2013-12-18 NOTE — Telephone Encounter (Signed)
Pt moved 8-20 to 8-5

## 2013-12-19 ENCOUNTER — Ambulatory Visit (INDEPENDENT_AMBULATORY_CARE_PROVIDER_SITE_OTHER): Payer: Self-pay | Admitting: Internal Medicine

## 2013-12-19 ENCOUNTER — Encounter: Payer: Self-pay | Admitting: Internal Medicine

## 2013-12-19 VITALS — BP 128/85 | HR 94 | Temp 98.3°F | Wt 145.0 lb

## 2013-12-19 DIAGNOSIS — B20 Human immunodeficiency virus [HIV] disease: Secondary | ICD-10-CM

## 2013-12-19 DIAGNOSIS — Z79899 Other long term (current) drug therapy: Secondary | ICD-10-CM

## 2013-12-19 DIAGNOSIS — Z113 Encounter for screening for infections with a predominantly sexual mode of transmission: Secondary | ICD-10-CM

## 2013-12-19 NOTE — Assessment & Plan Note (Signed)
Doing good with salvage regimen. RTC 4 months.

## 2013-12-19 NOTE — Progress Notes (Signed)
  Subjective:    Patient ID: Todd Vincent, male    DOB: [birth year], 38 y.o.   MRN: [id number]  HPI  He comes in for routine follow up  He continues on Isentress and truvada, prezista, norvir and denies any missed doses.  He also is on doxil for Kaposi's sarcoma and is restarting again in September.   His CD4 is 300.  No new issues.  No weight loss, no diarrhea.  Weight is stable.  No new complaints.    Review of Systems  Constitutional: Negative for fever and chills.  HENT: Negative for sore throat and trouble swallowing.   Eyes: Negative for visual disturbance.  Respiratory: Negative for cough and shortness of breath.   Cardiovascular: Negative for chest pain.  Gastrointestinal: Negative for nausea and diarrhea.  Musculoskeletal: Negative for arthralgias and myalgias.  Skin: Positive for rash.       KS  Neurological: Negative for dizziness, light-headedness and headaches.  Hematological: Negative for adenopathy.  Psychiatric/Behavioral: Negative for dysphoric mood.       Objective:   Physical Exam  Constitutional: He appears well-developed and well-nourished. No distress.  HENT:  Mouth/Throat: No oropharyngeal exudate.  Eyes: No scleral icterus.  Cardiovascular: Normal rate, regular rhythm and normal heart sounds.   No murmur heard. Pulmonary/Chest: Effort normal and breath sounds normal. He has no wheezes.  Lymphadenopathy:    He has no cervical adenopathy.  Neurological: He is alert.  Skin: Rash noted.  KS  Psychiatric: He has a normal mood and affect. His behavior is normal.          Assessment & Plan:

## 2013-12-20 ENCOUNTER — Other Ambulatory Visit (HOSPITAL_BASED_OUTPATIENT_CLINIC_OR_DEPARTMENT_OTHER): Payer: Self-pay | Admitting: Lab

## 2013-12-20 ENCOUNTER — Encounter: Payer: Self-pay | Admitting: Hematology & Oncology

## 2013-12-20 ENCOUNTER — Ambulatory Visit (HOSPITAL_BASED_OUTPATIENT_CLINIC_OR_DEPARTMENT_OTHER): Payer: Self-pay | Admitting: Hematology & Oncology

## 2013-12-20 VITALS — BP 119/64 | HR 77 | Temp 98.1°F | Resp 16 | Ht 67.0 in | Wt 144.0 lb

## 2013-12-20 DIAGNOSIS — B20 Human immunodeficiency virus [HIV] disease: Secondary | ICD-10-CM

## 2013-12-20 DIAGNOSIS — C469 Kaposi's sarcoma, unspecified: Secondary | ICD-10-CM

## 2013-12-20 DIAGNOSIS — E559 Vitamin D deficiency, unspecified: Secondary | ICD-10-CM

## 2013-12-20 LAB — CBC WITH DIFFERENTIAL (CANCER CENTER ONLY)
BASO#: 0 10*3/uL (ref 0.0–0.2)
BASO%: 0.4 % (ref 0.0–2.0)
EOS%: 2.7 % (ref 0.0–7.0)
Eosinophils Absolute: 0.2 10*3/uL (ref 0.0–0.5)
HCT: 44.6 % (ref 38.7–49.9)
HGB: 15.5 g/dL (ref 13.0–17.1)
LYMPH#: 2.3 10*3/uL (ref 0.9–3.3)
LYMPH%: 34.3 % (ref 14.0–48.0)
MCH: 30.8 pg (ref 28.0–33.4)
MCHC: 34.8 g/dL (ref 32.0–35.9)
MCV: 89 fL (ref 82–98)
MONO#: 0.8 10*3/uL (ref 0.1–0.9)
MONO%: 12.6 % (ref 0.0–13.0)
NEUT%: 50 % (ref 40.0–80.0)
NEUTROS ABS: 3.3 10*3/uL (ref 1.5–6.5)
PLATELETS: 238 10*3/uL (ref 145–400)
RBC: 5.03 10*6/uL (ref 4.20–5.70)
RDW: 13.7 % (ref 11.1–15.7)
WBC: 6.7 10*3/uL (ref 4.0–10.0)

## 2013-12-20 NOTE — Progress Notes (Signed)
Hematology and Oncology Follow Up Visit  SALEEM COCCIA [id number] 03-23-1976 38 y.o. 12/20/2013   Principle Diagnosis:   Kaposi's sarcoma  HIV-asymptomatic  Current Therapy:    Observation     Interim History:  Mr.  Perrow is back for followup. We saw him 2 months ago. Since then, he's been doing quite well. He apparently has had some shingles. This was on the left side of his head.  He took Valtrex.  He sees Dr. Linus Salmons who does his HIV monitoring. Currently the HIV is under very good control. He really has not noted much in way of change with his Kaposi's lesions. We have not treated him for over 6 months.  He's had no shortness of breath. He's had no fever. He's had no cough. His appetite is good with no nausea vomiting.  Had no change in bowel or bladder habits. He did have a time where he had a rectal abscess that following that surgery to take care of it. And since then, he has had no problems.  He's had no weight loss. He's had no mouth sores. He's had no visual problem.   Medications: Current outpatient prescriptions:acyclovir (ZOVIRAX) 800 MG tablet, Take 800 mg by mouth 5 (five) times daily., Disp: , Rfl: ;  citalopram (CELEXA) 10 MG tablet, TAKE 1 TABLET BY MOUTH DAILY., Disp: 30 tablet, Rfl: 5;  COCONUT OIL PO, Take 1,000 mg by mouth 2 (two) times daily., Disp: , Rfl: ;  Darunavir Ethanolate (PREZISTA) 800 MG tablet, TAKE 1 TABLET BY MOUTH EVERY DAY WITH BREAKFAST, Disp: 30 tablet, Rfl: 5 emtricitabine-tenofovir (TRUVADA) 200-300 MG per tablet, TAKE 1 TABLET BY MOUTH EVERY DAY, Disp: 30 tablet, Rfl: 5;  LORazepam (ATIVAN) 0.5 MG tablet, Take 1 tablet (0.5 mg total) by mouth every 6 (six) hours as needed (Nausea or vomiting)., Disp: 30 tablet, Rfl: 2 ondansetron (ZOFRAN) 8 MG tablet, Take 1 tablet (8 mg total) by mouth 2 (two) times daily. Take two times a day starting the day after chemo for 2 days. Then take two times a day as needed for nausea or vomiting., Disp: 30 tablet,  Rfl: 1;  prochlorperazine (COMPAZINE) 10 MG tablet, Take 1 tablet (10 mg total) by mouth every 6 (six) hours as needed (Nausea or vomiting)., Disp: 30 tablet, Rfl: 1 raltegravir (ISENTRESS) 400 MG tablet, TAKE 1 TABLET BY MOUTH TWICE DAILY, Disp: 60 tablet, Rfl: 5;  ritonavir (NORVIR) 100 MG TABS tablet, TAKE 1 TABLET BY MOUTH DAILY AFTER BREAKFAST, Disp: 30 tablet, Rfl: 5;  traZODone (DESYREL) 100 MG tablet, TAKE 1 TABLET BY MOUTH EVERY NIGHT AT BEDTIME, Disp: 30 tablet, Rfl: 0 triamterene-hydrochlorothiazide (MAXZIDE-25) 37.5-25 MG per tablet, Take 1 tablet daily if needed for leg swelling, Disp: 30 tablet, Rfl: 6  Allergies:  Allergies  Allergen Reactions  . Aspirin   . Sulfa Antibiotics Rash    Extreme itching     Past Medical History, Surgical history, Social history, and Family History were reviewed and updated.  Review of Systems: As above  Physical Exam:  height is 5\' 7"  (1.702 m) and weight is 144 lb (65.318 kg). His oral temperature is 98.1 F (36.7 C). His blood pressure is 119/64 and his pulse is 77. His respiration is 16.   Well-developed and well-nourished Afro-American male. Head and neck exam shows no ocular or oral lesions. He has no palpable cervical or supraclavicular lymph nodes. Lungs are clear. Cardiac exam regular rate and rhythm with no murmurs rubs or bruits. Abdomen is soft.  Has good bowel sounds. There is no fluid wave. There is no palpable liver or spleen tip. Neck exam shows no tenderness over the spine ribs or hips. Extremities shows some 1+ nonpitting edema in his legs. His good range motion of his joints. Has good strength in extremities. Skin exam does show the Kaposi's lesions. They are mostly macular in nature. They are somewhat hyperpigmented but do not appear to be growing or any new lesions are noted. Neurological exam shows no focal deficits. I see no nice diagnosed.  Lab Results  Component Value Date   WBC 6.7 12/20/2013   HGB 15.5 12/20/2013   HCT 44.6  12/20/2013   MCV 89 12/20/2013   PLT 238 12/20/2013     Chemistry      Component Value Date/Time   NA 140 11/01/2013 1112   NA 142 08/30/2013 1141   K 3.8 11/01/2013 1112   K 3.8 08/30/2013 1141   CL 105 11/01/2013 1112   CL 103 08/30/2013 1141   CO2 30 11/01/2013 1112   CO2 31 08/30/2013 1141   BUN 7 11/01/2013 1112   BUN 7 08/30/2013 1141   CREATININE 1.18 11/01/2013 1112   CREATININE 0.8 08/30/2013 1141      Component Value Date/Time   CALCIUM 9.5 11/01/2013 1112   CALCIUM 9.0 08/30/2013 1141   ALKPHOS 88 11/01/2013 1112   ALKPHOS 81 08/30/2013 1141   AST 13 11/01/2013 1112   AST 19 08/30/2013 1141   ALT 9 11/01/2013 1112   ALT 15 08/30/2013 1141   BILITOT 0.3 11/01/2013 1112   BILITOT 0.60 08/30/2013 1141         Impression and Plan: Mr. Vangieson is 38 year old African gentleman. He has history of Kaposi's. We treated him with Doxil. He did well with Doxil. Doxil did work quite well.  He is stable. He is asymptomatic. I don't think we have to initiate any therapy on him.  We will plan to get him back in another 3 months.  If he does begin to show progression of the Kaposi's, we can certainly retreat him.   [name], MD 8/5/201510:34 AM

## 2013-12-21 LAB — COMPREHENSIVE METABOLIC PANEL
ALBUMIN: 4.2 g/dL (ref 3.5–5.2)
ALK PHOS: 86 U/L (ref 39–117)
ALT: 13 U/L (ref 0–53)
AST: 16 U/L (ref 0–37)
BUN: 11 mg/dL (ref 6–23)
CALCIUM: 9.4 mg/dL (ref 8.4–10.5)
CHLORIDE: 106 meq/L (ref 96–112)
CO2: 26 mEq/L (ref 19–32)
Creatinine, Ser: 1.09 mg/dL (ref 0.50–1.35)
Glucose, Bld: 89 mg/dL (ref 70–99)
POTASSIUM: 4.1 meq/L (ref 3.5–5.3)
Sodium: 140 mEq/L (ref 135–145)
Total Bilirubin: 0.4 mg/dL (ref 0.2–1.2)
Total Protein: 6.7 g/dL (ref 6.0–8.3)

## 2013-12-21 LAB — VITAMIN D 25 HYDROXY (VIT D DEFICIENCY, FRACTURES): VIT D 25 HYDROXY: 33 ng/mL (ref 30–89)

## 2013-12-21 LAB — LACTATE DEHYDROGENASE: LDH: 160 U/L (ref 94–250)

## 2013-12-22 ENCOUNTER — Encounter: Payer: Self-pay | Admitting: *Deleted

## 2013-12-26 ENCOUNTER — Telehealth: Payer: Self-pay | Admitting: *Deleted

## 2013-12-26 NOTE — Telephone Encounter (Addendum)
Message copied by Lenn Sink on Tue Dec 26, 2013  1:30 PM ------      Message from: Burney Gauze R      Created: Thu Dec 21, 2013 10:23 PM       Call - vit D is low.  How much he is takig???  pete ------Informed pt that vit D was low. Pt stated he is not taking Vit D. Informed pt that a nurse would let Dr Marin Olp know and we'd get back to the pt about how much vit d he should take.

## 2013-12-27 ENCOUNTER — Telehealth: Payer: Self-pay | Admitting: *Deleted

## 2013-12-27 NOTE — Telephone Encounter (Addendum)
Message copied by Lenn Sink on Wed Dec 27, 2013  9:24 AM ------      Message from: Burney Gauze R      Created: Thu Dec 21, 2013 10:23 PM       Call - vit D is low.  How much he is takig???  pete ------Left voicemail informing pt to take 2,000 units of vitamin D a day. Informed pt to pick this up over the counter. Informed pt to call the office back and inform us that he got this voicemail.

## 2014-01-04 ENCOUNTER — Ambulatory Visit: Payer: Self-pay | Admitting: Hematology & Oncology

## 2014-01-04 ENCOUNTER — Other Ambulatory Visit: Payer: Self-pay | Admitting: Lab

## 2014-01-11 IMAGING — CT CT CHEST W/ CM
2 of 3 series · 15 of 46 positions shown, 17 images · IV contrast (APPLIED)
Comparison: CT abdomen pelvis 08/11/2011.

CT CHEST

CLINICAL DATA: HIV positive.  Kaposi sarcoma.

CT CHEST, ABDOMEN AND PELVIS WITH CONTRAST
TECHNIQUE: Multidetector CT imaging of the chest, abdomen and
pelvis was performed following the standard protocol during bolus
administration of intravenous contrast.
Contrast: 100mL OMNIPAQUE IOHEXOL 300 MG/ML  SOLN,

[Series 2: chest/abd/pel 5.0 b31f · axial · 0.75mm/px · z∈[+834,+1374]mm · 12 of 125 slices shown, 14 images]
[im 9/125  soft-tissue]
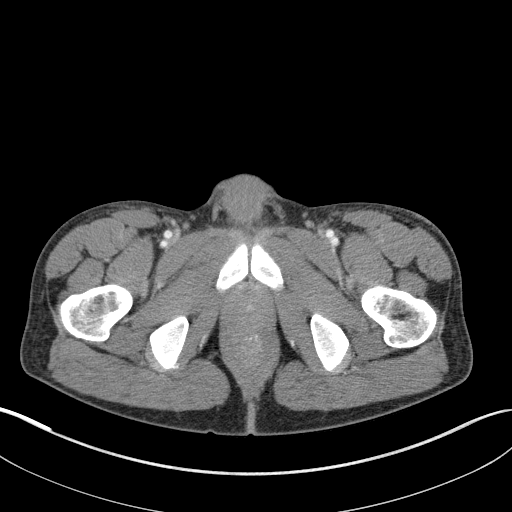
[im 9/125  bone]
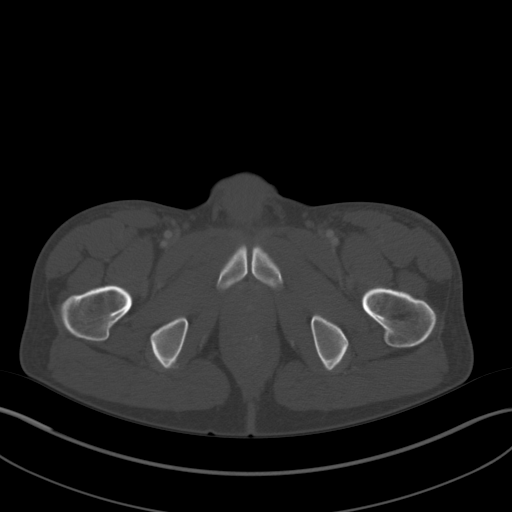
[im 17/125  soft-tissue]
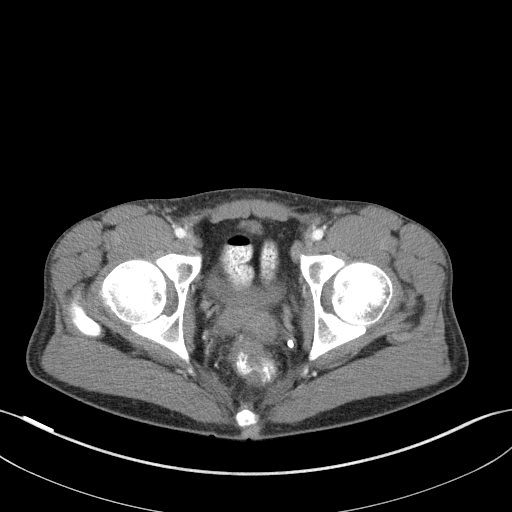
[im 29/125  soft-tissue]
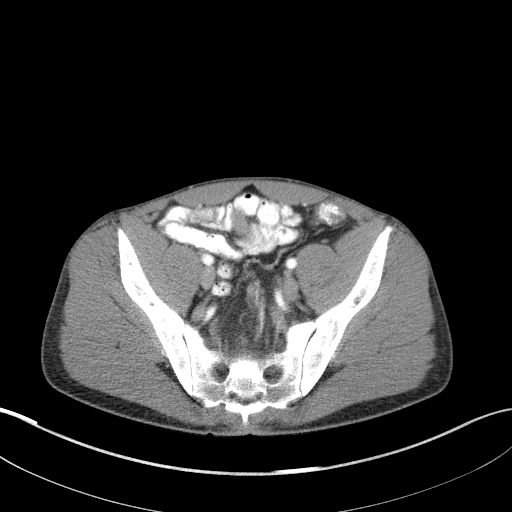
[im 37/125  soft-tissue]
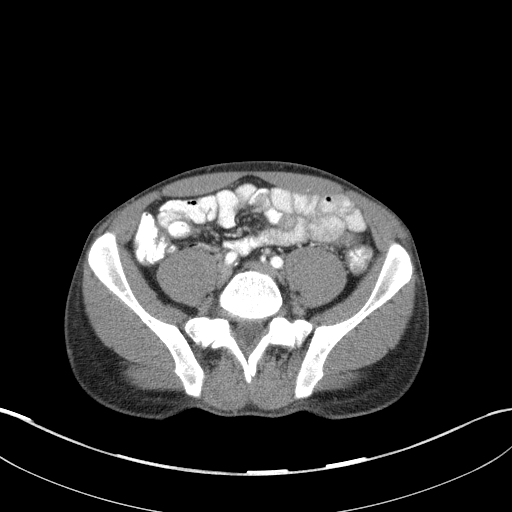
[im 49/125  soft-tissue]
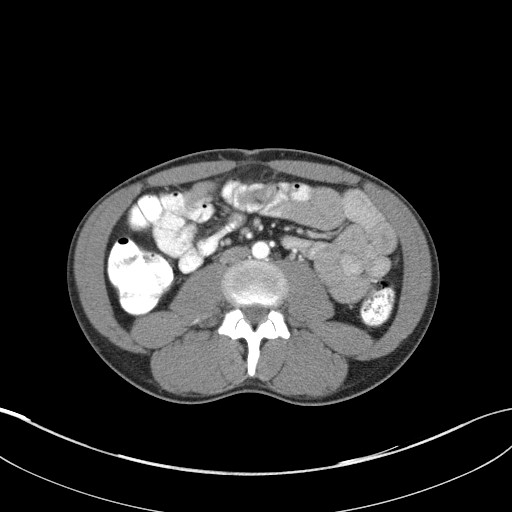
[im 57/125  soft-tissue]
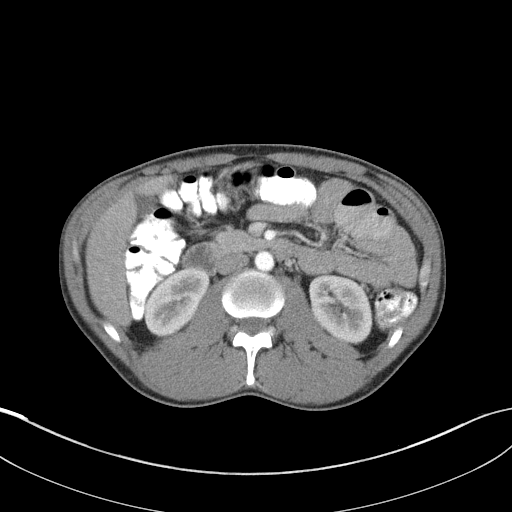
[im 69/125  soft-tissue]
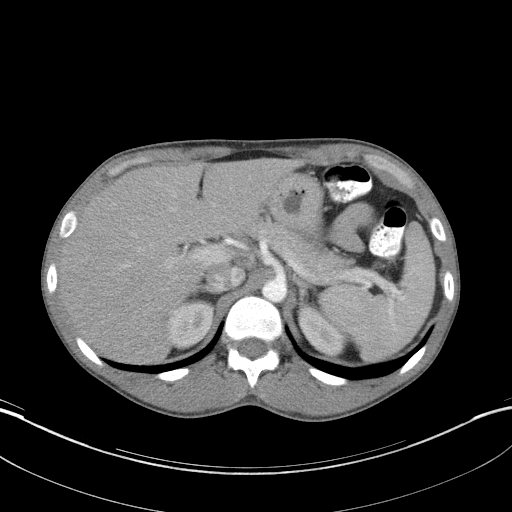
[im 77/125  soft-tissue]
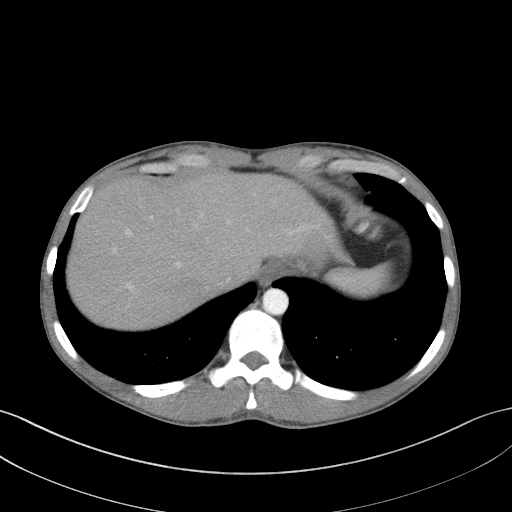
[im 89/125  soft-tissue]
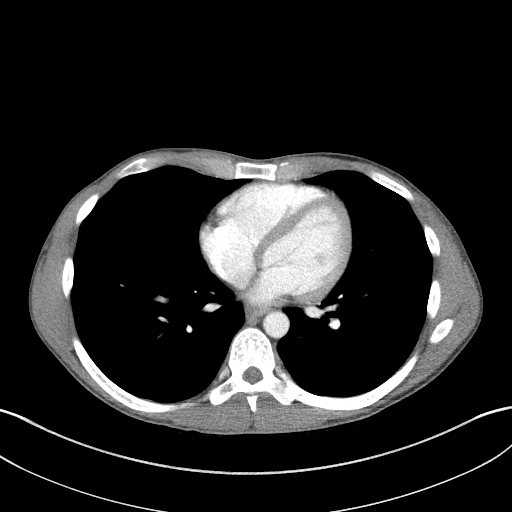
[im 89/125  bone]
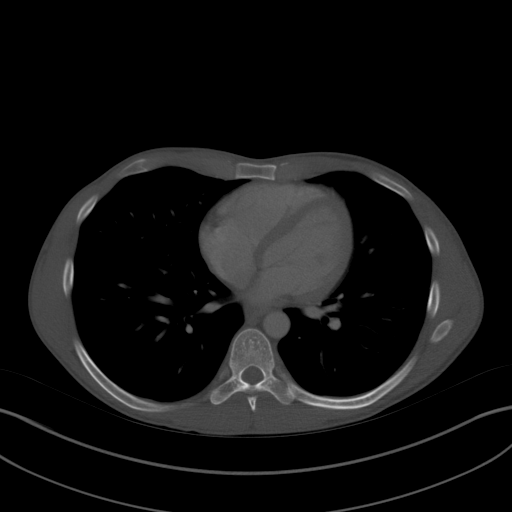
[im 97/125  soft-tissue]
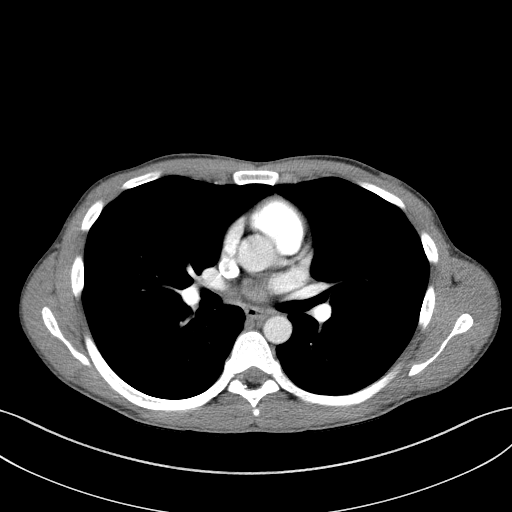
[im 109/125  soft-tissue]
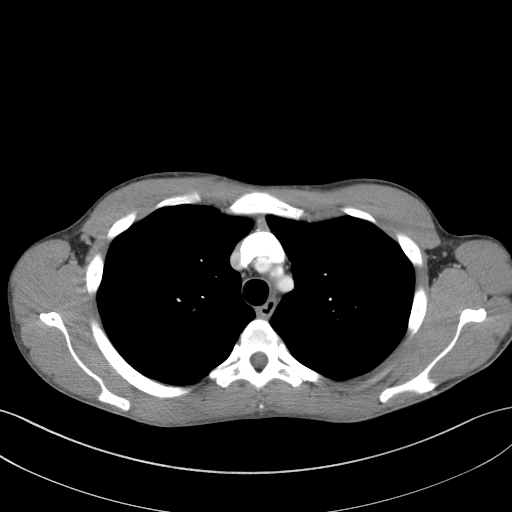
[im 117/125  soft-tissue]
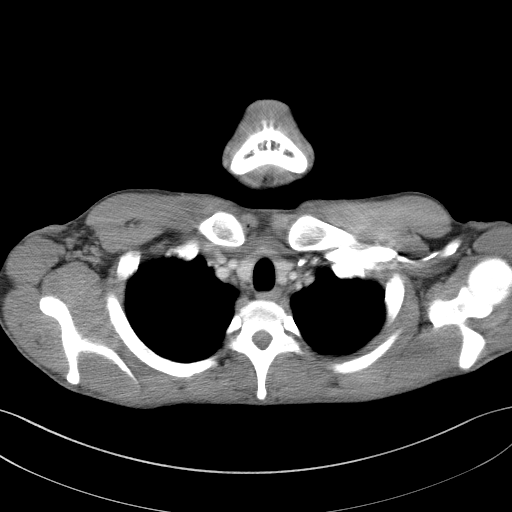

[Series 5: chest/abd/pel 3.0 coronal · coronal · 0.77mm/px · 3 of 75 slices shown]
[im 25/75  soft-tissue]
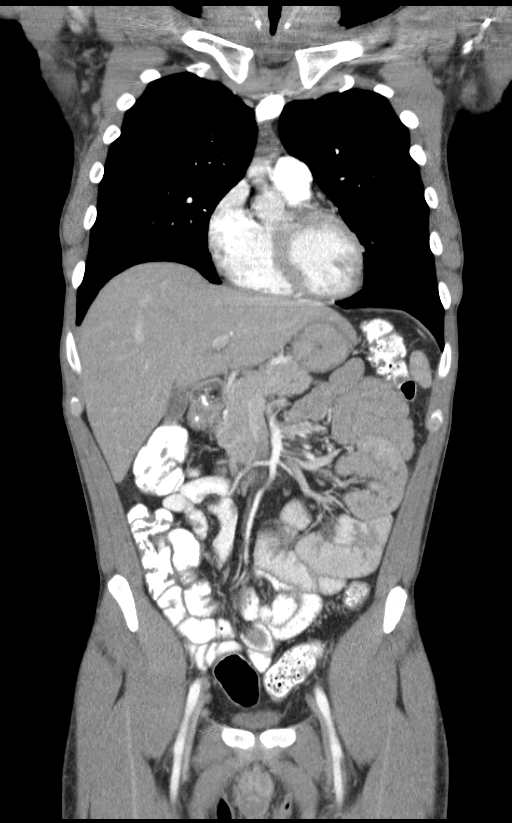
[im 33/75  soft-tissue]
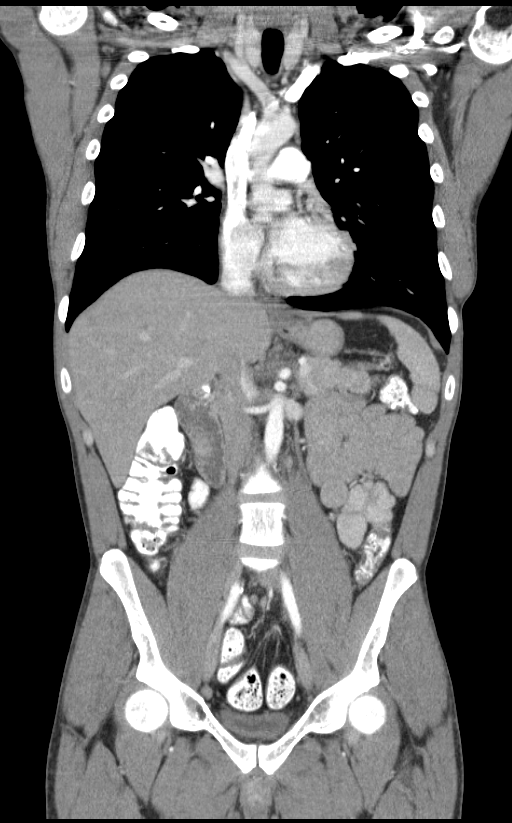
[im 42/75  soft-tissue]
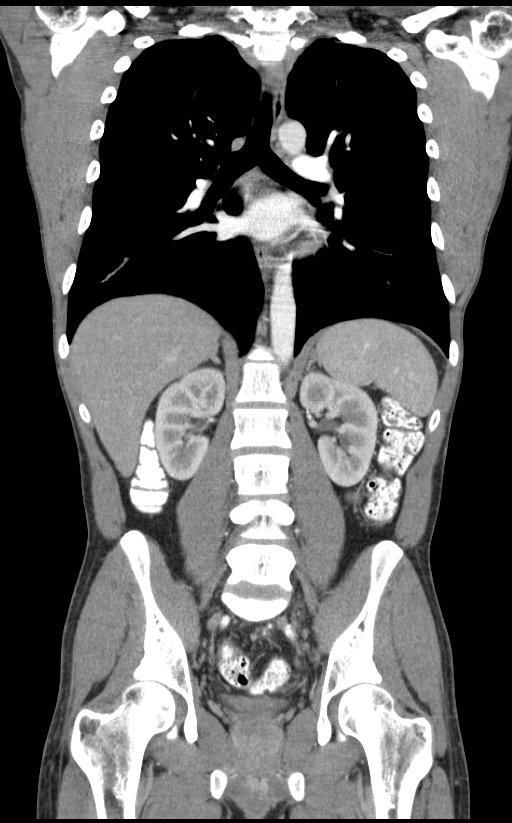

[15 of 46 positions shown; findings below may reference images not displayed]

FINDINGS: Triangular-shaped soft tissue in the prevascular space
presumably represents thymus.  There are small mediastinal lymph
nodes.  No pathologically enlarged mediastinal, hilar or axillary
lymph nodes.  Heart size normal.  No pericardial effusion.

Lungs are clear.  No pleural fluid.  Airway is unremarkable.
IMPRESSION: No acute findings.  No pulmonary manifestations of Kaposi sarcoma.

CT ABDOMEN AND PELVIS
FINDINGS: Pre pericardiac/juxta diaphragmatic lymph nodes measure
up to 8 mm in short axis, similar to the prior exam.  Liver,
gallbladder, adrenal glands, kidneys, spleen, pancreas, stomach and
small bowel are unremarkable.  Distal sigmoid colon and rectal
walls appear somewhat thickened but this area is under distended
remainder of the colon is unremarkable.

Previously seen presacral soft tissue thickening and perirectal
abscess have resolved in the interval.  Scattered lymph nodes are
not enlarged by CT size criteria.  Scattered atherosclerotic
calcification of the arterial vasculature without abdominal aortic
aneurysm.  No worrisome lytic or sclerotic lesions.
IMPRESSION: Possible mild thickening of the distal sigmoid colon and rectal
walls versus under distention.

## 2014-01-15 ENCOUNTER — Other Ambulatory Visit: Payer: Self-pay | Admitting: Hematology & Oncology

## 2014-02-14 ENCOUNTER — Other Ambulatory Visit: Payer: Self-pay | Admitting: Hematology & Oncology

## 2014-02-14 DIAGNOSIS — C469 Kaposi's sarcoma, unspecified: Secondary | ICD-10-CM

## 2014-02-15 ENCOUNTER — Other Ambulatory Visit: Payer: Self-pay | Admitting: Hematology & Oncology

## 2014-02-15 ENCOUNTER — Other Ambulatory Visit: Payer: Self-pay | Admitting: Internal Medicine

## 2014-02-19 ENCOUNTER — Ambulatory Visit (HOSPITAL_COMMUNITY)
Admission: RE | Admit: 2014-02-19 | Discharge: 2014-02-19 | Disposition: A | Discharge status: Home / Self Care | Payer: No Typology Code available for payment source | Source: Ambulatory Visit | Attending: Hematology & Oncology | Admitting: Hematology & Oncology

## 2014-02-19 DIAGNOSIS — B20 Human immunodeficiency virus [HIV] disease: Secondary | ICD-10-CM | POA: Insufficient documentation

## 2014-02-19 DIAGNOSIS — Z87891 Personal history of nicotine dependence: Secondary | ICD-10-CM | POA: Insufficient documentation

## 2014-02-19 DIAGNOSIS — C469 Kaposi's sarcoma, unspecified: Secondary | ICD-10-CM | POA: Insufficient documentation

## 2014-02-19 DIAGNOSIS — Z08 Encounter for follow-up examination after completed treatment for malignant neoplasm: Secondary | ICD-10-CM | POA: Insufficient documentation

## 2014-02-19 NOTE — Progress Notes (Signed)
*  PRELIMINARY RESULTS* Echocardiogram TTE has been performed.  Todd Vincent 02/19/2014, 10:55 AM

## 2014-02-22 ENCOUNTER — Other Ambulatory Visit (HOSPITAL_BASED_OUTPATIENT_CLINIC_OR_DEPARTMENT_OTHER): Payer: No Typology Code available for payment source | Admitting: Lab

## 2014-02-22 ENCOUNTER — Ambulatory Visit (HOSPITAL_BASED_OUTPATIENT_CLINIC_OR_DEPARTMENT_OTHER): Payer: No Typology Code available for payment source | Admitting: Hematology & Oncology

## 2014-02-22 ENCOUNTER — Other Ambulatory Visit: Payer: Self-pay | Admitting: *Deleted

## 2014-02-22 ENCOUNTER — Ambulatory Visit (HOSPITAL_BASED_OUTPATIENT_CLINIC_OR_DEPARTMENT_OTHER): Payer: No Typology Code available for payment source

## 2014-02-22 ENCOUNTER — Encounter: Payer: Self-pay | Admitting: Hematology & Oncology

## 2014-02-22 VITALS — BP 97/70 | HR 102 | Temp 97.7°F | Resp 18 | Ht 67.0 in | Wt 147.0 lb

## 2014-02-22 DIAGNOSIS — Z5111 Encounter for antineoplastic chemotherapy: Secondary | ICD-10-CM

## 2014-02-22 DIAGNOSIS — C469 Kaposi's sarcoma, unspecified: Secondary | ICD-10-CM

## 2014-02-22 DIAGNOSIS — B20 Human immunodeficiency virus [HIV] disease: Secondary | ICD-10-CM

## 2014-02-22 DIAGNOSIS — D469 Myelodysplastic syndrome, unspecified: Secondary | ICD-10-CM

## 2014-02-22 DIAGNOSIS — Z23 Encounter for immunization: Secondary | ICD-10-CM

## 2014-02-22 LAB — CBC WITH DIFFERENTIAL (CANCER CENTER ONLY)
BASO#: 0 10*3/uL (ref 0.0–0.2)
BASO%: 0.5 % (ref 0.0–2.0)
EOS%: 3.3 % (ref 0.0–7.0)
Eosinophils Absolute: 0.3 10*3/uL (ref 0.0–0.5)
HEMATOCRIT: 43.8 % (ref 38.7–49.9)
HEMOGLOBIN: 15.2 g/dL (ref 13.0–17.1)
LYMPH#: 2.9 10*3/uL (ref 0.9–3.3)
LYMPH%: 38.3 % (ref 14.0–48.0)
MCH: 31.1 pg (ref 28.0–33.4)
MCHC: 34.7 g/dL (ref 32.0–35.9)
MCV: 90 fL (ref 82–98)
MONO#: 0.6 10*3/uL (ref 0.1–0.9)
MONO%: 8 % (ref 0.0–13.0)
NEUT#: 3.7 10*3/uL (ref 1.5–6.5)
NEUT%: 49.9 % (ref 40.0–80.0)
Platelets: 262 10*3/uL (ref 145–400)
RBC: 4.89 10*6/uL (ref 4.20–5.70)
RDW: 13.2 % (ref 11.1–15.7)
WBC: 7.5 10*3/uL (ref 4.0–10.0)

## 2014-02-22 LAB — CMP (CANCER CENTER ONLY)
ALT: 14 U/L (ref 10–47)
AST: 14 U/L (ref 11–38)
Albumin: 3.7 g/dL (ref 3.3–5.5)
Alkaline Phosphatase: 77 U/L (ref 26–84)
BUN: 9 mg/dL (ref 7–22)
CALCIUM: 9.4 mg/dL (ref 8.0–10.3)
CHLORIDE: 103 meq/L (ref 98–108)
CO2: 25 meq/L (ref 18–33)
CREATININE: 0.9 mg/dL (ref 0.6–1.2)
GLUCOSE: 97 mg/dL (ref 73–118)
Potassium: 3.3 mEq/L (ref 3.3–4.7)
Sodium: 139 mEq/L (ref 128–145)
Total Bilirubin: 0.6 mg/dl (ref 0.20–1.60)
Total Protein: 6.7 g/dL (ref 6.4–8.1)

## 2014-02-22 MED ORDER — INFLUENZA VAC SPLIT QUAD 0.5 ML IM SUSY
0.5000 mL | PREFILLED_SYRINGE | Freq: Once | INTRAMUSCULAR | Status: AC
Start: 2014-02-22 — End: 2014-02-22
  Administered 2014-02-22: 0.5 mL via INTRAMUSCULAR
  Filled 2014-02-22: qty 0.5

## 2014-02-22 MED ORDER — DEXAMETHASONE SODIUM PHOSPHATE 10 MG/ML IJ SOLN
INTRAMUSCULAR | Status: AC
  Filled 2014-02-22: qty 1

## 2014-02-22 MED ORDER — DEXAMETHASONE SODIUM PHOSPHATE 10 MG/ML IJ SOLN
10.0000 mg | Freq: Once | INTRAMUSCULAR | Status: AC
  Administered 2014-02-22: 10 mg via INTRAVENOUS

## 2014-02-22 MED ORDER — ONDANSETRON 8 MG/50ML IVPB (CHCC)
8.0000 mg | Freq: Once | INTRAVENOUS | Status: AC
  Administered 2014-02-22: 8 mg via INTRAVENOUS

## 2014-02-22 MED ORDER — PROCHLORPERAZINE MALEATE 10 MG PO TABS: 10.0000 mg | ORAL_TABLET | Freq: Four times a day (QID) | ORAL | Status: DC | PRN

## 2014-02-22 MED ORDER — DEXTROSE 5 % IV SOLN
20.0000 mg/m2 | Freq: Once | INTRAVENOUS | Status: AC
  Administered 2014-02-22: 36 mg via INTRAVENOUS
  Filled 2014-02-22: qty 18

## 2014-02-22 MED ORDER — SODIUM CHLORIDE 0.9 % IV SOLN
Freq: Once | INTRAVENOUS | Status: AC
  Administered 2014-02-22: 10:00:00 via INTRAVENOUS

## 2014-02-22 MED ORDER — ONDANSETRON HCL 8 MG PO TABS: 8.0000 mg | ORAL_TABLET | Freq: Two times a day (BID) | ORAL | Status: DC

## 2014-02-22 NOTE — Patient Instructions (Addendum)
[facility] Discharge Instructions for Patients Receiving Chemotherapy  Today you received the following chemotherapy agents Doxil   To help prevent nausea and vomiting after your treatment, we encourage you to take your nausea medication as directed   If you develop nausea and vomiting that is not controlled by your nausea medication, call the clinic.   BELOW ARE SYMPTOMS THAT SHOULD BE REPORTED IMMEDIATELY:  *FEVER GREATER THAN 100.5 F  *CHILLS WITH OR WITHOUT FEVER  NAUSEA AND VOMITING THAT IS NOT CONTROLLED WITH YOUR NAUSEA MEDICATION  *UNUSUAL SHORTNESS OF BREATH  *UNUSUAL BRUISING OR BLEEDING  TENDERNESS IN MOUTH AND THROAT WITH OR WITHOUT PRESENCE OF ULCERS  *URINARY PROBLEMS  *BOWEL PROBLEMS  UNUSUAL RASH Items with * indicate a potential emergency and should be followed up as soon as possible.  Feel free to call the clinic you have any questions or concerns. The clinic phone number is [phone].   Doxorubicin Liposomal injection What is this medicine? LIPOSOMAL DOXORUBICIN (LIP oh som al dox oh ROO bi sin) is a chemotherapy drug. This medicine is used to treat many kinds of cancer like Kaposi's sarcoma, multiple myeloma, and ovarian cancer. This medicine may be used for other purposes; ask your health care provider or pharmacist if you have questions. COMMON BRAND NAME(S): Doxil, Lipodox What should I tell my health care provider before I take this medicine? They need to know if you have any of these conditions: -blood disorders -heart disease -infection (especially a virus infection such as chickenpox, cold sores, or herpes) -liver disease -recent or ongoing radiation therapy -an unusual or allergic reaction to doxorubicin, other chemotherapy agents, soybeans, other medicines, foods, dyes, or preservatives -pregnant or trying to get pregnant -breast-feeding How should I use this medicine? This drug is given as an infusion into a vein. It  is administered in a hospital or clinic by a specially trained health care professional. If you have pain, swelling, burning or any unusual feeling around the site of your injection, tell your health care professional right away. Talk to your pediatrician regarding the use of this medicine in children. Special care may be needed. Overdosage: If you think you have taken too much of this medicine contact a poison control center or emergency room at once. NOTE: This medicine is only for you. Do not share this medicine with others. What if I miss a dose? It is important not to miss your dose. Call your doctor or health care professional if you are unable to keep an appointment. What may interact with this medicine? Do not take this medicine with any of the following medications: -zidovudine This medicine may also interact with the following medications: -medicines to increase blood counts like filgrastim, pegfilgrastim, sargramostim -vaccines Talk to your doctor or health care professional before taking any of these medicines: -acetaminophen -aspirin -ibuprofen -ketoprofen -naproxen This list may not describe all possible interactions. Give your health care provider a list of all the medicines, herbs, non-prescription drugs, or dietary supplements you use. Also tell them if you smoke, drink alcohol, or use illegal drugs. Some items may interact with your medicine. What should I watch for while using this medicine? Your condition will be monitored carefully while you are receiving this medicine. You will need important blood work done while you are taking this medicine. This drug may make you feel generally unwell. This is not uncommon, as chemotherapy can affect healthy cells as well as cancer cells. Report any side effects. Continue your course  of treatment even though you feel ill unless your doctor tells you to stop. Your urine may turn orange-red for a few days after your dose. This is not  blood. If your urine is dark or brown, call your doctor. In some cases, you may be given additional medicines to help with side effects. Follow all directions for their use. Call your doctor or health care professional for advice if you get a fever (100.5 degrees F or higher), chills or sore throat, or other symptoms of a cold or flu. Do not treat yourself. This drug decreases your body's ability to fight infections. Try to avoid being around people who are sick. This medicine may increase your risk to bruise or bleed. Call your doctor or health care professional if you notice any unusual bleeding. Be careful brushing and flossing your teeth or using a toothpick because you may get an infection or bleed more easily. If you have any dental work done, tell your dentist you are receiving this medicine. Avoid taking products that contain aspirin, acetaminophen, ibuprofen, naproxen, or ketoprofen unless instructed by your doctor. These medicines may hide a fever. Men and women of childbearing age should use effective birth control methods while using taking this medicine. Do not become pregnant while taking this medicine. There is a potential for serious side effects to an unborn child. Talk to your health care professional or pharmacist for more information. Do not breast-feed an infant while taking this medicine. Talk to your doctor about your risk of cancer. You may be more at risk for certain types of cancers if you take this medicine. What side effects may I notice from receiving this medicine? Side effects that you should report to your doctor or health care professional as soon as possible: -allergic reactions like skin rash, itching or hives, swelling of the face, lips, or tongue -low blood counts - this medicine may decrease the number of white blood cells, red blood cells and platelets. You may be at increased risk for infections and bleeding. -signs of hand-foot syndrome - tingling or burning,  redness, flaking, swelling, small blisters, or small sores on the palms of your hands or the soles of your feet -signs of infection - fever or chills, cough, sore throat, pain or difficulty passing urine -signs of decreased platelets or bleeding - bruising, pinpoint red spots on the skin, black, tarry stools, blood in the urine -signs of decreased red blood cells - unusually weak or tired, fainting spells, lightheadedness -back pain, chills, facial flushing, fever, headache, tightness in the chest or throat during the infusion -breathing problems -chest pain -fast, irregular heartbeat -mouth pain, redness, sores -pain, swelling, redness at site where injected -pain, tingling, numbness in the hands or feet -swelling of ankles, feet, or hands -vomiting Side effects that usually do not require medical attention (report to your doctor or health care professional if they continue or are bothersome): -diarrhea -hair loss -loss of appetite -nail discoloration or damage -nausea -red or watery eyes -red colored urine -stomach upset This list may not describe all possible side effects. Call your doctor for medical advice about side effects. You may report side effects to FDA at 1-800-FDA-1088. Where should I keep my medicine? This drug is given in a hospital or clinic and will not be stored at home. NOTE: This sheet is a summary. It may not cover all possible information. If you have questions about this medicine, talk to your doctor, pharmacist, or health care provider.  2015, [employer]  Standard. (2012-01-22 10:12:56) Influenza Virus Vaccine injection (Fluarix) What is this medicine? INFLUENZA VIRUS VACCINE (in floo EN zuh VAHY ruhs vak SEEN) helps to reduce the risk of getting influenza also known as the flu. This medicine may be used for other purposes; ask your health care provider or pharmacist if you have questions. COMMON BRAND NAME(S): Fluarix, Fluzone What should I tell my health  care provider before I take this medicine? They need to know if you have any of these conditions: -bleeding disorder like hemophilia -fever or infection -Guillain-Barre syndrome or other neurological problems -immune system problems -infection with the human immunodeficiency virus (HIV) or AIDS -low blood platelet counts -multiple sclerosis -an unusual or allergic reaction to influenza virus vaccine, eggs, chicken proteins, latex, gentamicin, other medicines, foods, dyes or preservatives -pregnant or trying to get pregnant -breast-feeding How should I use this medicine? This vaccine is for injection into a muscle. It is given by a health care professional. A copy of Vaccine Information Statements will be given before each vaccination. Read this sheet carefully each time. The sheet may change frequently. Talk to your pediatrician regarding the use of this medicine in children. Special care may be needed. Overdosage: If you think you have taken too much of this medicine contact a poison control center or emergency room at once. NOTE: This medicine is only for you. Do not share this medicine with others. What if I miss a dose? This does not apply. What may interact with this medicine? -chemotherapy or radiation therapy -medicines that lower your immune system like etanercept, anakinra, infliximab, and adalimumab -medicines that treat or prevent blood clots like warfarin -phenytoin -steroid medicines like prednisone or cortisone -theophylline -vaccines This list may not describe all possible interactions. Give your health care provider a list of all the medicines, herbs, non-prescription drugs, or dietary supplements you use. Also tell them if you smoke, drink alcohol, or use illegal drugs. Some items may interact with your medicine. What should I watch for while using this medicine? Report any side effects that do not go away within 3 days to your doctor or health care professional. Call  your health care provider if any unusual symptoms occur within 6 weeks of receiving this vaccine. You may still catch the flu, but the illness is not usually as bad. You cannot get the flu from the vaccine. The vaccine will not protect against colds or other illnesses that may cause fever. The vaccine is needed every year. What side effects may I notice from receiving this medicine? Side effects that you should report to your doctor or health care professional as soon as possible: -allergic reactions like skin rash, itching or hives, swelling of the face, lips, or tongue Side effects that usually do not require medical attention (report to your doctor or health care professional if they continue or are bothersome): -fever -headache -muscle aches and pains -pain, tenderness, redness, or swelling at site where injected -weak or tired This list may not describe all possible side effects. Call your doctor for medical advice about side effects. You may report side effects to FDA at 1-800-FDA-1088. Where should I keep my medicine? This vaccine is only given in a clinic, pharmacy, doctor's office, or other health care setting and will not be stored at home. NOTE: This sheet is a summary. It may not cover all possible information. If you have questions about this medicine, talk to your doctor, pharmacist, or health care provider.  2015, [employer]/Gold Standard. (2007-11-30 09:30:40)

## 2014-02-22 NOTE — Progress Notes (Signed)
Hematology and Oncology Follow Up Visit  Todd Vincent [id number] [birth year] 38 y.o. 02/22/2014   Principle Diagnosis:   Kaposi's sarcoma - progressive  HIV-asymptomatic  Current Therapy:    Doxil-to two-week dosing     Interim History:  Mr.  Lady is back for followup. We saw him 2 months ago. Since then, he's noticed some of the Kaposi's lesions are becoming more prominent. They become elevated "thicker". As such, he feels that he should get back on the therapy. We have not treated him now for over a year. He did well with Doxil initially.  We did go ahead and do a echocardiogram on him. This was done earlier this week. His ejection fraction was 50-55%. He sees Dr. Linus Salmons who does his HIV monitoring. Currently the HIV is under very good control.  He's had no shortness of breath. He's had no fever. He's had no cough. His appetite is good with no nausea vomiting.  Had no change in bowel or bladder habits. He did have a time where he had a rectal abscess that following that surgery to take care of it. And since then, he has had no problems.  He's had no weight loss. He's had no mouth sores. He's had no visual problem.   Medications: Current outpatient prescriptions:acyclovir (ZOVIRAX) 800 MG tablet, Take 800 mg by mouth 5 (five) times daily., Disp: , Rfl: ;  Cholecalciferol (VITAMIN D3) 2000 UNITS TABS, Take 2,000 Units by mouth every morning. 2 tabs = 4,000 U daily, Disp: , Rfl: ;  citalopram (CELEXA) 10 MG tablet, TAKE 1 TABLET BY MOUTH DAILY, Disp: 30 tablet, Rfl: 0;  COCONUT OIL PO, Take 1,000 mg by mouth 2 (two) times daily., Disp: , Rfl:  Darunavir Ethanolate (PREZISTA) 800 MG tablet, TAKE 1 TABLET BY MOUTH EVERY DAY WITH BREAKFAST, Disp: 30 tablet, Rfl: 5;  emtricitabine-tenofovir (TRUVADA) 200-300 MG per tablet, TAKE 1 TABLET BY MOUTH EVERY DAY, Disp: 30 tablet, Rfl: 5;  LORazepam (ATIVAN) 0.5 MG tablet, Take 1 tablet (0.5 mg total) by mouth every 6 (six) hours as needed (Nausea  or vomiting)., Disp: 30 tablet, Rfl: 2 raltegravir (ISENTRESS) 400 MG tablet, TAKE 1 TABLET BY MOUTH TWICE DAILY, Disp: 60 tablet, Rfl: 5;  ritonavir (NORVIR) 100 MG TABS tablet, TAKE 1 TABLET BY MOUTH DAILY AFTER BREAKFAST, Disp: 30 tablet, Rfl: 5;  traZODone (DESYREL) 100 MG tablet, TAKE 1 TABLET BY MOUTH EVERY NIGHT AT BEDTIME, Disp: 30 tablet, Rfl: 0 triamterene-hydrochlorothiazide (MAXZIDE-25) 37.5-25 MG per tablet, Take 1 tablet daily if needed for leg swelling, Disp: 30 tablet, Rfl: 6;  ondansetron (ZOFRAN) 8 MG tablet, Take 1 tablet (8 mg total) by mouth 2 (two) times daily. Start the day after chemo for 2 days. Then take as needed for nausea or vomiting., Disp: 20 tablet, Rfl: 0 prochlorperazine (COMPAZINE) 10 MG tablet, Take 1 tablet (10 mg total) by mouth every 6 (six) hours as needed for nausea or vomiting., Disp: 30 tablet, Rfl: 0  Allergies:  Allergies  Allergen Reactions  . Aspirin   . Sulfa Antibiotics Rash    Extreme itching     Past Medical History, Surgical history, Social history, and Family History were reviewed and updated.  Review of Systems: As above  Physical Exam:  height is 5\' 7"  (1.702 m) and weight is 147 lb (66.679 kg). His oral temperature is 97.7 F (36.5 C). His blood pressure is 97/70 and his pulse is 102. His respiration is 18.   Well-developed and well-nourished Afro-American male.  Head and neck exam shows no ocular or oral lesions. He has no palpable cervical or supraclavicular lymph nodes. Lungs are clear. Cardiac exam regular rate and rhythm with no murmurs rubs or bruits. Abdomen is soft. Has good bowel sounds. There is no fluid wave. There is no palpable liver or spleen tip. Neck exam shows no tenderness over the spine ribs or hips. Extremities shows some 1+ nonpitting edema in his legs. His good range motion of his joints. Has good strength in extremities. Skin exam does show the Kaposi's lesions. They are mostly macular in nature. They are somewhat  hyperpigmented but do not appear to be growing or any new lesions are noted. Neurological exam shows no focal deficits. I see no nice diagnosed.  Lab Results  Component Value Date   WBC 7.5 02/22/2014   HGB 15.2 02/22/2014   HCT 43.8 02/22/2014   MCV 90 02/22/2014   PLT 262 02/22/2014     Chemistry      Component Value Date/Time   NA 139 02/22/2014 0815   NA 140 12/20/2013 0759   K 3.3 02/22/2014 0815   K 4.1 12/20/2013 0759   CL 103 02/22/2014 0815   CL 106 12/20/2013 0759   CO2 25 02/22/2014 0815   CO2 26 12/20/2013 0759   BUN 9 02/22/2014 0815   BUN 11 12/20/2013 0759   CREATININE 0.9 02/22/2014 0815   CREATININE 1.09 12/20/2013 0759      Component Value Date/Time   CALCIUM 9.4 02/22/2014 0815   CALCIUM 9.4 12/20/2013 0759   ALKPHOS 77 02/22/2014 0815   ALKPHOS 86 12/20/2013 0759   AST 14 02/22/2014 0815   AST 16 12/20/2013 0759   ALT 14 02/22/2014 0815   ALT 13 12/20/2013 0759   BILITOT 0.60 02/22/2014 0815   BILITOT 0.4 12/20/2013 0759         Impression and Plan: Mr. Deakin is 38 year old African gentleman. He has history of Kaposi's. We treated him with Doxil. He did well with Doxil. Doxil did work quite well. As such, I think Doxil work again.  We will go ahead and start him today. It will be easy for [place] to tell if he responds.  His cardiac function is still quite good.  We'll plan to get back to see [place] in another 3 weeks.   [name], MD 10/8/201512:34 PM

## 2014-02-23 LAB — HIV-1 RNA QUANT-NO REFLEX-BLD
HIV 1 RNA Quant: 20 copies/mL (ref <20)
HIV-1 RNA Quant, Log: 1.3 {Log} (ref <1.30)

## 2014-02-23 LAB — LACTATE DEHYDROGENASE: LDH: 131 U/L (ref 94–250)

## 2014-03-14 ENCOUNTER — Other Ambulatory Visit: Payer: Self-pay | Admitting: Internal Medicine

## 2014-03-14 ENCOUNTER — Other Ambulatory Visit: Payer: Self-pay | Admitting: Hematology & Oncology

## 2014-03-15 ENCOUNTER — Ambulatory Visit (HOSPITAL_BASED_OUTPATIENT_CLINIC_OR_DEPARTMENT_OTHER): Payer: No Typology Code available for payment source | Admitting: Family

## 2014-03-15 ENCOUNTER — Ambulatory Visit (HOSPITAL_BASED_OUTPATIENT_CLINIC_OR_DEPARTMENT_OTHER): Payer: No Typology Code available for payment source

## 2014-03-15 ENCOUNTER — Other Ambulatory Visit (HOSPITAL_BASED_OUTPATIENT_CLINIC_OR_DEPARTMENT_OTHER): Payer: No Typology Code available for payment source | Admitting: Lab

## 2014-03-15 ENCOUNTER — Encounter: Payer: Self-pay | Admitting: Family

## 2014-03-15 VITALS — BP 106/66 | HR 77 | Temp 98.0°F | Resp 18 | Ht 67.0 in | Wt 147.0 lb

## 2014-03-15 DIAGNOSIS — C469 Kaposi's sarcoma, unspecified: Secondary | ICD-10-CM

## 2014-03-15 DIAGNOSIS — Z21 Asymptomatic human immunodeficiency virus [HIV] infection status: Secondary | ICD-10-CM

## 2014-03-15 DIAGNOSIS — B20 Human immunodeficiency virus [HIV] disease: Secondary | ICD-10-CM

## 2014-03-15 LAB — CBC WITH DIFFERENTIAL (CANCER CENTER ONLY)
BASO#: 0 10*3/uL (ref 0.0–0.2)
BASO%: 0.5 % (ref 0.0–2.0)
EOS%: 2.7 % (ref 0.0–7.0)
Eosinophils Absolute: 0.2 10*3/uL (ref 0.0–0.5)
HEMATOCRIT: 42.3 % (ref 38.7–49.9)
HGB: 14.8 g/dL (ref 13.0–17.1)
LYMPH#: 2.6 10*3/uL (ref 0.9–3.3)
LYMPH%: 33.2 % (ref 14.0–48.0)
MCH: 31.6 pg (ref 28.0–33.4)
MCHC: 35 g/dL (ref 32.0–35.9)
MCV: 90 fL (ref 82–98)
MONO#: 0.6 10*3/uL (ref 0.1–0.9)
MONO%: 7.4 % (ref 0.0–13.0)
NEUT#: 4.3 10*3/uL (ref 1.5–6.5)
NEUT%: 56.2 % (ref 40.0–80.0)
Platelets: 276 10*3/uL (ref 145–400)
RBC: 4.69 10*6/uL (ref 4.20–5.70)
RDW: 13.1 % (ref 11.1–15.7)
WBC: 7.7 10*3/uL (ref 4.0–10.0)

## 2014-03-15 LAB — CMP (CANCER CENTER ONLY)
ALBUMIN: 3.4 g/dL (ref 3.3–5.5)
ALK PHOS: 87 U/L — AB (ref 26–84)
ALT(SGPT): 20 U/L (ref 10–47)
AST: 20 U/L (ref 11–38)
BUN: 9 mg/dL (ref 7–22)
CO2: 26 mEq/L (ref 18–33)
Calcium: 9.1 mg/dL (ref 8.0–10.3)
Chloride: 104 mEq/L (ref 98–108)
Creat: 1 mg/dl (ref 0.6–1.2)
GLUCOSE: 88 mg/dL (ref 73–118)
POTASSIUM: 3.1 meq/L — AB (ref 3.3–4.7)
Sodium: 144 mEq/L (ref 128–145)
Total Bilirubin: 0.4 mg/dl (ref 0.20–1.60)
Total Protein: 6.6 g/dL (ref 6.4–8.1)

## 2014-03-15 MED ORDER — ONDANSETRON 8 MG/50ML IVPB (CHCC)
8.0000 mg | Freq: Once | INTRAVENOUS | Status: AC
  Administered 2014-03-15: 8 mg via INTRAVENOUS

## 2014-03-15 MED ORDER — DEXAMETHASONE SODIUM PHOSPHATE 10 MG/ML IJ SOLN
10.0000 mg | Freq: Once | INTRAMUSCULAR | Status: AC
  Administered 2014-03-15: 10 mg via INTRAVENOUS

## 2014-03-15 MED ORDER — DOXORUBICIN HCL LIPOSOMAL CHEMO INJECTION 2 MG/ML
20.0000 mg/m2 | Freq: Once | INTRAVENOUS | Status: AC
  Administered 2014-03-15: 36 mg via INTRAVENOUS
  Filled 2014-03-15: qty 18

## 2014-03-15 MED ORDER — SODIUM CHLORIDE 0.9 % IV SOLN
Freq: Once | INTRAVENOUS | Status: AC
  Administered 2014-03-15: 10:00:00 via INTRAVENOUS

## 2014-03-15 MED ORDER — DEXAMETHASONE SODIUM PHOSPHATE 10 MG/ML IJ SOLN
INTRAMUSCULAR | Status: AC
  Filled 2014-03-15: qty 1

## 2014-03-15 NOTE — Progress Notes (Signed)
[facility]  [phone]) [phone] Fax:(336) [phone]  ID: Todd Vincent OB: [birth year] MR#: [id number] OZD#:[id number] Patient Care Team: Thayer Headings, MD as PCP - General (Infectious Diseases) Thayer Headings, MD as PCP - Infectious Diseases (Infectious Diseases) Carren Rang, MD as Attending Physician  DIAGNOSIS: Kaposi's sarcoma - progressive  HIV-asymptomatic  INTERVAL HISTORY:  Todd Vincent is here today for follow-up and Doxil treatment. He recently noticed some of the Kaposi's lesions are becoming more prominent. His lesions have improved. His Echo earlier this month showed an EF of 50-55%This was done earlier this week. His ejection fraction was 50-55%. Dr. Linus Salmons monitors his HIV which is currently under very good control. He denies fever, chills, n/v, cough, headache, dizziness, SOB, chest pain, palpitations, abdominal pain, blood in urine or stool. No swelling, tenderness, numbness or tingling in his extremities. His appetite is good and he is staying hydrated. No c/o pain.   CURRENT TREATMENT: Doxil-to two-week dosing  REVIEW OF SYSTEMS: All other 10 point review of systems is negative.   PAST MEDICAL HISTORY: Past Medical History  Diagnosis Date  . HIV (human immunodeficiency virus infection)   . Anal condyloma   . Allergy   . Cancer   . Kaposi sarcoma 12/08/2012    PAST SURGICAL HISTORY: Past Surgical History  Procedure Laterality Date  . Anal condyloma resection    . Incision and drainage perirectal abscess  08/11/2011    Procedure: IRRIGATION AND DEBRIDEMENT PERIRECTAL ABSCESS;  Surgeon: Edward Jolly, MD;  Location: [facility];  Service: General;  Laterality: N/A;  . Incision and drainage perirectal abscess  08/13/2011    Procedure: IRRIGATION AND DEBRIDEMENT PERIRECTAL ABSCESS;  Surgeon: Edward Jolly, MD;  Location: [facility];  Service: General;  Laterality: N/A;    FAMILY HISTORY Family History  Problem Relation Age of Onset  .  Prostate cancer      GYNECOLOGIC HISTORY:  No LMP for male patient.   SOCIAL HISTORY: History   Social History  . Marital Status: Single    Spouse Name: N/A    Number of Children: N/A  . Years of Education: N/A   Occupational History  . Not on file.   Social History Main Topics  . Smoking status: Former Smoker -- 0.30 packs/day for 15 years    Types: Cigarettes    Start date: 12/21/1997    Quit date: 12/18/2012  . Smokeless tobacco: Never Used     Comment: quit smoking 1 year ago  . Alcohol Use: 0.5 oz/week    1 drink(s) per week     Comment: occasional wine  . Drug Use: No  . Sexual Activity: Not Currently     Comment: pt. declined condoms   Other Topics Concern  . Not on file   Social History Narrative  . No narrative on file    ADVANCED DIRECTIVES:  <no information>  HEALTH MAINTENANCE: History  Substance Use Topics  . Smoking status: Former Smoker -- 0.30 packs/day for 15 years    Types: Cigarettes    Start date: 12/21/1997    Quit date: 12/18/2012  . Smokeless tobacco: Never Used     Comment: quit smoking 1 year ago  . Alcohol Use: 0.5 oz/week    1 drink(s) per week     Comment: occasional wine   Colonoscopy: PAP: Bone density: Lipid panel:  Allergies  Allergen Reactions  . Aspirin   . Sulfa Antibiotics Rash    Extreme itching  Current Outpatient Prescriptions  Medication Sig Dispense Refill  . acyclovir (ZOVIRAX) 800 MG tablet Take 800 mg by mouth 5 (five) times daily.      . Cholecalciferol (VITAMIN D3) 2000 UNITS TABS Take 2,000 Units by mouth every morning. 2 tabs = 4,000 U daily      . citalopram (CELEXA) 10 MG tablet TAKE 1 TABLET BY MOUTH EVERY DAY  30 tablet  0  . COCONUT OIL PO Take 1,000 mg by mouth 2 (two) times daily.      . Darunavir Ethanolate (PREZISTA) 800 MG tablet TAKE 1 TABLET BY MOUTH EVERY DAY WITH BREAKFAST  30 tablet  5  . emtricitabine-tenofovir (TRUVADA) 200-300 MG per tablet TAKE 1 TABLET BY MOUTH EVERY DAY   30 tablet  5  . ISENTRESS 400 MG tablet TAKE 1 TABLET BY MOUTH TWICE DAILY  60 tablet  0  . LORazepam (ATIVAN) 0.5 MG tablet Take 1 tablet (0.5 mg total) by mouth every 6 (six) hours as needed (Nausea or vomiting).  30 tablet  2  . NORVIR 100 MG TABS tablet TAKE 1 TABLET BY MOUTH DAILY AFTER BREAKFAST  30 tablet  0  . ondansetron (ZOFRAN) 8 MG tablet Take 1 tablet (8 mg total) by mouth 2 (two) times daily. Start the day after chemo for 2 days. Then take as needed for nausea or vomiting.  20 tablet  0  . PREZISTA 800 MG tablet TAKE 1 TABLET BY MOUTH EVERY DAY WITH BREAKFAST  30 tablet  0  . prochlorperazine (COMPAZINE) 10 MG tablet Take 1 tablet (10 mg total) by mouth every 6 (six) hours as needed for nausea or vomiting.  30 tablet  0  . raltegravir (ISENTRESS) 400 MG tablet TAKE 1 TABLET BY MOUTH TWICE DAILY  60 tablet  5  . ritonavir (NORVIR) 100 MG TABS tablet TAKE 1 TABLET BY MOUTH DAILY AFTER BREAKFAST  30 tablet  5  . traZODone (DESYREL) 100 MG tablet TAKE ONE TABLET BY MOUTH EVERY NIGHT AT BEDTIME  30 tablet  0  . triamterene-hydrochlorothiazide (MAXZIDE-25) 37.5-25 MG per tablet Take 1 tablet daily if needed for leg swelling  30 tablet  6  . TRUVADA 200-300 MG per tablet TAKE 1 TABLET BY MOUTH DAILY  30 tablet  0   No current facility-administered medications for this visit.   Facility-Administered Medications Ordered in Other Visits  Medication Dose Route Frequency Provider Last Rate Last Dose  . DOXOrubicin HCL LIPOSOMAL (DOXIL) 36 mg in dextrose 5 % 250 mL chemo infusion  20 mg/m2 (Treatment Plan Actual) Intravenous Once [name], MD      . ondansetron (ZOFRAN) IVPB 8 mg  8 mg Intravenous Once [name], MD   8 mg at 03/15/14 1000   OBJECTIVE: Filed Vitals:   03/15/14 0912  BP: 106/66  Pulse: 77  Temp: 98 F (36.7 C)  Resp: 18    Filed Weights   03/15/14 0912  Weight: 147 lb (66.679 kg)   ECOG FS:0 - Asymptomatic Ocular: Sclerae unicteric, pupils equal, round  and reactive to light Ear-nose-throat: Oropharynx clear, dentition fair Lymphatic: No cervical or supraclavicular adenopathy Lungs no rales or rhonchi, good excursion bilaterally Heart regular rate and rhythm, no murmur appreciated Abd soft, nontender, positive bowel sounds MSK no focal spinal tenderness, no joint edema Neuro: non-focal, well-oriented, appropriate affect  LAB RESULTS: CMP     Component Value Date/Time   NA 144 03/15/2014 0835   NA 140 12/20/2013 0759  K 3.1* 03/15/2014 0835   K 4.1 12/20/2013 0759   CL 104 03/15/2014 0835   CL 106 12/20/2013 0759   CO2 26 03/15/2014 0835   CO2 26 12/20/2013 0759   GLUCOSE 88 03/15/2014 0835   GLUCOSE 89 12/20/2013 0759   BUN 9 03/15/2014 0835   BUN 11 12/20/2013 0759   CREATININE 1.0 03/15/2014 0835   CREATININE 1.09 12/20/2013 0759   CALCIUM 9.1 03/15/2014 0835   CALCIUM 9.4 12/20/2013 0759   PROT 6.6 03/15/2014 0835   PROT 6.7 12/20/2013 0759   ALBUMIN 4.2 12/20/2013 0759   AST 20 03/15/2014 0835   AST 16 12/20/2013 0759   ALT 20 03/15/2014 0835   ALT 13 12/20/2013 0759   ALKPHOS 87* 03/15/2014 0835   ALKPHOS 86 12/20/2013 0759   BILITOT 0.40 03/15/2014 0835   BILITOT 0.4 12/20/2013 0759   GFRNONAA 87 10/25/2012 0930   GFRNONAA >90 08/11/2011 0936   GFRAA >89 10/25/2012 0930   GFRAA >90 08/11/2011 0936   No results found for this basename: SPEP, UPEP,  kappa and lambda light chains   Lab Results  Component Value Date   WBC 7.7 03/15/2014   NEUTROABS 4.3 03/15/2014   HGB 14.8 03/15/2014   HCT 42.3 03/15/2014   MCV 90 03/15/2014   PLT 276 03/15/2014   No results found for this basename: LABCA2   No components found with this basename: LABCA125   No results found for this basename: INR,  in the last 168 hours  STUDIES: No results found.  ASSESSMENT/PLAN: Todd Vincent is 38 year old African gentleman with history of Kaposi's. We treated him with Doxil. He did well with Doxil a year ago. He recently noticed Kaposi's lesions again so we  have begun treating him with Doxil again. His lesions are looking better.  We will proceed with his treatment today as planned.  His CBC and CMP today are good.  He has his treatment and appointment schedule.  He knows to call here with any questions or concerns and to go to the ED in the event of an emergency. We can certainly see him sooner if need be.   Eliezer Bottom, NP 03/15/2014 10:07 AM

## 2014-03-15 NOTE — Patient Instructions (Signed)
Doxorubicin Liposomal injection What is this medicine? LIPOSOMAL DOXORUBICIN (LIP oh som al dox oh ROO bi sin) is a chemotherapy drug. This medicine is used to treat many kinds of cancer like Kaposi's sarcoma, multiple myeloma, and ovarian cancer. This medicine may be used for other purposes; ask your health care provider or pharmacist if you have questions. COMMON BRAND NAME(S): Doxil, Lipodox What should I tell my health care provider before I take this medicine? They need to know if you have any of these conditions: -blood disorders -heart disease -infection (especially a virus infection such as chickenpox, cold sores, or herpes) -liver disease -recent or ongoing radiation therapy -an unusual or allergic reaction to doxorubicin, other chemotherapy agents, soybeans, other medicines, foods, dyes, or preservatives -pregnant or trying to get pregnant -breast-feeding How should I use this medicine? This drug is given as an infusion into a vein. It is administered in a hospital or clinic by a specially trained health care professional. If you have pain, swelling, burning or any unusual feeling around the site of your injection, tell your health care professional right away. Talk to your pediatrician regarding the use of this medicine in children. Special care may be needed. Overdosage: If you think you have taken too much of this medicine contact a poison control center or emergency room at once. NOTE: This medicine is only for you. Do not share this medicine with others. What if I miss a dose? It is important not to miss your dose. Call your doctor or health care professional if you are unable to keep an appointment. What may interact with this medicine? Do not take this medicine with any of the following medications: -zidovudine This medicine may also interact with the following medications: -medicines to increase blood counts like filgrastim, pegfilgrastim, sargramostim -vaccines Talk to  your doctor or health care professional before taking any of these medicines: -acetaminophen -aspirin -ibuprofen -ketoprofen -naproxen This list may not describe all possible interactions. Give your health care provider a list of all the medicines, herbs, non-prescription drugs, or dietary supplements you use. Also tell them if you smoke, drink alcohol, or use illegal drugs. Some items may interact with your medicine. What should I watch for while using this medicine? Your condition will be monitored carefully while you are receiving this medicine. You will need important blood work done while you are taking this medicine. This drug may make you feel generally unwell. This is not uncommon, as chemotherapy can affect healthy cells as well as cancer cells. Report any side effects. Continue your course of treatment even though you feel ill unless your doctor tells you to stop. Your urine may turn orange-red for a few days after your dose. This is not blood. If your urine is dark or brown, call your doctor. In some cases, you may be given additional medicines to help with side effects. Follow all directions for their use. Call your doctor or health care professional for advice if you get a fever (100.5 degrees F or higher), chills or sore throat, or other symptoms of a cold or flu. Do not treat yourself. This drug decreases your body's ability to fight infections. Try to avoid being around people who are sick. This medicine may increase your risk to bruise or bleed. Call your doctor or health care professional if you notice any unusual bleeding. Be careful brushing and flossing your teeth or using a toothpick because you may get an infection or bleed more easily. If you have any dental  work done, tell your dentist you are receiving this medicine. Avoid taking products that contain aspirin, acetaminophen, ibuprofen, naproxen, or ketoprofen unless instructed by your doctor. These medicines may hide a  fever. Men and women of childbearing age should use effective birth control methods while using taking this medicine. Do not become pregnant while taking this medicine. There is a potential for serious side effects to an unborn child. Talk to your health care professional or pharmacist for more information. Do not breast-feed an infant while taking this medicine. Talk to your doctor about your risk of cancer. You may be more at risk for certain types of cancers if you take this medicine. What side effects may I notice from receiving this medicine? Side effects that you should report to your doctor or health care professional as soon as possible: -allergic reactions like skin rash, itching or hives, swelling of the face, lips, or tongue -low blood counts - this medicine may decrease the number of white blood cells, red blood cells and platelets. You may be at increased risk for infections and bleeding. -signs of hand-foot syndrome - tingling or burning, redness, flaking, swelling, small blisters, or small sores on the palms of your hands or the soles of your feet -signs of infection - fever or chills, cough, sore throat, pain or difficulty passing urine -signs of decreased platelets or bleeding - bruising, pinpoint red spots on the skin, black, tarry stools, blood in the urine -signs of decreased red blood cells - unusually weak or tired, fainting spells, lightheadedness -back pain, chills, facial flushing, fever, headache, tightness in the chest or throat during the infusion -breathing problems -chest pain -fast, irregular heartbeat -mouth pain, redness, sores -pain, swelling, redness at site where injected -pain, tingling, numbness in the hands or feet -swelling of ankles, feet, or hands -vomiting Side effects that usually do not require medical attention (report to your doctor or health care professional if they continue or are bothersome): -diarrhea -hair loss -loss of appetite -nail  discoloration or damage -nausea -red or watery eyes -red colored urine -stomach upset This list may not describe all possible side effects. Call your doctor for medical advice about side effects. You may report side effects to FDA at 1-800-FDA-1088. Where should I keep my medicine? This drug is given in a hospital or clinic and will not be stored at home. NOTE: This sheet is a summary. It may not cover all possible information. If you have questions about this medicine, talk to your doctor, pharmacist, or health care provider.  2015, [employer]. (2012-01-22 10:12:56)

## 2014-03-17 LAB — HIV-1 RNA QUANT-NO REFLEX-BLD
HIV 1 RNA Quant: <20 copies/mL (ref <20)
HIV-1 RNA Quant, Log: <1.3 {Log} (ref <1.30)

## 2014-03-17 LAB — LACTATE DEHYDROGENASE: LDH: 129 U/L (ref 94–250)

## 2014-03-19 ENCOUNTER — Encounter: Payer: Self-pay | Admitting: *Deleted

## 2014-03-22 ENCOUNTER — Other Ambulatory Visit: Payer: Self-pay | Admitting: Lab

## 2014-03-22 ENCOUNTER — Ambulatory Visit: Payer: Self-pay | Admitting: Hematology & Oncology

## 2014-03-29 ENCOUNTER — Ambulatory Visit (HOSPITAL_BASED_OUTPATIENT_CLINIC_OR_DEPARTMENT_OTHER): Payer: No Typology Code available for payment source | Admitting: Hematology & Oncology

## 2014-03-29 ENCOUNTER — Encounter: Payer: Self-pay | Admitting: Hematology & Oncology

## 2014-03-29 ENCOUNTER — Other Ambulatory Visit (HOSPITAL_BASED_OUTPATIENT_CLINIC_OR_DEPARTMENT_OTHER): Payer: No Typology Code available for payment source | Admitting: Lab

## 2014-03-29 ENCOUNTER — Ambulatory Visit (HOSPITAL_BASED_OUTPATIENT_CLINIC_OR_DEPARTMENT_OTHER): Payer: No Typology Code available for payment source

## 2014-03-29 VITALS — BP 108/69 | HR 79 | Temp 98.2°F | Resp 18 | Ht 65.0 in | Wt 149.0 lb

## 2014-03-29 DIAGNOSIS — B20 Human immunodeficiency virus [HIV] disease: Secondary | ICD-10-CM

## 2014-03-29 DIAGNOSIS — C469 Kaposi's sarcoma, unspecified: Secondary | ICD-10-CM

## 2014-03-29 DIAGNOSIS — R64 Cachexia: Secondary | ICD-10-CM

## 2014-03-29 DIAGNOSIS — Z5111 Encounter for antineoplastic chemotherapy: Secondary | ICD-10-CM

## 2014-03-29 LAB — CBC WITH DIFFERENTIAL (CANCER CENTER ONLY)
BASO#: 0 10*3/uL (ref 0.0–0.2)
BASO%: 0.5 % (ref 0.0–2.0)
EOS%: 3.1 % (ref 0.0–7.0)
Eosinophils Absolute: 0.2 10*3/uL (ref 0.0–0.5)
HCT: 44.3 % (ref 38.7–49.9)
HGB: 15.2 g/dL (ref 13.0–17.1)
LYMPH#: 2.1 10*3/uL (ref 0.9–3.3)
LYMPH%: 33.1 % (ref 14.0–48.0)
MCH: 31.5 pg (ref 28.0–33.4)
MCHC: 34.3 g/dL (ref 32.0–35.9)
MCV: 92 fL (ref 82–98)
MONO#: 0.4 10*3/uL (ref 0.1–0.9)
MONO%: 6.7 % (ref 0.0–13.0)
NEUT#: 3.6 10*3/uL (ref 1.5–6.5)
NEUT%: 56.6 % (ref 40.0–80.0)
PLATELETS: 238 10*3/uL (ref 145–400)
RBC: 4.83 10*6/uL (ref 4.20–5.70)
RDW: 13.9 % (ref 11.1–15.7)
WBC: 6.4 10*3/uL (ref 4.0–10.0)

## 2014-03-29 LAB — CMP (CANCER CENTER ONLY)
ALT(SGPT): 14 U/L (ref 10–47)
AST: 17 U/L (ref 11–38)
Albumin: 3.4 g/dL (ref 3.3–5.5)
Alkaline Phosphatase: 83 U/L (ref 26–84)
BILIRUBIN TOTAL: 0.4 mg/dL (ref 0.20–1.60)
BUN, Bld: 10 mg/dL (ref 7–22)
CO2: 27 meq/L (ref 18–33)
CREATININE: 1.1 mg/dL (ref 0.6–1.2)
Calcium: 9 mg/dL (ref 8.0–10.3)
Chloride: 102 mEq/L (ref 98–108)
GLUCOSE: 86 mg/dL (ref 73–118)
Potassium: 3.5 mEq/L (ref 3.3–4.7)
SODIUM: 140 meq/L (ref 128–145)
TOTAL PROTEIN: 6.4 g/dL (ref 6.4–8.1)

## 2014-03-29 MED ORDER — DEXAMETHASONE SODIUM PHOSPHATE 10 MG/ML IJ SOLN
INTRAMUSCULAR | Status: AC
  Filled 2014-03-29: qty 1

## 2014-03-29 MED ORDER — ONDANSETRON 8 MG/50ML IVPB (CHCC)
8.0000 mg | Freq: Once | INTRAVENOUS | Status: AC
  Administered 2014-03-29: 8 mg via INTRAVENOUS

## 2014-03-29 MED ORDER — DRONABINOL 5 MG PO CAPS: 5.0000 mg | ORAL_CAPSULE | Freq: Two times a day (BID) | ORAL | Status: DC

## 2014-03-29 MED ORDER — DEXAMETHASONE SODIUM PHOSPHATE 10 MG/ML IJ SOLN
10.0000 mg | Freq: Once | INTRAMUSCULAR | Status: AC
  Administered 2014-03-29: 10 mg via INTRAVENOUS

## 2014-03-29 MED ORDER — DEXTROSE 5 % IV SOLN
20.0000 mg/m2 | Freq: Once | INTRAVENOUS | Status: AC
  Administered 2014-03-29: 36 mg via INTRAVENOUS
  Filled 2014-03-29: qty 18

## 2014-03-29 MED ORDER — SODIUM CHLORIDE 0.9 % IV SOLN
Freq: Once | INTRAVENOUS | Status: AC
  Administered 2014-03-29: 10:00:00 via INTRAVENOUS

## 2014-03-29 NOTE — Progress Notes (Signed)
Hematology and Oncology Follow Up Visit  Todd Vincent [id number] 24-Jul-1975 38 y.o. 03/29/2014   Principle Diagnosis:   Kaposi's sarcoma - progressive  HIV-asymptomatic  Current Therapy:    Doxil- q two-week dosing     Interim History:  Mr.  Todd Vincent is back for followup. He is doing well with treatment. He does feel a little more fatigued with his treatment.  He has noticed that the Kaposi's lesions are improving. They are getting a little bit lighter and feel more flat.  He's had no problems with cough. He's had no nausea or vomiting. He says that he typically feels worse about 3-4 days after each treatment.  We did go ahead and do a echocardiogram on him. This was done earlier this week. His ejection fraction was 50-55%. He sees Dr. Linus Salmons who does his HIV monitoring. Currently the HIV is under very good control. His last HIV levels were not detectable. He did see his Psychologist, sport and exercise. This was the surgeon who performed his rectal abscess drainage. He had this surgery about a year ago. He's done well with this.   Medications: Current outpatient prescriptions: acyclovir (ZOVIRAX) 800 MG tablet, Take 800 mg by mouth 5 (five) times daily., Disp: , Rfl: ;  Cholecalciferol (VITAMIN D3) 2000 UNITS TABS, Take 2,000 Units by mouth every morning. 2 tabs = 4,000 U daily, Disp: , Rfl: ;  citalopram (CELEXA) 10 MG tablet, TAKE 1 TABLET BY MOUTH EVERY DAY, Disp: 30 tablet, Rfl: 0;  COCONUT OIL PO, Take 1,000 mg by mouth 2 (two) times daily., Disp: , Rfl:  Darunavir Ethanolate (PREZISTA) 800 MG tablet, TAKE 1 TABLET BY MOUTH EVERY DAY WITH BREAKFAST, Disp: 30 tablet, Rfl: 5;  emtricitabine-tenofovir (TRUVADA) 200-300 MG per tablet, TAKE 1 TABLET BY MOUTH EVERY DAY, Disp: 30 tablet, Rfl: 5;  ISENTRESS 400 MG tablet, TAKE 1 TABLET BY MOUTH TWICE DAILY, Disp: 60 tablet, Rfl: 0 LORazepam (ATIVAN) 0.5 MG tablet, Take 1 tablet (0.5 mg total) by mouth every 6 (six) hours as needed (Nausea or vomiting)., Disp: 30  tablet, Rfl: 2;  NORVIR 100 MG TABS tablet, TAKE 1 TABLET BY MOUTH DAILY AFTER BREAKFAST, Disp: 30 tablet, Rfl: 0;  ondansetron (ZOFRAN) 8 MG tablet, Take 1 tablet (8 mg total) by mouth 2 (two) times daily. Start the day after chemo for 2 days. Then take as needed for nausea or vomiting., Disp: 20 tablet, Rfl: 0 PREZISTA 800 MG tablet, TAKE 1 TABLET BY MOUTH EVERY DAY WITH BREAKFAST, Disp: 30 tablet, Rfl: 0;  prochlorperazine (COMPAZINE) 10 MG tablet, Take 1 tablet (10 mg total) by mouth every 6 (six) hours as needed for nausea or vomiting., Disp: 30 tablet, Rfl: 0;  raltegravir (ISENTRESS) 400 MG tablet, TAKE 1 TABLET BY MOUTH TWICE DAILY, Disp: 60 tablet, Rfl: 5 traZODone (DESYREL) 100 MG tablet, TAKE ONE TABLET BY MOUTH EVERY NIGHT AT BEDTIME, Disp: 30 tablet, Rfl: 0;  triamterene-hydrochlorothiazide (MAXZIDE-25) 37.5-25 MG per tablet, Take 1 tablet daily if needed for leg swelling, Disp: 30 tablet, Rfl: 6;  TRUVADA 200-300 MG per tablet, TAKE 1 TABLET BY MOUTH DAILY, Disp: 30 tablet, Rfl: 0  Allergies:  Allergies  Allergen Reactions  . Aspirin   . Sulfa Antibiotics Rash    Extreme itching     Past Medical History, Surgical history, Social history, and Family History were reviewed and updated.  Review of Systems: As above  Physical Exam:  height is 5\' 5"  (1.651 m) and weight is 149 lb (67.586 kg). His oral temperature  is 98.2 F (36.8 C). His blood pressure is 108/69 and his pulse is 79. His respiration is 18.   Well-developed and well-nourished Afro-American male. Head and neck exam shows no ocular or oral lesions. He has no palpable cervical or supraclavicular lymph nodes. Lungs are clear. Cardiac exam regular rate and rhythm with no murmurs rubs or bruits. Abdomen is soft. Has good bowel sounds. There is no fluid wave. There is no palpable liver or spleen tip. Neck exam shows no tenderness over the spine ribs or hips. Extremities shows some 1+ nonpitting edema in his legs. His good range  motion of his joints. Has good strength in extremities. Skin exam does show the Kaposi's lesions. They are mostly macular in nature. They appear less prominent. They are a little bit lighter in color. They are flatter. Neurological exam is non-focal.  Lab Results  Component Value Date   WBC 6.4 03/29/2014   HGB 15.2 03/29/2014   HCT 44.3 03/29/2014   MCV 92 03/29/2014   PLT 238 03/29/2014     Chemistry      Component Value Date/Time   NA 144 03/15/2014 0835   NA 140 12/20/2013 0759   K 3.1* 03/15/2014 0835   K 4.1 12/20/2013 0759   CL 104 03/15/2014 0835   CL 106 12/20/2013 0759   CO2 26 03/15/2014 0835   CO2 26 12/20/2013 0759   BUN 9 03/15/2014 0835   BUN 11 12/20/2013 0759   CREATININE 1.0 03/15/2014 0835   CREATININE 1.09 12/20/2013 0759      Component Value Date/Time   CALCIUM 9.1 03/15/2014 0835   CALCIUM 9.4 12/20/2013 0759   ALKPHOS 87* 03/15/2014 0835   ALKPHOS 86 12/20/2013 0759   AST 20 03/15/2014 0835   AST 16 12/20/2013 0759   ALT 20 03/15/2014 0835   ALT 13 12/20/2013 0759   BILITOT 0.40 03/15/2014 0835   BILITOT 0.4 12/20/2013 0759         Impression and Plan: [name] is 38 year old African American gentleman.  He is on Doxil. He is doing well. He is responding.  We will continue the Doxil on an every two-week basis.  I don't see need for any count of scans right now.  With the Thanksgiving holiday in 2 weeks, we will move his treatment back by one week.  His cardiac function is still quite good.  We'll plan to get back to see [place] in another 3 weeks.   [name], MD 11/12/20159:36 AM

## 2014-03-29 NOTE — Addendum Note (Signed)
Addended by: Burney Gauze R on: 03/29/2014 11:52 AM   Modules accepted: Orders

## 2014-03-29 NOTE — Patient Instructions (Signed)
Doxorubicin Liposomal injection What is this medicine? LIPOSOMAL DOXORUBICIN (LIP oh som al dox oh ROO bi sin) is a chemotherapy drug. This medicine is used to treat many kinds of cancer like Kaposi's sarcoma, multiple myeloma, and ovarian cancer. This medicine may be used for other purposes; ask your health care provider or pharmacist if you have questions. COMMON BRAND NAME(S): Doxil, Lipodox What should I tell my health care provider before I take this medicine? They need to know if you have any of these conditions: -blood disorders -heart disease -infection (especially a virus infection such as chickenpox, cold sores, or herpes) -liver disease -recent or ongoing radiation therapy -an unusual or allergic reaction to doxorubicin, other chemotherapy agents, soybeans, other medicines, foods, dyes, or preservatives -pregnant or trying to get pregnant -breast-feeding How should I use this medicine? This drug is given as an infusion into a vein. It is administered in a hospital or clinic by a specially trained health care professional. If you have pain, swelling, burning or any unusual feeling around the site of your injection, tell your health care professional right away. Talk to your pediatrician regarding the use of this medicine in children. Special care may be needed. Overdosage: If you think you have taken too much of this medicine contact a poison control center or emergency room at once. NOTE: This medicine is only for you. Do not share this medicine with others. What if I miss a dose? It is important not to miss your dose. Call your doctor or health care professional if you are unable to keep an appointment. What may interact with this medicine? Do not take this medicine with any of the following medications: -zidovudine This medicine may also interact with the following medications: -medicines to increase blood counts like filgrastim, pegfilgrastim, sargramostim -vaccines Talk to  your doctor or health care professional before taking any of these medicines: -acetaminophen -aspirin -ibuprofen -ketoprofen -naproxen This list may not describe all possible interactions. Give your health care provider a list of all the medicines, herbs, non-prescription drugs, or dietary supplements you use. Also tell them if you smoke, drink alcohol, or use illegal drugs. Some items may interact with your medicine. What should I watch for while using this medicine? Your condition will be monitored carefully while you are receiving this medicine. You will need important blood work done while you are taking this medicine. This drug may make you feel generally unwell. This is not uncommon, as chemotherapy can affect healthy cells as well as cancer cells. Report any side effects. Continue your course of treatment even though you feel ill unless your doctor tells you to stop. Your urine may turn orange-red for a few days after your dose. This is not blood. If your urine is dark or brown, call your doctor. In some cases, you may be given additional medicines to help with side effects. Follow all directions for their use. Call your doctor or health care professional for advice if you get a fever (100.5 degrees F or higher), chills or sore throat, or other symptoms of a cold or flu. Do not treat yourself. This drug decreases your body's ability to fight infections. Try to avoid being around people who are sick. This medicine may increase your risk to bruise or bleed. Call your doctor or health care professional if you notice any unusual bleeding. Be careful brushing and flossing your teeth or using a toothpick because you may get an infection or bleed more easily. If you have any dental  work done, tell your dentist you are receiving this medicine. Avoid taking products that contain aspirin, acetaminophen, ibuprofen, naproxen, or ketoprofen unless instructed by your doctor. These medicines may hide a  fever. Men and women of childbearing age should use effective birth control methods while using taking this medicine. Do not become pregnant while taking this medicine. There is a potential for serious side effects to an unborn child. Talk to your health care professional or pharmacist for more information. Do not breast-feed an infant while taking this medicine. Talk to your doctor about your risk of cancer. You may be more at risk for certain types of cancers if you take this medicine. What side effects may I notice from receiving this medicine? Side effects that you should report to your doctor or health care professional as soon as possible: -allergic reactions like skin rash, itching or hives, swelling of the face, lips, or tongue -low blood counts - this medicine may decrease the number of white blood cells, red blood cells and platelets. You may be at increased risk for infections and bleeding. -signs of hand-foot syndrome - tingling or burning, redness, flaking, swelling, small blisters, or small sores on the palms of your hands or the soles of your feet -signs of infection - fever or chills, cough, sore throat, pain or difficulty passing urine -signs of decreased platelets or bleeding - bruising, pinpoint red spots on the skin, black, tarry stools, blood in the urine -signs of decreased red blood cells - unusually weak or tired, fainting spells, lightheadedness -back pain, chills, facial flushing, fever, headache, tightness in the chest or throat during the infusion -breathing problems -chest pain -fast, irregular heartbeat -mouth pain, redness, sores -pain, swelling, redness at site where injected -pain, tingling, numbness in the hands or feet -swelling of ankles, feet, or hands -vomiting Side effects that usually do not require medical attention (report to your doctor or health care professional if they continue or are bothersome): -diarrhea -hair loss -loss of appetite -nail  discoloration or damage -nausea -red or watery eyes -red colored urine -stomach upset This list may not describe all possible side effects. Call your doctor for medical advice about side effects. You may report side effects to FDA at 1-800-FDA-1088. Where should I keep my medicine? This drug is given in a hospital or clinic and will not be stored at home. NOTE: This sheet is a summary. It may not cover all possible information. If you have questions about this medicine, talk to your doctor, pharmacist, or health care provider.  2015, [employer]/Gold Standard. (2012-01-22 10:12:56) [facility] Discharge Instructions for Patients Receiving Chemotherapy  Today you received the following chemotherapy agents DOXIL  To help prevent nausea and vomiting after your treatment, we encourage you to take your nausea medication as prescribed   If you develop nausea and vomiting that is not controlled by your nausea medication, call the clinic.   BELOW ARE SYMPTOMS THAT SHOULD BE REPORTED IMMEDIATELY:  *FEVER GREATER THAN 100.5 F  *CHILLS WITH OR WITHOUT FEVER  NAUSEA AND VOMITING THAT IS NOT CONTROLLED WITH YOUR NAUSEA MEDICATION  *UNUSUAL SHORTNESS OF BREATH  *UNUSUAL BRUISING OR BLEEDING  TENDERNESS IN MOUTH AND THROAT WITH OR WITHOUT PRESENCE OF ULCERS  *URINARY PROBLEMS  *BOWEL PROBLEMS  UNUSUAL RASH Items with * indicate a potential emergency and should be followed up as soon as possible.  Feel free to call the clinic you have any questions or concerns. The clinic phone number is [phone].

## 2014-03-30 ENCOUNTER — Other Ambulatory Visit: Payer: Self-pay | Admitting: Internal Medicine

## 2014-03-30 ENCOUNTER — Other Ambulatory Visit: Payer: Self-pay | Admitting: Hematology & Oncology

## 2014-03-30 ENCOUNTER — Other Ambulatory Visit: Payer: Self-pay | Admitting: *Deleted

## 2014-03-30 DIAGNOSIS — F32A Depression, unspecified: Secondary | ICD-10-CM

## 2014-03-30 DIAGNOSIS — F329 Major depressive disorder, single episode, unspecified: Secondary | ICD-10-CM

## 2014-03-30 DIAGNOSIS — B20 Human immunodeficiency virus [HIV] disease: Secondary | ICD-10-CM

## 2014-03-30 LAB — LACTATE DEHYDROGENASE: LDH: 140 U/L (ref 94–250)

## 2014-03-30 LAB — HIV-1 RNA QUANT-NO REFLEX-BLD
HIV 1 RNA Quant: 78 copies/mL — ABNORMAL HIGH (ref <20)
HIV-1 RNA QUANT, LOG: 1.89 {Log} — AB (ref <1.30)

## 2014-04-05 DIAGNOSIS — Z87898 Personal history of other specified conditions: Secondary | ICD-10-CM | POA: Insufficient documentation

## 2014-04-05 DIAGNOSIS — Z8719 Personal history of other diseases of the digestive system: Secondary | ICD-10-CM | POA: Insufficient documentation

## 2014-04-10 ENCOUNTER — Other Ambulatory Visit: Payer: Self-pay

## 2014-04-11 ENCOUNTER — Ambulatory Visit: Payer: No Typology Code available for payment source

## 2014-04-11 ENCOUNTER — Other Ambulatory Visit: Payer: Self-pay | Admitting: Lab

## 2014-04-11 ENCOUNTER — Ambulatory Visit: Payer: Self-pay | Admitting: Hematology & Oncology

## 2014-04-19 ENCOUNTER — Ambulatory Visit (HOSPITAL_BASED_OUTPATIENT_CLINIC_OR_DEPARTMENT_OTHER): Payer: No Typology Code available for payment source

## 2014-04-19 ENCOUNTER — Ambulatory Visit (HOSPITAL_BASED_OUTPATIENT_CLINIC_OR_DEPARTMENT_OTHER): Payer: No Typology Code available for payment source | Admitting: Lab

## 2014-04-19 ENCOUNTER — Ambulatory Visit (HOSPITAL_BASED_OUTPATIENT_CLINIC_OR_DEPARTMENT_OTHER): Payer: No Typology Code available for payment source | Admitting: Family

## 2014-04-19 ENCOUNTER — Encounter: Payer: Self-pay | Admitting: Family

## 2014-04-19 ENCOUNTER — Other Ambulatory Visit: Payer: Self-pay

## 2014-04-19 DIAGNOSIS — B20 Human immunodeficiency virus [HIV] disease: Secondary | ICD-10-CM

## 2014-04-19 DIAGNOSIS — C469 Kaposi's sarcoma, unspecified: Secondary | ICD-10-CM

## 2014-04-19 DIAGNOSIS — Z5111 Encounter for antineoplastic chemotherapy: Secondary | ICD-10-CM

## 2014-04-19 DIAGNOSIS — Z79899 Other long term (current) drug therapy: Secondary | ICD-10-CM

## 2014-04-19 DIAGNOSIS — R64 Cachexia: Secondary | ICD-10-CM

## 2014-04-19 DIAGNOSIS — Z113 Encounter for screening for infections with a predominantly sexual mode of transmission: Secondary | ICD-10-CM

## 2014-04-19 LAB — CBC WITH DIFFERENTIAL (CANCER CENTER ONLY)
BASO#: 0 10*3/uL (ref 0.0–0.2)
BASO%: 0.5 % (ref 0.0–2.0)
EOS%: 2.8 % (ref 0.0–7.0)
Eosinophils Absolute: 0.2 10*3/uL (ref 0.0–0.5)
HCT: 46 % (ref 38.7–49.9)
HEMOGLOBIN: 15.8 g/dL (ref 13.0–17.1)
LYMPH#: 1.7 10*3/uL (ref 0.9–3.3)
LYMPH%: 26.3 % (ref 14.0–48.0)
MCH: 31.5 pg (ref 28.0–33.4)
MCHC: 34.3 g/dL (ref 32.0–35.9)
MCV: 92 fL (ref 82–98)
MONO#: 0.5 10*3/uL (ref 0.1–0.9)
MONO%: 6.9 % (ref 0.0–13.0)
NEUT%: 63.5 % (ref 40.0–80.0)
NEUTROS ABS: 4.1 10*3/uL (ref 1.5–6.5)
PLATELETS: 260 10*3/uL (ref 145–400)
RBC: 5.01 10*6/uL (ref 4.20–5.70)
RDW: 14.1 % (ref 11.1–15.7)
WBC: 6.5 10*3/uL (ref 4.0–10.0)

## 2014-04-19 LAB — CMP (CANCER CENTER ONLY)
ALBUMIN: 3.6 g/dL (ref 3.3–5.5)
ALK PHOS: 90 U/L — AB (ref 26–84)
ALT(SGPT): 14 U/L (ref 10–47)
AST: 22 U/L (ref 11–38)
BUN: 8 mg/dL (ref 7–22)
CO2: 31 mEq/L (ref 18–33)
Calcium: 9.4 mg/dL (ref 8.0–10.3)
Chloride: 103 mEq/L (ref 98–108)
Creat: 1.3 mg/dl — ABNORMAL HIGH (ref 0.6–1.2)
Glucose, Bld: 97 mg/dL (ref 73–118)
Potassium: 4 mEq/L (ref 3.3–4.7)
SODIUM: 146 meq/L — AB (ref 128–145)
Total Bilirubin: 0.5 mg/dl (ref 0.20–1.60)
Total Protein: 7.5 g/dL (ref 6.4–8.1)

## 2014-04-19 MED ORDER — DOXORUBICIN HCL LIPOSOMAL CHEMO INJECTION 2 MG/ML
20.0000 mg/m2 | Freq: Once | INTRAVENOUS | Status: AC
  Administered 2014-04-19: 36 mg via INTRAVENOUS
  Filled 2014-04-19: qty 18

## 2014-04-19 MED ORDER — DEXAMETHASONE SODIUM PHOSPHATE 10 MG/ML IJ SOLN
INTRAMUSCULAR | Status: AC
  Filled 2014-04-19: qty 1

## 2014-04-19 MED ORDER — SODIUM CHLORIDE 0.9 % IV SOLN
Freq: Once | INTRAVENOUS | Status: AC
  Administered 2014-04-19: 12:00:00 via INTRAVENOUS

## 2014-04-19 MED ORDER — ONDANSETRON 8 MG/50ML IVPB (CHCC)
8.0000 mg | Freq: Once | INTRAVENOUS | Status: AC
  Administered 2014-04-19: 8 mg via INTRAVENOUS

## 2014-04-19 MED ORDER — DEXAMETHASONE SODIUM PHOSPHATE 10 MG/ML IJ SOLN
10.0000 mg | Freq: Once | INTRAMUSCULAR | Status: AC
  Administered 2014-04-19: 10 mg via INTRAVENOUS

## 2014-04-19 NOTE — Progress Notes (Signed)
[facility]  [phone] [phone]  ID: Alda Lea OB: Jan 02, 1976 MR#: [id number] OHY#:[id number] Patient Care Team: Thayer Headings, MD as PCP - General (Infectious Diseases) Thayer Headings, MD as PCP - Infectious Diseases (Infectious Diseases) Carren Rang, MD as Attending Physician  DIAGNOSIS: Kaposi's sarcoma - progressive  HIV-asymptomatic  INTERVAL HISTORY:  Mr. Woolum is here today for follow-up and Doxil treatment. He recently noticed some of the Kaposi's lesions are becoming more prominent. His lesions continue to improve. He is doing much better. His Echo earlier this month showed an EF of 50-55%This was done earlier this week. His ejection fraction was 50-55%.  Dr. Linus Salmons monitors his HIV which is currently under very good control. His last levels were undetectable. He denies fever, chills, n/v, cough, headache, dizziness, SOB, chest pain, palpitations, abdominal pain, blood in urine or stool.  No swelling, tenderness, numbness or tingling in his extremities.  His appetite is good and he is staying hydrated. His weight is stable.  CURRENT TREATMENT: Doxil-to two-week dosing  REVIEW OF SYSTEMS: All other 10 point review of systems is negative.   PAST MEDICAL HISTORY: Past Medical History  Diagnosis Date  . HIV (human immunodeficiency virus infection)   . Anal condyloma   . Allergy   . Cancer   . Kaposi sarcoma 12/08/2012    PAST SURGICAL HISTORY: Past Surgical History  Procedure Laterality Date  . Anal condyloma resection    . Incision and drainage perirectal abscess  08/11/2011    Procedure: IRRIGATION AND DEBRIDEMENT PERIRECTAL ABSCESS;  Surgeon: Edward Jolly, MD;  Location: [facility];  Service: General;  Laterality: N/A;  . Incision and drainage perirectal abscess  08/13/2011    Procedure: IRRIGATION AND DEBRIDEMENT PERIRECTAL ABSCESS;  Surgeon: Edward Jolly, MD;  Location: [facility] OR;  Service: General;  Laterality:  N/A;    FAMILY HISTORY Family History  Problem Relation Age of Onset  . Prostate cancer      GYNECOLOGIC HISTORY:  No LMP for male patient.   SOCIAL HISTORY: History   Social History  . Marital Status: Single    Spouse Name: N/A    Number of Children: N/A  . Years of Education: N/A   Occupational History  . Not on file.   Social History Main Topics  . Smoking status: Former Smoker -- 0.30 packs/day for 15 years    Types: Cigarettes    Start date: 12/21/1997    Quit date: 12/18/2012  . Smokeless tobacco: Never Used     Comment: quit smoking 1 year ago  . Alcohol Use: 0.5 oz/week    1 drink(s) per week     Comment: occasional wine  . Drug Use: No  . Sexual Activity: Not Currently     Comment: pt. declined condoms   Other Topics Concern  . Not on file   Social History Narrative    ADVANCED DIRECTIVES:  <no information>  HEALTH MAINTENANCE: History  Substance Use Topics  . Smoking status: Former Smoker -- 0.30 packs/day for 15 years    Types: Cigarettes    Start date: 12/21/1997    Quit date: 12/18/2012  . Smokeless tobacco: Never Used     Comment: quit smoking 1 year ago  . Alcohol Use: 0.5 oz/week    1 drink(s) per week     Comment: occasional wine   Colonoscopy: PAP: Bone density: Lipid panel:  Allergies  Allergen Reactions  . Aspirin   . Sulfa Antibiotics Rash  Extreme itching     Current Outpatient Prescriptions  Medication Sig Dispense Refill  . acyclovir (ZOVIRAX) 800 MG tablet Take 800 mg by mouth 5 (five) times daily.    . Cholecalciferol (VITAMIN D3) 2000 UNITS TABS Take 2,000 Units by mouth every morning. 2 tabs = 4,000 U daily    . citalopram (CELEXA) 10 MG tablet TAKE 1 TABLET BY MOUTH EVERY DAY 30 tablet 5  . COCONUT OIL PO Take 1,000 mg by mouth 2 (two) times daily.    Marland Kitchen dronabinol (MARINOL) 5 MG capsule Take 1 capsule (5 mg total) by mouth 2 (two) times daily before a meal. 60 capsule 0  . ISENTRESS 400 MG tablet TAKE 1  TABLET BY MOUTH TWICE DAILY 60 tablet 5  . LORazepam (ATIVAN) 0.5 MG tablet Take 1 tablet (0.5 mg total) by mouth every 6 (six) hours as needed (Nausea or vomiting). 30 tablet 2  . NORVIR 100 MG TABS tablet TAKE 1 TABLET BY MOUTH EVERY DAY AFTER BREAKFAST 30 tablet 5  . ondansetron (ZOFRAN) 8 MG tablet Take 1 tablet (8 mg total) by mouth 2 (two) times daily. Start the day after chemo for 2 days. Then take as needed for nausea or vomiting. 20 tablet 0  . PREZISTA 800 MG tablet TAKE 1 TABLET BY MOUTH EVERY DAY WITH BREAKFAST 30 tablet 5  . prochlorperazine (COMPAZINE) 10 MG tablet Take 1 tablet (10 mg total) by mouth every 6 (six) hours as needed for nausea or vomiting. 30 tablet 0  . traZODone (DESYREL) 100 MG tablet TAKE ONE TABLET BY MOUTH EVERY NIGHT AT BEDTIME 30 tablet 0  . traZODone (DESYREL) 100 MG tablet TAKE ONE TABLET BY MOUTH EVERY NIGHT AT BEDTIME 30 tablet 0  . triamterene-hydrochlorothiazide (MAXZIDE-25) 37.5-25 MG per tablet Take 1 tablet daily if needed for leg swelling 30 tablet 6  . TRUVADA 200-300 MG per tablet TAKE 1 TABLET BY MOUTH EVERY DAY 30 tablet 5   No current facility-administered medications for this visit.   OBJECTIVE: Filed Vitals:   04/19/14 1025  BP: 131/80  Pulse: 93  Temp: 98.3 F (36.8 C)  Resp: 20    Filed Weights   04/19/14 1025  Weight: 147 lb (66.679 kg)   ECOG FS:0 - Asymptomatic Ocular: Sclerae unicteric, pupils equal, round and reactive to light Ear-nose-throat: Oropharynx clear, dentition fair Lymphatic: No cervical or supraclavicular adenopathy Lungs no rales or rhonchi, good excursion bilaterally Heart regular rate and rhythm, no murmur appreciated Abd soft, nontender, positive bowel sounds MSK no focal spinal tenderness, no joint edema Neuro: non-focal, well-oriented, appropriate affect  LAB RESULTS: CMP     Component Value Date/Time   NA 140 03/29/2014 0836   NA 140 12/20/2013 0759   K 3.5 03/29/2014 0836   K 4.1 12/20/2013 0759    CL 102 03/29/2014 0836   CL 106 12/20/2013 0759   CO2 27 03/29/2014 0836   CO2 26 12/20/2013 0759   GLUCOSE 86 03/29/2014 0836   GLUCOSE 89 12/20/2013 0759   BUN 10 03/29/2014 0836   BUN 11 12/20/2013 0759   CREATININE 1.1 03/29/2014 0836   CREATININE 1.09 12/20/2013 0759   CALCIUM 9.0 03/29/2014 0836   CALCIUM 9.4 12/20/2013 0759   PROT 6.4 03/29/2014 0836   PROT 6.7 12/20/2013 0759   ALBUMIN 4.2 12/20/2013 0759   AST 17 03/29/2014 0836   AST 16 12/20/2013 0759   ALT 14 03/29/2014 0836   ALT 13 12/20/2013 0759   ALKPHOS 83 03/29/2014  0836   ALKPHOS 86 12/20/2013 0759   BILITOT 0.40 03/29/2014 0836   BILITOT 0.4 12/20/2013 0759   GFRNONAA 87 10/25/2012 0930   GFRNONAA >90 08/11/2011 0936   GFRAA >89 10/25/2012 0930   GFRAA >90 08/11/2011 0936   No results found for: SPEP Lab Results  Component Value Date   WBC 6.5 04/19/2014   NEUTROABS 4.1 04/19/2014   HGB 15.8 04/19/2014   HCT 46.0 04/19/2014   MCV 92 04/19/2014   PLT 260 04/19/2014   No results found for: LABCA2 No components found for: LABCA125 No results for input(s): INR in the last 168 hours.  STUDIES: No results found.  ASSESSMENT/PLAN: Mr. Macphail is a 38 year old African American gentleman with history of Kaposi's sarcoma. We treated him with Doxil. He did well with Doxil a year ago. He recently noticed Kaposi's lesions again so we have began treating him with Doxil again. His lesions are looking much better and beginning to fade.  We will proceed with his treatment today as planned.  His CBC and CMP today are good.  He has his treatment and appointment schedule.  He knows to call here with any questions or concerns and to go to the ED in the event of an emergency. We can certainly see him sooner if need be.   Eliezer Bottom, NP 04/19/2014 10:35 AM

## 2014-04-19 NOTE — Patient Instructions (Signed)
[facility] Discharge Instructions for Patients Receiving Chemotherapy  Today you received the following chemotherapy agents Doxil  To help prevent nausea and vomiting after your treatment, we encourage you to take your nausea medication    If you develop nausea and vomiting that is not controlled by your nausea medication, call the clinic.   BELOW ARE SYMPTOMS THAT SHOULD BE REPORTED IMMEDIATELY:  *FEVER GREATER THAN 100.5 F  *CHILLS WITH OR WITHOUT FEVER  NAUSEA AND VOMITING THAT IS NOT CONTROLLED WITH YOUR NAUSEA MEDICATION  *UNUSUAL SHORTNESS OF BREATH  *UNUSUAL BRUISING OR BLEEDING  TENDERNESS IN MOUTH AND THROAT WITH OR WITHOUT PRESENCE OF ULCERS  *URINARY PROBLEMS  *BOWEL PROBLEMS  UNUSUAL RASH Items with * indicate a potential emergency and should be followed up as soon as possible.  Feel free to call the clinic you have any questions or concerns. The clinic phone number is [phone].    

## 2014-04-20 ENCOUNTER — Other Ambulatory Visit (INDEPENDENT_AMBULATORY_CARE_PROVIDER_SITE_OTHER): Payer: No Typology Code available for payment source

## 2014-04-20 DIAGNOSIS — Z113 Encounter for screening for infections with a predominantly sexual mode of transmission: Secondary | ICD-10-CM

## 2014-04-20 DIAGNOSIS — B20 Human immunodeficiency virus [HIV] disease: Secondary | ICD-10-CM

## 2014-04-20 DIAGNOSIS — Z79899 Other long term (current) drug therapy: Secondary | ICD-10-CM

## 2014-04-20 LAB — CBC WITH DIFFERENTIAL/PLATELET
BASOS PCT: 0 % (ref 0–1)
Basophils Absolute: 0 10*3/uL (ref 0.0–0.1)
Eosinophils Absolute: 0 10*3/uL (ref 0.0–0.7)
Eosinophils Relative: 0 % (ref 0–5)
HCT: 44.4 % (ref 39.0–52.0)
HEMOGLOBIN: 15.5 g/dL (ref 13.0–17.0)
LYMPHS ABS: 1.2 10*3/uL (ref 0.7–4.0)
Lymphocytes Relative: 10 % — ABNORMAL LOW (ref 12–46)
MCH: 30.9 pg (ref 26.0–34.0)
MCHC: 34.9 g/dL (ref 30.0–36.0)
MCV: 88.6 fL (ref 78.0–100.0)
MPV: 9.8 fL (ref 9.4–12.4)
Monocytes Absolute: 0.5 10*3/uL (ref 0.1–1.0)
Monocytes Relative: 4 % (ref 3–12)
NEUTROS PCT: 86 % — AB (ref 43–77)
Neutro Abs: 10.3 10*3/uL — ABNORMAL HIGH (ref 1.7–7.7)
Platelets: 314 10*3/uL (ref 150–400)
RBC: 5.01 MIL/uL (ref 4.22–5.81)
RDW: 14.5 % (ref 11.5–15.5)
WBC: 12 10*3/uL — AB (ref 4.0–10.5)

## 2014-04-20 LAB — COMPLETE METABOLIC PANEL WITH GFR
ALT: 13 U/L (ref 0–53)
AST: 14 U/L (ref 0–37)
Albumin: 4.1 g/dL (ref 3.5–5.2)
Alkaline Phosphatase: 85 U/L (ref 39–117)
BILIRUBIN TOTAL: 0.3 mg/dL (ref 0.2–1.2)
BUN: 13 mg/dL (ref 6–23)
CO2: 26 mEq/L (ref 19–32)
Calcium: 9.8 mg/dL (ref 8.4–10.5)
Chloride: 102 mEq/L (ref 96–112)
Creat: 1.03 mg/dL (ref 0.50–1.35)
GFR, Est African American: >89 mL/min
GLUCOSE: 164 mg/dL — AB (ref 70–99)
Potassium: 4.5 mEq/L (ref 3.5–5.3)
Sodium: 140 mEq/L (ref 135–145)
TOTAL PROTEIN: 6.8 g/dL (ref 6.0–8.3)

## 2014-04-20 LAB — LIPID PANEL
Cholesterol: 195 mg/dL (ref 0–200)
HDL: 66 mg/dL (ref >39)
LDL CALC: 111 mg/dL — AB (ref 0–99)
Total CHOL/HDL Ratio: 3 Ratio
Triglycerides: 92 mg/dL (ref <150)
VLDL: 18 mg/dL (ref 0–40)

## 2014-04-20 LAB — T-HELPER CELL (CD4) - (RCID CLINIC ONLY)
CD4 T CELL ABS: 130 /uL — AB (ref 400–2700)
CD4 T CELL HELPER: 11 % — AB (ref 33–55)

## 2014-04-20 LAB — RPR

## 2014-04-23 LAB — HIV-1 RNA QUANT-NO REFLEX-BLD: HIV-1 RNA Quant, Log: <1.3 {Log} (ref <1.30)

## 2014-04-25 ENCOUNTER — Other Ambulatory Visit: Payer: Self-pay | Admitting: Lab

## 2014-04-25 ENCOUNTER — Ambulatory Visit: Payer: No Typology Code available for payment source

## 2014-04-25 ENCOUNTER — Ambulatory Visit: Payer: Self-pay | Admitting: Hematology & Oncology

## 2014-04-26 ENCOUNTER — Ambulatory Visit: Payer: Self-pay | Admitting: Internal Medicine

## 2014-04-27 ENCOUNTER — Other Ambulatory Visit: Payer: Self-pay | Admitting: Hematology & Oncology

## 2014-04-27 DIAGNOSIS — C469 Kaposi's sarcoma, unspecified: Secondary | ICD-10-CM

## 2014-04-27 DIAGNOSIS — R64 Cachexia: Secondary | ICD-10-CM

## 2014-04-27 MED ORDER — DRONABINOL 5 MG PO CAPS: 5.0000 mg | ORAL_CAPSULE | Freq: Two times a day (BID) | ORAL | Status: DC

## 2014-05-01 ENCOUNTER — Encounter: Payer: Self-pay | Admitting: Internal Medicine

## 2014-05-01 ENCOUNTER — Ambulatory Visit (INDEPENDENT_AMBULATORY_CARE_PROVIDER_SITE_OTHER): Payer: Self-pay | Admitting: Internal Medicine

## 2014-05-01 VITALS — BP 132/89 | HR 77 | Temp 98.3°F | Ht 67.0 in | Wt 147.0 lb

## 2014-05-01 DIAGNOSIS — B20 Human immunodeficiency virus [HIV] disease: Secondary | ICD-10-CM

## 2014-05-01 DIAGNOSIS — C469 Kaposi's sarcoma, unspecified: Secondary | ICD-10-CM

## 2014-05-01 DIAGNOSIS — B029 Zoster without complications: Secondary | ICD-10-CM

## 2014-05-01 MED ORDER — DAPSONE 100 MG PO TABS: 100.0000 mg | ORAL_TABLET | Freq: Every day | ORAL | Status: DC

## 2014-05-01 NOTE — Assessment & Plan Note (Signed)
He will continue on chemotherapy per oncology. He is on Marinol for appetite.

## 2014-05-01 NOTE — Assessment & Plan Note (Signed)
He is doing well on his regimen. CD4 is below 200 from the chemotherapy and therefore I will start him on dapsone prophylaxis. He will return in 4 months.

## 2014-05-01 NOTE — Assessment & Plan Note (Signed)
He will continue to use Valtrex for outbreaks. If he continues to have more issues, we could consider Neurontin.

## 2014-05-01 NOTE — Progress Notes (Signed)
  Subjective:    Patient ID: Todd Vincent, male    DOB: 01-03-1976, 38 y.o.   MRN: [id number]  HPI He comes in for routine follow up  He continues on Isentress and truvada, prezista, norvir and denies any missed doses.  He also is on doxil for Kaposi's sarcoma which she restarted in September.   His CD4 is 170.  No new issues.  No weight loss, no diarrhea.  Weight is stable.  No new complaints.  Has had a. Anal zoster outbreak  Review of Systems  Constitutional: Negative for fever and chills.  HENT: Negative for sore throat and trouble swallowing.   Eyes: Negative for visual disturbance.  Respiratory: Negative for cough and shortness of breath.   Cardiovascular: Negative for chest pain.  Gastrointestinal: Negative for nausea and diarrhea.  Musculoskeletal: Negative for myalgias and arthralgias.  Skin: Positive for rash.       KS  Neurological: Negative for dizziness, light-headedness and headaches.  Hematological: Negative for adenopathy.  Psychiatric/Behavioral: Negative for dysphoric mood.       Objective:   Physical Exam  Constitutional: He appears well-developed and well-nourished. No distress.  HENT:  Mouth/Throat: No oropharyngeal exudate.  Eyes: No scleral icterus.  Cardiovascular: Normal rate, regular rhythm and normal heart sounds.   No murmur heard. Pulmonary/Chest: Effort normal and breath sounds normal. No respiratory distress.  Lymphadenopathy:    He has no cervical adenopathy.  Skin:  Continued Kaposi's lesions          Assessment & Plan:

## 2014-05-03 ENCOUNTER — Ambulatory Visit: Payer: Self-pay | Admitting: Internal Medicine

## 2014-05-03 ENCOUNTER — Encounter: Payer: Self-pay | Admitting: Family

## 2014-05-03 ENCOUNTER — Ambulatory Visit: Payer: No Typology Code available for payment source

## 2014-05-03 ENCOUNTER — Ambulatory Visit (HOSPITAL_BASED_OUTPATIENT_CLINIC_OR_DEPARTMENT_OTHER): Payer: No Typology Code available for payment source | Admitting: Family

## 2014-05-03 ENCOUNTER — Other Ambulatory Visit (HOSPITAL_BASED_OUTPATIENT_CLINIC_OR_DEPARTMENT_OTHER): Payer: No Typology Code available for payment source | Admitting: Lab

## 2014-05-03 DIAGNOSIS — B20 Human immunodeficiency virus [HIV] disease: Secondary | ICD-10-CM

## 2014-05-03 DIAGNOSIS — B029 Zoster without complications: Secondary | ICD-10-CM

## 2014-05-03 DIAGNOSIS — C469 Kaposi's sarcoma, unspecified: Secondary | ICD-10-CM

## 2014-05-03 LAB — CMP (CANCER CENTER ONLY)
ALBUMIN: 3.4 g/dL (ref 3.3–5.5)
ALK PHOS: 76 U/L (ref 26–84)
ALT(SGPT): 15 U/L (ref 10–47)
AST: 16 U/L (ref 11–38)
BUN: 10 mg/dL (ref 7–22)
CO2: 29 mEq/L (ref 18–33)
Calcium: 9.4 mg/dL (ref 8.0–10.3)
Chloride: 100 mEq/L (ref 98–108)
Creat: 1.1 mg/dl (ref 0.6–1.2)
Glucose, Bld: 113 mg/dL (ref 73–118)
POTASSIUM: 3.9 meq/L (ref 3.3–4.7)
Sodium: 143 mEq/L (ref 128–145)
TOTAL PROTEIN: 7.2 g/dL (ref 6.4–8.1)
Total Bilirubin: 0.6 mg/dl (ref 0.20–1.60)

## 2014-05-03 LAB — CBC WITH DIFFERENTIAL (CANCER CENTER ONLY)
BASO#: 0 10*3/uL (ref 0.0–0.2)
BASO%: 0.5 % (ref 0.0–2.0)
EOS ABS: 0.2 10*3/uL (ref 0.0–0.5)
EOS%: 2.4 % (ref 0.0–7.0)
HCT: 42.5 % (ref 38.7–49.9)
HEMOGLOBIN: 14.7 g/dL (ref 13.0–17.1)
LYMPH#: 1.9 10*3/uL (ref 0.9–3.3)
LYMPH%: 29.7 % (ref 14.0–48.0)
MCH: 31.5 pg (ref 28.0–33.4)
MCHC: 34.6 g/dL (ref 32.0–35.9)
MCV: 91 fL (ref 82–98)
MONO#: 0.5 10*3/uL (ref 0.1–0.9)
MONO%: 7.8 % (ref 0.0–13.0)
NEUT%: 59.6 % (ref 40.0–80.0)
NEUTROS ABS: 3.9 10*3/uL (ref 1.5–6.5)
Platelets: 286 10*3/uL (ref 145–400)
RBC: 4.67 10*6/uL (ref 4.20–5.70)
RDW: 14.1 % (ref 11.1–15.7)
WBC: 6.5 10*3/uL (ref 4.0–10.0)

## 2014-05-03 MED ORDER — GABAPENTIN 300 MG PO CAPS: 300.0000 mg | ORAL_CAPSULE | Freq: Three times a day (TID) | ORAL | Status: DC

## 2014-05-03 NOTE — Progress Notes (Signed)
Todd Vincent  [phone] [phone]  ID: Todd Vincent OB: Oct 11, 1975 MR#: [id number] JYN#:[id number] Patient Care Team: Thayer Headings, MD as PCP - General (Infectious Diseases) Thayer Headings, MD as PCP - Infectious Diseases (Infectious Diseases) Carren Rang, MD as Attending Physician  DIAGNOSIS: Kaposi's sarcoma - progressive  HIV-asymptomatic  INTERVAL HISTORY:  Todd Vincent is here today for follow-up and Doxil treatment. He has developed shingles on his bottom. He is still in the weeping phase but some places are starting to dry out and crust over. He is in a lot of pain and taking lots of Tylenol.  He is also getting over a cold and has had a little dizziness with it. He does have vertigo as well.  His Echo in October showed an EF of 50-55%This was done earlier this week. His ejection fraction was 50-55%.  Dr. Linus Salmons monitors his HIV which is currently under very good control. His last levels earlier this month were undetectable. He denies fever, chills, n/v, cough, headache, SOB, chest pain, palpitations, abdominal pain, blood in urine or stool.  No tenderness, numbness or tingling in his extremities. He has some swelling periodically in his legs. He takes Lasix PRN for this.  His appetite is good and he is staying hydrated. His weight is stable at 147 lbs.  CURRENT TREATMENT: Doxil-to two-week dosing  REVIEW OF SYSTEMS: All other 10 point review of systems is negative.   PAST MEDICAL HISTORY: Past Medical History  Diagnosis Date  . HIV (human immunodeficiency virus infection)   . Anal condyloma   . Allergy   . Cancer   . Kaposi sarcoma 12/08/2012    PAST SURGICAL HISTORY: Past Surgical History  Procedure Laterality Date  . Anal condyloma resection    . Incision and drainage perirectal abscess  08/11/2011    Procedure: IRRIGATION AND DEBRIDEMENT PERIRECTAL ABSCESS;  Surgeon: Edward Jolly, MD;  Location: [facility];  Service:  General;  Laterality: N/A;  . Incision and drainage perirectal abscess  08/13/2011    Procedure: IRRIGATION AND DEBRIDEMENT PERIRECTAL ABSCESS;  Surgeon: Edward Jolly, MD;  Location: [facility];  Service: General;  Laterality: N/A;    FAMILY HISTORY Family History  Problem Relation Age of Onset  . Prostate cancer      GYNECOLOGIC HISTORY:  No LMP for male patient.   SOCIAL HISTORY: History   Social History  . Marital Status: Single    Spouse Name: N/A    Number of Children: N/A  . Years of Education: N/A   Occupational History  . Not on file.   Social History Main Topics  . Smoking status: Former Smoker -- 0.30 packs/day for 15 years    Types: Cigarettes    Start date: 12/21/1997    Quit date: 12/18/2012  . Smokeless tobacco: Never Used     Comment: quit smoking 1 year ago  . Alcohol Use: 0.5 oz/week    1 drink(s) per week     Comment: occasional wine  . Drug Use: No  . Sexual Activity: Not Currently     Comment: pt. declined condoms   Other Topics Concern  . Not on file   Social History Narrative    ADVANCED DIRECTIVES:  <no information>  HEALTH MAINTENANCE: History  Substance Use Topics  . Smoking status: Former Smoker -- 0.30 packs/day for 15 years    Types: Cigarettes    Start date: 12/21/1997    Quit date: 12/18/2012  . Smokeless  tobacco: Never Used     Comment: quit smoking 1 year ago  . Alcohol Use: 0.5 oz/week    1 drink(s) per week     Comment: occasional wine   Colonoscopy: PAP: Bone density: Lipid panel:  Allergies  Allergen Reactions  . Sulfa Antibiotics Rash    Extreme itching     Current Outpatient Prescriptions  Medication Sig Dispense Refill  . acyclovir (ZOVIRAX) 800 MG tablet Take 800 mg by mouth 5 (five) times daily.    . Cholecalciferol (VITAMIN D3) 2000 UNITS TABS Take 2,000 Units by mouth every morning. 2 tabs = 4,000 U daily    . citalopram (CELEXA) 10 MG tablet TAKE 1 TABLET BY MOUTH EVERY DAY 30 tablet 5  .  COCONUT OIL PO Take 1,000 mg by mouth 2 (two) times daily.    . dapsone 100 MG tablet Take 1 tablet (100 mg total) by mouth daily. 30 tablet 5  . dronabinol (MARINOL) 5 MG capsule Take 1 capsule (5 mg total) by mouth 2 (two) times daily before a meal. 60 capsule 0  . ISENTRESS 400 MG tablet TAKE 1 TABLET BY MOUTH TWICE DAILY 60 tablet 5  . LORazepam (ATIVAN) 0.5 MG tablet Take 1 tablet (0.5 mg total) by mouth every 6 (six) hours as needed (Nausea or vomiting). 30 tablet 2  . NORVIR 100 MG TABS tablet TAKE 1 TABLET BY MOUTH EVERY DAY AFTER BREAKFAST 30 tablet 5  . ondansetron (ZOFRAN) 8 MG tablet Take 1 tablet (8 mg total) by mouth 2 (two) times daily. Start the day after chemo for 2 days. Then take as needed for nausea or vomiting. 20 tablet 0  . PREZISTA 800 MG tablet TAKE 1 TABLET BY MOUTH EVERY DAY WITH BREAKFAST 30 tablet 5  . prochlorperazine (COMPAZINE) 10 MG tablet Take 1 tablet (10 mg total) by mouth every 6 (six) hours as needed for nausea or vomiting. 30 tablet 0  . traZODone (DESYREL) 100 MG tablet TAKE ONE TABLET BY MOUTH EVERY NIGHT AT BEDTIME 30 tablet 0  . triamterene-hydrochlorothiazide (MAXZIDE-25) 37.5-25 MG per tablet Take 1 tablet daily if needed for leg swelling 30 tablet 6  . TRUVADA 200-300 MG per tablet TAKE 1 TABLET BY MOUTH EVERY DAY 30 tablet 5  . gabapentin (NEURONTIN) 300 MG capsule Take 1 capsule (300 mg total) by mouth 3 (three) times daily. 90 capsule 0   No current facility-administered medications for this visit.   OBJECTIVE: Filed Vitals:   05/03/14 1000  BP: 134/78  Pulse: 95  Temp: 98.3 F (36.8 C)  Resp: 18    Filed Weights   05/03/14 1000  Weight: 147 lb (66.679 kg)   ECOG FS:1 - Symptomatic but completely ambulatory Ocular: Sclerae unicteric, pupils equal, round and reactive to light Ear-nose-throat: Oropharynx clear, dentition fair Lymphatic: No cervical or supraclavicular adenopathy Lungs no rales or rhonchi, good excursion bilaterally Heart  regular rate and rhythm, no murmur appreciated Abd soft, nontender, positive bowel sounds MSK no focal spinal tenderness, no joint edema Neuro: non-focal, well-oriented, appropriate affect  LAB RESULTS: CMP     Component Value Date/Time   NA 143 05/03/2014 0940   NA 140 04/20/2014 1006   K 3.9 05/03/2014 0940   K 4.5 04/20/2014 1006   CL 100 05/03/2014 0940   CL 102 04/20/2014 1006   CO2 29 05/03/2014 0940   CO2 26 04/20/2014 1006   GLUCOSE 113 05/03/2014 0940   GLUCOSE 164* 04/20/2014 1006   BUN 10 05/03/2014  0940   BUN 13 04/20/2014 1006   CREATININE 1.1 05/03/2014 0940   CREATININE 1.09 12/20/2013 0759   CALCIUM 9.4 05/03/2014 0940   CALCIUM 9.8 04/20/2014 1006   PROT 7.2 05/03/2014 0940   PROT 6.8 04/20/2014 1006   ALBUMIN 4.1 04/20/2014 1006   AST 16 05/03/2014 0940   AST 14 04/20/2014 1006   ALT 15 05/03/2014 0940   ALT 13 04/20/2014 1006   ALKPHOS 76 05/03/2014 0940   ALKPHOS 85 04/20/2014 1006   BILITOT 0.60 05/03/2014 0940   BILITOT 0.3 04/20/2014 1006   GFRNONAA >89 04/20/2014 1006   GFRNONAA >90 08/11/2011 0936   GFRAA >89 04/20/2014 1006   GFRAA >90 08/11/2011 0936   No results found for: SPEP Lab Results  Component Value Date   WBC 6.5 05/03/2014   NEUTROABS 3.9 05/03/2014   HGB 14.7 05/03/2014   HCT 42.5 05/03/2014   MCV 91 05/03/2014   PLT 286 05/03/2014   No results found for: LABCA2 No components found for: LABCA125 No results for input(s): INR in the last 168 hours.  STUDIES: No results found.  ASSESSMENT/PLAN: Todd Vincent is a 38 year old African American gentleman with history of Kaposi's sarcoma. We treated him with Doxil. He did well with Doxil a year ago. He recently noticed Kaposi's lesions again so we have began treating him with Doxil again. His lesions are almost completely gone.  He now has shingles on his bottom and between his folds. He is in a lot of pain. He is currently on Valtrex and taking Tylenol for pain.  His WBC is 6.5  today.   I gave him a prescription for Neurontin. Hopefully this will help alleviate his pain.  We will hold off on treatment today and give him a break.  His CBC and CMP today are good.  We will resume treatment on Dec 31 st. He has his schedule and is in agreement with the plan.  He knows to call here with any questions or concerns and to go to the ED in the event of an emergency. We can certainly see him sooner if need be.   Eliezer Bottom, NP 05/03/2014 10:35 AM

## 2014-05-10 ENCOUNTER — Ambulatory Visit: Payer: No Typology Code available for payment source

## 2014-05-10 ENCOUNTER — Ambulatory Visit: Payer: Self-pay | Admitting: Hematology & Oncology

## 2014-05-10 ENCOUNTER — Other Ambulatory Visit: Payer: Self-pay | Admitting: Lab

## 2014-05-17 ENCOUNTER — Encounter: Payer: Self-pay | Admitting: Hematology & Oncology

## 2014-05-17 ENCOUNTER — Other Ambulatory Visit (HOSPITAL_BASED_OUTPATIENT_CLINIC_OR_DEPARTMENT_OTHER): Payer: No Typology Code available for payment source | Admitting: Lab

## 2014-05-17 ENCOUNTER — Ambulatory Visit (HOSPITAL_BASED_OUTPATIENT_CLINIC_OR_DEPARTMENT_OTHER): Payer: No Typology Code available for payment source

## 2014-05-17 ENCOUNTER — Ambulatory Visit (HOSPITAL_BASED_OUTPATIENT_CLINIC_OR_DEPARTMENT_OTHER): Payer: No Typology Code available for payment source | Admitting: Hematology & Oncology

## 2014-05-17 VITALS — BP 117/72 | HR 89 | Temp 98.2°F | Resp 16 | Ht 67.0 in | Wt 152.0 lb

## 2014-05-17 DIAGNOSIS — C469 Kaposi's sarcoma, unspecified: Secondary | ICD-10-CM

## 2014-05-17 DIAGNOSIS — Z5111 Encounter for antineoplastic chemotherapy: Secondary | ICD-10-CM

## 2014-05-17 DIAGNOSIS — B20 Human immunodeficiency virus [HIV] disease: Secondary | ICD-10-CM

## 2014-05-17 LAB — CBC WITH DIFFERENTIAL (CANCER CENTER ONLY)
BASO#: 0 10*3/uL (ref 0.0–0.2)
BASO%: 0.3 % (ref 0.0–2.0)
EOS%: 5 % (ref 0.0–7.0)
Eosinophils Absolute: 0.3 10*3/uL (ref 0.0–0.5)
HEMATOCRIT: 42.4 % (ref 38.7–49.9)
HEMOGLOBIN: 14.4 g/dL (ref 13.0–17.1)
LYMPH#: 2.1 10*3/uL (ref 0.9–3.3)
LYMPH%: 34 % (ref 14.0–48.0)
MCH: 31.7 pg (ref 28.0–33.4)
MCHC: 34 g/dL (ref 32.0–35.9)
MCV: 93 fL (ref 82–98)
MONO#: 0.8 10*3/uL (ref 0.1–0.9)
MONO%: 12.2 % (ref 0.0–13.0)
NEUT#: 3 10*3/uL (ref 1.5–6.5)
NEUT%: 48.5 % (ref 40.0–80.0)
Platelets: 289 10*3/uL (ref 145–400)
RBC: 4.54 10*6/uL (ref 4.20–5.70)
RDW: 14 % (ref 11.1–15.7)
WBC: 6.2 10*3/uL (ref 4.0–10.0)

## 2014-05-17 LAB — CMP (CANCER CENTER ONLY)
ALK PHOS: 74 U/L (ref 26–84)
ALT(SGPT): 16 U/L (ref 10–47)
AST: 21 U/L (ref 11–38)
Albumin: 3.6 g/dL (ref 3.3–5.5)
BILIRUBIN TOTAL: 0.6 mg/dL (ref 0.20–1.60)
BUN: 10 mg/dL (ref 7–22)
CO2: 27 mEq/L (ref 18–33)
Calcium: 9.2 mg/dL (ref 8.0–10.3)
Chloride: 105 mEq/L (ref 98–108)
Creat: 1 mg/dl (ref 0.6–1.2)
GLUCOSE: 101 mg/dL (ref 73–118)
Potassium: 3.8 mEq/L (ref 3.3–4.7)
Sodium: 142 mEq/L (ref 128–145)
Total Protein: 7 g/dL (ref 6.4–8.1)

## 2014-05-17 MED ORDER — SODIUM CHLORIDE 0.9 % IV SOLN
Freq: Once | INTRAVENOUS | Status: AC
  Administered 2014-05-17: 10:00:00 via INTRAVENOUS

## 2014-05-17 MED ORDER — DEXAMETHASONE SODIUM PHOSPHATE 10 MG/ML IJ SOLN
10.0000 mg | Freq: Once | INTRAMUSCULAR | Status: AC
  Administered 2014-05-17: 10 mg via INTRAVENOUS

## 2014-05-17 MED ORDER — ONDANSETRON 8 MG/50ML IVPB (CHCC)
8.0000 mg | Freq: Once | INTRAVENOUS | Status: AC
Start: 2014-05-17 — End: 2014-05-17
  Administered 2014-05-17: 8 mg via INTRAVENOUS

## 2014-05-17 MED ORDER — DEXAMETHASONE SODIUM PHOSPHATE 10 MG/ML IJ SOLN
INTRAMUSCULAR | Status: AC
  Filled 2014-05-17: qty 1

## 2014-05-17 MED ORDER — DOXORUBICIN HCL LIPOSOMAL CHEMO INJECTION 2 MG/ML
20.0000 mg/m2 | Freq: Once | INTRAVENOUS | Status: AC
  Administered 2014-05-17: 36 mg via INTRAVENOUS
  Filled 2014-05-17: qty 18

## 2014-05-17 NOTE — Patient Instructions (Signed)
Doxorubicin Liposomal injection What is this medicine? LIPOSOMAL DOXORUBICIN (LIP oh som al dox oh ROO bi sin) is a chemotherapy drug. This medicine is used to treat many kinds of cancer like Kaposi's sarcoma, multiple myeloma, and ovarian cancer. This medicine may be used for other purposes; ask your health care Estefano Victory or pharmacist if you have questions. COMMON BRAND NAME(S): Doxil, Lipodox What should I tell my health care Cailyn Houdek before I take this medicine? They need to know if you have any of these conditions: -blood disorders -heart disease -infection (especially a virus infection such as chickenpox, cold sores, or herpes) -liver disease -recent or ongoing radiation therapy -an unusual or allergic reaction to doxorubicin, other chemotherapy agents, soybeans, other medicines, foods, dyes, or preservatives -pregnant or trying to get pregnant -breast-feeding How should I use this medicine? This drug is given as an infusion into a vein. It is administered in a hospital or clinic by a specially trained health care professional. If you have pain, swelling, burning or any unusual feeling around the site of your injection, tell your health care professional right away. Talk to your pediatrician regarding the use of this medicine in children. Special care may be needed. Overdosage: If you think you have taken too much of this medicine contact a poison control center or emergency room at once. NOTE: This medicine is only for you. Do not share this medicine with others. What if I miss a dose? It is important not to miss your dose. Call your doctor or health care professional if you are unable to keep an appointment. What may interact with this medicine? Do not take this medicine with any of the following medications: -zidovudine This medicine may also interact with the following medications: -medicines to increase blood counts like filgrastim, pegfilgrastim, sargramostim -vaccines Talk to  your doctor or health care professional before taking any of these medicines: -acetaminophen -aspirin -ibuprofen -ketoprofen -naproxen This list may not describe all possible interactions. Give your health care Urania Pearlman a list of all the medicines, herbs, non-prescription drugs, or dietary supplements you use. Also tell them if you smoke, drink alcohol, or use illegal drugs. Some items may interact with your medicine. What should I watch for while using this medicine? Your condition will be monitored carefully while you are receiving this medicine. You will need important blood work done while you are taking this medicine. This drug may make you feel generally unwell. This is not uncommon, as chemotherapy can affect healthy cells as well as cancer cells. Report any side effects. Continue your course of treatment even though you feel ill unless your doctor tells you to stop. Your urine may turn orange-red for a few days after your dose. This is not blood. If your urine is dark or brown, call your doctor. In some cases, you may be given additional medicines to help with side effects. Follow all directions for their use. Call your doctor or health care professional for advice if you get a fever (100.5 degrees F or higher), chills or sore throat, or other symptoms of a cold or flu. Do not treat yourself. This drug decreases your body's ability to fight infections. Try to avoid being around people who are sick. This medicine may increase your risk to bruise or bleed. Call your doctor or health care professional if you notice any unusual bleeding. Be careful brushing and flossing your teeth or using a toothpick because you may get an infection or bleed more easily. If you have any dental  work done, tell your dentist you are receiving this medicine. Avoid taking products that contain aspirin, acetaminophen, ibuprofen, naproxen, or ketoprofen unless instructed by your doctor. These medicines may hide a  fever. Men and women of childbearing age should use effective birth control methods while using taking this medicine. Do not become pregnant while taking this medicine. There is a potential for serious side effects to an unborn child. Talk to your health care professional or pharmacist for more information. Do not breast-feed an infant while taking this medicine. Talk to your doctor about your risk of cancer. You may be more at risk for certain types of cancers if you take this medicine. What side effects may I notice from receiving this medicine? Side effects that you should report to your doctor or health care professional as soon as possible: -allergic reactions like skin rash, itching or hives, swelling of the face, lips, or tongue -low blood counts - this medicine may decrease the number of white blood cells, red blood cells and platelets. You may be at increased risk for infections and bleeding. -signs of hand-foot syndrome - tingling or burning, redness, flaking, swelling, small blisters, or small sores on the palms of your hands or the soles of your feet -signs of infection - fever or chills, cough, sore throat, pain or difficulty passing urine -signs of decreased platelets or bleeding - bruising, pinpoint red spots on the skin, black, tarry stools, blood in the urine -signs of decreased red blood cells - unusually weak or tired, fainting spells, lightheadedness -back pain, chills, facial flushing, fever, headache, tightness in the chest or throat during the infusion -breathing problems -chest pain -fast, irregular heartbeat -mouth pain, redness, sores -pain, swelling, redness at site where injected -pain, tingling, numbness in the hands or feet -swelling of ankles, feet, or hands -vomiting Side effects that usually do not require medical attention (report to your doctor or health care professional if they continue or are bothersome): -diarrhea -hair loss -loss of appetite -nail  discoloration or damage -nausea -red or watery eyes -red colored urine -stomach upset This list may not describe all possible side effects. Call your doctor for medical advice about side effects. You may report side effects to FDA at 1-800-FDA-1088. Where should I keep my medicine? This drug is given in a hospital or clinic and will not be stored at home. NOTE: This sheet is a summary. It may not cover all possible information. If you have questions about this medicine, talk to your doctor, pharmacist, or health care Hasten Sweitzer.  2015, [employer]vier/Gold Standard. (2012-01-22 10:12:56)  

## 2014-05-17 NOTE — Progress Notes (Signed)
Hematology and Oncology Follow Up Visit  Todd Vincent [id number] [birth year] 38 y.o. 05/17/2014   Principle Diagnosis:   Kaposi's sarcoma - progressive  HIV-asymptomatic  Current Therapy:    Doxil- q two-week dosing     Interim History:  [name] is back for followup. He is doing well with treatment. He did have a very nice Christmas. He basically stayed home area did   He does feel a little more fatigued with his treatment.  He has noticed that the Kaposi's lesions are improving. They are getting a little bit lighter and feel more flat.  He's had no problems with cough. He's had no nausea or vomiting. He says that he typically feels worse about 3-4 days after each treatment.  He sees Dr. Linus Vincent who does his HIV monitoring. Currently the HIV is under very good control. His last HIV levels were not detectable.   He's had no cough. There's been no mouth sores. He's had no rashes. Again, he feels that his Kaposi lesions are doing better. He has had some edema in his legs. He is on a diuretic which he takes on occasion.  He is on Marinol for his appetite. He feels that is helping him quite a bit.  His overall performance status is ECOG 1.  Medications: Current outpatient prescriptions: acyclovir (ZOVIRAX) 800 MG tablet, Take 800 mg by mouth 5 (five) times daily., Disp: , Rfl: ;  Cholecalciferol (VITAMIN D3) 2000 UNITS TABS, Take 2,000 Units by mouth every morning. 2 tabs = 4,000 U daily, Disp: , Rfl: ;  citalopram (CELEXA) 10 MG tablet, TAKE 1 TABLET BY MOUTH EVERY DAY, Disp: 30 tablet, Rfl: 5;  COCONUT OIL PO, Take 1,000 mg by mouth 2 (two) times daily., Disp: , Rfl:  dapsone 100 MG tablet, Take 1 tablet (100 mg total) by mouth daily., Disp: 30 tablet, Rfl: 5;  dronabinol (MARINOL) 5 MG capsule, Take 1 capsule (5 mg total) by mouth 2 (two) times daily before a meal., Disp: 60 capsule, Rfl: 0;  gabapentin (NEURONTIN) 300 MG capsule, Take 1 capsule (300 mg total) by mouth 3 (three)  times daily., Disp: 90 capsule, Rfl: 0;  ISENTRESS 400 MG tablet, TAKE 1 TABLET BY MOUTH TWICE DAILY, Disp: 60 tablet, Rfl: 5 LORazepam (ATIVAN) 0.5 MG tablet, Take 1 tablet (0.5 mg total) by mouth every 6 (six) hours as needed (Nausea or vomiting)., Disp: 30 tablet, Rfl: 2;  NORVIR 100 MG TABS tablet, TAKE 1 TABLET BY MOUTH EVERY DAY AFTER BREAKFAST, Disp: 30 tablet, Rfl: 5;  ondansetron (ZOFRAN) 8 MG tablet, Take 1 tablet (8 mg total) by mouth 2 (two) times daily. Start the day after chemo for 2 days. Then take as needed for nausea or vomiting., Disp: 20 tablet, Rfl: 0 PREZISTA 800 MG tablet, TAKE 1 TABLET BY MOUTH EVERY DAY WITH BREAKFAST, Disp: 30 tablet, Rfl: 5;  prochlorperazine (COMPAZINE) 10 MG tablet, Take 1 tablet (10 mg total) by mouth every 6 (six) hours as needed for nausea or vomiting., Disp: 30 tablet, Rfl: 0;  traZODone (DESYREL) 100 MG tablet, TAKE ONE TABLET BY MOUTH EVERY NIGHT AT BEDTIME, Disp: 30 tablet, Rfl: 0 triamterene-hydrochlorothiazide (MAXZIDE-25) 37.5-25 MG per tablet, Take 1 tablet daily if needed for leg swelling, Disp: 30 tablet, Rfl: 6;  TRUVADA 200-300 MG per tablet, TAKE 1 TABLET BY MOUTH EVERY DAY, Disp: 30 tablet, Rfl: 5  Allergies:  Allergies  Allergen Reactions  . Sulfa Antibiotics Rash    Extreme itching  Past Medical History, Surgical history, Social history, and Family History were reviewed and updated.  Review of Systems: As above  Physical Exam:  height is 5\' 7"  (1.702 m) and weight is 152 lb (68.947 kg). His oral temperature is 98.2 F (36.8 C). His blood pressure is 117/72 and his pulse is 89. His respiration is 16.   Well-developed and well-nourished Afro-American male. Head and neck exam shows no ocular or oral lesions. He has no palpable cervical or supraclavicular lymph nodes. Lungs are clear. Cardiac exam regular rate and rhythm with no murmurs rubs or bruits. Abdomen is soft. Has good bowel sounds. There is no fluid wave. There is no  palpable liver or spleen tip. Neck exam shows no tenderness over the spine ribs or hips. Extremities shows some 1+ nonpitting edema in his legs. His good range motion of his joints. Has good strength in extremities. Skin exam does show the Kaposi's lesions. They are mostly macular in nature. They appear less prominent. They are a little bit lighter in color. They are flatter. Neurological exam is non-focal.  Lab Results  Component Value Date   WBC 6.2 05/17/2014   HGB 14.4 05/17/2014   HCT 42.4 05/17/2014   MCV 93 05/17/2014   PLT 289 05/17/2014     Chemistry      Component Value Date/Time   NA 142 05/17/2014 0901   NA 140 04/20/2014 1006   K 3.8 05/17/2014 0901   K 4.5 04/20/2014 1006   CL 105 05/17/2014 0901   CL 102 04/20/2014 1006   CO2 27 05/17/2014 0901   CO2 26 04/20/2014 1006   BUN 10 05/17/2014 0901   BUN 13 04/20/2014 1006   CREATININE 1.0 05/17/2014 0901   CREATININE 1.09 12/20/2013 0759      Component Value Date/Time   CALCIUM 9.2 05/17/2014 0901   CALCIUM 9.8 04/20/2014 1006   ALKPHOS 74 05/17/2014 0901   ALKPHOS 85 04/20/2014 1006   AST 21 05/17/2014 0901   AST 14 04/20/2014 1006   ALT 16 05/17/2014 0901   ALT 13 04/20/2014 1006   BILITOT 0.60 05/17/2014 0901   BILITOT 0.3 04/20/2014 1006         Impression and Plan: Todd Vincent is 38 year old African American gentleman.  He is on Doxil. He is doing well. He is responding.  We will continue the Doxil on an every two-week basis.  I don't see need for any count of scans right now.  His cardiac function is still quite good. His last echocardiogram was done in October. Probably will do another one in February or March.  We'll plan to get back to see [place] in another 2 weeks.   [name], MD 12/31/201510:04 AM

## 2014-05-31 ENCOUNTER — Ambulatory Visit (HOSPITAL_BASED_OUTPATIENT_CLINIC_OR_DEPARTMENT_OTHER): Payer: No Typology Code available for payment source | Admitting: Hematology & Oncology

## 2014-05-31 ENCOUNTER — Encounter: Payer: Self-pay | Admitting: Hematology & Oncology

## 2014-05-31 ENCOUNTER — Ambulatory Visit (HOSPITAL_BASED_OUTPATIENT_CLINIC_OR_DEPARTMENT_OTHER): Payer: No Typology Code available for payment source

## 2014-05-31 ENCOUNTER — Other Ambulatory Visit (HOSPITAL_BASED_OUTPATIENT_CLINIC_OR_DEPARTMENT_OTHER): Payer: No Typology Code available for payment source | Admitting: Lab

## 2014-05-31 VITALS — BP 145/98 | HR 94 | Temp 98.2°F | Resp 18 | Ht 67.0 in | Wt 146.0 lb

## 2014-05-31 DIAGNOSIS — C469 Kaposi's sarcoma, unspecified: Secondary | ICD-10-CM

## 2014-05-31 DIAGNOSIS — B20 Human immunodeficiency virus [HIV] disease: Secondary | ICD-10-CM

## 2014-05-31 DIAGNOSIS — Z5111 Encounter for antineoplastic chemotherapy: Secondary | ICD-10-CM

## 2014-05-31 LAB — CMP (CANCER CENTER ONLY)
ALK PHOS: 82 U/L (ref 26–84)
ALT: 12 U/L (ref 10–47)
AST: 17 U/L (ref 11–38)
Albumin: 3.7 g/dL (ref 3.3–5.5)
BILIRUBIN TOTAL: 0.7 mg/dL (ref 0.20–1.60)
BUN: 8 mg/dL (ref 7–22)
CHLORIDE: 96 meq/L — AB (ref 98–108)
CO2: 29 mEq/L (ref 18–33)
Calcium: 9.4 mg/dL (ref 8.0–10.3)
Creat: 1 mg/dl (ref 0.6–1.2)
GLUCOSE: 139 mg/dL — AB (ref 73–118)
POTASSIUM: 3.8 meq/L (ref 3.3–4.7)
Sodium: 140 mEq/L (ref 128–145)
TOTAL PROTEIN: 7.4 g/dL (ref 6.4–8.1)

## 2014-05-31 LAB — CBC WITH DIFFERENTIAL (CANCER CENTER ONLY)
BASO#: 0 10*3/uL (ref 0.0–0.2)
BASO%: 0.4 % (ref 0.0–2.0)
EOS ABS: 0.2 10*3/uL (ref 0.0–0.5)
EOS%: 2.7 % (ref 0.0–7.0)
HEMATOCRIT: 42.8 % (ref 38.7–49.9)
HEMOGLOBIN: 14.3 g/dL (ref 13.0–17.1)
LYMPH#: 1.7 10*3/uL (ref 0.9–3.3)
LYMPH%: 23.2 % (ref 14.0–48.0)
MCH: 31.5 pg (ref 28.0–33.4)
MCHC: 33.4 g/dL (ref 32.0–35.9)
MCV: 94 fL (ref 82–98)
MONO#: 0.7 10*3/uL (ref 0.1–0.9)
MONO%: 9 % (ref 0.0–13.0)
NEUT#: 4.8 10*3/uL (ref 1.5–6.5)
NEUT%: 64.7 % (ref 40.0–80.0)
Platelets: 285 10*3/uL (ref 145–400)
RBC: 4.54 10*6/uL (ref 4.20–5.70)
RDW: 13.4 % (ref 11.1–15.7)
WBC: 7.5 10*3/uL (ref 4.0–10.0)

## 2014-05-31 LAB — LACTATE DEHYDROGENASE: LDH: 210 U/L (ref 94–250)

## 2014-05-31 MED ORDER — SODIUM CHLORIDE 0.9 % IV SOLN
Freq: Once | INTRAVENOUS | Status: AC
  Administered 2014-05-31: 10:00:00 via INTRAVENOUS

## 2014-05-31 MED ORDER — ONDANSETRON 8 MG/50ML IVPB (CHCC)
8.0000 mg | Freq: Once | INTRAVENOUS | Status: AC
  Administered 2014-05-31: 8 mg via INTRAVENOUS

## 2014-05-31 MED ORDER — DEXAMETHASONE SODIUM PHOSPHATE 10 MG/ML IJ SOLN
10.0000 mg | Freq: Once | INTRAMUSCULAR | Status: AC
  Administered 2014-05-31: 10 mg via INTRAVENOUS

## 2014-05-31 MED ORDER — DEXAMETHASONE SODIUM PHOSPHATE 10 MG/ML IJ SOLN
INTRAMUSCULAR | Status: AC
  Filled 2014-05-31: qty 1

## 2014-05-31 MED ORDER — DOXORUBICIN HCL LIPOSOMAL CHEMO INJECTION 2 MG/ML
20.0000 mg/m2 | Freq: Once | INTRAVENOUS | Status: AC
  Administered 2014-05-31: 36 mg via INTRAVENOUS
  Filled 2014-05-31: qty 18

## 2014-05-31 NOTE — Progress Notes (Signed)
Hematology and Oncology Follow Up Visit  Todd Vincent [id number] 03/06/1976 39 y.o. 05/31/2014   Principle Diagnosis:   Kaposi's sarcoma - progressive  HIV-asymptomatic  Current Therapy:    Doxil- q two-week dosing     Interim History:  [name] is back for followup. He is doing well with treatment. He did have a very nice Christmas and New Year's. He basically stayed home .   He does not feel as fatigued with his treatment.  He has noticed that the Kaposi's lesions are improving. They are getting a little bit lighter and feel more flat.  He's had no problems with cough. He's had no nausea or vomiting. He says that he typically feels worse about 3-4 days after each treatment.  He sees Dr. Linus Salmons who does his HIV monitoring. Currently the HIV is under very good control. His last HIV levels were not detectable.   He's had no cough. There's been no mouth sores. He's had no rashes. Again, he feels that his Kaposi lesions are doing better. He has had some edema in his legs. He is on a diuretic which he takes on occasion.  He is on Marinol for his appetite. He feels that is helping him quite a bit.  His overall performance status is ECOG 1.  Medications:  Current outpatient prescriptions:  .  acyclovir (ZOVIRAX) 800 MG tablet, Take 800 mg by mouth 5 (five) times daily., Disp: , Rfl:  .  Cholecalciferol (VITAMIN D3) 2000 UNITS TABS, Take 2,000 Units by mouth every morning. 2 tabs = 4,000 U daily, Disp: , Rfl:  .  citalopram (CELEXA) 10 MG tablet, TAKE 1 TABLET BY MOUTH EVERY DAY, Disp: 30 tablet, Rfl: 5 .  COCONUT OIL PO, Take 1,000 mg by mouth 2 (two) times daily., Disp: , Rfl:  .  dapsone 100 MG tablet, Take 1 tablet (100 mg total) by mouth daily., Disp: 30 tablet, Rfl: 5 .  dronabinol (MARINOL) 5 MG capsule, Take 1 capsule (5 mg total) by mouth 2 (two) times daily before a meal., Disp: 60 capsule, Rfl: 0 .  gabapentin (NEURONTIN) 300 MG capsule, Take 1 capsule (300 mg total)  by mouth 3 (three) times daily., Disp: 90 capsule, Rfl: 0 .  ISENTRESS 400 MG tablet, TAKE 1 TABLET BY MOUTH TWICE DAILY, Disp: 60 tablet, Rfl: 5 .  LORazepam (ATIVAN) 0.5 MG tablet, Take 1 tablet (0.5 mg total) by mouth every 6 (six) hours as needed (Nausea or vomiting)., Disp: 30 tablet, Rfl: 2 .  NORVIR 100 MG TABS tablet, TAKE 1 TABLET BY MOUTH EVERY DAY AFTER BREAKFAST, Disp: 30 tablet, Rfl: 5 .  ondansetron (ZOFRAN) 8 MG tablet, Take 1 tablet (8 mg total) by mouth 2 (two) times daily. Start the day after chemo for 2 days. Then take as needed for nausea or vomiting., Disp: 20 tablet, Rfl: 0 .  PREZISTA 800 MG tablet, TAKE 1 TABLET BY MOUTH EVERY DAY WITH BREAKFAST, Disp: 30 tablet, Rfl: 5 .  prochlorperazine (COMPAZINE) 10 MG tablet, Take 1 tablet (10 mg total) by mouth every 6 (six) hours as needed for nausea or vomiting., Disp: 30 tablet, Rfl: 0 .  traZODone (DESYREL) 100 MG tablet, TAKE ONE TABLET BY MOUTH EVERY NIGHT AT BEDTIME, Disp: 30 tablet, Rfl: 0 .  triamterene-hydrochlorothiazide (MAXZIDE-25) 37.5-25 MG per tablet, Take 1 tablet daily if needed for leg swelling, Disp: 30 tablet, Rfl: 6 .  TRUVADA 200-300 MG per tablet, TAKE 1 TABLET BY MOUTH EVERY DAY, Disp:  30 tablet, Rfl: 5  Allergies:  Allergies  Allergen Reactions  . Sulfa Antibiotics Rash    Extreme itching     Past Medical History, Surgical history, Social history, and Family History were reviewed and updated.  Review of Systems: As above  Physical Exam:  height is 5\' 7"  (1.702 m) and weight is 146 lb (66.225 kg). His oral temperature is 98.2 F (36.8 C). His blood pressure is 145/98 and his pulse is 94. His respiration is 18.   Well-developed and well-nourished Afro-American male. Head and neck exam shows no ocular or oral lesions. He has no palpable cervical or supraclavicular lymph nodes. Lungs are clear. Cardiac exam regular rate and rhythm with no murmurs rubs or bruits. Abdomen is soft. Has good bowel sounds.  There is no fluid wave. There is no palpable liver or spleen tip. Neck exam shows no tenderness over the spine ribs or hips. Extremities shows some 1+ nonpitting edema in his legs. His good range motion of his joints. Has good strength in extremities. Skin exam does show the Kaposi's lesions. They are mostly macular in nature. They appear less prominent. They are a little bit lighter in color. They are flatter. Neurological exam is non-focal.  Lab Results  Component Value Date   WBC 7.5 05/31/2014   HGB 14.3 05/31/2014   HCT 42.8 05/31/2014   MCV 94 05/31/2014   PLT 285 05/31/2014     Chemistry      Component Value Date/Time   NA 140 05/31/2014 0827   NA 140 04/20/2014 1006   K 3.8 05/31/2014 0827   K 4.5 04/20/2014 1006   CL 96* 05/31/2014 0827   CL 102 04/20/2014 1006   CO2 29 05/31/2014 0827   CO2 26 04/20/2014 1006   BUN 8 05/31/2014 0827   BUN 13 04/20/2014 1006   CREATININE 1.0 05/31/2014 0827   CREATININE 1.09 12/20/2013 0759      Component Value Date/Time   CALCIUM 9.4 05/31/2014 0827   CALCIUM 9.8 04/20/2014 1006   ALKPHOS 82 05/31/2014 0827   ALKPHOS 85 04/20/2014 1006   AST 17 05/31/2014 0827   AST 14 04/20/2014 1006   ALT 12 05/31/2014 0827   ALT 13 04/20/2014 1006   BILITOT 0.70 05/31/2014 0827   BILITOT 0.3 04/20/2014 1006         Impression and Plan: Todd Vincent is 39 year old African American gentleman.  He is on Doxil. He is doing well. He is responding.  We will continue the Doxil on an every two-week basis.  I don't see need for any CT scans right now.  I think that it would be worthwhile to think about trying to move his treatments out to every 3 weeks at some point.  He has 2 more treatments every 2 weeks and then we might go to every 3 weeks.  We probably should check his cardiac function. I don't think he is having an echo done since October.  We'll plan to get back to see [place] in another 2 weeks.   [name], MD 1/14/20169:23 AM

## 2014-05-31 NOTE — Patient Instructions (Signed)
Doxorubicin Liposomal injection What is this medicine? LIPOSOMAL DOXORUBICIN (LIP oh som al dox oh ROO bi sin) is a chemotherapy drug. This medicine is used to treat many kinds of cancer like Kaposi's sarcoma, multiple myeloma, and ovarian cancer. This medicine may be used for other purposes; ask your health care provider or pharmacist if you have questions. COMMON BRAND NAME(S): Doxil, Lipodox What should I tell my health care provider before I take this medicine? They need to know if you have any of these conditions: -blood disorders -heart disease -infection (especially a virus infection such as chickenpox, cold sores, or herpes) -liver disease -recent or ongoing radiation therapy -an unusual or allergic reaction to doxorubicin, other chemotherapy agents, soybeans, other medicines, foods, dyes, or preservatives -pregnant or trying to get pregnant -breast-feeding How should I use this medicine? This drug is given as an infusion into a vein. It is administered in a hospital or clinic by a specially trained health care professional. If you have pain, swelling, burning or any unusual feeling around the site of your injection, tell your health care professional right away. Talk to your pediatrician regarding the use of this medicine in children. Special care may be needed. Overdosage: If you think you have taken too much of this medicine contact a poison control center or emergency room at once. NOTE: This medicine is only for you. Do not share this medicine with others. What if I miss a dose? It is important not to miss your dose. Call your doctor or health care professional if you are unable to keep an appointment. What may interact with this medicine? Do not take this medicine with any of the following medications: -zidovudine This medicine may also interact with the following medications: -medicines to increase blood counts like filgrastim, pegfilgrastim, sargramostim -vaccines Talk to  your doctor or health care professional before taking any of these medicines: -acetaminophen -aspirin -ibuprofen -ketoprofen -naproxen This list may not describe all possible interactions. Give your health care provider a list of all the medicines, herbs, non-prescription drugs, or dietary supplements you use. Also tell them if you smoke, drink alcohol, or use illegal drugs. Some items may interact with your medicine. What should I watch for while using this medicine? Your condition will be monitored carefully while you are receiving this medicine. You will need important blood work done while you are taking this medicine. This drug may make you feel generally unwell. This is not uncommon, as chemotherapy can affect healthy cells as well as cancer cells. Report any side effects. Continue your course of treatment even though you feel ill unless your doctor tells you to stop. Your urine may turn orange-red for a few days after your dose. This is not blood. If your urine is dark or brown, call your doctor. In some cases, you may be given additional medicines to help with side effects. Follow all directions for their use. Call your doctor or health care professional for advice if you get a fever (100.5 degrees F or higher), chills or sore throat, or other symptoms of a cold or flu. Do not treat yourself. This drug decreases your body's ability to fight infections. Try to avoid being around people who are sick. This medicine may increase your risk to bruise or bleed. Call your doctor or health care professional if you notice any unusual bleeding. Be careful brushing and flossing your teeth or using a toothpick because you may get an infection or bleed more easily. If you have any dental  work done, tell your dentist you are receiving this medicine. Avoid taking products that contain aspirin, acetaminophen, ibuprofen, naproxen, or ketoprofen unless instructed by your doctor. These medicines may hide a  fever. Men and women of childbearing age should use effective birth control methods while using taking this medicine. Do not become pregnant while taking this medicine. There is a potential for serious side effects to an unborn child. Talk to your health care professional or pharmacist for more information. Do not breast-feed an infant while taking this medicine. Talk to your doctor about your risk of cancer. You may be more at risk for certain types of cancers if you take this medicine. What side effects may I notice from receiving this medicine? Side effects that you should report to your doctor or health care professional as soon as possible: -allergic reactions like skin rash, itching or hives, swelling of the face, lips, or tongue -low blood counts - this medicine may decrease the number of white blood cells, red blood cells and platelets. You may be at increased risk for infections and bleeding. -signs of hand-foot syndrome - tingling or burning, redness, flaking, swelling, small blisters, or small sores on the palms of your hands or the soles of your feet -signs of infection - fever or chills, cough, sore throat, pain or difficulty passing urine -signs of decreased platelets or bleeding - bruising, pinpoint red spots on the skin, black, tarry stools, blood in the urine -signs of decreased red blood cells - unusually weak or tired, fainting spells, lightheadedness -back pain, chills, facial flushing, fever, headache, tightness in the chest or throat during the infusion -breathing problems -chest pain -fast, irregular heartbeat -mouth pain, redness, sores -pain, swelling, redness at site where injected -pain, tingling, numbness in the hands or feet -swelling of ankles, feet, or hands -vomiting Side effects that usually do not require medical attention (report to your doctor or health care professional if they continue or are bothersome): -diarrhea -hair loss -loss of appetite -nail  discoloration or damage -nausea -red or watery eyes -red colored urine -stomach upset This list may not describe all possible side effects. Call your doctor for medical advice about side effects. You may report side effects to FDA at 1-800-FDA-1088. Where should I keep my medicine? This drug is given in a hospital or clinic and will not be stored at home. NOTE: This sheet is a summary. It may not cover all possible information. If you have questions about this medicine, talk to your doctor, pharmacist, or health care provider.  2015, [employer]. (2012-01-22 10:12:56)

## 2014-06-06 ENCOUNTER — Ambulatory Visit (HOSPITAL_COMMUNITY)
Admission: RE | Admit: 2014-06-06 | Discharge: 2014-06-06 | Disposition: A | Discharge status: Home / Self Care | Payer: No Typology Code available for payment source | Source: Ambulatory Visit | Attending: Hematology & Oncology | Admitting: Hematology & Oncology

## 2014-06-06 ENCOUNTER — Other Ambulatory Visit: Payer: Self-pay | Admitting: Hematology & Oncology

## 2014-06-06 DIAGNOSIS — I517 Cardiomegaly: Secondary | ICD-10-CM

## 2014-06-06 DIAGNOSIS — C469 Kaposi's sarcoma, unspecified: Secondary | ICD-10-CM

## 2014-06-06 DIAGNOSIS — R64 Cachexia: Secondary | ICD-10-CM

## 2014-06-06 DIAGNOSIS — B20 Human immunodeficiency virus [HIV] disease: Secondary | ICD-10-CM | POA: Insufficient documentation

## 2014-06-06 NOTE — Progress Notes (Signed)
  Echocardiogram 2D Echocardiogram has been performed.  Todd Vincent 06/06/2014, 10:05 AM

## 2014-06-07 MED ORDER — ONDANSETRON HCL 8 MG PO TABS: 8.0000 mg | ORAL_TABLET | Freq: Two times a day (BID) | ORAL | Status: DC

## 2014-06-07 MED ORDER — DRONABINOL 5 MG PO CAPS: 5.0000 mg | ORAL_CAPSULE | Freq: Two times a day (BID) | ORAL | Status: DC

## 2014-06-07 MED ORDER — PROCHLORPERAZINE MALEATE 10 MG PO TABS: 10.0000 mg | ORAL_TABLET | Freq: Four times a day (QID) | ORAL | Status: DC | PRN

## 2014-06-07 MED ORDER — TRAZODONE HCL 100 MG PO TABS: 100.0000 mg | ORAL_TABLET | Freq: Every day | ORAL | Status: DC

## 2014-06-14 ENCOUNTER — Encounter: Payer: Self-pay | Admitting: Hematology & Oncology

## 2014-06-14 ENCOUNTER — Ambulatory Visit (HOSPITAL_BASED_OUTPATIENT_CLINIC_OR_DEPARTMENT_OTHER): Payer: No Typology Code available for payment source | Admitting: Hematology & Oncology

## 2014-06-14 ENCOUNTER — Ambulatory Visit (HOSPITAL_BASED_OUTPATIENT_CLINIC_OR_DEPARTMENT_OTHER): Payer: No Typology Code available for payment source

## 2014-06-14 ENCOUNTER — Other Ambulatory Visit (HOSPITAL_BASED_OUTPATIENT_CLINIC_OR_DEPARTMENT_OTHER): Payer: No Typology Code available for payment source | Admitting: Lab

## 2014-06-14 VITALS — BP 138/76 | HR 82 | Temp 98.1°F | Resp 18 | Ht 67.0 in | Wt 144.0 lb

## 2014-06-14 DIAGNOSIS — Z5111 Encounter for antineoplastic chemotherapy: Secondary | ICD-10-CM

## 2014-06-14 DIAGNOSIS — C469 Kaposi's sarcoma, unspecified: Secondary | ICD-10-CM

## 2014-06-14 LAB — CBC WITH DIFFERENTIAL (CANCER CENTER ONLY)
BASO#: 0 10*3/uL (ref 0.0–0.2)
BASO%: 0.6 % (ref 0.0–2.0)
EOS ABS: 0.3 10*3/uL (ref 0.0–0.5)
EOS%: 5.1 % (ref 0.0–7.0)
HCT: 39.9 % (ref 38.7–49.9)
HGB: 13.5 g/dL (ref 13.0–17.1)
LYMPH#: 1.6 10*3/uL (ref 0.9–3.3)
LYMPH%: 25.3 % (ref 14.0–48.0)
MCH: 32 pg (ref 28.0–33.4)
MCHC: 33.8 g/dL (ref 32.0–35.9)
MCV: 95 fL (ref 82–98)
MONO#: 0.5 10*3/uL (ref 0.1–0.9)
MONO%: 8 % (ref 0.0–13.0)
NEUT#: 3.8 10*3/uL (ref 1.5–6.5)
NEUT%: 61 % (ref 40.0–80.0)
Platelets: 386 10*3/uL (ref 145–400)
RBC: 4.22 10*6/uL (ref 4.20–5.70)
RDW: 12.7 % (ref 11.1–15.7)
WBC: 6.3 10*3/uL (ref 4.0–10.0)

## 2014-06-14 LAB — CMP (CANCER CENTER ONLY)
ALBUMIN: 3.7 g/dL (ref 3.3–5.5)
ALT(SGPT): 8 U/L — ABNORMAL LOW (ref 10–47)
AST: 19 U/L (ref 11–38)
Alkaline Phosphatase: 71 U/L (ref 26–84)
BILIRUBIN TOTAL: 0.4 mg/dL (ref 0.20–1.60)
BUN: 8 mg/dL (ref 7–22)
CO2: 28 mEq/L (ref 18–33)
Calcium: 9.1 mg/dL (ref 8.0–10.3)
Chloride: 103 mEq/L (ref 98–108)
Creat: 1.2 mg/dl (ref 0.6–1.2)
Glucose, Bld: 96 mg/dL (ref 73–118)
POTASSIUM: 3.6 meq/L (ref 3.3–4.7)
SODIUM: 143 meq/L (ref 128–145)
TOTAL PROTEIN: 7.1 g/dL (ref 6.4–8.1)

## 2014-06-14 LAB — LACTATE DEHYDROGENASE: LDH: 179 U/L (ref 94–250)

## 2014-06-14 MED ORDER — SODIUM CHLORIDE 0.9 % IV SOLN
Freq: Once | INTRAVENOUS | Status: AC
  Administered 2014-06-14: 10:00:00 via INTRAVENOUS

## 2014-06-14 MED ORDER — ONDANSETRON 8 MG/50ML IVPB (CHCC)
8.0000 mg | Freq: Once | INTRAVENOUS | Status: AC
  Administered 2014-06-14: 8 mg via INTRAVENOUS

## 2014-06-14 MED ORDER — DEXAMETHASONE SODIUM PHOSPHATE 10 MG/ML IJ SOLN
10.0000 mg | Freq: Once | INTRAMUSCULAR | Status: AC
  Administered 2014-06-14: 10 mg via INTRAVENOUS

## 2014-06-14 MED ORDER — DEXTROSE 5 % IV SOLN
20.0000 mg/m2 | Freq: Once | INTRAVENOUS | Status: AC
  Administered 2014-06-14: 36 mg via INTRAVENOUS
  Filled 2014-06-14: qty 18

## 2014-06-14 MED ORDER — DEXAMETHASONE SODIUM PHOSPHATE 10 MG/ML IJ SOLN
INTRAMUSCULAR | Status: AC
  Filled 2014-06-14: qty 1

## 2014-06-14 NOTE — Patient Instructions (Signed)
[facility] Discharge Instructions for Patients Receiving Chemotherapy  Today you received the following chemotherapy agents Doxil  To help prevent nausea and vomiting after your treatment, we encourage you to take your nausea medication    If you develop nausea and vomiting that is not controlled by your nausea medication, call the clinic.   BELOW ARE SYMPTOMS THAT SHOULD BE REPORTED IMMEDIATELY:  *FEVER GREATER THAN 100.5 F  *CHILLS WITH OR WITHOUT FEVER  NAUSEA AND VOMITING THAT IS NOT CONTROLLED WITH YOUR NAUSEA MEDICATION  *UNUSUAL SHORTNESS OF BREATH  *UNUSUAL BRUISING OR BLEEDING  TENDERNESS IN MOUTH AND THROAT WITH OR WITHOUT PRESENCE OF ULCERS  *URINARY PROBLEMS  *BOWEL PROBLEMS  UNUSUAL RASH Items with * indicate a potential emergency and should be followed up as soon as possible.  Feel free to call the clinic you have any questions or concerns. The clinic phone number is [phone].

## 2014-06-14 NOTE — Progress Notes (Signed)
Hematology and Oncology Follow Up Visit  Todd Vincent [id number] May 22, 1975 39 y.o. 06/14/2014   Principle Diagnosis:   Kaposi's sarcoma - progressive  HIV-asymptomatic  Current Therapy:    Doxil- q two-week dosing     Interim History:  Mr.  Todd Vincent is back for followup. He is doing well with treatment. So far, he certainly had no comp occasions.  We did do an echocardiogram on him. This was done 2 weeks ago. His ejection fraction was 45-50%. Previously, it been 50-55%. We will have to watch this closely.  He's had no cough. He's had no shortness of breath. He's had no leg swelling. In fact, his legs are less swollen.  He's had no fever. He's had no nausea or vomiting. There's been no change in bowel or bladder habits.  He is on Marinol for his appetite. He feels that is helping him quite a bit.  His overall performance status is ECOG 1.  Medications:  Current outpatient prescriptions:  .  acyclovir (ZOVIRAX) 800 MG tablet, Take 800 mg by mouth 5 (five) times daily., Disp: , Rfl:  .  Cholecalciferol (VITAMIN D3) 2000 UNITS TABS, Take 2,000 Units by mouth every morning. 2 tabs = 4,000 U daily, Disp: , Rfl:  .  citalopram (CELEXA) 10 MG tablet, TAKE 1 TABLET BY MOUTH EVERY DAY, Disp: 30 tablet, Rfl: 5 .  COCONUT OIL PO, Take 1,000 mg by mouth 2 (two) times daily., Disp: , Rfl:  .  dapsone 100 MG tablet, Take 1 tablet (100 mg total) by mouth daily., Disp: 30 tablet, Rfl: 5 .  dronabinol (MARINOL) 5 MG capsule, Take 1 capsule (5 mg total) by mouth 2 (two) times daily before a meal., Disp: 60 capsule, Rfl: 3 .  gabapentin (NEURONTIN) 300 MG capsule, Take 1 capsule (300 mg total) by mouth 3 (three) times daily., Disp: 90 capsule, Rfl: 0 .  ISENTRESS 400 MG tablet, TAKE 1 TABLET BY MOUTH TWICE DAILY, Disp: 60 tablet, Rfl: 5 .  LORazepam (ATIVAN) 0.5 MG tablet, Take 1 tablet (0.5 mg total) by mouth every 6 (six) hours as needed (Nausea or vomiting)., Disp: 30 tablet, Rfl: 2 .  NORVIR  100 MG TABS tablet, TAKE 1 TABLET BY MOUTH EVERY DAY AFTER BREAKFAST, Disp: 30 tablet, Rfl: 5 .  ondansetron (ZOFRAN) 8 MG tablet, Take 1 tablet (8 mg total) by mouth 2 (two) times daily. Start the day after chemo for 2 days. Then take as needed for nausea or vomiting., Disp: 30 tablet, Rfl: 6 .  PREZISTA 800 MG tablet, TAKE 1 TABLET BY MOUTH EVERY DAY WITH BREAKFAST, Disp: 30 tablet, Rfl: 5 .  prochlorperazine (COMPAZINE) 10 MG tablet, Take 1 tablet (10 mg total) by mouth every 6 (six) hours as needed for nausea or vomiting., Disp: 30 tablet, Rfl: 6 .  traZODone (DESYREL) 100 MG tablet, Take 1 tablet (100 mg total) by mouth at bedtime., Disp: 30 tablet, Rfl: 6 .  triamterene-hydrochlorothiazide (MAXZIDE-25) 37.5-25 MG per tablet, Take 1 tablet daily if needed for leg swelling, Disp: 30 tablet, Rfl: 6 .  TRUVADA 200-300 MG per tablet, TAKE 1 TABLET BY MOUTH EVERY DAY, Disp: 30 tablet, Rfl: 5  Allergies:  Allergies  Allergen Reactions  . Sulfa Antibiotics Rash    Extreme itching     Past Medical History, Surgical history, Social history, and Family History were reviewed and updated.  Review of Systems: As above  Physical Exam:  height is 5\' 7"  (1.702 m) and weight is 144  lb (65.318 kg). His oral temperature is 98.1 F (36.7 C). His blood pressure is 138/76 and his pulse is 82. His respiration is 18.   Well-developed and well-nourished Afro-American male. Head and neck exam shows no ocular or oral lesions. He has no palpable cervical or supraclavicular lymph nodes. Lungs are clear. Cardiac exam regular rate and rhythm with no murmurs rubs or bruits. Abdomen is soft. Has good bowel sounds. There is no fluid wave. There is no palpable liver or spleen tip. Neck exam shows no tenderness over the spine ribs or hips. Extremities shows some 1+ nonpitting edema in his legs. His good range motion of his joints. Has good strength in extremities. Skin exam does show the Kaposi's lesions. They are mostly  macular in nature. They appear less prominent. They are a little bit lighter in color. They are flatter. Neurological exam is non-focal.  Lab Results  Component Value Date   WBC 6.3 06/14/2014   HGB 13.5 06/14/2014   HCT 39.9 06/14/2014   MCV 95 06/14/2014   PLT 386 06/14/2014     Chemistry      Component Value Date/Time   NA 140 05/31/2014 0827   NA 140 04/20/2014 1006   K 3.8 05/31/2014 0827   K 4.5 04/20/2014 1006   CL 96* 05/31/2014 0827   CL 102 04/20/2014 1006   CO2 29 05/31/2014 0827   CO2 26 04/20/2014 1006   BUN 8 05/31/2014 0827   BUN 13 04/20/2014 1006   CREATININE 1.0 05/31/2014 0827   CREATININE 1.09 12/20/2013 0759      Component Value Date/Time   CALCIUM 9.4 05/31/2014 0827   CALCIUM 9.8 04/20/2014 1006   ALKPHOS 82 05/31/2014 0827   ALKPHOS 85 04/20/2014 1006   AST 17 05/31/2014 0827   AST 14 04/20/2014 1006   ALT 12 05/31/2014 0827   ALT 13 04/20/2014 1006   BILITOT 0.70 05/31/2014 0827   BILITOT 0.3 04/20/2014 1006         Impression and Plan: [name] is 39 year old African American gentleman.  He is on Doxil. He is doing well. He is responding.  At this point, I think we'll move his treatments every 3 weeks. I think this be very reasonable.  I probably would recheck an echocardiogram on him in March or April. If we find any change in the ejection fraction, then we'll have to hold the Doxil.     [name], MD 1/28/20168:38 AM

## 2014-06-25 DIAGNOSIS — K602 Anal fissure, unspecified: Secondary | ICD-10-CM | POA: Insufficient documentation

## 2014-06-27 ENCOUNTER — Ambulatory Visit: Payer: No Typology Code available for payment source

## 2014-06-27 ENCOUNTER — Telehealth: Payer: Self-pay | Admitting: Hematology & Oncology

## 2014-06-27 NOTE — Telephone Encounter (Signed)
Patient called and cx 06/28/14 apt due to being sick.  He stated he would call back to resch once he feels better.  Rn was notified of cx apt

## 2014-06-28 ENCOUNTER — Ambulatory Visit: Payer: Self-pay | Admitting: Hematology & Oncology

## 2014-06-28 ENCOUNTER — Other Ambulatory Visit: Payer: Self-pay | Admitting: Lab

## 2014-06-28 ENCOUNTER — Ambulatory Visit: Payer: Self-pay

## 2014-07-16 ENCOUNTER — Other Ambulatory Visit: Payer: Self-pay | Admitting: *Deleted

## 2014-07-16 DIAGNOSIS — B20 Human immunodeficiency virus [HIV] disease: Secondary | ICD-10-CM

## 2014-07-16 MED ORDER — EMTRICITABINE-TENOFOVIR DF 200-300 MG PO TABS: 1.0000 | ORAL_TABLET | Freq: Every day | ORAL | Status: DC

## 2014-07-16 MED ORDER — DARUNAVIR ETHANOLATE 800 MG PO TABS: ORAL_TABLET | ORAL | Status: DC

## 2014-07-16 MED ORDER — RITONAVIR 100 MG PO TABS: 100.0000 mg | ORAL_TABLET | Freq: Every day | ORAL | Status: DC

## 2014-07-16 MED ORDER — RALTEGRAVIR POTASSIUM 400 MG PO TABS: 400.0000 mg | ORAL_TABLET | Freq: Two times a day (BID) | ORAL | Status: DC

## 2014-07-17 ENCOUNTER — Telehealth: Payer: Self-pay | Admitting: Hematology & Oncology

## 2014-07-17 NOTE — Telephone Encounter (Signed)
Faxed Medical Records va fax today to:[facility] [place]   [place] [address] Ph: [phone] Fx: [phone]   CASE: [id number]  Medical Records requested from 05/18/2013 to present     [place] SCANNED

## 2014-08-09 ENCOUNTER — Other Ambulatory Visit: Payer: Self-pay | Admitting: Family

## 2014-08-22 ENCOUNTER — Other Ambulatory Visit: Payer: Medicaid Other

## 2014-08-22 DIAGNOSIS — B20 Human immunodeficiency virus [HIV] disease: Secondary | ICD-10-CM

## 2014-08-23 LAB — HIV-1 RNA QUANT-NO REFLEX-BLD: HIV-1 RNA Quant, Log: <1.3 {Log} (ref <1.30)

## 2014-08-23 LAB — T-HELPER CELL (CD4) - (RCID CLINIC ONLY)
CD4 % Helper T Cell: 13 % — ABNORMAL LOW (ref 33–55)
CD4 T Cell Abs: 290 /uL — ABNORMAL LOW (ref 400–2700)

## 2014-08-29 DIAGNOSIS — R1110 Vomiting, unspecified: Secondary | ICD-10-CM | POA: Diagnosis not present

## 2014-08-30 ENCOUNTER — Other Ambulatory Visit: Payer: Self-pay

## 2014-09-10 ENCOUNTER — Telehealth: Payer: Self-pay | Admitting: Hematology & Oncology

## 2014-09-10 NOTE — Telephone Encounter (Signed)
Pt called made 4-27 appointment

## 2014-09-11 DIAGNOSIS — R109 Unspecified abdominal pain: Secondary | ICD-10-CM | POA: Diagnosis not present

## 2014-09-11 DIAGNOSIS — K56609 Unspecified intestinal obstruction, unspecified as to partial versus complete obstruction: Secondary | ICD-10-CM | POA: Insufficient documentation

## 2014-09-11 DIAGNOSIS — Z932 Ileostomy status: Secondary | ICD-10-CM | POA: Insufficient documentation

## 2014-09-12 ENCOUNTER — Other Ambulatory Visit: Payer: Medicaid Other

## 2014-09-12 ENCOUNTER — Ambulatory Visit: Payer: Medicaid Other | Admitting: Family

## 2014-09-13 ENCOUNTER — Ambulatory Visit: Payer: Self-pay | Admitting: Internal Medicine

## 2014-09-16 DIAGNOSIS — K598 Other specified functional intestinal disorders: Secondary | ICD-10-CM | POA: Diagnosis not present

## 2014-09-20 DIAGNOSIS — K639 Disease of intestine, unspecified: Secondary | ICD-10-CM | POA: Diagnosis not present

## 2014-09-26 ENCOUNTER — Ambulatory Visit (HOSPITAL_BASED_OUTPATIENT_CLINIC_OR_DEPARTMENT_OTHER): Payer: No Typology Code available for payment source | Admitting: Family

## 2014-09-26 ENCOUNTER — Telehealth: Payer: Self-pay | Admitting: Hematology & Oncology

## 2014-09-26 ENCOUNTER — Other Ambulatory Visit (HOSPITAL_BASED_OUTPATIENT_CLINIC_OR_DEPARTMENT_OTHER): Payer: No Typology Code available for payment source

## 2014-09-26 VITALS — BP 114/68 | HR 99 | Temp 98.1°F | Resp 16 | Wt 134.0 lb

## 2014-09-26 DIAGNOSIS — C469 Kaposi's sarcoma, unspecified: Secondary | ICD-10-CM

## 2014-09-26 DIAGNOSIS — B20 Human immunodeficiency virus [HIV] disease: Secondary | ICD-10-CM

## 2014-09-26 LAB — COMPREHENSIVE METABOLIC PANEL (CC13)
ALBUMIN: 3.1 g/dL — AB (ref 3.5–5.0)
ALT: 30 U/L (ref 0–55)
AST: 21 U/L (ref 5–34)
Alkaline Phosphatase: 76 U/L (ref 40–150)
Anion Gap: 7 mEq/L (ref 3–11)
BUN: 7.8 mg/dL (ref 7.0–26.0)
CALCIUM: 8.9 mg/dL (ref 8.4–10.4)
CHLORIDE: 107 meq/L (ref 98–109)
CO2: 26 mEq/L (ref 22–29)
CREATININE: 0.8 mg/dL (ref 0.7–1.3)
EGFR: >90 mL/min/{1.73_m2} (ref >90)
Glucose: 121 mg/dl (ref 70–140)
Potassium: 4.1 mEq/L (ref 3.5–5.1)
Sodium: 140 mEq/L (ref 136–145)
Total Protein: 7.1 g/dL (ref 6.4–8.3)

## 2014-09-26 LAB — CBC WITH DIFFERENTIAL (CANCER CENTER ONLY)
BASO#: 0 10*3/uL (ref 0.0–0.2)
BASO%: 0.3 % (ref 0.0–2.0)
EOS%: 3.4 % (ref 0.0–7.0)
Eosinophils Absolute: 0.2 10*3/uL (ref 0.0–0.5)
HCT: 35.9 % — ABNORMAL LOW (ref 38.7–49.9)
HGB: 12.2 g/dL — ABNORMAL LOW (ref 13.0–17.1)
LYMPH#: 3.3 10*3/uL (ref 0.9–3.3)
LYMPH%: 48.8 % — ABNORMAL HIGH (ref 14.0–48.0)
MCH: 30.5 pg (ref 28.0–33.4)
MCHC: 34 g/dL (ref 32.0–35.9)
MCV: 90 fL (ref 82–98)
MONO#: 0.7 10*3/uL (ref 0.1–0.9)
MONO%: 9.9 % (ref 0.0–13.0)
NEUT%: 37.6 % — AB (ref 40.0–80.0)
NEUTROS ABS: 2.5 10*3/uL (ref 1.5–6.5)
Platelets: 514 10*3/uL — ABNORMAL HIGH (ref 145–400)
RBC: 4 10*6/uL — ABNORMAL LOW (ref 4.20–5.70)
RDW: 14.1 % (ref 11.1–15.7)
WBC: 6.8 10*3/uL (ref 4.0–10.0)

## 2014-09-26 LAB — TECHNOLOGIST REVIEW CHCC SATELLITE

## 2014-09-26 NOTE — Telephone Encounter (Signed)
Per order to sch Echo next week.  Spoke with Butch Penny and she sch appt for 10/05/14 at [facility]

## 2014-09-26 NOTE — Progress Notes (Signed)
Hematology and Oncology Follow Up Visit  Todd Vincent [id number] 06-06-1975 39 y.o. 09/26/2014   Principle Diagnosis:  Kaposi's sarcoma - progressive HIV-asymptomatic  Current Therapy:   Observation    Interim History:  Mr. Docken is here today for a follow-up. 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 He is on a restricted diet. No red meat, pork, raw fish or raw vegetables. He is eating mostly fish and chicken and supplementing with Ensure. He is down 10 lbs this visit. He states that he feels good and that he is taking Marinol to help with his appetite. He is staying hydrated.  His sarcoma lesions have healed and he has some hyperpigmented areas that are fading. He is asymptomatic with his HIV at this time.  He denies fever, chills, n/v, cough, rash, dizziness, SOB, chest pain, palpitations, abdominal pain, blood in urine or stool. He had one episode in May where his ileostomy stopped putting out. This was resolved with NG tube and irrigation or the stoma.  The swelling in his legs has resolved. He has no tenderness, numbness or tingling in his extremities.  His Echo in January showed an EF of 45-50%. We will repeat this test within the next week.   Medications:    Medication List       This list is accurate as of: 09/26/14 10:37 AM.  Always use your most recent med list.               acyclovir 800 MG tablet  Commonly known as:  ZOVIRAX  Take 800 mg by mouth 5 (five) times daily.     citalopram 10 MG tablet  Commonly known as:  CELEXA  TAKE 1 TABLET BY MOUTH EVERY DAY     COCONUT OIL PO  Take 1,000 mg by mouth 2 (two) times daily.     dapsone 100 MG tablet  Take 1 tablet (100 mg total) by mouth daily.     Darunavir Ethanolate 800 MG tablet  Commonly known as:  PREZISTA  TAKE 1 TABLET BY MOUTH EVERY DAY WITH BREAKFAST     dronabinol 5 MG capsule  Commonly known as:  MARINOL  Take 1 capsule (5 mg  total) by mouth 2 (two) times daily before a meal.     emtricitabine-tenofovir 200-300 MG per tablet  Commonly known as:  TRUVADA  Take 1 tablet by mouth daily.     gabapentin 300 MG capsule  Commonly known as:  NEURONTIN  Take 1 capsule (300 mg total) by mouth 3 (three) times daily.     LORazepam 0.5 MG tablet  Commonly known as:  ATIVAN  Take 1 tablet (0.5 mg total) by mouth every 6 (six) hours as needed (Nausea or vomiting).     ondansetron 8 MG tablet  Commonly known as:  ZOFRAN  Take 1 tablet (8 mg total) by mouth 2 (two) times daily. Start the day after chemo for 2 days. Then take as needed for nausea or vomiting.     prochlorperazine 10 MG tablet  Commonly known as:  COMPAZINE  Take 1 tablet (10 mg total) by mouth every 6 (six) hours as needed for nausea or vomiting.     raltegravir 400 MG tablet  Commonly known as:  ISENTRESS  Take 1 tablet (400 mg total) by mouth 2 (two) times daily.     ritonavir 100 MG Tabs tablet  Commonly known as:  NORVIR  Take 1 tablet (100 mg total) by mouth daily with breakfast.     traZODone 100 MG tablet  Commonly known as:  DESYREL  Take 1 tablet (100 mg total) by mouth at bedtime.     triamterene-hydrochlorothiazide 37.5-25 MG per tablet  Commonly known as:  MAXZIDE-25  Take 1 tablet daily if needed for leg swelling     Vitamin D3 2000 UNITS Tabs  Take 2,000 Units by mouth every morning. 2 tabs = 4,000 U daily        Allergies:  Allergies  Allergen Reactions  . Sulfa Antibiotics Rash    Extreme itching     Past Medical History, Surgical history, Social history, and Family History were reviewed and updated.  Review of Systems: All other 10 point review of systems is negative.   Physical Exam:  vitals were not taken for this visit.  Wt Readings from Last 3 Encounters:  06/14/14 144 lb (65.318 kg)  05/31/14 146 lb (66.225 kg)  05/17/14 152 lb (68.947 kg)    Ocular: Sclerae unicteric, pupils equal, round and reactive  to light Ear-nose-throat: Oropharynx clear, dentition fair Lymphatic: No cervical or supraclavicular adenopathy Lungs no rales or rhonchi, good excursion bilaterally Heart regular rate and rhythm, no murmur appreciated Abd soft, nontender, positive bowel sounds MSK no focal spinal tenderness, no joint edema Neuro: non-focal, well-oriented, appropriate affect  Lab Results  Component Value Date   WBC 6.3 06/14/2014   HGB 13.5 06/14/2014   HCT 39.9 06/14/2014   MCV 95 06/14/2014   PLT 386 06/14/2014   No results found for: FERRITIN, IRON, TIBC, UIBC, IRONPCTSAT Lab Results  Component Value Date   RBC 4.22 06/14/2014   No results found for: KPAFRELGTCHN, LAMBDASER, KAPLAMBRATIO No results found for: Kandis Cocking, IGMSERUM No results found for: Odetta Pink, SPEI   Chemistry      Component Value Date/Time   NA 143 06/14/2014 0754   NA 140 04/20/2014 1006   K 3.6 06/14/2014 0754   K 4.5 04/20/2014 1006   CL 103 06/14/2014 0754   CL 102 04/20/2014 1006   CO2 28 06/14/2014 0754   CO2 26 04/20/2014 1006   BUN 8 06/14/2014 0754   BUN 13 04/20/2014 1006   CREATININE 1.2 06/14/2014 0754   CREATININE 1.09 12/20/2013 0759      Component Value Date/Time   CALCIUM 9.1 06/14/2014 0754   CALCIUM 9.8 04/20/2014 1006   ALKPHOS 71 06/14/2014 0754   ALKPHOS 85 04/20/2014 1006   AST 19 06/14/2014 0754   AST 14 04/20/2014 1006   ALT 8* 06/14/2014 0754   ALT 13 04/20/2014 1006   BILITOT 0.40 06/14/2014 0754   BILITOT 0.3 04/20/2014 1006     Impression and Plan: Mr. Rackley is 39 year old African American gentleman with Kaposi's sarcoma. His last dose of Doxil was in January. His sarcoma lesions have resolved and her appears to be doing much better.  He is adjusting now to his ileostomy. This was placed with the hopes of giving the perirectal abscess and fistula time to heal.  He is increasing his calorie intake and supplementing some  with Ensure. He will have a repeat Echocardiogram this week to evaluate his EF.  We will plan to see him back in 3 months for labs and follow-up.  He will contact us with any questions or concerns. We can certainly see him sooner if need be.   Eliezer Bottom, NP 5/11/201610:37 AM

## 2014-10-05 ENCOUNTER — Ambulatory Visit (HOSPITAL_COMMUNITY)
Admission: RE | Admit: 2014-10-05 | Discharge: 2014-10-05 | Disposition: A | Discharge status: Home / Self Care | Payer: No Typology Code available for payment source | Source: Ambulatory Visit | Attending: Family | Admitting: Family

## 2014-10-05 DIAGNOSIS — Z87891 Personal history of nicotine dependence: Secondary | ICD-10-CM | POA: Insufficient documentation

## 2014-10-05 DIAGNOSIS — Z01818 Encounter for other preprocedural examination: Secondary | ICD-10-CM | POA: Insufficient documentation

## 2014-10-05 DIAGNOSIS — C469 Kaposi's sarcoma, unspecified: Secondary | ICD-10-CM

## 2014-10-05 NOTE — Progress Notes (Signed)
  Echocardiogram 2D Echocardiogram has been performed.  Todd Vincent FRANCES 10/05/2014, 1:43 PM

## 2014-10-17 ENCOUNTER — Ambulatory Visit: Payer: No Typology Code available for payment source

## 2014-10-17 ENCOUNTER — Telehealth: Payer: Self-pay | Admitting: Hematology & Oncology

## 2014-10-17 NOTE — Progress Notes (Unsigned)
No treatment required per m.d order

## 2014-10-17 NOTE — Telephone Encounter (Signed)
Faxed medical records via fax today to: [facility] Ph: [phone]  Fx: [phone]   Medical Records requested from 08/31/2013 to present    COPY SCANNED

## 2014-10-18 ENCOUNTER — Encounter: Payer: Self-pay | Admitting: Internal Medicine

## 2014-10-18 ENCOUNTER — Ambulatory Visit (INDEPENDENT_AMBULATORY_CARE_PROVIDER_SITE_OTHER): Payer: Self-pay | Admitting: Internal Medicine

## 2014-10-18 VITALS — BP 102/67 | HR 117 | Temp 98.9°F | Ht 67.0 in | Wt 132.0 lb

## 2014-10-18 DIAGNOSIS — B20 Human immunodeficiency virus [HIV] disease: Secondary | ICD-10-CM

## 2014-10-18 MED ORDER — DARUNAVIR ETHANOLATE 800 MG PO TABS: ORAL_TABLET | ORAL | Status: DC

## 2014-10-18 MED ORDER — ELVITEG-COBIC-EMTRICIT-TENOFAF 150-150-200-10 MG PO TABS: 1.0000 | ORAL_TABLET | Freq: Every day | ORAL | Status: DC

## 2014-10-18 NOTE — Progress Notes (Signed)
  Subjective:    Patient ID: Todd Vincent, male    DOB: [birth year], 39 y.o.   MRN: [id number]  HPI He comes in for routine follow up  He continues on Isentress and truvada, prezista, norvir and denies any missed doses.  He has been on doxil for Kaposi's sarcoma which she restarted in September, last dose in January, but told yesterday he will no longer need it.   His CD4 is 290.  No new issues.  No weight loss, trying to gain weight.  no diarrhea. No new complaints.    Review of Systems  Constitutional: Negative for fever and chills.  HENT: Negative for sore throat and trouble swallowing.   Eyes: Negative for visual disturbance.  Respiratory: Negative for cough and shortness of breath.   Cardiovascular: Negative for chest pain.  Gastrointestinal: Negative for nausea and diarrhea.  Musculoskeletal: Negative for myalgias and arthralgias.  Skin: Negative for rash.  Neurological: Negative for dizziness, light-headedness and headaches.  Hematological: Negative for adenopathy.  Psychiatric/Behavioral: Negative for dysphoric mood.       Objective:   Physical Exam  Constitutional: He appears well-developed and well-nourished. No distress.  HENT:  Mouth/Throat: No oropharyngeal exudate.  Eyes: No scleral icterus.  Cardiovascular: Normal rate, regular rhythm and normal heart sounds.   No murmur heard. Pulmonary/Chest: Effort normal and breath sounds normal. No respiratory distress.  Lymphadenopathy:    He has no cervical adenopathy.  Skin:             Assessment & Plan:

## 2014-10-18 NOTE — Assessment & Plan Note (Signed)
I talked to him about streamlining his regimen since he has done so well with his current regimen. I will change him to [facility] with Prezista to take once a day. He can then stop Isentress, Norvir and Truvada. I will have him return in 2 months for labs and follow-up

## 2014-10-22 ENCOUNTER — Ambulatory Visit: Payer: Medicaid Other | Admitting: Internal Medicine

## 2014-11-07 ENCOUNTER — Ambulatory Visit: Payer: No Typology Code available for payment source

## 2014-11-23 ENCOUNTER — Ambulatory Visit (HOSPITAL_BASED_OUTPATIENT_CLINIC_OR_DEPARTMENT_OTHER): Payer: No Typology Code available for payment source | Admitting: Hematology & Oncology

## 2014-11-23 ENCOUNTER — Encounter: Payer: Self-pay | Admitting: Hematology & Oncology

## 2014-11-23 ENCOUNTER — Other Ambulatory Visit (HOSPITAL_BASED_OUTPATIENT_CLINIC_OR_DEPARTMENT_OTHER): Payer: No Typology Code available for payment source

## 2014-11-23 VITALS — BP 127/88 | HR 104 | Temp 97.6°F | Resp 18 | Ht 67.0 in | Wt 138.0 lb

## 2014-11-23 DIAGNOSIS — M792 Neuralgia and neuritis, unspecified: Secondary | ICD-10-CM

## 2014-11-23 DIAGNOSIS — G629 Polyneuropathy, unspecified: Secondary | ICD-10-CM

## 2014-11-23 DIAGNOSIS — C469 Kaposi's sarcoma, unspecified: Secondary | ICD-10-CM

## 2014-11-23 DIAGNOSIS — B20 Human immunodeficiency virus [HIV] disease: Secondary | ICD-10-CM

## 2014-11-23 DIAGNOSIS — R609 Edema, unspecified: Secondary | ICD-10-CM

## 2014-11-23 LAB — COMPREHENSIVE METABOLIC PANEL
ALBUMIN: 3.9 g/dL (ref 3.5–5.2)
ALT: <8 U/L (ref 0–53)
AST: 10 U/L (ref 0–37)
Alkaline Phosphatase: 67 U/L (ref 39–117)
BUN: 11 mg/dL (ref 6–23)
CHLORIDE: 99 meq/L (ref 96–112)
CO2: 26 mEq/L (ref 19–32)
CREATININE: 1.02 mg/dL (ref 0.50–1.35)
Calcium: 9.7 mg/dL (ref 8.4–10.5)
Glucose, Bld: 116 mg/dL — ABNORMAL HIGH (ref 70–99)
Potassium: 4.1 mEq/L (ref 3.5–5.3)
Sodium: 136 mEq/L (ref 135–145)
TOTAL PROTEIN: 7.8 g/dL (ref 6.0–8.3)
Total Bilirubin: 0.4 mg/dL (ref 0.2–1.2)

## 2014-11-23 LAB — CBC WITH DIFFERENTIAL (CANCER CENTER ONLY)
BASO#: 0 10*3/uL (ref 0.0–0.2)
BASO%: 0.4 % (ref 0.0–2.0)
EOS%: 1.5 % (ref 0.0–7.0)
Eosinophils Absolute: 0.2 10*3/uL (ref 0.0–0.5)
HCT: 40.8 % (ref 38.7–49.9)
HEMOGLOBIN: 14.1 g/dL (ref 13.0–17.1)
LYMPH#: 3.4 10*3/uL — ABNORMAL HIGH (ref 0.9–3.3)
LYMPH%: 30.4 % (ref 14.0–48.0)
MCH: 29.9 pg (ref 28.0–33.4)
MCHC: 34.6 g/dL (ref 32.0–35.9)
MCV: 87 fL (ref 82–98)
MONO#: 0.8 10*3/uL (ref 0.1–0.9)
MONO%: 7 % (ref 0.0–13.0)
NEUT%: 60.7 % (ref 40.0–80.0)
NEUTROS ABS: 6.8 10*3/uL — AB (ref 1.5–6.5)
Platelets: 364 10*3/uL (ref 145–400)
RBC: 4.71 10*6/uL (ref 4.20–5.70)
RDW: 15.9 % — AB (ref 11.1–15.7)
WBC: 11.1 10*3/uL — ABNORMAL HIGH (ref 4.0–10.0)

## 2014-11-23 MED ORDER — GABAPENTIN 300 MG PO CAPS: 300.0000 mg | ORAL_CAPSULE | Freq: Three times a day (TID) | ORAL | Status: DC

## 2014-11-23 NOTE — Progress Notes (Signed)
Hematology and Oncology Follow Up Visit  Todd Vincent [id number] [birth year] 39 y.o. 11/23/2014   Principle Diagnosis:   Kaposi's sarcoma - progressive  HIV-asymptomatic  Current Therapy:    Doxil- q two-week dosing     Interim History:  [name] is back for followup. Unfortunately, it looks his Kaposi's has recurred. He has thicker lesions on his arms. He has neuropathy in his feet. He has growing lesions on the bottom of his feet.  He has lost a little weight. He's had no cough. There's been no nausea or vomiting.  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 Thankfully, he can have this reversed in about a year.  He's had no leading. He's had no fever. His appetite has been good area  He's been able to enjoy himself. He has been doing some traveling.  I think the neuropathy is a worsening for him right now. It seems to be worse when he gets up in the morning and puts pressure on his feet.   His overall performance status is ECOG 1.  Medications:  Current outpatient prescriptions:  .  acyclovir (ZOVIRAX) 800 MG tablet, Take 800 mg by mouth 5 (five) times daily., Disp: , Rfl:  .  ascorbic acid (VITAMIN C) 500 MG tablet, Take by mouth., Disp: , Rfl:  .  Cholecalciferol (VITAMIN D3) 2000 UNITS TABS, Take 2,000 Units by mouth every morning. 2 tabs = 4,000 U daily, Disp: , Rfl:  .  citalopram (CELEXA) 10 MG tablet, TAKE 1 TABLET BY MOUTH EVERY DAY, Disp: 30 tablet, Rfl: 5 .  COCONUT OIL PO, Take 1,000 mg by mouth 2 (two) times daily., Disp: , Rfl:  .  Darunavir Ethanolate (PREZISTA) 800 MG tablet, TAKE 1 TABLET BY MOUTH EVERY DAY WITH BREAKFAST, Disp: 30 tablet, Rfl: 5 .  dronabinol (MARINOL) 5 MG capsule, Take 1 capsule (5 mg total) by mouth 2 (two) times daily before a meal., Disp: 60 capsule, Rfl: 3 .  elvitegravir-cobicistat-emtricitabine-tenofovir (GENVOYA) 150-150-200-10 MG TABS tablet, Take 1 tablet by mouth daily., Disp: 30 tablet, Rfl: 5 .   ferrous sulfate 325 (65 FE) MG EC tablet, Take by mouth., Disp: , Rfl:  .  gabapentin (NEURONTIN) 300 MG capsule, Take 1 capsule (300 mg total) by mouth 3 (three) times daily., Disp: 90 capsule, Rfl: 0 .  LORazepam (ATIVAN) 0.5 MG tablet, Take 1 tablet (0.5 mg total) by mouth every 6 (six) hours as needed (Nausea or vomiting)., Disp: 30 tablet, Rfl: 2 .  ondansetron (ZOFRAN) 8 MG tablet, Take 1 tablet (8 mg total) by mouth 2 (two) times daily. Start the day after chemo for 2 days. Then take as needed for nausea or vomiting., Disp: 30 tablet, Rfl: 6 .  polyethylene glycol (MIRALAX / GLYCOLAX) packet, Take by mouth., Disp: , Rfl:  .  traZODone (DESYREL) 100 MG tablet, Take 1 tablet (100 mg total) by mouth at bedtime. (Patient taking differently: Take 100 mg by mouth as needed. ), Disp: 30 tablet, Rfl: 6 .  triamterene-hydrochlorothiazide (MAXZIDE-25) 37.5-25 MG per tablet, Take 1 tablet daily if needed for leg swelling, Disp: 30 tablet, Rfl: 6 .  gabapentin (NEURONTIN) 300 MG capsule, Take 1 capsule (300 mg total) by mouth 3 (three) times daily., Disp: 90 capsule, Rfl: 3  Allergies:  Allergies  Allergen Reactions  . Sulfa Antibiotics Rash    Extreme itching     Past Medical History, Surgical history, Social history, and Family History were reviewed  and updated.  Review of Systems: As above  Physical Exam:  height is 5\' 7"  (1.702 m) and weight is 138 lb (62.596 kg). His oral temperature is 97.6 F (36.4 C). His blood pressure is 127/88 and his pulse is 104. His respiration is 18.   Well-developed and well-nourished Afro-American male. Head and neck exam shows no ocular or oral lesions. He has no palpable cervical or supraclavicular lymph nodes. Lungs are clear. Cardiac exam regular rate and rhythm with no murmurs rubs or bruits. Abdomen is soft. Has good bowel sounds. There is no fluid wave. There is no palpable liver or spleen tip. Neck exam shows no tenderness over the spine ribs or hips.  Extremities shows some 1+ nonpitting edema in his legs. His good range motion of his joints. Has good strength in extremities. Skin exam does show the Kaposi's lesions. He has prominent lesions on the bottoms of both feet. They are mostly thicker in nature. They appear more prominent. They are a little bit darker in color. They are flatter. Neurological exam is non-focal.  Lab Results  Component Value Date   WBC 11.1* 11/23/2014   HGB 14.1 11/23/2014   HCT 40.8 11/23/2014   MCV 87 11/23/2014   PLT 364 11/23/2014     Chemistry      Component Value Date/Time   NA 140 09/26/2014 1032   NA 143 06/14/2014 0754   NA 140 04/20/2014 1006   K 4.1 09/26/2014 1032   K 3.6 06/14/2014 0754   K 4.5 04/20/2014 1006   CL 103 06/14/2014 0754   CL 102 04/20/2014 1006   CO2 26 09/26/2014 1032   CO2 28 06/14/2014 0754   CO2 26 04/20/2014 1006   BUN 7.8 09/26/2014 1032   BUN 8 06/14/2014 0754   BUN 13 04/20/2014 1006   CREATININE 0.8 09/26/2014 1032   CREATININE 1.2 06/14/2014 0754   CREATININE 1.09 12/20/2013 0759      Component Value Date/Time   CALCIUM 8.9 09/26/2014 1032   CALCIUM 9.1 06/14/2014 0754   CALCIUM 9.8 04/20/2014 1006   ALKPHOS 76 09/26/2014 1032   ALKPHOS 71 06/14/2014 0754   ALKPHOS 85 04/20/2014 1006   AST 21 09/26/2014 1032   AST 19 06/14/2014 0754   AST 14 04/20/2014 1006   ALT 30 09/26/2014 1032   ALT 8* 06/14/2014 0754   ALT 13 04/20/2014 1006   BILITOT <0.20 09/26/2014 1032   BILITOT 0.40 06/14/2014 0754   BILITOT 0.3 04/20/2014 1006         Impression and Plan: Todd Vincent is a 39 year old African American gentleman.    It is clear that he will need to go back onto treatment. Doxil has was worked for him. His last ventricular function was fantastic. I don't see problems with getting him back onto Doxil. We may consider giving him Zinecard to help with any kind of cardioprotection.  Again, he responds well to Doxil. We can tell very readily by the skin  lesions as to how well he is responding.  The lesions on the bottoms of his feet clearly other reason for the neuropathy. We will start him on some Neurontin. I'll put him on 300 mg 3 times a day.  I don't think we need any scans on him.  The edema might be from lymph node involvement in the groin area. Again he has had this before and the treatment this responds.  We'll start treatment next week on him. He is well aware of  the toxicity. We will have to monitor his cardiac function probably in 2 months or so.  Assessment about 45 mins with him as this is a new situation that we have to deal with now.    [name], MD 7/8/201611:48 AM

## 2014-11-30 ENCOUNTER — Telehealth: Payer: Self-pay | Admitting: Hematology & Oncology

## 2014-11-30 ENCOUNTER — Other Ambulatory Visit (HOSPITAL_BASED_OUTPATIENT_CLINIC_OR_DEPARTMENT_OTHER): Payer: No Typology Code available for payment source

## 2014-11-30 ENCOUNTER — Ambulatory Visit (HOSPITAL_BASED_OUTPATIENT_CLINIC_OR_DEPARTMENT_OTHER): Payer: No Typology Code available for payment source

## 2014-11-30 VITALS — BP 101/59 | HR 88 | Temp 98.6°F | Resp 16 | Wt 138.0 lb

## 2014-11-30 DIAGNOSIS — C469 Kaposi's sarcoma, unspecified: Secondary | ICD-10-CM

## 2014-11-30 DIAGNOSIS — M792 Neuralgia and neuritis, unspecified: Secondary | ICD-10-CM

## 2014-11-30 DIAGNOSIS — Z5111 Encounter for antineoplastic chemotherapy: Secondary | ICD-10-CM

## 2014-11-30 LAB — CBC WITH DIFFERENTIAL (CANCER CENTER ONLY)
BASO#: 0 10*3/uL (ref 0.0–0.2)
BASO%: 0.2 % (ref 0.0–2.0)
EOS%: 0.9 % (ref 0.0–7.0)
Eosinophils Absolute: 0.1 10*3/uL (ref 0.0–0.5)
HCT: 38.9 % (ref 38.7–49.9)
HGB: 13.4 g/dL (ref 13.0–17.1)
LYMPH#: 2.4 10*3/uL (ref 0.9–3.3)
LYMPH%: 21.8 % (ref 14.0–48.0)
MCH: 29.9 pg (ref 28.0–33.4)
MCHC: 34.4 g/dL (ref 32.0–35.9)
MCV: 87 fL (ref 82–98)
MONO#: 0.7 10*3/uL (ref 0.1–0.9)
MONO%: 6.5 % (ref 0.0–13.0)
NEUT%: 70.6 % (ref 40.0–80.0)
NEUTROS ABS: 7.8 10*3/uL — AB (ref 1.5–6.5)
Platelets: 341 10*3/uL (ref 145–400)
RBC: 4.48 10*6/uL (ref 4.20–5.70)
RDW: 15.9 % — ABNORMAL HIGH (ref 11.1–15.7)
WBC: 11.1 10*3/uL — ABNORMAL HIGH (ref 4.0–10.0)

## 2014-11-30 LAB — CMP (CANCER CENTER ONLY)
ALT(SGPT): 11 U/L (ref 10–47)
AST: 17 U/L (ref 11–38)
Albumin: 3.8 g/dL (ref 3.3–5.5)
Alkaline Phosphatase: 68 U/L (ref 26–84)
BUN, Bld: 8 mg/dL (ref 7–22)
CHLORIDE: 104 meq/L (ref 98–108)
CO2: 26 meq/L (ref 18–33)
CREATININE: 0.8 mg/dL (ref 0.6–1.2)
Calcium: 9.6 mg/dL (ref 8.0–10.3)
Glucose, Bld: 106 mg/dL (ref 73–118)
Potassium: 3.3 mEq/L (ref 3.3–4.7)
Sodium: 141 mEq/L (ref 128–145)
TOTAL PROTEIN: 8.2 g/dL — AB (ref 6.4–8.1)
Total Bilirubin: 0.8 mg/dl (ref 0.20–1.60)

## 2014-11-30 LAB — LACTATE DEHYDROGENASE: LDH: 152 U/L (ref 94–250)

## 2014-11-30 MED ORDER — SODIUM CHLORIDE 0.9 % IV SOLN
Freq: Once | INTRAVENOUS | Status: AC
  Administered 2014-11-30: 12:00:00 via INTRAVENOUS

## 2014-11-30 MED ORDER — DEXTROSE 5 % IV SOLN
20.0000 mg/m2 | Freq: Once | INTRAVENOUS | Status: AC
  Administered 2014-11-30: 36 mg via INTRAVENOUS
  Filled 2014-11-30: qty 18

## 2014-11-30 MED ORDER — ONDANSETRON 8 MG/50ML IVPB (CHCC): 8.0000 mg | Freq: Once | INTRAVENOUS | Status: DC

## 2014-11-30 MED ORDER — SODIUM CHLORIDE 0.9 % IV SOLN
Freq: Once | INTRAVENOUS | Status: AC
  Administered 2014-11-30: 12:00:00 via INTRAVENOUS
  Filled 2014-11-30: qty 4

## 2014-11-30 MED ORDER — DEXAMETHASONE SODIUM PHOSPHATE 10 MG/ML IJ SOLN: 10.0000 mg | Freq: Once | INTRAMUSCULAR | Status: DC

## 2014-11-30 NOTE — Patient Instructions (Signed)
Doxorubicin Liposomal injection What is this medicine? LIPOSOMAL DOXORUBICIN (LIP oh som al dox oh ROO bi sin) is a chemotherapy drug. This medicine is used to treat many kinds of cancer like Kaposi's sarcoma, multiple myeloma, and ovarian cancer. This medicine may be used for other purposes; ask your health care provider or pharmacist if you have questions. COMMON BRAND NAME(S): Doxil, Lipodox What should I tell my health care provider before I take this medicine? They need to know if you have any of these conditions: -blood disorders -heart disease -infection (especially a virus infection such as chickenpox, cold sores, or herpes) -liver disease -recent or ongoing radiation therapy -an unusual or allergic reaction to doxorubicin, other chemotherapy agents, soybeans, other medicines, foods, dyes, or preservatives -pregnant or trying to get pregnant -breast-feeding How should I use this medicine? This drug is given as an infusion into a vein. It is administered in a hospital or clinic by a specially trained health care professional. If you have pain, swelling, burning or any unusual feeling around the site of your injection, tell your health care professional right away. Talk to your pediatrician regarding the use of this medicine in children. Special care may be needed. Overdosage: If you think you have taken too much of this medicine contact a poison control center or emergency room at once. NOTE: This medicine is only for you. Do not share this medicine with others. What if I miss a dose? It is important not to miss your dose. Call your doctor or health care professional if you are unable to keep an appointment. What may interact with this medicine? Do not take this medicine with any of the following medications: -zidovudine This medicine may also interact with the following medications: -medicines to increase blood counts like filgrastim, pegfilgrastim, sargramostim -vaccines Talk to  your doctor or health care professional before taking any of these medicines: -acetaminophen -aspirin -ibuprofen -ketoprofen -naproxen This list may not describe all possible interactions. Give your health care provider a list of all the medicines, herbs, non-prescription drugs, or dietary supplements you use. Also tell them if you smoke, drink alcohol, or use illegal drugs. Some items may interact with your medicine. What should I watch for while using this medicine? Your condition will be monitored carefully while you are receiving this medicine. You will need important blood work done while you are taking this medicine. This drug may make you feel generally unwell. This is not uncommon, as chemotherapy can affect healthy cells as well as cancer cells. Report any side effects. Continue your course of treatment even though you feel ill unless your doctor tells you to stop. Your urine may turn orange-red for a few days after your dose. This is not blood. If your urine is dark or brown, call your doctor. In some cases, you may be given additional medicines to help with side effects. Follow all directions for their use. Call your doctor or health care professional for advice if you get a fever (100.5 degrees F or higher), chills or sore throat, or other symptoms of a cold or flu. Do not treat yourself. This drug decreases your body's ability to fight infections. Try to avoid being around people who are sick. This medicine may increase your risk to bruise or bleed. Call your doctor or health care professional if you notice any unusual bleeding. Be careful brushing and flossing your teeth or using a toothpick because you may get an infection or bleed more easily. If you have any dental  work done, tell your dentist you are receiving this medicine. Avoid taking products that contain aspirin, acetaminophen, ibuprofen, naproxen, or ketoprofen unless instructed by your doctor. These medicines may hide a  fever. Men and women of childbearing age should use effective birth control methods while using taking this medicine. Do not become pregnant while taking this medicine. There is a potential for serious side effects to an unborn child. Talk to your health care professional or pharmacist for more information. Do not breast-feed an infant while taking this medicine. Talk to your doctor about your risk of cancer. You may be more at risk for certain types of cancers if you take this medicine. What side effects may I notice from receiving this medicine? Side effects that you should report to your doctor or health care professional as soon as possible: -allergic reactions like skin rash, itching or hives, swelling of the face, lips, or tongue -low blood counts - this medicine may decrease the number of white blood cells, red blood cells and platelets. You may be at increased risk for infections and bleeding. -signs of hand-foot syndrome - tingling or burning, redness, flaking, swelling, small blisters, or small sores on the palms of your hands or the soles of your feet -signs of infection - fever or chills, cough, sore throat, pain or difficulty passing urine -signs of decreased platelets or bleeding - bruising, pinpoint red spots on the skin, black, tarry stools, blood in the urine -signs of decreased red blood cells - unusually weak or tired, fainting spells, lightheadedness -back pain, chills, facial flushing, fever, headache, tightness in the chest or throat during the infusion -breathing problems -chest pain -fast, irregular heartbeat -mouth pain, redness, sores -pain, swelling, redness at site where injected -pain, tingling, numbness in the hands or feet -swelling of ankles, feet, or hands -vomiting Side effects that usually do not require medical attention (report to your doctor or health care professional if they continue or are bothersome): -diarrhea -hair loss -loss of appetite -nail  discoloration or damage -nausea -red or watery eyes -red colored urine -stomach upset This list may not describe all possible side effects. Call your doctor for medical advice about side effects. You may report side effects to FDA at 1-800-FDA-1088. Where should I keep my medicine? This drug is given in a hospital or clinic and will not be stored at home. NOTE: This sheet is a summary. It may not cover all possible information. If you have questions about this medicine, talk to your doctor, pharmacist, or health care provider.  2015, [employer]vier/Gold Standard. (2012-01-22 10:12:56)  

## 2014-11-30 NOTE — Telephone Encounter (Signed)
ERROR

## 2014-12-14 ENCOUNTER — Encounter: Payer: Self-pay | Admitting: Pharmacist

## 2014-12-18 ENCOUNTER — Other Ambulatory Visit: Payer: No Typology Code available for payment source

## 2014-12-18 ENCOUNTER — Telehealth: Payer: Self-pay | Admitting: Hematology & Oncology

## 2014-12-18 NOTE — Telephone Encounter (Signed)
Faxed medical records via fax today to: [facility] Ph: [phone]  Fx: [phone]   Medical Records requested from 09/27/2014 to present  HEARING DATE: 02/28/2015      COPY SCANNED

## 2014-12-20 ENCOUNTER — Telehealth: Payer: Self-pay | Admitting: Hematology & Oncology

## 2014-12-20 ENCOUNTER — Other Ambulatory Visit: Payer: Self-pay | Admitting: *Deleted

## 2014-12-20 ENCOUNTER — Other Ambulatory Visit: Payer: Medicaid Other

## 2014-12-20 DIAGNOSIS — C469 Kaposi's sarcoma, unspecified: Secondary | ICD-10-CM

## 2014-12-20 DIAGNOSIS — B20 Human immunodeficiency virus [HIV] disease: Secondary | ICD-10-CM

## 2014-12-20 LAB — CBC WITH DIFFERENTIAL/PLATELET
BASOS ABS: 0 10*3/uL (ref 0.0–0.1)
BASOS PCT: 0 % (ref 0–1)
EOS ABS: 0.2 10*3/uL (ref 0.0–0.7)
Eosinophils Relative: 3 % (ref 0–5)
HCT: 40.8 % (ref 39.0–52.0)
Hemoglobin: 14.1 g/dL (ref 13.0–17.0)
Lymphocytes Relative: 40 % (ref 12–46)
Lymphs Abs: 2.4 10*3/uL (ref 0.7–4.0)
MCH: 30.1 pg (ref 26.0–34.0)
MCHC: 34.6 g/dL (ref 30.0–36.0)
MCV: 87.2 fL (ref 78.0–100.0)
MONOS PCT: 8 % (ref 3–12)
MPV: 8.7 fL (ref 8.6–12.4)
Monocytes Absolute: 0.5 10*3/uL (ref 0.1–1.0)
Neutro Abs: 2.9 10*3/uL (ref 1.7–7.7)
Neutrophils Relative %: 49 % (ref 43–77)
PLATELETS: 306 10*3/uL (ref 150–400)
RBC: 4.68 MIL/uL (ref 4.22–5.81)
RDW: 16.8 % — ABNORMAL HIGH (ref 11.5–15.5)
WBC: 6 10*3/uL (ref 4.0–10.5)

## 2014-12-20 LAB — COMPLETE METABOLIC PANEL WITH GFR
ALT: 9 U/L (ref 9–46)
AST: 12 U/L (ref 10–40)
Albumin: 4.1 g/dL (ref 3.6–5.1)
Alkaline Phosphatase: 68 U/L (ref 40–115)
BUN: 11 mg/dL (ref 7–25)
CO2: 25 mmol/L (ref 20–31)
Calcium: 9.5 mg/dL (ref 8.6–10.3)
Chloride: 103 mmol/L (ref 98–110)
Creat: 1 mg/dL (ref 0.60–1.35)
GFR, Est Non African American: >89 mL/min (ref >=60)
Glucose, Bld: 94 mg/dL (ref 65–99)
POTASSIUM: 4.5 mmol/L (ref 3.5–5.3)
SODIUM: 140 mmol/L (ref 135–146)
TOTAL PROTEIN: 7.5 g/dL (ref 6.1–8.1)
Total Bilirubin: 0.5 mg/dL (ref 0.2–1.2)

## 2014-12-20 NOTE — Telephone Encounter (Signed)
Lorre Nick Faxed medical records to: [facility] [place], [place] [address]  P: [phone] F: [phone]   Last year notes    COPY SCANNED

## 2014-12-20 NOTE — Telephone Encounter (Signed)
Faxed medical records to: [facility] [place], [place]  [id number]  P: [phone] F: [phone]   Last year notes    COPY SCANNED

## 2014-12-21 ENCOUNTER — Other Ambulatory Visit (HOSPITAL_BASED_OUTPATIENT_CLINIC_OR_DEPARTMENT_OTHER): Payer: No Typology Code available for payment source

## 2014-12-21 ENCOUNTER — Encounter: Payer: Self-pay | Admitting: Hematology & Oncology

## 2014-12-21 ENCOUNTER — Ambulatory Visit (HOSPITAL_BASED_OUTPATIENT_CLINIC_OR_DEPARTMENT_OTHER): Payer: No Typology Code available for payment source

## 2014-12-21 ENCOUNTER — Ambulatory Visit (HOSPITAL_BASED_OUTPATIENT_CLINIC_OR_DEPARTMENT_OTHER): Payer: No Typology Code available for payment source | Admitting: Hematology & Oncology

## 2014-12-21 VITALS — BP 109/69 | HR 87 | Temp 98.1°F | Resp 16 | Ht 67.0 in | Wt 140.0 lb

## 2014-12-21 DIAGNOSIS — C469 Kaposi's sarcoma, unspecified: Secondary | ICD-10-CM

## 2014-12-21 DIAGNOSIS — Z5111 Encounter for antineoplastic chemotherapy: Secondary | ICD-10-CM

## 2014-12-21 DIAGNOSIS — B20 Human immunodeficiency virus [HIV] disease: Secondary | ICD-10-CM

## 2014-12-21 LAB — CBC WITH DIFFERENTIAL (CANCER CENTER ONLY)
BASO#: 0 10*3/uL (ref 0.0–0.2)
BASO%: 0.5 % (ref 0.0–2.0)
EOS%: 3.4 % (ref 0.0–7.0)
Eosinophils Absolute: 0.2 10*3/uL (ref 0.0–0.5)
HEMATOCRIT: 40.4 % (ref 38.7–49.9)
HEMOGLOBIN: 14 g/dL (ref 13.0–17.1)
LYMPH#: 2.6 10*3/uL (ref 0.9–3.3)
LYMPH%: 40.6 % (ref 14.0–48.0)
MCH: 30.8 pg (ref 28.0–33.4)
MCHC: 34.7 g/dL (ref 32.0–35.9)
MCV: 89 fL (ref 82–98)
MONO#: 0.6 10*3/uL (ref 0.1–0.9)
MONO%: 9.3 % (ref 0.0–13.0)
NEUT#: 3 10*3/uL (ref 1.5–6.5)
NEUT%: 46.2 % (ref 40.0–80.0)
Platelets: 301 10*3/uL (ref 145–400)
RBC: 4.55 10*6/uL (ref 4.20–5.70)
RDW: 15.7 % (ref 11.1–15.7)
WBC: 6.5 10*3/uL (ref 4.0–10.0)

## 2014-12-21 LAB — CMP (CANCER CENTER ONLY)
ALBUMIN: 3.7 g/dL (ref 3.3–5.5)
ALK PHOS: 64 U/L (ref 26–84)
ALT: 12 U/L (ref 10–47)
AST: 18 U/L (ref 11–38)
BUN, Bld: 11 mg/dL (ref 7–22)
CALCIUM: 8.9 mg/dL (ref 8.0–10.3)
CO2: 25 mEq/L (ref 18–33)
CREATININE: 1.1 mg/dL (ref 0.6–1.2)
Chloride: 106 mEq/L (ref 98–108)
GLUCOSE: 103 mg/dL (ref 73–118)
Potassium: 3.6 mEq/L (ref 3.3–4.7)
SODIUM: 141 meq/L (ref 128–145)
TOTAL PROTEIN: 7.6 g/dL (ref 6.4–8.1)
Total Bilirubin: 0.6 mg/dl (ref 0.20–1.60)

## 2014-12-21 LAB — T-HELPER CELL (CD4) - (RCID CLINIC ONLY)
CD4 T CELL ABS: 260 /uL — AB (ref 400–2700)
CD4 T CELL HELPER: 12 % — AB (ref 33–55)

## 2014-12-21 LAB — HIV-1 RNA QUANT-NO REFLEX-BLD: HIV-1 RNA Quant, Log: <1.3 {Log} (ref <1.30)

## 2014-12-21 MED ORDER — DOXORUBICIN HCL LIPOSOMAL CHEMO INJECTION 2 MG/ML
20.0000 mg/m2 | Freq: Once | INTRAVENOUS | Status: AC
  Administered 2014-12-21: 36 mg via INTRAVENOUS
  Filled 2014-12-21: qty 18

## 2014-12-21 MED ORDER — SODIUM CHLORIDE 0.9 % IV SOLN
Freq: Once | INTRAVENOUS | Status: AC
  Administered 2014-12-21: 09:00:00 via INTRAVENOUS

## 2014-12-21 MED ORDER — SODIUM CHLORIDE 0.9 % IV SOLN
Freq: Once | INTRAVENOUS | Status: AC
  Administered 2014-12-21: 09:00:00 via INTRAVENOUS
  Filled 2014-12-21: qty 4

## 2014-12-21 NOTE — Progress Notes (Signed)
Hematology and Oncology Follow Up Visit  ION GONNELLA [id number] Jul 08, 1975 39 y.o. 12/21/2014   Principle Diagnosis:   Kaposi's sarcoma - progressive  HIV-asymptomatic  Current Therapy:    Doxil- q two-week dosing     Interim History:  Mr.  Pidcock is back for followup. He is doing well. He sees a Psychologist, sport and exercise in [place]. The surgeon needs to do some additional surgery on him. All 20, he will need to have his colostomy reversed. Mr. Bechard does not one have this done probably until the late fall.  He has noticed that his Kaposi's lesions are better. He always responds to the Doxil.  He's had no cough or shortness of breath. He's had no nausea or vomiting. He has had some chronic leg swelling. He is on some diuretics. I told him to keep taking the daily diuretics.  His been no fever. He's had no mouth sores.  His appetite has been pretty good. He brought in a  Burrito from [employer] to eat this morning.  His overall performance status is ECOG 1.  Medications:  Current outpatient prescriptions:  .  acyclovir (ZOVIRAX) 800 MG tablet, Take 800 mg by mouth 5 (five) times daily., Disp: , Rfl:  .  ascorbic acid (VITAMIN C) 500 MG tablet, Take by mouth., Disp: , Rfl:  .  Cholecalciferol (VITAMIN D3) 2000 UNITS TABS, Take 2,000 Units by mouth every morning. 2 tabs = 4,000 U daily, Disp: , Rfl:  .  citalopram (CELEXA) 10 MG tablet, TAKE 1 TABLET BY MOUTH EVERY DAY, Disp: 30 tablet, Rfl: 5 .  COCONUT OIL PO, Take 1,000 mg by mouth 2 (two) times daily., Disp: , Rfl:  .  Darunavir Ethanolate (PREZISTA) 800 MG tablet, TAKE 1 TABLET BY MOUTH EVERY DAY WITH BREAKFAST, Disp: 30 tablet, Rfl: 5 .  dronabinol (MARINOL) 5 MG capsule, Take 1 capsule (5 mg total) by mouth 2 (two) times daily before a meal., Disp: 60 capsule, Rfl: 3 .  elvitegravir-cobicistat-emtricitabine-tenofovir (GENVOYA) 150-150-200-10 MG TABS tablet, Take 1 tablet by mouth daily., Disp: 30 tablet, Rfl: 5 .  ferrous sulfate 325 (65  FE) MG EC tablet, Take by mouth., Disp: , Rfl:  .  gabapentin (NEURONTIN) 300 MG capsule, Take 1 capsule (300 mg total) by mouth 3 (three) times daily., Disp: 90 capsule, Rfl: 0 .  gabapentin (NEURONTIN) 300 MG capsule, Take 1 capsule (300 mg total) by mouth 3 (three) times daily., Disp: 90 capsule, Rfl: 3 .  LORazepam (ATIVAN) 0.5 MG tablet, Take 1 tablet (0.5 mg total) by mouth every 6 (six) hours as needed (Nausea or vomiting)., Disp: 30 tablet, Rfl: 2 .  ondansetron (ZOFRAN) 8 MG tablet, Take 1 tablet (8 mg total) by mouth 2 (two) times daily. Start the day after chemo for 2 days. Then take as needed for nausea or vomiting., Disp: 30 tablet, Rfl: 6 .  polyethylene glycol (MIRALAX / GLYCOLAX) packet, Take by mouth., Disp: , Rfl:  .  traZODone (DESYREL) 100 MG tablet, Take 1 tablet (100 mg total) by mouth at bedtime. (Patient taking differently: Take 100 mg by mouth as needed. ), Disp: 30 tablet, Rfl: 6 .  triamterene-hydrochlorothiazide (MAXZIDE-25) 37.5-25 MG per tablet, Take 1 tablet daily if needed for leg swelling, Disp: 30 tablet, Rfl: 6 No current facility-administered medications for this visit.  Facility-Administered Medications Ordered in Other Visits:  .  DOXOrubicin HCL LIPOSOMAL (DOXIL) 36 mg in dextrose 5 % 250 mL chemo infusion, 20 mg/m2 (Treatment Plan Actual), Intravenous, Once,  [name], MD, Last Rate: 268 mL/hr at 12/21/14 1025, 36 mg at 12/21/14 1025  Allergies:  Allergies  Allergen Reactions  . Sulfa Antibiotics Rash    Extreme itching     Past Medical History, Surgical history, Social history, and Family History were reviewed and updated.  Review of Systems: As above  Physical Exam:  height is 5\' 7"  (1.702 m) and weight is 140 lb (63.504 kg). His oral temperature is 98.1 F (36.7 C). His blood pressure is 109/69 and his pulse is 87. His respiration is 16.   Well-developed and well-nourished Afro-American male. Head and neck exam shows no ocular or oral  lesions. He has no palpable cervical or supraclavicular lymph nodes. Lungs are clear. Cardiac exam regular rate and rhythm with no murmurs rubs or bruits. Abdomen is soft. Has good bowel sounds. There is no fluid wave. There is no palpable liver or spleen tip. Neck exam shows no tenderness over the spine ribs or hips. Extremities shows some 1+ nonpitting edema in his legs. His good range motion of his joints. Has good strength in extremities. Skin exam does show the Kaposi's lesions. He has prominent lesions on the bottoms of both feet. They are mostly thicker in nature. They appear more prominent. They are a little bit darker in color. They are flatter. Neurological exam is non-focal.  Lab Results  Component Value Date   WBC 6.5 12/21/2014   HGB 14.0 12/21/2014   HCT 40.4 12/21/2014   MCV 89 12/21/2014   PLT 301 12/21/2014     Chemistry      Component Value Date/Time   NA 141 12/21/2014 0833   NA 140 12/20/2014 1037   NA 140 09/26/2014 1032   K 3.6 12/21/2014 0833   K 4.5 12/20/2014 1037   K 4.1 09/26/2014 1032   CL 106 12/21/2014 0833   CL 103 12/20/2014 1037   CO2 25 12/21/2014 0833   CO2 25 12/20/2014 1037   CO2 26 09/26/2014 1032   BUN 11 12/21/2014 0833   BUN 11 12/20/2014 1037   BUN 7.8 09/26/2014 1032   CREATININE 1.1 12/21/2014 0833   CREATININE 1.02 11/23/2014 0946   CREATININE 0.8 09/26/2014 1032      Component Value Date/Time   CALCIUM 8.9 12/21/2014 0833   CALCIUM 9.5 12/20/2014 1037   CALCIUM 8.9 09/26/2014 1032   ALKPHOS 64 12/21/2014 0833   ALKPHOS 68 12/20/2014 1037   ALKPHOS 76 09/26/2014 1032   AST 18 12/21/2014 0833   AST 12 12/20/2014 1037   AST 21 09/26/2014 1032   ALT 12 12/21/2014 0833   ALT 9 12/20/2014 1037   ALT 30 09/26/2014 1032   BILITOT 0.60 12/21/2014 0833   BILITOT 0.5 12/20/2014 1037   BILITOT <0.20 09/26/2014 1032         Impression and Plan: Mr. Bodie is a 39 year old African American gentleman.     I'm glad to see that he is  responding. The Kaposi's lesions are definitely less prominent and less pigmented.  I think the next issue that we have is his upcoming surgery. It sounds like his skin being about 6 weeks. We can certainly hold his treatment for 3 weeks before his surgery. Apparently, the surgeon will call us.  For now, we will have him come back in 2 weeks. I think probably every 2 week treatment would be appropriate for right now.  I may consider a MUGA scan or echocardiogram on him in the fall.  Less [name], MD 8/5/201611:17 AM

## 2014-12-21 NOTE — Patient Instructions (Signed)
[facility] Discharge Instructions for Patients Receiving Chemotherapy  Today you received the following chemotherapy agents :  Doxil.  To help prevent nausea and vomiting after your treatment, we encourage you to take your nausea medication as prescribed.   If you develop nausea and vomiting that is not controlled by your nausea medication, call the clinic.   BELOW ARE SYMPTOMS THAT SHOULD BE REPORTED IMMEDIATELY:  *FEVER GREATER THAN 100.5 F  *CHILLS WITH OR WITHOUT FEVER  NAUSEA AND VOMITING THAT IS NOT CONTROLLED WITH YOUR NAUSEA MEDICATION  *UNUSUAL SHORTNESS OF BREATH  *UNUSUAL BRUISING OR BLEEDING  TENDERNESS IN MOUTH AND THROAT WITH OR WITHOUT PRESENCE OF ULCERS  *URINARY PROBLEMS  *BOWEL PROBLEMS  UNUSUAL RASH Items with * indicate a potential emergency and should be followed up as soon as possible.  Feel free to call the clinic you have any questions or concerns. The clinic phone number is [phone].  Please show the CHEMO ALERT CARD at check-in to the Emergency Department and triage nurse.   

## 2014-12-24 ENCOUNTER — Other Ambulatory Visit: Payer: Self-pay | Admitting: *Deleted

## 2014-12-24 DIAGNOSIS — C469 Kaposi's sarcoma, unspecified: Secondary | ICD-10-CM

## 2014-12-24 DIAGNOSIS — R64 Cachexia: Secondary | ICD-10-CM

## 2014-12-24 MED ORDER — DRONABINOL 5 MG PO CAPS: 5.0000 mg | ORAL_CAPSULE | Freq: Two times a day (BID) | ORAL | Status: DC

## 2014-12-24 MED ORDER — MECLIZINE HCL 50 MG PO TABS: 25.0000 mg | ORAL_TABLET | Freq: Four times a day (QID) | ORAL | Status: DC | PRN

## 2014-12-26 ENCOUNTER — Other Ambulatory Visit: Payer: No Typology Code available for payment source

## 2014-12-27 ENCOUNTER — Other Ambulatory Visit: Payer: No Typology Code available for payment source

## 2014-12-27 ENCOUNTER — Ambulatory Visit: Payer: No Typology Code available for payment source | Admitting: Hematology & Oncology

## 2015-01-04 ENCOUNTER — Ambulatory Visit (HOSPITAL_BASED_OUTPATIENT_CLINIC_OR_DEPARTMENT_OTHER): Payer: Self-pay | Admitting: Family

## 2015-01-04 ENCOUNTER — Other Ambulatory Visit: Payer: Self-pay | Admitting: Hematology & Oncology

## 2015-01-04 ENCOUNTER — Other Ambulatory Visit (HOSPITAL_BASED_OUTPATIENT_CLINIC_OR_DEPARTMENT_OTHER): Payer: Self-pay

## 2015-01-04 ENCOUNTER — Ambulatory Visit (HOSPITAL_BASED_OUTPATIENT_CLINIC_OR_DEPARTMENT_OTHER): Payer: Self-pay

## 2015-01-04 ENCOUNTER — Encounter: Payer: Self-pay | Admitting: Family

## 2015-01-04 VITALS — BP 118/76 | HR 84 | Temp 97.9°F | Resp 16 | Ht 67.0 in | Wt 143.0 lb

## 2015-01-04 DIAGNOSIS — Z21 Asymptomatic human immunodeficiency virus [HIV] infection status: Secondary | ICD-10-CM

## 2015-01-04 DIAGNOSIS — Z5111 Encounter for antineoplastic chemotherapy: Secondary | ICD-10-CM

## 2015-01-04 DIAGNOSIS — C469 Kaposi's sarcoma, unspecified: Secondary | ICD-10-CM

## 2015-01-04 LAB — CBC WITH DIFFERENTIAL (CANCER CENTER ONLY)
BASO#: 0 10*3/uL (ref 0.0–0.2)
BASO%: 0.3 % (ref 0.0–2.0)
EOS ABS: 0.2 10*3/uL (ref 0.0–0.5)
EOS%: 2.5 % (ref 0.0–7.0)
HEMATOCRIT: 43.3 % (ref 38.7–49.9)
HEMOGLOBIN: 15.3 g/dL (ref 13.0–17.1)
LYMPH#: 2.6 10*3/uL (ref 0.9–3.3)
LYMPH%: 32.9 % (ref 14.0–48.0)
MCH: 31.1 pg (ref 28.0–33.4)
MCHC: 35.3 g/dL (ref 32.0–35.9)
MCV: 88 fL (ref 82–98)
MONO#: 0.5 10*3/uL (ref 0.1–0.9)
MONO%: 6.5 % (ref 0.0–13.0)
NEUT#: 4.6 10*3/uL (ref 1.5–6.5)
NEUT%: 57.8 % (ref 40.0–80.0)
Platelets: 277 10*3/uL (ref 145–400)
RBC: 4.92 10*6/uL (ref 4.20–5.70)
RDW: 15.2 % (ref 11.1–15.7)
WBC: 8 10*3/uL (ref 4.0–10.0)

## 2015-01-04 LAB — CMP (CANCER CENTER ONLY)
ALT(SGPT): 13 U/L (ref 10–47)
AST: 17 U/L (ref 11–38)
Albumin: 3.6 g/dL (ref 3.3–5.5)
Alkaline Phosphatase: 73 U/L (ref 26–84)
BUN, Bld: 12 mg/dL (ref 7–22)
CHLORIDE: 99 meq/L (ref 98–108)
CO2: 24 mEq/L (ref 18–33)
Calcium: 9.3 mg/dL (ref 8.0–10.3)
Creat: 0.9 mg/dl (ref 0.6–1.2)
Glucose, Bld: 108 mg/dL (ref 73–118)
POTASSIUM: 3.6 meq/L (ref 3.3–4.7)
Sodium: 136 mEq/L (ref 128–145)
TOTAL PROTEIN: 7.7 g/dL (ref 6.4–8.1)
Total Bilirubin: 0.5 mg/dl (ref 0.20–1.60)

## 2015-01-04 LAB — LACTATE DEHYDROGENASE: LDH: 159 U/L (ref 94–250)

## 2015-01-04 MED ORDER — SODIUM CHLORIDE 0.9 % IV SOLN
Freq: Once | INTRAVENOUS | Status: AC
  Administered 2015-01-04: 10:00:00 via INTRAVENOUS

## 2015-01-04 MED ORDER — DOXORUBICIN HCL LIPOSOMAL CHEMO INJECTION 2 MG/ML
20.0000 mg/m2 | Freq: Once | INTRAVENOUS | Status: AC
  Administered 2015-01-04: 36 mg via INTRAVENOUS
  Filled 2015-01-04: qty 18

## 2015-01-04 MED ORDER — SODIUM CHLORIDE 0.9 % IV SOLN
Freq: Once | INTRAVENOUS | Status: AC
  Administered 2015-01-04: 10:00:00 via INTRAVENOUS
  Filled 2015-01-04: qty 4

## 2015-01-04 NOTE — Progress Notes (Signed)
Hematology and Oncology Follow Up Visit  ROLAN WRIGHTSMAN [id number] 1976-01-04 39 y.o. 01/04/2015   Principle Diagnosis:  Kaposi's sarcoma - progressive HIV-asymptomatic  Current Therapy:   Observation    Interim History:  Mr. Nippert is here today for a follow-up. He is doing well. He had some issues with vertigo and is now taking Antivert. This makes him sleepy at times but he is adjusting well and the dizziness has resolved. His appetite is much improved and he is staying well hydrated. 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  His sarcoma lesions continue to fade. He has had no new lesions appear.  He denies fever, chills, n/v, cough, rash, SOB, chest pain, palpitations, abdominal pain, blood in urine or stool.  The swelling in his legs is improved with him being on daily Maxzide. He has no tenderness, numbness or tingling in his extremities.  His Echo in May showed an EF of 55-60%.   Medications:    Medication List       This list is accurate as of: 01/04/15  9:46 AM.  Always use your most recent med list.               acyclovir 800 MG tablet  Commonly known as:  ZOVIRAX  Take 800 mg by mouth 5 (five) times daily.     ascorbic acid 500 MG tablet  Commonly known as:  VITAMIN C  Take by mouth.     citalopram 10 MG tablet  Commonly known as:  CELEXA  TAKE 1 TABLET BY MOUTH EVERY DAY     COCONUT OIL PO  Take 1,000 mg by mouth 2 (two) times daily.     Darunavir Ethanolate 800 MG tablet  Commonly known as:  PREZISTA  TAKE 1 TABLET BY MOUTH EVERY DAY WITH BREAKFAST     dronabinol 5 MG capsule  Commonly known as:  MARINOL  Take 1 capsule (5 mg total) by mouth 2 (two) times daily before a meal.     elvitegravir-cobicistat-emtricitabine-tenofovir 150-150-200-10 MG Tabs tablet  Commonly known as:  GENVOYA  Take 1 tablet by mouth daily.     ferrous sulfate 325 (65 FE) MG EC tablet  Take by mouth.     gabapentin 300 MG capsule  Commonly known as:   NEURONTIN  Take 1 capsule (300 mg total) by mouth 3 (three) times daily.     gabapentin 300 MG capsule  Commonly known as:  NEURONTIN  Take 1 capsule (300 mg total) by mouth 3 (three) times daily.     LORazepam 0.5 MG tablet  Commonly known as:  ATIVAN  Take 1 tablet (0.5 mg total) by mouth every 6 (six) hours as needed (Nausea or vomiting).     meclizine 50 MG tablet  Commonly known as:  ANTIVERT  Take 0.5 tablets (25 mg total) by mouth every 6 (six) hours as needed.     ondansetron 8 MG tablet  Commonly known as:  ZOFRAN  Take 1 tablet (8 mg total) by mouth 2 (two) times daily. Start the day after chemo for 2 days. Then take as needed for nausea or vomiting.     polyethylene glycol packet  Commonly known as:  MIRALAX / GLYCOLAX  Take by mouth.     traZODone 100 MG tablet  Commonly known as:  DESYREL  Take 1 tablet (100 mg total) by mouth at bedtime.     triamterene-hydrochlorothiazide 37.5-25 MG per tablet  Commonly known as:  MAXZIDE-25  Take 1 tablet  daily if needed for leg swelling     Vitamin D3 2000 UNITS Tabs  Take 2,000 Units by mouth every morning. 2 tabs = 4,000 U daily        Allergies:  Allergies  Allergen Reactions  . Sulfa Antibiotics Rash    Extreme itching     Past Medical History, Surgical history, Social history, and Family History were reviewed and updated.  Review of Systems: All other 10 point review of systems is negative.   Physical Exam:  height is 5\' 7"  (1.702 m) and weight is 143 lb (64.864 kg). His oral temperature is 97.9 F (36.6 C). His blood pressure is 118/76 and his pulse is 84. His respiration is 16.   Wt Readings from Last 3 Encounters:  01/04/15 143 lb (64.864 kg)  12/21/14 140 lb (63.504 kg)  11/30/14 138 lb (62.596 kg)    Ocular: Sclerae unicteric, pupils equal, round and reactive to light Ear-nose-throat: Oropharynx clear, dentition fair Lymphatic: No cervical or supraclavicular adenopathy Lungs no rales or rhonchi,  good excursion bilaterally Heart regular rate and rhythm, no murmur appreciated Abd soft, nontender, positive bowel sounds MSK no focal spinal tenderness, no joint edema Neuro: non-focal, well-oriented, appropriate affect  Lab Results  Component Value Date   WBC 8.0 01/04/2015   HGB 15.3 01/04/2015   HCT 43.3 01/04/2015   MCV 88 01/04/2015   PLT 277 01/04/2015   No results found for: FERRITIN, IRON, TIBC, UIBC, IRONPCTSAT Lab Results  Component Value Date   RBC 4.92 01/04/2015   No results found for: KPAFRELGTCHN, LAMBDASER, KAPLAMBRATIO No results found for: IGGSERUM, IGA, IGMSERUM No results found for: Odetta Pink, SPEI   Chemistry      Component Value Date/Time   NA 136 01/04/2015 0912   NA 140 12/20/2014 1037   NA 140 09/26/2014 1032   K 3.6 01/04/2015 0912   K 4.5 12/20/2014 1037   K 4.1 09/26/2014 1032   CL 99 01/04/2015 0912   CL 103 12/20/2014 1037   CO2 24 01/04/2015 0912   CO2 25 12/20/2014 1037   CO2 26 09/26/2014 1032   BUN 12 01/04/2015 0912   BUN 11 12/20/2014 1037   BUN 7.8 09/26/2014 1032   CREATININE 0.9 01/04/2015 0912   CREATININE 1.02 11/23/2014 0946   CREATININE 0.8 09/26/2014 1032      Component Value Date/Time   CALCIUM 9.3 01/04/2015 0912   CALCIUM 9.5 12/20/2014 1037   CALCIUM 8.9 09/26/2014 1032   ALKPHOS 73 01/04/2015 0912   ALKPHOS 68 12/20/2014 1037   ALKPHOS 76 09/26/2014 1032   AST 17 01/04/2015 0912   AST 12 12/20/2014 1037   AST 21 09/26/2014 1032   ALT 13 01/04/2015 0912   ALT 9 12/20/2014 1037   ALT 30 09/26/2014 1032   BILITOT 0.50 01/04/2015 0912   BILITOT 0.5 12/20/2014 1037   BILITOT <0.20 09/26/2014 1032     Impression and Plan: Mr. Arzuaga is 39 year old African American gentleman with Kaposi's sarcoma. He has responded nicely to the Doxil. His Kaposi's lesions have healed and continue to fade. No new lesions have appeared.  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 He sees a Psychologist, sport and exercise in [place] and that office will contact us when his surgery is scheduled so we can know when to hold treatment.  He will also need another ECHO later this fall.  We will plan  to see him back in 1 month for labs and follow-up. He will continue his Doxil infusions every 2 weeks.  He will contact us with any questions or concerns. We can certainly see him sooner if need be.   Eliezer Bottom, NP 8/19/20169:46 AM

## 2015-01-04 NOTE — Patient Instructions (Signed)
[facility] Discharge Instructions for Patients Receiving Chemotherapy  Today you received the following chemotherapy agents :  Doxil.  To help prevent nausea and vomiting after your treatment, we encourage you to take your nausea medication as prescribed.   If you develop nausea and vomiting that is not controlled by your nausea medication, call the clinic.   BELOW ARE SYMPTOMS THAT SHOULD BE REPORTED IMMEDIATELY:  *FEVER GREATER THAN 100.5 F  *CHILLS WITH OR WITHOUT FEVER  NAUSEA AND VOMITING THAT IS NOT CONTROLLED WITH YOUR NAUSEA MEDICATION  *UNUSUAL SHORTNESS OF BREATH  *UNUSUAL BRUISING OR BLEEDING  TENDERNESS IN MOUTH AND THROAT WITH OR WITHOUT PRESENCE OF ULCERS  *URINARY PROBLEMS  *BOWEL PROBLEMS  UNUSUAL RASH Items with * indicate a potential emergency and should be followed up as soon as possible.  Feel free to call the clinic you have any questions or concerns. The clinic phone number is [phone].  Please show the CHEMO ALERT CARD at check-in to the Emergency Department and triage nurse.   

## 2015-01-08 ENCOUNTER — Ambulatory Visit: Payer: Self-pay | Admitting: Internal Medicine

## 2015-01-11 ENCOUNTER — Ambulatory Visit: Payer: Self-pay

## 2015-01-11 ENCOUNTER — Other Ambulatory Visit: Payer: Self-pay

## 2015-01-18 ENCOUNTER — Other Ambulatory Visit (HOSPITAL_BASED_OUTPATIENT_CLINIC_OR_DEPARTMENT_OTHER): Payer: Medicaid Other

## 2015-01-18 ENCOUNTER — Ambulatory Visit (HOSPITAL_BASED_OUTPATIENT_CLINIC_OR_DEPARTMENT_OTHER): Payer: Medicaid Other

## 2015-01-18 VITALS — BP 113/71 | HR 74 | Temp 98.0°F | Resp 18

## 2015-01-18 DIAGNOSIS — C469 Kaposi's sarcoma, unspecified: Secondary | ICD-10-CM

## 2015-01-18 DIAGNOSIS — Z5111 Encounter for antineoplastic chemotherapy: Secondary | ICD-10-CM | POA: Diagnosis not present

## 2015-01-18 LAB — CMP (CANCER CENTER ONLY)
ALBUMIN: 3.6 g/dL (ref 3.3–5.5)
ALK PHOS: 70 U/L (ref 26–84)
ALT: 14 U/L (ref 10–47)
AST: 19 U/L (ref 11–38)
BILIRUBIN TOTAL: 0.6 mg/dL (ref 0.20–1.60)
BUN, Bld: 12 mg/dL (ref 7–22)
CO2: 27 meq/L (ref 18–33)
CREATININE: 1 mg/dL (ref 0.6–1.2)
Calcium: 9.6 mg/dL (ref 8.0–10.3)
Chloride: 98 mEq/L (ref 98–108)
Glucose, Bld: 117 mg/dL (ref 73–118)
Potassium: 4.2 mEq/L (ref 3.3–4.7)
SODIUM: 131 meq/L (ref 128–145)
TOTAL PROTEIN: 7.4 g/dL (ref 6.4–8.1)

## 2015-01-18 LAB — CBC WITH DIFFERENTIAL (CANCER CENTER ONLY)
BASO#: 0 10*3/uL (ref 0.0–0.2)
BASO%: 0.4 % (ref 0.0–2.0)
EOS%: 2.5 % (ref 0.0–7.0)
Eosinophils Absolute: 0.2 10*3/uL (ref 0.0–0.5)
HEMATOCRIT: 42.9 % (ref 38.7–49.9)
HEMOGLOBIN: 14.8 g/dL (ref 13.0–17.1)
LYMPH#: 1.9 10*3/uL (ref 0.9–3.3)
LYMPH%: 26.5 % (ref 14.0–48.0)
MCH: 30.6 pg (ref 28.0–33.4)
MCHC: 34.5 g/dL (ref 32.0–35.9)
MCV: 89 fL (ref 82–98)
MONO#: 0.5 10*3/uL (ref 0.1–0.9)
MONO%: 6.6 % (ref 0.0–13.0)
NEUT%: 64 % (ref 40.0–80.0)
NEUTROS ABS: 4.6 10*3/uL (ref 1.5–6.5)
Platelets: 279 10*3/uL (ref 145–400)
RBC: 4.83 10*6/uL (ref 4.20–5.70)
RDW: 15.1 % (ref 11.1–15.7)
WBC: 7.1 10*3/uL (ref 4.0–10.0)

## 2015-01-18 LAB — LACTATE DEHYDROGENASE: LDH: 166 U/L (ref 94–250)

## 2015-01-18 MED ORDER — DEXTROSE 5 % IV SOLN
20.0000 mg/m2 | Freq: Once | INTRAVENOUS | Status: AC
  Administered 2015-01-18: 36 mg via INTRAVENOUS
  Filled 2015-01-18: qty 18

## 2015-01-18 MED ORDER — SODIUM CHLORIDE 0.9 % IV SOLN
Freq: Once | INTRAVENOUS | Status: AC
  Administered 2015-01-18: 12:00:00 via INTRAVENOUS

## 2015-01-18 MED ORDER — SODIUM CHLORIDE 0.9 % IV SOLN
Freq: Once | INTRAVENOUS | Status: AC
  Administered 2015-01-18: 12:00:00 via INTRAVENOUS
  Filled 2015-01-18: qty 4

## 2015-01-18 NOTE — Patient Instructions (Signed)
Doxorubicin Liposomal injection What is this medicine? LIPOSOMAL DOXORUBICIN (LIP oh som al dox oh ROO bi sin) is a chemotherapy drug. This medicine is used to treat many kinds of cancer like Kaposi's sarcoma, multiple myeloma, and ovarian cancer. This medicine may be used for other purposes; ask your health care provider or pharmacist if you have questions. COMMON BRAND NAME(S): Doxil, Lipodox What should I tell my health care provider before I take this medicine? They need to know if you have any of these conditions: -blood disorders -heart disease -infection (especially a virus infection such as chickenpox, cold sores, or herpes) -liver disease -recent or ongoing radiation therapy -an unusual or allergic reaction to doxorubicin, other chemotherapy agents, soybeans, other medicines, foods, dyes, or preservatives -pregnant or trying to get pregnant -breast-feeding How should I use this medicine? This drug is given as an infusion into a vein. It is administered in a hospital or clinic by a specially trained health care professional. If you have pain, swelling, burning or any unusual feeling around the site of your injection, tell your health care professional right away. Talk to your pediatrician regarding the use of this medicine in children. Special care may be needed. Overdosage: If you think you have taken too much of this medicine contact a poison control center or emergency room at once. NOTE: This medicine is only for you. Do not share this medicine with others. What if I miss a dose? It is important not to miss your dose. Call your doctor or health care professional if you are unable to keep an appointment. What may interact with this medicine? Do not take this medicine with any of the following medications: -zidovudine This medicine may also interact with the following medications: -medicines to increase blood counts like filgrastim, pegfilgrastim, sargramostim -vaccines Talk to  your doctor or health care professional before taking any of these medicines: -acetaminophen -aspirin -ibuprofen -ketoprofen -naproxen This list may not describe all possible interactions. Give your health care provider a list of all the medicines, herbs, non-prescription drugs, or dietary supplements you use. Also tell them if you smoke, drink alcohol, or use illegal drugs. Some items may interact with your medicine. What should I watch for while using this medicine? Your condition will be monitored carefully while you are receiving this medicine. You will need important blood work done while you are taking this medicine. This drug may make you feel generally unwell. This is not uncommon, as chemotherapy can affect healthy cells as well as cancer cells. Report any side effects. Continue your course of treatment even though you feel ill unless your doctor tells you to stop. Your urine may turn orange-red for a few days after your dose. This is not blood. If your urine is dark or brown, call your doctor. In some cases, you may be given additional medicines to help with side effects. Follow all directions for their use. Call your doctor or health care professional for advice if you get a fever (100.5 degrees F or higher), chills or sore throat, or other symptoms of a cold or flu. Do not treat yourself. This drug decreases your body's ability to fight infections. Try to avoid being around people who are sick. This medicine may increase your risk to bruise or bleed. Call your doctor or health care professional if you notice any unusual bleeding. Be careful brushing and flossing your teeth or using a toothpick because you may get an infection or bleed more easily. If you have any dental  work done, tell your dentist you are receiving this medicine. Avoid taking products that contain aspirin, acetaminophen, ibuprofen, naproxen, or ketoprofen unless instructed by your doctor. These medicines may hide a  fever. Men and women of childbearing age should use effective birth control methods while using taking this medicine. Do not become pregnant while taking this medicine. There is a potential for serious side effects to an unborn child. Talk to your health care professional or pharmacist for more information. Do not breast-feed an infant while taking this medicine. Talk to your doctor about your risk of cancer. You may be more at risk for certain types of cancers if you take this medicine. What side effects may I notice from receiving this medicine? Side effects that you should report to your doctor or health care professional as soon as possible: -allergic reactions like skin rash, itching or hives, swelling of the face, lips, or tongue -low blood counts - this medicine may decrease the number of white blood cells, red blood cells and platelets. You may be at increased risk for infections and bleeding. -signs of hand-foot syndrome - tingling or burning, redness, flaking, swelling, small blisters, or small sores on the palms of your hands or the soles of your feet -signs of infection - fever or chills, cough, sore throat, pain or difficulty passing urine -signs of decreased platelets or bleeding - bruising, pinpoint red spots on the skin, black, tarry stools, blood in the urine -signs of decreased red blood cells - unusually weak or tired, fainting spells, lightheadedness -back pain, chills, facial flushing, fever, headache, tightness in the chest or throat during the infusion -breathing problems -chest pain -fast, irregular heartbeat -mouth pain, redness, sores -pain, swelling, redness at site where injected -pain, tingling, numbness in the hands or feet -swelling of ankles, feet, or hands -vomiting Side effects that usually do not require medical attention (report to your doctor or health care professional if they continue or are bothersome): -diarrhea -hair loss -loss of appetite -nail  discoloration or damage -nausea -red or watery eyes -red colored urine -stomach upset This list may not describe all possible side effects. Call your doctor for medical advice about side effects. You may report side effects to FDA at 1-800-FDA-1088. Where should I keep my medicine? This drug is given in a hospital or clinic and will not be stored at home. NOTE: This sheet is a summary. It may not cover all possible information. If you have questions about this medicine, talk to your doctor, pharmacist, or health care provider.  2015, [employer]vier/Gold Standard. (2012-01-22 10:12:56)  

## 2015-01-23 ENCOUNTER — Other Ambulatory Visit: Payer: Self-pay | Admitting: Hematology & Oncology

## 2015-01-23 ENCOUNTER — Other Ambulatory Visit: Payer: Self-pay | Admitting: Internal Medicine

## 2015-01-24 ENCOUNTER — Telehealth: Payer: Self-pay | Admitting: Hematology & Oncology

## 2015-01-24 NOTE — Telephone Encounter (Signed)
Faxed medical records via fax today to: [facility] Ph: [phone]  Fx: [phone]    Medical Records requested from 12/21/2014 to present  HEARING DATE: 02/28/2015      COPY SCANNED

## 2015-01-26 ENCOUNTER — Other Ambulatory Visit: Payer: Self-pay | Admitting: Internal Medicine

## 2015-01-26 DIAGNOSIS — F329 Major depressive disorder, single episode, unspecified: Secondary | ICD-10-CM

## 2015-01-26 DIAGNOSIS — F32A Depression, unspecified: Secondary | ICD-10-CM

## 2015-01-28 ENCOUNTER — Telehealth: Payer: Self-pay | Admitting: *Deleted

## 2015-01-28 NOTE — Telephone Encounter (Signed)
Pt transferring care to [place], [place].

## 2015-01-31 ENCOUNTER — Ambulatory Visit (INDEPENDENT_AMBULATORY_CARE_PROVIDER_SITE_OTHER): Payer: Medicaid Other | Admitting: Internal Medicine

## 2015-01-31 ENCOUNTER — Other Ambulatory Visit: Payer: Self-pay | Admitting: *Deleted

## 2015-01-31 ENCOUNTER — Encounter: Payer: Self-pay | Admitting: Internal Medicine

## 2015-01-31 VITALS — BP 119/78 | HR 79 | Temp 98.1°F | Wt 144.0 lb

## 2015-01-31 DIAGNOSIS — C469 Kaposi's sarcoma, unspecified: Secondary | ICD-10-CM | POA: Diagnosis not present

## 2015-01-31 DIAGNOSIS — Z23 Encounter for immunization: Secondary | ICD-10-CM

## 2015-01-31 DIAGNOSIS — B20 Human immunodeficiency virus [HIV] disease: Secondary | ICD-10-CM | POA: Diagnosis not present

## 2015-01-31 DIAGNOSIS — Z113 Encounter for screening for infections with a predominantly sexual mode of transmission: Secondary | ICD-10-CM

## 2015-01-31 NOTE — Assessment & Plan Note (Signed)
Continues to follow Dr. Marin Olp.

## 2015-01-31 NOTE — Progress Notes (Signed)
  Subjective:    Patient ID: Todd Vincent, male    DOB: 1975-10-22, 39 y.o.   MRN: [id number]  HPI He comes in for routine follow up  He was on [place] and truvada, prezista, norvir and I changed him to [place] last visit.  Some minimal initial effects but no issues now.  Pleased with the easier regimen.  He has been on doxil for Kaposi's sarcoma.   His CD4 is 260.  No new issues.  No weight loss, trying to maintain weight.  no diarrhea. No new complaints.    Review of Systems  Constitutional: Negative for fever and chills.  HENT: Negative for sore throat and trouble swallowing.   Eyes: Negative for visual disturbance.  Respiratory: Negative for cough and shortness of breath.   Cardiovascular: Negative for chest pain.  Gastrointestinal: Negative for nausea and diarrhea.  Musculoskeletal: Negative for myalgias and arthralgias.  Skin: Negative for rash.  Neurological: Negative for dizziness, light-headedness and headaches.  Hematological: Negative for adenopathy.  Psychiatric/Behavioral: Negative for dysphoric mood.       Objective:   Physical Exam  Constitutional: He appears well-developed and well-nourished. No distress.  HENT:  Mouth/Throat: No oropharyngeal exudate.  Eyes: No scleral icterus.  Cardiovascular: Normal rate, regular rhythm and normal heart sounds.   No murmur heard. Pulmonary/Chest: Effort normal and breath sounds normal. No respiratory distress.  Lymphadenopathy:    He has no cervical adenopathy.  Skin:             Assessment & Plan:

## 2015-01-31 NOTE — Assessment & Plan Note (Signed)
Doing great on new regimen.  RTC 4 months.

## 2015-02-01 ENCOUNTER — Ambulatory Visit (HOSPITAL_BASED_OUTPATIENT_CLINIC_OR_DEPARTMENT_OTHER): Payer: Medicaid Other | Admitting: Hematology & Oncology

## 2015-02-01 ENCOUNTER — Encounter: Payer: Self-pay | Admitting: Hematology & Oncology

## 2015-02-01 ENCOUNTER — Other Ambulatory Visit (HOSPITAL_BASED_OUTPATIENT_CLINIC_OR_DEPARTMENT_OTHER): Payer: Medicaid Other

## 2015-02-01 ENCOUNTER — Ambulatory Visit (HOSPITAL_BASED_OUTPATIENT_CLINIC_OR_DEPARTMENT_OTHER): Payer: Medicaid Other

## 2015-02-01 VITALS — BP 119/73 | HR 86 | Temp 97.9°F | Resp 18 | Ht 67.0 in | Wt 148.0 lb

## 2015-02-01 DIAGNOSIS — Z5111 Encounter for antineoplastic chemotherapy: Secondary | ICD-10-CM | POA: Diagnosis present

## 2015-02-01 DIAGNOSIS — C469 Kaposi's sarcoma, unspecified: Secondary | ICD-10-CM

## 2015-02-01 LAB — CMP (CANCER CENTER ONLY)
ALT(SGPT): 17 U/L (ref 10–47)
AST: 21 U/L (ref 11–38)
Albumin: 3.4 g/dL (ref 3.3–5.5)
Alkaline Phosphatase: 72 U/L (ref 26–84)
BUN: 12 mg/dL (ref 7–22)
CHLORIDE: 101 meq/L (ref 98–108)
CO2: 28 meq/L (ref 18–33)
CREATININE: 1.1 mg/dL (ref 0.6–1.2)
Calcium: 9 mg/dL (ref 8.0–10.3)
Glucose, Bld: 83 mg/dL (ref 73–118)
Potassium: 3.6 mEq/L (ref 3.3–4.7)
SODIUM: 139 meq/L (ref 128–145)
Total Bilirubin: 0.6 mg/dl (ref 0.20–1.60)
Total Protein: 7.1 g/dL (ref 6.4–8.1)

## 2015-02-01 LAB — CBC WITH DIFFERENTIAL (CANCER CENTER ONLY)
BASO#: 0 10*3/uL (ref 0.0–0.2)
BASO%: 0.3 % (ref 0.0–2.0)
EOS ABS: 0.3 10*3/uL (ref 0.0–0.5)
EOS%: 3.6 % (ref 0.0–7.0)
HCT: 42.7 % (ref 38.7–49.9)
HGB: 14.7 g/dL (ref 13.0–17.1)
LYMPH#: 1.9 10*3/uL (ref 0.9–3.3)
LYMPH%: 20.7 % (ref 14.0–48.0)
MCH: 30.8 pg (ref 28.0–33.4)
MCHC: 34.4 g/dL (ref 32.0–35.9)
MCV: 89 fL (ref 82–98)
MONO#: 0.5 10*3/uL (ref 0.1–0.9)
MONO%: 5.9 % (ref 0.0–13.0)
NEUT#: 6.4 10*3/uL (ref 1.5–6.5)
NEUT%: 69.5 % (ref 40.0–80.0)
PLATELETS: 287 10*3/uL (ref 145–400)
RBC: 4.78 10*6/uL (ref 4.20–5.70)
RDW: 15.3 % (ref 11.1–15.7)
WBC: 9.2 10*3/uL (ref 4.0–10.0)

## 2015-02-01 MED ORDER — SODIUM CHLORIDE 0.9 % IJ SOLN
10.0000 mL | INTRAMUSCULAR | Status: DC | PRN
  Filled 2015-02-01: qty 10

## 2015-02-01 MED ORDER — SODIUM CHLORIDE 0.9 % IV SOLN
Freq: Once | INTRAVENOUS | Status: AC
  Administered 2015-02-01: 09:00:00 via INTRAVENOUS

## 2015-02-01 MED ORDER — SODIUM CHLORIDE 0.9 % IJ SOLN
3.0000 mL | INTRAMUSCULAR | Status: DC | PRN
  Filled 2015-02-01: qty 10

## 2015-02-01 MED ORDER — ALTEPLASE 2 MG IJ SOLR
2.0000 mg | Freq: Once | INTRAMUSCULAR | Status: DC | PRN
  Filled 2015-02-01: qty 2

## 2015-02-01 MED ORDER — SODIUM CHLORIDE 0.9 % IV SOLN
Freq: Once | INTRAVENOUS | Status: AC
  Administered 2015-02-01: 09:00:00 via INTRAVENOUS
  Filled 2015-02-01: qty 4

## 2015-02-01 MED ORDER — DEXTROSE 5 % IV SOLN
20.0000 mg/m2 | Freq: Once | INTRAVENOUS | Status: AC
  Administered 2015-02-01: 36 mg via INTRAVENOUS
  Filled 2015-02-01: qty 18

## 2015-02-01 MED ORDER — HEPARIN SOD (PORK) LOCK FLUSH 100 UNIT/ML IV SOLN
500.0000 [IU] | Freq: Once | INTRAVENOUS | Status: DC | PRN
  Filled 2015-02-01: qty 5

## 2015-02-01 MED ORDER — HEPARIN SOD (PORK) LOCK FLUSH 100 UNIT/ML IV SOLN
250.0000 [IU] | Freq: Once | INTRAVENOUS | Status: DC | PRN
  Filled 2015-02-01: qty 5

## 2015-02-01 NOTE — Progress Notes (Signed)
Hematology and Oncology Follow Up Visit  Todd Vincent [id number] 1976-01-26 39 y.o. 02/01/2015   Principle Diagnosis:   Kaposi's sarcoma - progressive  HIV-asymptomatic  Current Therapy:    Doxil- q 3-week dosing     Interim History:  [name] is back for followup. He did not have his colostomy reversed. He was waiting until March. He says the surgeon is okay with this.  Overall, he feels well. He had a good Labor Day weekend.  He has had some swelling in the legs. This is chronic.  His Kaposi's lesions have not progressed. He feels that they are improving.  He was seen by the infectious disease doctor for the HIV. He says that the HIV was not detectable. Again, I think that his Kaposi's will coincide clearly an closely with the HIV status.  He's had a good appetite. He's had no nausea or vomiting.  He's had no bleeding. He's had no headaches.   His overall performance status is ECOG 1.  Medications:  Current outpatient prescriptions:  .  acyclovir (ZOVIRAX) 800 MG tablet, Take 800 mg by mouth 5 (five) times daily., Disp: , Rfl:  .  ascorbic acid (VITAMIN C) 500 MG tablet, Take by mouth., Disp: , Rfl:  .  Cholecalciferol (VITAMIN D3) 2000 UNITS TABS, Take 2,000 Units by mouth every morning. 2 tabs = 4,000 U daily, Disp: , Rfl:  .  citalopram (CELEXA) 10 MG tablet, TAKE 1 TABLET BY MOUTH DAILY, Disp: 30 tablet, Rfl: 0 .  COCONUT OIL PO, Take 1,000 mg by mouth 2 (two) times daily., Disp: , Rfl:  .  Darunavir Ethanolate (PREZISTA) 800 MG tablet, TAKE 1 TABLET BY MOUTH EVERY DAY WITH BREAKFAST, Disp: 30 tablet, Rfl: 5 .  dronabinol (MARINOL) 5 MG capsule, Take 1 capsule (5 mg total) by mouth 2 (two) times daily before a meal., Disp: 60 capsule, Rfl: 3 .  elvitegravir-cobicistat-emtricitabine-tenofovir (GENVOYA) 150-150-200-10 MG TABS tablet, Take 1 tablet by mouth daily., Disp: 30 tablet, Rfl: 5 .  ferrous sulfate 325 (65 FE) MG EC tablet, Take by mouth., Disp: , Rfl:  .   gabapentin (NEURONTIN) 300 MG capsule, Take 1 capsule (300 mg total) by mouth 3 (three) times daily., Disp: 90 capsule, Rfl: 0 .  LORazepam (ATIVAN) 0.5 MG tablet, Take 1 tablet (0.5 mg total) by mouth every 6 (six) hours as needed (Nausea or vomiting)., Disp: 30 tablet, Rfl: 2 .  meclizine (ANTIVERT) 25 MG tablet, TAKE 1 TABLET BY MOUTH EVERY 6 HOURS AS NEEDED, Disp: 60 tablet, Rfl: 0 .  ondansetron (ZOFRAN) 8 MG tablet, Take 1 tablet (8 mg total) by mouth 2 (two) times daily. Start the day after chemo for 2 days. Then take as needed for nausea or vomiting., Disp: 30 tablet, Rfl: 6 .  traZODone (DESYREL) 100 MG tablet, Take 1 tablet (100 mg total) by mouth at bedtime. (Patient taking differently: Take 100 mg by mouth as needed. ), Disp: 30 tablet, Rfl: 6 .  triamterene-hydrochlorothiazide (MAXZIDE-25) 37.5-25 MG per tablet, Take 1 tablet daily if needed for leg swelling, Disp: 30 tablet, Rfl: 6 No current facility-administered medications for this visit.  Facility-Administered Medications Ordered in Other Visits:  .  0.9 %  sodium chloride infusion, , Intravenous, Once, [name], MD .  alteplase (CATHFLO ACTIVASE) injection 2 mg, 2 mg, Intracatheter, Once PRN, [name], MD .  DOXOrubicin HCL LIPOSOMAL (DOXIL) 36 mg in dextrose 5 % 250 mL chemo infusion, 20 mg/m2 (Treatment Plan  Actual), Intravenous, Once, [name], MD .  heparin lock flush 100 unit/mL, 500 Units, Intracatheter, Once PRN, [name], MD .  heparin lock flush 100 unit/mL, 250 Units, Intracatheter, Once PRN, [name], MD .  ondansetron (ZOFRAN) 8 mg, dexamethasone (DECADRON) 10 mg in sodium chloride 0.9 % 50 mL IVPB, , Intravenous, Once, [name], MD .  sodium chloride 0.9 % injection 10 mL, 10 mL, Intracatheter, PRN, [name], MD .  sodium chloride 0.9 % injection 3 mL, 3 mL, Intravenous, PRN, [name], MD  Allergies:  Allergies  Allergen Reactions  . Sulfa Antibiotics Rash     Extreme itching     Past Medical History, Surgical history, Social history, and Family History were reviewed and updated.  Review of Systems: As above  Physical Exam:  height is 5\' 7"  (1.702 m) and weight is 148 lb (67.132 kg). His oral temperature is 97.9 F (36.6 C). His blood pressure is 119/73 and his pulse is 86. His respiration is 18.   Well-developed and well-nourished Afro-American male. Head and neck exam shows no ocular or oral lesions. He has no palpable cervical or supraclavicular lymph nodes. Lungs are clear. Cardiac exam regular rate and rhythm with no murmurs rubs or bruits. Abdomen is soft. Has good bowel sounds. There is no fluid wave. There is no palpable liver or spleen tip. Neck exam shows no tenderness over the spine ribs or hips. Extremities shows some 1+ nonpitting edema in his legs. His good range motion of his joints. Has good strength in extremities. Skin exam does show the Kaposi's lesions. He has prominent lesions on the bottoms of both feet. They are mostly thicker in nature. They appear more prominent. They are a little bit darker in color. They are flatter. Neurological exam is non-focal.  Lab Results  Component Value Date   WBC 9.2 02/01/2015   HGB 14.7 02/01/2015   HCT 42.7 02/01/2015   MCV 89 02/01/2015   PLT 287 02/01/2015     Chemistry      Component Value Date/Time   NA 131 01/18/2015 0933   NA 140 12/20/2014 1037   NA 140 09/26/2014 1032   K 4.2 01/18/2015 0933   K 4.5 12/20/2014 1037   K 4.1 09/26/2014 1032   CL 98 01/18/2015 0933   CL 103 12/20/2014 1037   CO2 27 01/18/2015 0933   CO2 25 12/20/2014 1037   CO2 26 09/26/2014 1032   BUN 12 01/18/2015 0933   BUN 11 12/20/2014 1037   BUN 7.8 09/26/2014 1032   CREATININE 1.0 01/18/2015 0933   CREATININE 1.02 11/23/2014 0946   CREATININE 0.8 09/26/2014 1032      Component Value Date/Time   CALCIUM 9.6 01/18/2015 0933   CALCIUM 9.5 12/20/2014 1037   CALCIUM 8.9 09/26/2014 1032   ALKPHOS  70 01/18/2015 0933   ALKPHOS 68 12/20/2014 1037   ALKPHOS 76 09/26/2014 1032   AST 19 01/18/2015 0933   AST 12 12/20/2014 1037   AST 21 09/26/2014 1032   ALT 14 01/18/2015 0933   ALT 9 12/20/2014 1037   ALT 30 09/26/2014 1032   BILITOT 0.60 01/18/2015 0933   BILITOT 0.5 12/20/2014 1037   BILITOT <0.20 09/26/2014 1032         Impression and Plan: Mr. Sheahan is a 39 year old African American gentleman.     I'm glad to see that he is responding. The Kaposi's lesions are definitely less prominent and less  pigmented.  I think t we can move his appointment solitary 3 weeks now. I think this would be very reasonable.   We will go ahead and give him set up another echocardiogram. I did is we import. His last was in May.           Less [name], MD 9/16/20168:26 AM

## 2015-02-01 NOTE — Patient Instructions (Signed)
Doxorubicin Liposomal injection What is this medicine? LIPOSOMAL DOXORUBICIN (LIP oh som al dox oh ROO bi sin) is a chemotherapy drug. This medicine is used to treat many kinds of cancer like Kaposi's sarcoma, multiple myeloma, and ovarian cancer. This medicine may be used for other purposes; ask your health care provider or pharmacist if you have questions. COMMON BRAND NAME(S): Doxil, Lipodox What should I tell my health care provider before I take this medicine? They need to know if you have any of these conditions: -blood disorders -heart disease -infection (especially a virus infection such as chickenpox, cold sores, or herpes) -liver disease -recent or ongoing radiation therapy -an unusual or allergic reaction to doxorubicin, other chemotherapy agents, soybeans, other medicines, foods, dyes, or preservatives -pregnant or trying to get pregnant -breast-feeding How should I use this medicine? This drug is given as an infusion into a vein. It is administered in a hospital or clinic by a specially trained health care professional. If you have pain, swelling, burning or any unusual feeling around the site of your injection, tell your health care professional right away. Talk to your pediatrician regarding the use of this medicine in children. Special care may be needed. Overdosage: If you think you have taken too much of this medicine contact a poison control center or emergency room at once. NOTE: This medicine is only for you. Do not share this medicine with others. What if I miss a dose? It is important not to miss your dose. Call your doctor or health care professional if you are unable to keep an appointment. What may interact with this medicine? Do not take this medicine with any of the following medications: -zidovudine This medicine may also interact with the following medications: -medicines to increase blood counts like filgrastim, pegfilgrastim, sargramostim -vaccines Talk to  your doctor or health care professional before taking any of these medicines: -acetaminophen -aspirin -ibuprofen -ketoprofen -naproxen This list may not describe all possible interactions. Give your health care provider a list of all the medicines, herbs, non-prescription drugs, or dietary supplements you use. Also tell them if you smoke, drink alcohol, or use illegal drugs. Some items may interact with your medicine. What should I watch for while using this medicine? Your condition will be monitored carefully while you are receiving this medicine. You will need important blood work done while you are taking this medicine. This drug may make you feel generally unwell. This is not uncommon, as chemotherapy can affect healthy cells as well as cancer cells. Report any side effects. Continue your course of treatment even though you feel ill unless your doctor tells you to stop. Your urine may turn orange-red for a few days after your dose. This is not blood. If your urine is dark or brown, call your doctor. In some cases, you may be given additional medicines to help with side effects. Follow all directions for their use. Call your doctor or health care professional for advice if you get a fever (100.5 degrees F or higher), chills or sore throat, or other symptoms of a cold or flu. Do not treat yourself. This drug decreases your body's ability to fight infections. Try to avoid being around people who are sick. This medicine may increase your risk to bruise or bleed. Call your doctor or health care professional if you notice any unusual bleeding. Be careful brushing and flossing your teeth or using a toothpick because you may get an infection or bleed more easily. If you have any dental  work done, tell your dentist you are receiving this medicine. Avoid taking products that contain aspirin, acetaminophen, ibuprofen, naproxen, or ketoprofen unless instructed by your doctor. These medicines may hide a  fever. Men and women of childbearing age should use effective birth control methods while using taking this medicine. Do not become pregnant while taking this medicine. There is a potential for serious side effects to an unborn child. Talk to your health care professional or pharmacist for more information. Do not breast-feed an infant while taking this medicine. Talk to your doctor about your risk of cancer. You may be more at risk for certain types of cancers if you take this medicine. What side effects may I notice from receiving this medicine? Side effects that you should report to your doctor or health care professional as soon as possible: -allergic reactions like skin rash, itching or hives, swelling of the face, lips, or tongue -low blood counts - this medicine may decrease the number of white blood cells, red blood cells and platelets. You may be at increased risk for infections and bleeding. -signs of hand-foot syndrome - tingling or burning, redness, flaking, swelling, small blisters, or small sores on the palms of your hands or the soles of your feet -signs of infection - fever or chills, cough, sore throat, pain or difficulty passing urine -signs of decreased platelets or bleeding - bruising, pinpoint red spots on the skin, black, tarry stools, blood in the urine -signs of decreased red blood cells - unusually weak or tired, fainting spells, lightheadedness -back pain, chills, facial flushing, fever, headache, tightness in the chest or throat during the infusion -breathing problems -chest pain -fast, irregular heartbeat -mouth pain, redness, sores -pain, swelling, redness at site where injected -pain, tingling, numbness in the hands or feet -swelling of ankles, feet, or hands -vomiting Side effects that usually do not require medical attention (report to your doctor or health care professional if they continue or are bothersome): -diarrhea -hair loss -loss of appetite -nail  discoloration or damage -nausea -red or watery eyes -red colored urine -stomach upset This list may not describe all possible side effects. Call your doctor for medical advice about side effects. You may report side effects to FDA at 1-800-FDA-1088. Where should I keep my medicine? This drug is given in a hospital or clinic and will not be stored at home. NOTE: This sheet is a summary. It may not cover all possible information. If you have questions about this medicine, talk to your doctor, pharmacist, or health care provider.  2015, [employer]vier/Gold Standard. (2012-01-22 10:12:56)  

## 2015-02-04 ENCOUNTER — Telehealth: Payer: Self-pay | Admitting: Hematology & Oncology

## 2015-02-04 NOTE — Telephone Encounter (Signed)
Called Patient's cell and l/m. Stating patient's upcoming appts. Sent calendar by mail.       [facility].

## 2015-02-07 ENCOUNTER — Telehealth: Payer: Self-pay | Admitting: Hematology & Oncology

## 2015-02-07 NOTE — Telephone Encounter (Signed)
Faxed [place] form to:   F: [place] faxed on 02/06/2015       COPY SCANNED

## 2015-02-20 ENCOUNTER — Ambulatory Visit (HOSPITAL_BASED_OUTPATIENT_CLINIC_OR_DEPARTMENT_OTHER)
Admission: RE | Admit: 2015-02-20 | Discharge: 2015-02-20 | Disposition: A | Discharge status: Home / Self Care | Payer: Medicaid Other | Source: Ambulatory Visit | Attending: Hematology & Oncology | Admitting: Hematology & Oncology

## 2015-02-20 DIAGNOSIS — C469 Kaposi's sarcoma, unspecified: Secondary | ICD-10-CM | POA: Insufficient documentation

## 2015-02-20 NOTE — Progress Notes (Signed)
  Echocardiogram Limited 2D Echocardiogram has been performed.  Todd Vincent M 02/20/2015, 9:07 AM

## 2015-02-21 ENCOUNTER — Telehealth: Payer: Self-pay | Admitting: *Deleted

## 2015-02-21 NOTE — Telephone Encounter (Addendum)
Patient aware of results  ----- Message from [name], MD sent at 02/20/2015  4:50 PM EDT ----- Call - heart is  Pumping like a champ!!!  Laurey Arrow

## 2015-02-22 ENCOUNTER — Ambulatory Visit (HOSPITAL_BASED_OUTPATIENT_CLINIC_OR_DEPARTMENT_OTHER): Payer: Medicaid Other

## 2015-02-22 ENCOUNTER — Other Ambulatory Visit (HOSPITAL_BASED_OUTPATIENT_CLINIC_OR_DEPARTMENT_OTHER): Payer: Medicaid Other

## 2015-02-22 ENCOUNTER — Encounter: Payer: Self-pay | Admitting: Hematology & Oncology

## 2015-02-22 ENCOUNTER — Other Ambulatory Visit: Payer: Self-pay | Admitting: Hematology & Oncology

## 2015-02-22 ENCOUNTER — Ambulatory Visit (HOSPITAL_BASED_OUTPATIENT_CLINIC_OR_DEPARTMENT_OTHER): Payer: Medicaid Other | Admitting: Family

## 2015-02-22 VITALS — BP 120/83 | HR 80 | Temp 98.0°F | Resp 16 | Ht 67.0 in | Wt 149.0 lb

## 2015-02-22 DIAGNOSIS — C469 Kaposi's sarcoma, unspecified: Secondary | ICD-10-CM

## 2015-02-22 DIAGNOSIS — Z5111 Encounter for antineoplastic chemotherapy: Secondary | ICD-10-CM

## 2015-02-22 DIAGNOSIS — B20 Human immunodeficiency virus [HIV] disease: Secondary | ICD-10-CM | POA: Diagnosis not present

## 2015-02-22 LAB — CMP (CANCER CENTER ONLY)
ALT(SGPT): 14 U/L (ref 10–47)
AST: 20 U/L (ref 11–38)
Albumin: 3.6 g/dL (ref 3.3–5.5)
Alkaline Phosphatase: 69 U/L (ref 26–84)
BUN: 10 mg/dL (ref 7–22)
CHLORIDE: 100 meq/L (ref 98–108)
CO2: 27 meq/L (ref 18–33)
Calcium: 9.4 mg/dL (ref 8.0–10.3)
Creat: 1 mg/dl (ref 0.6–1.2)
GLUCOSE: 107 mg/dL (ref 73–118)
POTASSIUM: 3.9 meq/L (ref 3.3–4.7)
Sodium: 137 mEq/L (ref 128–145)
Total Bilirubin: 0.8 mg/dl (ref 0.20–1.60)
Total Protein: 7.1 g/dL (ref 6.4–8.1)

## 2015-02-22 LAB — CBC WITH DIFFERENTIAL (CANCER CENTER ONLY)
BASO#: 0 10*3/uL (ref 0.0–0.2)
BASO%: 0.3 % (ref 0.0–2.0)
EOS%: 4.7 % (ref 0.0–7.0)
Eosinophils Absolute: 0.3 10*3/uL (ref 0.0–0.5)
HEMATOCRIT: 43.4 % (ref 38.7–49.9)
HGB: 15 g/dL (ref 13.0–17.1)
LYMPH#: 2 10*3/uL (ref 0.9–3.3)
LYMPH%: 33.7 % (ref 14.0–48.0)
MCH: 31 pg (ref 28.0–33.4)
MCHC: 34.6 g/dL (ref 32.0–35.9)
MCV: 90 fL (ref 82–98)
MONO#: 0.6 10*3/uL (ref 0.1–0.9)
MONO%: 10.6 % (ref 0.0–13.0)
NEUT%: 50.7 % (ref 40.0–80.0)
NEUTROS ABS: 3.1 10*3/uL (ref 1.5–6.5)
Platelets: 227 10*3/uL (ref 145–400)
RBC: 4.84 10*6/uL (ref 4.20–5.70)
RDW: 15.3 % (ref 11.1–15.7)
WBC: 6 10*3/uL (ref 4.0–10.0)

## 2015-02-22 LAB — LACTATE DEHYDROGENASE: LDH: 171 U/L (ref 94–250)

## 2015-02-22 MED ORDER — SODIUM CHLORIDE 0.9 % IV SOLN
Freq: Once | INTRAVENOUS | Status: DC
  Administered 2015-02-22: 12:00:00 via INTRAVENOUS

## 2015-02-22 MED ORDER — SODIUM CHLORIDE 0.9 % IV SOLN
Freq: Once | INTRAVENOUS | Status: AC
  Administered 2015-02-22: 13:00:00 via INTRAVENOUS
  Filled 2015-02-22: qty 4

## 2015-02-22 MED ORDER — DOXORUBICIN HCL LIPOSOMAL CHEMO INJECTION 2 MG/ML
20.0000 mg/m2 | Freq: Once | INTRAVENOUS | Status: AC
  Administered 2015-02-22: 36 mg via INTRAVENOUS
  Filled 2015-02-22: qty 18

## 2015-02-22 MED ORDER — DEXTROSE 5 % IV SOLN
Freq: Once | INTRAVENOUS | Status: AC
  Administered 2015-02-22: 14:00:00 via INTRAVENOUS

## 2015-02-22 NOTE — Progress Notes (Signed)
Hematology and Oncology Follow Up Visit  CAYNE YOM [id number] 1975-12-15 39 y.o. 02/22/2015   Principle Diagnosis:  Kaposi's sarcoma - progressive HIV-asymptomatic  Current Therapy:   Doxil- q 3-week dosing    Interim History:  Mr. Mish is here today for a follow-up. He is doing well. His only complaint at this time is numbness and tingling in his hands and feet at times. This is not a new issue for him.  His eating well and drinking 4 boost a day. His weight is up 1 more lb today. He is staying hydrated.  He will likely have his colostomy reversal in March of next year. He was given the option to do it December but decided to wait.  He has some hyperpigmentation from where he had the sarcoma lesions. These areas continue to fade and he has had no new lesions appear.  He denies fever, chills, n/v, cough, SOB, chest pain, palpitations, abdominal pain or changes in bowel or bladder habits. No blood in his stool.  He has done well on Maxzide. No swelling or tenderness of the extremities.  His Echo this week showed an EF of 50-55%.   Medications:    Medication List       This list is accurate as of: 02/22/15 11:39 AM.  Always use your most recent med list.               acyclovir 800 MG tablet  Commonly known as:  ZOVIRAX  Take 800 mg by mouth 5 (five) times daily.     ascorbic acid 500 MG tablet  Commonly known as:  VITAMIN C  Take by mouth.     citalopram 10 MG tablet  Commonly known as:  CELEXA  TAKE 1 TABLET BY MOUTH DAILY     COCONUT OIL PO  Take 1,000 mg by mouth 2 (two) times daily.     Darunavir Ethanolate 800 MG tablet  Commonly known as:  PREZISTA  TAKE 1 TABLET BY MOUTH EVERY DAY WITH BREAKFAST     dronabinol 5 MG capsule  Commonly known as:  MARINOL  Take 1 capsule (5 mg total) by mouth 2 (two) times daily before a meal.     elvitegravir-cobicistat-emtricitabine-tenofovir 150-150-200-10 MG Tabs tablet  Commonly known as:  GENVOYA  Take 1 tablet  by mouth daily.     ferrous sulfate 325 (65 FE) MG EC tablet  Take by mouth.     gabapentin 300 MG capsule  Commonly known as:  NEURONTIN  Take 1 capsule (300 mg total) by mouth 3 (three) times daily.     LORazepam 0.5 MG tablet  Commonly known as:  ATIVAN  Take 1 tablet (0.5 mg total) by mouth every 6 (six) hours as needed (Nausea or vomiting).     meclizine 25 MG tablet  Commonly known as:  ANTIVERT  TAKE 1 TABLET BY MOUTH EVERY 6 HOURS AS NEEDED     ondansetron 8 MG tablet  Commonly known as:  ZOFRAN  Take 1 tablet (8 mg total) by mouth 2 (two) times daily. Start the day after chemo for 2 days. Then take as needed for nausea or vomiting.     traZODone 100 MG tablet  Commonly known as:  DESYREL  Take 1 tablet (100 mg total) by mouth at bedtime.     triamterene-hydrochlorothiazide 37.5-25 MG tablet  Commonly known as:  MAXZIDE-25  Take 1 tablet daily if needed for leg swelling     Vitamin D3 2000 UNITS  Tabs  Take 2,000 Units by mouth every morning. 2 tabs = 4,000 U daily        Allergies:  Allergies  Allergen Reactions  . Sulfa Antibiotics Rash    Extreme itching     Past Medical History, Surgical history, Social history, and Family History were reviewed and updated.  Review of Systems: All other 10 point review of systems is negative.   Physical Exam:  height is 5\' 7"  (1.702 m) and weight is 149 lb (67.586 kg). His oral temperature is 98 F (36.7 C). His blood pressure is 120/83 and his pulse is 80. His respiration is 16.   Wt Readings from Last 3 Encounters:  02/22/15 149 lb (67.586 kg)  02/01/15 148 lb (67.132 kg)  01/31/15 144 lb (65.318 kg)    Ocular: Sclerae unicteric, pupils equal, round and reactive to light Ear-nose-throat: Oropharynx clear, dentition fair Lymphatic: No cervical or supraclavicular adenopathy Lungs no rales or rhonchi, good excursion bilaterally Heart regular rate and rhythm, no murmur appreciated Abd soft, nontender, positive  bowel sounds MSK no focal spinal tenderness, no joint edema Neuro: non-focal, well-oriented, appropriate affect  Lab Results  Component Value Date   WBC 6.0 02/22/2015   HGB 15.0 02/22/2015   HCT 43.4 02/22/2015   MCV 90 02/22/2015   PLT 227 02/22/2015   No results found for: FERRITIN, IRON, TIBC, UIBC, IRONPCTSAT Lab Results  Component Value Date   RBC 4.84 02/22/2015   No results found for: KPAFRELGTCHN, LAMBDASER, KAPLAMBRATIO No results found for: IGGSERUM, IGA, IGMSERUM No results found for: Odetta Pink, SPEI   Chemistry      Component Value Date/Time   NA 137 02/22/2015 1057   NA 140 12/20/2014 1037   NA 140 09/26/2014 1032   K 3.9 02/22/2015 1057   K 4.5 12/20/2014 1037   K 4.1 09/26/2014 1032   CL 100 02/22/2015 1057   CL 103 12/20/2014 1037   CO2 27 02/22/2015 1057   CO2 25 12/20/2014 1037   CO2 26 09/26/2014 1032   BUN 10 02/22/2015 1057   BUN 11 12/20/2014 1037   BUN 7.8 09/26/2014 1032   CREATININE 1.0 02/22/2015 1057   CREATININE 1.02 11/23/2014 0946   CREATININE 0.8 09/26/2014 1032      Component Value Date/Time   CALCIUM 9.4 02/22/2015 1057   CALCIUM 9.5 12/20/2014 1037   CALCIUM 8.9 09/26/2014 1032   ALKPHOS 69 02/22/2015 1057   ALKPHOS 68 12/20/2014 1037   ALKPHOS 76 09/26/2014 1032   AST 20 02/22/2015 1057   AST 12 12/20/2014 1037   AST 21 09/26/2014 1032   ALT 14 02/22/2015 1057   ALT 9 12/20/2014 1037   ALT 30 09/26/2014 1032   BILITOT 0.80 02/22/2015 1057   BILITOT 0.5 12/20/2014 1037   BILITOT <0.20 09/26/2014 1032     Impression and Plan: Mr. Mounce is 39 year old African American gentleman with Kaposi's sarcoma. He has responded nicely to the Doxil. His Kaposi's lesions have resolved and the hyperpigmented areas continue to fade.  At this point his HIV is undetectable.  He still has his ileostomy at this time with the goal of having this reversed in March of next year.  His ECHO  this week showed an EF of 50-55%. We will plan to see him back in 3 weeks for labs and follow-up. He will continue his Doxil infusions every 3 weeks.  He will contact us with any questions or concerns. We can  certainly see him sooner if need be.   Eliezer Bottom, NP 10/7/201611:39 AM

## 2015-02-22 NOTE — Patient Instructions (Signed)
Doxorubicin Liposomal injection What is this medicine? LIPOSOMAL DOXORUBICIN (LIP oh som al dox oh ROO bi sin) is a chemotherapy drug. This medicine is used to treat many kinds of cancer like Kaposi's sarcoma, multiple myeloma, and ovarian cancer. This medicine may be used for other purposes; ask your health care provider or pharmacist if you have questions. COMMON BRAND NAME(S): Doxil, Lipodox What should I tell my health care provider before I take this medicine? They need to know if you have any of these conditions: -blood disorders -heart disease -infection (especially a virus infection such as chickenpox, cold sores, or herpes) -liver disease -recent or ongoing radiation therapy -an unusual or allergic reaction to doxorubicin, other chemotherapy agents, soybeans, other medicines, foods, dyes, or preservatives -pregnant or trying to get pregnant -breast-feeding How should I use this medicine? This drug is given as an infusion into a vein. It is administered in a hospital or clinic by a specially trained health care professional. If you have pain, swelling, burning or any unusual feeling around the site of your injection, tell your health care professional right away. Talk to your pediatrician regarding the use of this medicine in children. Special care may be needed. Overdosage: If you think you have taken too much of this medicine contact a poison control center or emergency room at once. NOTE: This medicine is only for you. Do not share this medicine with others. What if I miss a dose? It is important not to miss your dose. Call your doctor or health care professional if you are unable to keep an appointment. What may interact with this medicine? Do not take this medicine with any of the following medications: -zidovudine This medicine may also interact with the following medications: -medicines to increase blood counts like filgrastim, pegfilgrastim, sargramostim -vaccines Talk to  your doctor or health care professional before taking any of these medicines: -acetaminophen -aspirin -ibuprofen -ketoprofen -naproxen This list may not describe all possible interactions. Give your health care provider a list of all the medicines, herbs, non-prescription drugs, or dietary supplements you use. Also tell them if you smoke, drink alcohol, or use illegal drugs. Some items may interact with your medicine. What should I watch for while using this medicine? Your condition will be monitored carefully while you are receiving this medicine. You will need important blood work done while you are taking this medicine. This drug may make you feel generally unwell. This is not uncommon, as chemotherapy can affect healthy cells as well as cancer cells. Report any side effects. Continue your course of treatment even though you feel ill unless your doctor tells you to stop. Your urine may turn orange-red for a few days after your dose. This is not blood. If your urine is dark or brown, call your doctor. In some cases, you may be given additional medicines to help with side effects. Follow all directions for their use. Call your doctor or health care professional for advice if you get a fever (100.5 degrees F or higher), chills or sore throat, or other symptoms of a cold or flu. Do not treat yourself. This drug decreases your body's ability to fight infections. Try to avoid being around people who are sick. This medicine may increase your risk to bruise or bleed. Call your doctor or health care professional if you notice any unusual bleeding. Be careful brushing and flossing your teeth or using a toothpick because you may get an infection or bleed more easily. If you have any dental  work done, tell your dentist you are receiving this medicine. Avoid taking products that contain aspirin, acetaminophen, ibuprofen, naproxen, or ketoprofen unless instructed by your doctor. These medicines may hide a  fever. Men and women of childbearing age should use effective birth control methods while using taking this medicine. Do not become pregnant while taking this medicine. There is a potential for serious side effects to an unborn child. Talk to your health care professional or pharmacist for more information. Do not breast-feed an infant while taking this medicine. Talk to your doctor about your risk of cancer. You may be more at risk for certain types of cancers if you take this medicine. What side effects may I notice from receiving this medicine? Side effects that you should report to your doctor or health care professional as soon as possible: -allergic reactions like skin rash, itching or hives, swelling of the face, lips, or tongue -low blood counts - this medicine may decrease the number of white blood cells, red blood cells and platelets. You may be at increased risk for infections and bleeding. -signs of hand-foot syndrome - tingling or burning, redness, flaking, swelling, small blisters, or small sores on the palms of your hands or the soles of your feet -signs of infection - fever or chills, cough, sore throat, pain or difficulty passing urine -signs of decreased platelets or bleeding - bruising, pinpoint red spots on the skin, black, tarry stools, blood in the urine -signs of decreased red blood cells - unusually weak or tired, fainting spells, lightheadedness -back pain, chills, facial flushing, fever, headache, tightness in the chest or throat during the infusion -breathing problems -chest pain -fast, irregular heartbeat -mouth pain, redness, sores -pain, swelling, redness at site where injected -pain, tingling, numbness in the hands or feet -swelling of ankles, feet, or hands -vomiting Side effects that usually do not require medical attention (report to your doctor or health care professional if they continue or are bothersome): -diarrhea -hair loss -loss of appetite -nail  discoloration or damage -nausea -red or watery eyes -red colored urine -stomach upset This list may not describe all possible side effects. Call your doctor for medical advice about side effects. You may report side effects to FDA at 1-800-FDA-1088. Where should I keep my medicine? This drug is given in a hospital or clinic and will not be stored at home. NOTE: This sheet is a summary. It may not cover all possible information. If you have questions about this medicine, talk to your doctor, pharmacist, or health care provider.  2015, [employer]vier/Gold Standard. (2012-01-22 10:12:56)  

## 2015-02-28 ENCOUNTER — Other Ambulatory Visit: Payer: Self-pay | Admitting: Internal Medicine

## 2015-02-28 DIAGNOSIS — F329 Major depressive disorder, single episode, unspecified: Secondary | ICD-10-CM

## 2015-02-28 DIAGNOSIS — F32A Depression, unspecified: Secondary | ICD-10-CM

## 2015-03-15 ENCOUNTER — Other Ambulatory Visit (HOSPITAL_BASED_OUTPATIENT_CLINIC_OR_DEPARTMENT_OTHER): Payer: Medicaid Other

## 2015-03-15 ENCOUNTER — Encounter: Payer: Self-pay | Admitting: Family

## 2015-03-15 ENCOUNTER — Ambulatory Visit (HOSPITAL_BASED_OUTPATIENT_CLINIC_OR_DEPARTMENT_OTHER): Payer: Medicaid Other

## 2015-03-15 ENCOUNTER — Ambulatory Visit (HOSPITAL_BASED_OUTPATIENT_CLINIC_OR_DEPARTMENT_OTHER): Payer: Medicaid Other | Admitting: Family

## 2015-03-15 VITALS — BP 114/72 | HR 91 | Temp 98.0°F | Resp 16 | Ht 67.0 in | Wt 150.0 lb

## 2015-03-15 DIAGNOSIS — B20 Human immunodeficiency virus [HIV] disease: Secondary | ICD-10-CM | POA: Diagnosis not present

## 2015-03-15 DIAGNOSIS — Z5111 Encounter for antineoplastic chemotherapy: Secondary | ICD-10-CM | POA: Diagnosis not present

## 2015-03-15 DIAGNOSIS — C469 Kaposi's sarcoma, unspecified: Secondary | ICD-10-CM

## 2015-03-15 LAB — COMPREHENSIVE METABOLIC PANEL (CC13)
ALT: 21 U/L (ref 0–55)
AST: 21 U/L (ref 5–34)
Albumin: 3.9 g/dL (ref 3.5–5.0)
Alkaline Phosphatase: 80 U/L (ref 40–150)
Anion Gap: 8 mEq/L (ref 3–11)
BUN: 10 mg/dL (ref 7.0–26.0)
CHLORIDE: 107 meq/L (ref 98–109)
CO2: 24 meq/L (ref 22–29)
Calcium: 9.4 mg/dL (ref 8.4–10.4)
Creatinine: 0.9 mg/dL (ref 0.7–1.3)
EGFR: >90 mL/min/{1.73_m2} (ref >90)
GLUCOSE: 95 mg/dL (ref 70–140)
POTASSIUM: 3.8 meq/L (ref 3.5–5.1)
SODIUM: 139 meq/L (ref 136–145)
TOTAL PROTEIN: 7.4 g/dL (ref 6.4–8.3)
Total Bilirubin: 0.44 mg/dL (ref 0.20–1.20)

## 2015-03-15 LAB — CBC WITH DIFFERENTIAL (CANCER CENTER ONLY)
BASO#: 0 10*3/uL (ref 0.0–0.2)
BASO%: 0.3 % (ref 0.0–2.0)
EOS%: 6.1 % (ref 0.0–7.0)
Eosinophils Absolute: 0.4 10*3/uL (ref 0.0–0.5)
HCT: 44.8 % (ref 38.7–49.9)
HEMOGLOBIN: 15.6 g/dL (ref 13.0–17.1)
LYMPH#: 2.1 10*3/uL (ref 0.9–3.3)
LYMPH%: 31.6 % (ref 14.0–48.0)
MCH: 31.4 pg (ref 28.0–33.4)
MCHC: 34.8 g/dL (ref 32.0–35.9)
MCV: 90 fL (ref 82–98)
MONO#: 0.7 10*3/uL (ref 0.1–0.9)
MONO%: 10.3 % (ref 0.0–13.0)
NEUT%: 51.7 % (ref 40.0–80.0)
NEUTROS ABS: 3.4 10*3/uL (ref 1.5–6.5)
Platelets: 246 10*3/uL (ref 145–400)
RBC: 4.97 10*6/uL (ref 4.20–5.70)
RDW: 14.8 % (ref 11.1–15.7)
WBC: 6.6 10*3/uL (ref 4.0–10.0)

## 2015-03-15 LAB — LACTATE DEHYDROGENASE: LDH: 167 U/L (ref 94–250)

## 2015-03-15 MED ORDER — DOXORUBICIN HCL LIPOSOMAL CHEMO INJECTION 2 MG/ML
20.0000 mg/m2 | Freq: Once | INTRAVENOUS | Status: AC
  Administered 2015-03-15: 36 mg via INTRAVENOUS
  Filled 2015-03-15: qty 18

## 2015-03-15 MED ORDER — SODIUM CHLORIDE 0.9 % IV SOLN
Freq: Once | INTRAVENOUS | Status: AC
  Administered 2015-03-15: 10:00:00 via INTRAVENOUS
  Filled 2015-03-15: qty 4

## 2015-03-15 MED ORDER — DEXTROSE 5 % IV SOLN
Freq: Once | INTRAVENOUS | Status: AC
  Administered 2015-03-15: 10:00:00 via INTRAVENOUS

## 2015-03-15 NOTE — Patient Instructions (Signed)
Doxorubicin Liposomal injection  What is this medicine?  LIPOSOMAL DOXORUBICIN (LIP oh som al dox oh ROO bi sin) is a chemotherapy drug. This medicine is used to treat many kinds of cancer like Kaposi's sarcoma, multiple myeloma, and ovarian cancer.  This medicine may be used for other purposes; ask your health care provider or pharmacist if you have questions.  What should I tell my health care provider before I take this medicine?  They need to know if you have any of these conditions:  -blood disorders  -heart disease  -infection (especially a virus infection such as chickenpox, cold sores, or herpes)  -liver disease  -recent or ongoing radiation therapy  -an unusual or allergic reaction to doxorubicin, other chemotherapy agents, soybeans, other medicines, foods, dyes, or preservatives  -pregnant or trying to get pregnant  -breast-feeding  How should I use this medicine?  This drug is given as an infusion into a vein. It is administered in a hospital or clinic by a specially trained health care professional. If you have pain, swelling, burning or any unusual feeling around the site of your injection, tell your health care professional right away.  Talk to your pediatrician regarding the use of this medicine in children. Special care may be needed.  Overdosage: If you think you have taken too much of this medicine contact a poison control center or emergency room at once.  NOTE: This medicine is only for you. Do not share this medicine with others.  What if I miss a dose?  It is important not to miss your dose. Call your doctor or health care professional if you are unable to keep an appointment.  What may interact with this medicine?  Do not take this medicine with any of the following medications:  -zidovudine  This medicine may also interact with the following medications:  -medicines to increase blood counts like filgrastim, pegfilgrastim, sargramostim  -vaccines  Talk to your doctor or health care  professional before taking any of these medicines:  -acetaminophen  -aspirin  -ibuprofen  -ketoprofen  -naproxen  This list may not describe all possible interactions. Give your health care provider a list of all the medicines, herbs, non-prescription drugs, or dietary supplements you use. Also tell them if you smoke, drink alcohol, or use illegal drugs. Some items may interact with your medicine.  What should I watch for while using this medicine?  Your condition will be monitored carefully while you are receiving this medicine. You will need important blood work done while you are taking this medicine.  This drug may make you feel generally unwell. This is not uncommon, as chemotherapy can affect healthy cells as well as cancer cells. Report any side effects. Continue your course of treatment even though you feel ill unless your doctor tells you to stop.  Your urine may turn orange-red for a few days after your dose. This is not blood. If your urine is dark or brown, call your doctor.  In some cases, you may be given additional medicines to help with side effects. Follow all directions for their use.  Call your doctor or health care professional for advice if you get a fever (100.5 degrees F or higher), chills or sore throat, or other symptoms of a cold or flu. Do not treat yourself. This drug decreases your body's ability to fight infections. Try to avoid being around people who are sick.  This medicine may increase your risk to bruise or bleed. Call your doctor or   you are receiving this medicine. Avoid taking products that contain aspirin, acetaminophen, ibuprofen, naproxen, or ketoprofen unless instructed by your doctor. These medicines may hide a fever. Men and women of  childbearing age should use effective birth control methods while using taking this medicine. Do not become pregnant while taking this medicine. There is a potential for serious side effects to an unborn child. Talk to your health care professional or pharmacist for more information. Do not breast-feed an infant while taking this medicine. Talk to your doctor about your risk of cancer. You may be more at risk for certain types of cancers if you take this medicine. What side effects may I notice from receiving this medicine? Side effects that you should report to your doctor or health care professional as soon as possible: -allergic reactions like skin rash, itching or hives, swelling of the face, lips, or tongue -low blood counts - this medicine may decrease the number of white blood cells, red blood cells and platelets. You may be at increased risk for infections and bleeding. -signs of hand-foot syndrome - tingling or burning, redness, flaking, swelling, small blisters, or small sores on the palms of your hands or the soles of your feet -signs of infection - fever or chills, cough, sore throat, pain or difficulty passing urine -signs of decreased platelets or bleeding - bruising, pinpoint red spots on the skin, black, tarry stools, blood in the urine -signs of decreased red blood cells - unusually weak or tired, fainting spells, lightheadedness -back pain, chills, facial flushing, fever, headache, tightness in the chest or throat during the infusion -breathing problems -chest pain -fast, irregular heartbeat -mouth pain, redness, sores -pain, swelling, redness at site where injected -pain, tingling, numbness in the hands or feet -swelling of ankles, feet, or hands -vomiting Side effects that usually do not require medical attention (report to your doctor or health care professional if they continue or are bothersome): -diarrhea -hair loss -loss of appetite -nail discoloration or  damage -nausea -red or watery eyes -red colored urine -stomach upset This list may not describe all possible side effects. Call your doctor for medical advice about side effects. You may report side effects to FDA at 1-800-FDA-1088. Where should I keep my medicine? This drug is given in a hospital or clinic and will not be stored at home. NOTE: This sheet is a summary. It may not cover all possible information. If you have questions about this medicine, talk to your doctor, pharmacist, or health care provider.    2016, [employer]. (2012-01-22 10:12:56)

## 2015-03-15 NOTE — Progress Notes (Signed)
Hematology and Oncology Follow Up Visit  ZACKRY DEINES [id number] 05-24-1975 39 y.o. 03/15/2015   Principle Diagnosis:  Kaposi's sarcoma - progressive HIV-asymptomatic  Current Therapy:   Doxil- q 3-week dosing    Interim History:  Mr. Cochrane is here today for a follow-up. He continues to do well. His lesions have almost completely faded away. No new areas.  He denies fever, chills, n/v, cough, SOB, chest pain, palpitations, abdominal pain or changes in bowel or bladder habits. No blood in his stool.  No swelling or tenderness in his extremities at this time. 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  He has had no problems with his temporary ostomy. He is still hoping to have this reversed in March 2017.  He is eating well and staying hydrated. He is still supplementing Boost between meals and has gained 1 lb since his last appointment.  His Echo this week showed an EF of 50-55%.   Medications:    Medication List       This list is accurate as of: 03/15/15  8:44 AM.  Always use your most recent med list.               acyclovir 800 MG tablet  Commonly known as:  ZOVIRAX  Take 800 mg by mouth 5 (five) times daily.     ascorbic acid 500 MG tablet  Commonly known as:  VITAMIN C  Take by mouth.     citalopram 10 MG tablet  Commonly known as:  CELEXA  TAKE 1 TABLET BY MOUTH DAILY     COCONUT OIL PO  Take 1,000 mg by mouth 2 (two) times daily.     Darunavir Ethanolate 800 MG tablet  Commonly known as:  PREZISTA  TAKE 1 TABLET BY MOUTH EVERY DAY WITH BREAKFAST     dronabinol 5 MG capsule  Commonly known as:  MARINOL  Take 1 capsule (5 mg total) by mouth 2 (two) times daily before a meal.     elvitegravir-cobicistat-emtricitabine-tenofovir 150-150-200-10 MG Tabs tablet  Commonly known as:  GENVOYA  Take 1 tablet by mouth daily.     ferrous sulfate 325 (65 FE) MG EC tablet  Take by mouth.     gabapentin 300 MG capsule  Commonly known as:   NEURONTIN  Take 1 capsule (300 mg total) by mouth 3 (three) times daily.     LORazepam 0.5 MG tablet  Commonly known as:  ATIVAN  Take 1 tablet (0.5 mg total) by mouth every 6 (six) hours as needed (Nausea or vomiting).     meclizine 25 MG tablet  Commonly known as:  ANTIVERT  TAKE 1 TABLET BY MOUTH EVERY 6 HOURS AS NEEDED     ondansetron 8 MG tablet  Commonly known as:  ZOFRAN  Take 1 tablet (8 mg total) by mouth 2 (two) times daily. Start the day after chemo for 2 days. Then take as needed for nausea or vomiting.     traZODone 100 MG tablet  Commonly known as:  DESYREL  Take 1 tablet (100 mg total) by mouth at bedtime.     triamterene-hydrochlorothiazide 37.5-25 MG tablet  Commonly known as:  MAXZIDE-25  Take 1 tablet daily if needed for leg swelling     Vitamin D3 2000 UNITS Tabs  Take 2,000 Units by mouth every morning. 2 tabs = 4,000 U daily        Allergies:  Allergies  Allergen Reactions  . Sulfa Antibiotics  Rash    Extreme itching     Past Medical History, Surgical history, Social history, and Family History were reviewed and updated.  Review of Systems: All other 10 point review of systems is negative.   Physical Exam:  vitals were not taken for this visit.  Wt Readings from Last 3 Encounters:  02/22/15 149 lb (67.586 kg)  02/01/15 148 lb (67.132 kg)  01/31/15 144 lb (65.318 kg)    Ocular: Sclerae unicteric, pupils equal, round and reactive to light Ear-nose-throat: Oropharynx clear, dentition fair Lymphatic: No cervical or supraclavicular adenopathy Lungs no rales or rhonchi, good excursion bilaterally Heart regular rate and rhythm, no murmur appreciated Abd soft, nontender, positive bowel sounds MSK no focal spinal tenderness, no joint edema Neuro: non-focal, well-oriented, appropriate affect  Lab Results  Component Value Date   WBC 6.0 02/22/2015   HGB 15.0 02/22/2015   HCT 43.4 02/22/2015   MCV 90 02/22/2015   PLT 227 02/22/2015   No  results found for: FERRITIN, IRON, TIBC, UIBC, IRONPCTSAT Lab Results  Component Value Date   RBC 4.84 02/22/2015   No results found for: KPAFRELGTCHN, LAMBDASER, KAPLAMBRATIO No results found for: IGGSERUM, IGA, IGMSERUM No results found for: Odetta Pink, SPEI   Chemistry      Component Value Date/Time   NA 137 02/22/2015 1057   NA 140 12/20/2014 1037   NA 140 09/26/2014 1032   K 3.9 02/22/2015 1057   K 4.5 12/20/2014 1037   K 4.1 09/26/2014 1032   CL 100 02/22/2015 1057   CL 103 12/20/2014 1037   CO2 27 02/22/2015 1057   CO2 25 12/20/2014 1037   CO2 26 09/26/2014 1032   BUN 10 02/22/2015 1057   BUN 11 12/20/2014 1037   BUN 7.8 09/26/2014 1032   CREATININE 1.0 02/22/2015 1057   CREATININE 1.02 11/23/2014 0946   CREATININE 0.8 09/26/2014 1032      Component Value Date/Time   CALCIUM 9.4 02/22/2015 1057   CALCIUM 9.5 12/20/2014 1037   CALCIUM 8.9 09/26/2014 1032   ALKPHOS 69 02/22/2015 1057   ALKPHOS 68 12/20/2014 1037   ALKPHOS 76 09/26/2014 1032   AST 20 02/22/2015 1057   AST 12 12/20/2014 1037   AST 21 09/26/2014 1032   ALT 14 02/22/2015 1057   ALT 9 12/20/2014 1037   ALT 30 09/26/2014 1032   BILITOT 0.80 02/22/2015 1057   BILITOT 0.5 12/20/2014 1037   BILITOT <0.20 09/26/2014 1032     Impression and Plan: Mr. Batiz is 39 year old African American gentleman with Kaposi's sarcoma. He has responded nicely to Doxil every 3 weeks. He has no new lesions and old areas have almost completely faded.  We will plan to see him back in 3 weeks for labs, follow-up and infusion. He will contact us with any questions or concerns. We can certainly see him sooner if need be.   Eliezer Bottom, NP 10/28/20168:44 AM

## 2015-04-05 ENCOUNTER — Other Ambulatory Visit (HOSPITAL_BASED_OUTPATIENT_CLINIC_OR_DEPARTMENT_OTHER): Payer: Medicaid Other

## 2015-04-05 ENCOUNTER — Ambulatory Visit (HOSPITAL_BASED_OUTPATIENT_CLINIC_OR_DEPARTMENT_OTHER): Payer: Medicaid Other

## 2015-04-05 VITALS — BP 107/67 | HR 69 | Temp 98.1°F | Resp 16

## 2015-04-05 DIAGNOSIS — Z5111 Encounter for antineoplastic chemotherapy: Secondary | ICD-10-CM

## 2015-04-05 DIAGNOSIS — C469 Kaposi's sarcoma, unspecified: Secondary | ICD-10-CM

## 2015-04-05 DIAGNOSIS — B20 Human immunodeficiency virus [HIV] disease: Secondary | ICD-10-CM | POA: Diagnosis not present

## 2015-04-05 LAB — CMP (CANCER CENTER ONLY)
ALT: 15 U/L (ref 10–47)
AST: 20 U/L (ref 11–38)
Albumin: 3.3 g/dL (ref 3.3–5.5)
Alkaline Phosphatase: 80 U/L (ref 26–84)
BILIRUBIN TOTAL: 0.7 mg/dL (ref 0.20–1.60)
BUN: 12 mg/dL (ref 7–22)
CHLORIDE: 103 meq/L (ref 98–108)
CO2: 29 mEq/L (ref 18–33)
CREATININE: 1.1 mg/dL (ref 0.6–1.2)
Calcium: 9.4 mg/dL (ref 8.0–10.3)
Glucose, Bld: 86 mg/dL (ref 73–118)
Potassium: 4 mEq/L (ref 3.3–4.7)
SODIUM: 143 meq/L (ref 128–145)
TOTAL PROTEIN: 7 g/dL (ref 6.4–8.1)

## 2015-04-05 LAB — CBC WITH DIFFERENTIAL (CANCER CENTER ONLY)
BASO#: 0 10*3/uL (ref 0.0–0.2)
BASO%: 0.4 % (ref 0.0–2.0)
EOS%: 5.1 % (ref 0.0–7.0)
Eosinophils Absolute: 0.4 10*3/uL (ref 0.0–0.5)
HCT: 42.7 % (ref 38.7–49.9)
HEMOGLOBIN: 14.8 g/dL (ref 13.0–17.1)
LYMPH#: 2.5 10*3/uL (ref 0.9–3.3)
LYMPH%: 34.2 % (ref 14.0–48.0)
MCH: 32.1 pg (ref 28.0–33.4)
MCHC: 34.7 g/dL (ref 32.0–35.9)
MCV: 93 fL (ref 82–98)
MONO#: 0.7 10*3/uL (ref 0.1–0.9)
MONO%: 8.8 % (ref 0.0–13.0)
NEUT%: 51.5 % (ref 40.0–80.0)
NEUTROS ABS: 3.8 10*3/uL (ref 1.5–6.5)
PLATELETS: 243 10*3/uL (ref 145–400)
RBC: 4.61 10*6/uL (ref 4.20–5.70)
RDW: 14.2 % (ref 11.1–15.7)
WBC: 7.4 10*3/uL (ref 4.0–10.0)

## 2015-04-05 LAB — LACTATE DEHYDROGENASE (CC13): LDH: 208 U/L (ref 125–245)

## 2015-04-05 MED ORDER — DOXORUBICIN HCL LIPOSOMAL CHEMO INJECTION 2 MG/ML
20.0000 mg/m2 | Freq: Once | INTRAVENOUS | Status: AC
  Administered 2015-04-05: 36 mg via INTRAVENOUS
  Filled 2015-04-05: qty 18

## 2015-04-05 MED ORDER — DEXTROSE 5 % IV SOLN
Freq: Once | INTRAVENOUS | Status: AC
  Administered 2015-04-05: 09:00:00 via INTRAVENOUS

## 2015-04-05 MED ORDER — SODIUM CHLORIDE 0.9 % IV SOLN
Freq: Once | INTRAVENOUS | Status: AC
  Administered 2015-04-05: 09:00:00 via INTRAVENOUS
  Filled 2015-04-05: qty 4

## 2015-04-05 NOTE — Patient Instructions (Signed)
Doxorubicin Liposomal injection What is this medicine? LIPOSOMAL DOXORUBICIN (LIP oh som al dox oh ROO bi sin) is a chemotherapy drug. This medicine is used to treat many kinds of cancer like Kaposi's sarcoma, multiple myeloma, and ovarian cancer. This medicine may be used for other purposes; ask your health care provider or pharmacist if you have questions. What should I tell my health care provider before I take this medicine? They need to know if you have any of these conditions: -blood disorders -heart disease -infection (especially a virus infection such as chickenpox, cold sores, or herpes) -liver disease -recent or ongoing radiation therapy -an unusual or allergic reaction to doxorubicin, other chemotherapy agents, soybeans, other medicines, foods, dyes, or preservatives -pregnant or trying to get pregnant -breast-feeding How should I use this medicine? This drug is given as an infusion into a vein. It is administered in a hospital or clinic by a specially trained health care professional. If you have pain, swelling, burning or any unusual feeling around the site of your injection, tell your health care professional right away. Talk to your pediatrician regarding the use of this medicine in children. Special care may be needed. Overdosage: If you think you have taken too much of this medicine contact a poison control center or emergency room at once. NOTE: This medicine is only for you. Do not share this medicine with others. What if I miss a dose? It is important not to miss your dose. Call your doctor or health care professional if you are unable to keep an appointment. What may interact with this medicine? Do not take this medicine with any of the following medications: -zidovudine This medicine may also interact with the following medications: -medicines to increase blood counts like filgrastim, pegfilgrastim, sargramostim -vaccines Talk to your doctor or health care  professional before taking any of these medicines: -acetaminophen -aspirin -ibuprofen -ketoprofen -naproxen This list may not describe all possible interactions. Give your health care provider a list of all the medicines, herbs, non-prescription drugs, or dietary supplements you use. Also tell them if you smoke, drink alcohol, or use illegal drugs. Some items may interact with your medicine. What should I watch for while using this medicine? Your condition will be monitored carefully while you are receiving this medicine. You will need important blood work done while you are taking this medicine. This drug may make you feel generally unwell. This is not uncommon, as chemotherapy can affect healthy cells as well as cancer cells. Report any side effects. Continue your course of treatment even though you feel ill unless your doctor tells you to stop. Your urine may turn orange-red for a few days after your dose. This is not blood. If your urine is dark or brown, call your doctor. In some cases, you may be given additional medicines to help with side effects. Follow all directions for their use. Call your doctor or health care professional for advice if you get a fever (100.5 degrees F or higher), chills or sore throat, or other symptoms of a cold or flu. Do not treat yourself. This drug decreases your body's ability to fight infections. Try to avoid being around people who are sick. This medicine may increase your risk to bruise or bleed. Call your doctor or health care professional if you notice any unusual bleeding. Be careful brushing and flossing your teeth or using a toothpick because you may get an infection or bleed more easily. If you have any dental work done, tell your dentist  you are receiving this medicine. Avoid taking products that contain aspirin, acetaminophen, ibuprofen, naproxen, or ketoprofen unless instructed by your doctor. These medicines may hide a fever. Men and women of  childbearing age should use effective birth control methods while using taking this medicine. Do not become pregnant while taking this medicine. There is a potential for serious side effects to an unborn child. Talk to your health care professional or pharmacist for more information. Do not breast-feed an infant while taking this medicine. Talk to your doctor about your risk of cancer. You may be more at risk for certain types of cancers if you take this medicine. What side effects may I notice from receiving this medicine? Side effects that you should report to your doctor or health care professional as soon as possible: -allergic reactions like skin rash, itching or hives, swelling of the face, lips, or tongue -low blood counts - this medicine may decrease the number of white blood cells, red blood cells and platelets. You may be at increased risk for infections and bleeding. -signs of hand-foot syndrome - tingling or burning, redness, flaking, swelling, small blisters, or small sores on the palms of your hands or the soles of your feet -signs of infection - fever or chills, cough, sore throat, pain or difficulty passing urine -signs of decreased platelets or bleeding - bruising, pinpoint red spots on the skin, black, tarry stools, blood in the urine -signs of decreased red blood cells - unusually weak or tired, fainting spells, lightheadedness -back pain, chills, facial flushing, fever, headache, tightness in the chest or throat during the infusion -breathing problems -chest pain -fast, irregular heartbeat -mouth pain, redness, sores -pain, swelling, redness at site where injected -pain, tingling, numbness in the hands or feet -swelling of ankles, feet, or hands -vomiting Side effects that usually do not require medical attention (report to your doctor or health care professional if they continue or are bothersome): -diarrhea -hair loss -loss of appetite -nail discoloration or  damage -nausea -red or watery eyes -red colored urine -stomach upset This list may not describe all possible side effects. Call your doctor for medical advice about side effects. You may report side effects to FDA at 1-800-FDA-1088. Where should I keep my medicine? This drug is given in a hospital or clinic and will not be stored at home. NOTE: This sheet is a summary. It may not cover all possible information. If you have questions about this medicine, talk to your doctor, pharmacist, or health care provider.    2016, [employer] Standard. (2012-01-22 10:12:56)

## 2015-04-24 DIAGNOSIS — K6282 Dysplasia of anus: Secondary | ICD-10-CM | POA: Insufficient documentation

## 2015-04-25 ENCOUNTER — Other Ambulatory Visit: Payer: Self-pay | Admitting: *Deleted

## 2015-04-25 DIAGNOSIS — C469 Kaposi's sarcoma, unspecified: Secondary | ICD-10-CM

## 2015-04-26 ENCOUNTER — Ambulatory Visit (HOSPITAL_BASED_OUTPATIENT_CLINIC_OR_DEPARTMENT_OTHER): Payer: Medicaid Other

## 2015-04-26 ENCOUNTER — Other Ambulatory Visit (HOSPITAL_BASED_OUTPATIENT_CLINIC_OR_DEPARTMENT_OTHER): Payer: Medicaid Other

## 2015-04-26 ENCOUNTER — Ambulatory Visit (HOSPITAL_BASED_OUTPATIENT_CLINIC_OR_DEPARTMENT_OTHER): Payer: Medicaid Other | Admitting: Family

## 2015-04-26 VITALS — BP 114/83 | HR 88 | Temp 97.8°F | Resp 20 | Wt 152.0 lb

## 2015-04-26 DIAGNOSIS — C469 Kaposi's sarcoma, unspecified: Secondary | ICD-10-CM

## 2015-04-26 DIAGNOSIS — Z5111 Encounter for antineoplastic chemotherapy: Secondary | ICD-10-CM | POA: Diagnosis present

## 2015-04-26 DIAGNOSIS — B20 Human immunodeficiency virus [HIV] disease: Secondary | ICD-10-CM

## 2015-04-26 LAB — CMP (CANCER CENTER ONLY)
ALK PHOS: 71 U/L (ref 26–84)
ALT: 15 U/L (ref 10–47)
AST: 17 U/L (ref 11–38)
Albumin: 3.7 g/dL (ref 3.3–5.5)
BUN: 10 mg/dL (ref 7–22)
CO2: 25 mEq/L (ref 18–33)
CREATININE: 0.9 mg/dL (ref 0.6–1.2)
Calcium: 9.3 mg/dL (ref 8.0–10.3)
Chloride: 99 mEq/L (ref 98–108)
GLUCOSE: 95 mg/dL (ref 73–118)
POTASSIUM: 3.6 meq/L (ref 3.3–4.7)
SODIUM: 139 meq/L (ref 128–145)
TOTAL PROTEIN: 7.5 g/dL (ref 6.4–8.1)
Total Bilirubin: 0.9 mg/dl (ref 0.20–1.60)

## 2015-04-26 LAB — CBC WITH DIFFERENTIAL (CANCER CENTER ONLY)
BASO#: 0 10*3/uL (ref 0.0–0.2)
BASO%: 0.5 % (ref 0.0–2.0)
EOS%: 4.6 % (ref 0.0–7.0)
Eosinophils Absolute: 0.3 10*3/uL (ref 0.0–0.5)
HCT: 44.1 % (ref 38.7–49.9)
HGB: 15.4 g/dL (ref 13.0–17.1)
LYMPH#: 2.1 10*3/uL (ref 0.9–3.3)
LYMPH%: 31.8 % (ref 14.0–48.0)
MCH: 32.1 pg (ref 28.0–33.4)
MCHC: 34.9 g/dL (ref 32.0–35.9)
MCV: 92 fL (ref 82–98)
MONO#: 0.8 10*3/uL (ref 0.1–0.9)
MONO%: 11.5 % (ref 0.0–13.0)
NEUT#: 3.4 10*3/uL (ref 1.5–6.5)
NEUT%: 51.6 % (ref 40.0–80.0)
PLATELETS: 259 10*3/uL (ref 145–400)
RBC: 4.8 10*6/uL (ref 4.20–5.70)
RDW: 13.4 % (ref 11.1–15.7)
WBC: 6.6 10*3/uL (ref 4.0–10.0)

## 2015-04-26 LAB — LACTATE DEHYDROGENASE: LDH: 190 U/L (ref 125–245)

## 2015-04-26 MED ORDER — SODIUM CHLORIDE 0.9 % IV SOLN
Freq: Once | INTRAVENOUS | Status: AC
  Administered 2015-04-26: 12:00:00 via INTRAVENOUS
  Filled 2015-04-26: qty 4

## 2015-04-26 MED ORDER — SODIUM CHLORIDE 0.9 % IV SOLN
Freq: Once | INTRAVENOUS | Status: AC
  Administered 2015-04-26: 11:00:00 via INTRAVENOUS

## 2015-04-26 MED ORDER — DOXORUBICIN HCL LIPOSOMAL CHEMO INJECTION 2 MG/ML
20.0000 mg/m2 | Freq: Once | INTRAVENOUS | Status: AC
  Administered 2015-04-26: 36 mg via INTRAVENOUS
  Filled 2015-04-26: qty 18

## 2015-04-26 NOTE — Patient Instructions (Signed)
[facility] Discharge Instructions for Patients Receiving Chemotherapy  Today you received the following chemotherapy agents:Doxil.  To help prevent nausea and vomiting after your treatment, we encourage you to take your nausea medication. If you develop nausea and vomiting that is not controlled by your nausea medication, call the clinic.   BELOW ARE SYMPTOMS THAT SHOULD BE REPORTED IMMEDIATELY:  *FEVER GREATER THAN 100.5 F  *CHILLS WITH OR WITHOUT FEVER  NAUSEA AND VOMITING THAT IS NOT CONTROLLED WITH YOUR NAUSEA MEDICATION  *UNUSUAL SHORTNESS OF BREATH  *UNUSUAL BRUISING OR BLEEDING  TENDERNESS IN MOUTH AND THROAT WITH OR WITHOUT PRESENCE OF ULCERS  *URINARY PROBLEMS  *BOWEL PROBLEMS  UNUSUAL RASH Items with * indicate a potential emergency and should be followed up as soon as possible.  Feel free to call the clinic you have any questions or concerns. The clinic phone number is [phone].  Please show the CHEMO ALERT CARD at check-in to the Emergency Department and triage nurse.   

## 2015-04-26 NOTE — Progress Notes (Signed)
Hematology and Oncology Follow Up Visit  Todd Vincent [id number] 1975-07-16 39 y.o. 04/26/2015   Principle Diagnosis:  Kaposi's sarcoma - progressive HIV-asymptomatic  Current Therapy:   Doxil- q 3-week dosing    Interim History:  Todd Vincent is here today for a follow-up. He is doing well and has no complaints at this time.  No rash or new skin lesions. Hyperpigmented areas of the skin continue to fade.  No fever, chills, n/v, cough, dizziness, headaches, blurred vision, SOB, chest pain, palpitations, abdominal pain or changes in bowel or bladder habits.  His temporary ostomy is functioning appropriately and he is still hoping to have this reversed in March. He is on diuretics to help prevent swelling in his extremities. No swelling or tenderness at this time. 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 No new aches or pains.  His appetite is improved and he is staying well hydrated. He drinks 3-4 Boost daily and his weight is up 3 lbs this visit.  His Echo in October showed an EF of 50-55%.   Medications:    Medication List       This list is accurate as of: 04/26/15 11:12 AM.  Always use your most recent med list.               acyclovir 800 MG tablet  Commonly known as:  ZOVIRAX  Take 800 mg by mouth 5 (five) times daily.     ascorbic acid 500 MG tablet  Commonly known as:  VITAMIN C  Take by mouth.     citalopram 10 MG tablet  Commonly known as:  CELEXA  TAKE 1 TABLET BY MOUTH DAILY     COCONUT OIL PO  Take 1,000 mg by mouth 2 (two) times daily.     Darunavir Ethanolate 800 MG tablet  Commonly known as:  PREZISTA  TAKE 1 TABLET BY MOUTH EVERY DAY WITH BREAKFAST     dronabinol 5 MG capsule  Commonly known as:  MARINOL  Take 1 capsule (5 mg total) by mouth 2 (two) times daily before a meal.     elvitegravir-cobicistat-emtricitabine-tenofovir 150-150-200-10 MG Tabs tablet  Commonly known as:  GENVOYA  Take 1 tablet by mouth daily.       ferrous sulfate 325 (65 FE) MG EC tablet  Take by mouth.     gabapentin 300 MG capsule  Commonly known as:  NEURONTIN  Take 1 capsule (300 mg total) by mouth 3 (three) times daily.     LORazepam 0.5 MG tablet  Commonly known as:  ATIVAN  Take 1 tablet (0.5 mg total) by mouth every 6 (six) hours as needed (Nausea or vomiting).     meclizine 25 MG tablet  Commonly known as:  ANTIVERT  TAKE 1 TABLET BY MOUTH EVERY 6 HOURS AS NEEDED     ondansetron 8 MG tablet  Commonly known as:  ZOFRAN  Take 1 tablet (8 mg total) by mouth 2 (two) times daily. Start the day after chemo for 2 days. Then take as needed for nausea or vomiting.     traZODone 100 MG tablet  Commonly known as:  DESYREL  Take 1 tablet (100 mg total) by mouth at bedtime.     triamterene-hydrochlorothiazide 37.5-25 MG tablet  Commonly known as:  MAXZIDE-25  Take 1 tablet daily if needed for leg swelling     Vitamin D3 2000 UNITS Tabs  Take 2,000 Units by mouth every morning. 2 tabs =  4,000 U daily        Allergies:  Allergies  Allergen Reactions  . Sulfa Antibiotics Rash    Extreme itching     Past Medical History, Surgical history, Social history, and Family History were reviewed and updated.  Review of Systems: All other 10 point review of systems is negative.   Physical Exam:  weight is 152 lb (68.947 kg). His oral temperature is 97.8 F (36.6 C). His blood pressure is 114/83 and his pulse is 88. His respiration is 20.   Wt Readings from Last 3 Encounters:  04/26/15 152 lb (68.947 kg)  03/15/15 150 lb (68.04 kg)  02/22/15 149 lb (67.586 kg)    Ocular: Sclerae unicteric, pupils equal, round and reactive to light Ear-nose-throat: Oropharynx clear, dentition fair Lymphatic: No cervical supraclavicular or axillary adenopathy Lungs no rales or rhonchi, good excursion bilaterally Heart regular rate and rhythm, no murmur appreciated Abd soft, nontender, positive bowel sounds MSK no focal spinal  tenderness, no joint edema Neuro: non-focal, well-oriented, appropriate affect  Lab Results  Component Value Date   WBC 6.6 04/26/2015   HGB 15.4 04/26/2015   HCT 44.1 04/26/2015   MCV 92 04/26/2015   PLT 259 04/26/2015   No results found for: FERRITIN, IRON, TIBC, UIBC, IRONPCTSAT Lab Results  Component Value Date   RBC 4.80 04/26/2015   No results found for: KPAFRELGTCHN, LAMBDASER, KAPLAMBRATIO No results found for: IGGSERUM, IGA, IGMSERUM No results found for: Kathrynn Ducking, MSPIKE, SPEI   Chemistry      Component Value Date/Time   NA 139 04/26/2015 1027   NA 139 03/15/2015 0838   NA 140 12/20/2014 1037   K 3.6 04/26/2015 1027   K 3.8 03/15/2015 0838   K 4.5 12/20/2014 1037   CL 99 04/26/2015 1027   CL 103 12/20/2014 1037   CO2 25 04/26/2015 1027   CO2 24 03/15/2015 0838   CO2 25 12/20/2014 1037   BUN 10 04/26/2015 1027   BUN 10.0 03/15/2015 0838   BUN 11 12/20/2014 1037   CREATININE 0.9 04/26/2015 1027   CREATININE 0.9 03/15/2015 0838   CREATININE 1.02 11/23/2014 0946      Component Value Date/Time   CALCIUM 9.3 04/26/2015 1027   CALCIUM 9.4 03/15/2015 0838   CALCIUM 9.5 12/20/2014 1037   ALKPHOS 71 04/26/2015 1027   ALKPHOS 80 03/15/2015 0838   ALKPHOS 68 12/20/2014 1037   AST 17 04/26/2015 1027   AST 21 03/15/2015 0838   AST 12 12/20/2014 1037   ALT 15 04/26/2015 1027   ALT 21 03/15/2015 0838   ALT 9 12/20/2014 1037   BILITOT 0.90 04/26/2015 1027   BILITOT 0.44 03/15/2015 0838   BILITOT 0.5 12/20/2014 1037     Impression and Plan: Todd Vincent is 40 year old African American gentleman with Kaposi's sarcoma. He is doing well and responding nicely on Doxil. He is asymptomatic at this time.  His HIV has not been an issue. He follows up with Dr. Linus Vincent in February.  We will plan to see him back in 3 weeks for labs, follow-up and infusion. He will contact us with any questions or concerns. We can certainly see him  sooner if need be.   Eliezer Bottom, NP 12/9/201611:12 AM

## 2015-04-27 ENCOUNTER — Other Ambulatory Visit: Payer: Self-pay | Admitting: Hematology & Oncology

## 2015-04-27 ENCOUNTER — Other Ambulatory Visit: Payer: Self-pay | Admitting: Internal Medicine

## 2015-04-30 ENCOUNTER — Other Ambulatory Visit: Payer: Self-pay | Admitting: Internal Medicine

## 2015-04-30 ENCOUNTER — Other Ambulatory Visit: Payer: Self-pay | Admitting: *Deleted

## 2015-04-30 DIAGNOSIS — B20 Human immunodeficiency virus [HIV] disease: Secondary | ICD-10-CM

## 2015-04-30 DIAGNOSIS — E559 Vitamin D deficiency, unspecified: Secondary | ICD-10-CM

## 2015-04-30 DIAGNOSIS — C469 Kaposi's sarcoma, unspecified: Secondary | ICD-10-CM

## 2015-04-30 MED ORDER — DARUNAVIR ETHANOLATE 800 MG PO TABS: ORAL_TABLET | ORAL | Status: DC

## 2015-04-30 MED ORDER — TRIAMTERENE-HCTZ 37.5-25 MG PO TABS: ORAL_TABLET | ORAL | Status: DC

## 2015-05-16 ENCOUNTER — Other Ambulatory Visit: Payer: Self-pay | Admitting: Hematology & Oncology

## 2015-05-16 DIAGNOSIS — C469 Kaposi's sarcoma, unspecified: Secondary | ICD-10-CM

## 2015-05-17 ENCOUNTER — Ambulatory Visit (HOSPITAL_BASED_OUTPATIENT_CLINIC_OR_DEPARTMENT_OTHER): Payer: Medicaid Other

## 2015-05-17 ENCOUNTER — Encounter: Payer: Self-pay | Admitting: Hematology & Oncology

## 2015-05-17 ENCOUNTER — Ambulatory Visit (HOSPITAL_BASED_OUTPATIENT_CLINIC_OR_DEPARTMENT_OTHER): Payer: Medicaid Other | Admitting: Hematology & Oncology

## 2015-05-17 ENCOUNTER — Other Ambulatory Visit (HOSPITAL_BASED_OUTPATIENT_CLINIC_OR_DEPARTMENT_OTHER): Payer: Medicaid Other

## 2015-05-17 VITALS — BP 112/65 | HR 76 | Temp 97.9°F | Resp 18 | Ht 67.0 in | Wt 152.0 lb

## 2015-05-17 DIAGNOSIS — B20 Human immunodeficiency virus [HIV] disease: Secondary | ICD-10-CM | POA: Diagnosis not present

## 2015-05-17 DIAGNOSIS — Z5111 Encounter for antineoplastic chemotherapy: Secondary | ICD-10-CM

## 2015-05-17 DIAGNOSIS — C469 Kaposi's sarcoma, unspecified: Secondary | ICD-10-CM

## 2015-05-17 LAB — CMP (CANCER CENTER ONLY)
ALBUMIN: 3.2 g/dL — AB (ref 3.3–5.5)
ALT(SGPT): 15 U/L (ref 10–47)
AST: 21 U/L (ref 11–38)
Alkaline Phosphatase: 69 U/L (ref 26–84)
BILIRUBIN TOTAL: 0.6 mg/dL (ref 0.20–1.60)
BUN, Bld: 9 mg/dL (ref 7–22)
CALCIUM: 8.9 mg/dL (ref 8.0–10.3)
CHLORIDE: 103 meq/L (ref 98–108)
CO2: 26 meq/L (ref 18–33)
Creat: 0.9 mg/dl (ref 0.6–1.2)
Glucose, Bld: 97 mg/dL (ref 73–118)
POTASSIUM: 3.7 meq/L (ref 3.3–4.7)
Sodium: 139 mEq/L (ref 128–145)
Total Protein: 6.7 g/dL (ref 6.4–8.1)

## 2015-05-17 LAB — CBC WITH DIFFERENTIAL (CANCER CENTER ONLY)
BASO#: 0 10*3/uL (ref 0.0–0.2)
BASO%: 0.3 % (ref 0.0–2.0)
EOS%: 5.5 % (ref 0.0–7.0)
Eosinophils Absolute: 0.5 10*3/uL (ref 0.0–0.5)
HEMATOCRIT: 42.8 % (ref 38.7–49.9)
HGB: 14.9 g/dL (ref 13.0–17.1)
LYMPH#: 1.9 10*3/uL (ref 0.9–3.3)
LYMPH%: 20.1 % (ref 14.0–48.0)
MCH: 32.3 pg (ref 28.0–33.4)
MCHC: 34.8 g/dL (ref 32.0–35.9)
MCV: 93 fL (ref 82–98)
MONO#: 0.5 10*3/uL (ref 0.1–0.9)
MONO%: 5.6 % (ref 0.0–13.0)
NEUT%: 68.5 % (ref 40.0–80.0)
NEUTROS ABS: 6.5 10*3/uL (ref 1.5–6.5)
PLATELETS: 231 10*3/uL (ref 145–400)
RBC: 4.61 10*6/uL (ref 4.20–5.70)
RDW: 13.3 % (ref 11.1–15.7)
WBC: 9.5 10*3/uL (ref 4.0–10.0)

## 2015-05-17 LAB — LACTATE DEHYDROGENASE: LDH: 201 U/L (ref 125–245)

## 2015-05-17 MED ORDER — MECLIZINE HCL 25 MG PO TABS: 25.0000 mg | ORAL_TABLET | Freq: Four times a day (QID) | ORAL | Status: DC | PRN

## 2015-05-17 MED ORDER — DEXTROSE 5 % IV SOLN
Freq: Once | INTRAVENOUS | Status: AC
  Administered 2015-05-17: 10:00:00 via INTRAVENOUS

## 2015-05-17 MED ORDER — DOXORUBICIN HCL LIPOSOMAL CHEMO INJECTION 2 MG/ML
20.0000 mg/m2 | Freq: Once | INTRAVENOUS | Status: AC
  Administered 2015-05-17: 36 mg via INTRAVENOUS
  Filled 2015-05-17: qty 18

## 2015-05-17 MED ORDER — DRONABINOL 5 MG PO CAPS: 5.0000 mg | ORAL_CAPSULE | Freq: Two times a day (BID) | ORAL | Status: DC

## 2015-05-17 MED ORDER — SODIUM CHLORIDE 0.9 % IV SOLN
Freq: Once | INTRAVENOUS | Status: AC
  Administered 2015-05-17: 10:00:00 via INTRAVENOUS
  Filled 2015-05-17: qty 4

## 2015-05-17 NOTE — Progress Notes (Signed)
Hematology and Oncology Follow Up Visit  Todd Vincent [id number] January 28, 1976 39 y.o. 05/17/2015   Principle Diagnosis:  Kaposi's sarcoma - progressive HIV-asymptomatic  Current Therapy:   Doxil- q 4-week dosing    Interim History:  Todd Vincent is here today for a follow-up. He had a very good Christmas. He had a good Thanksgiving. He will be sent home for New Year's Eve.  He is tolerated the Doxil very well. His skin lesions have not progressed. He's had no new skin lesions.  He has a colostomy that will be reversed in either March or April. I do not see any problems with him having this done.  He is on some new HIV medications. Everything appears to be doing very well with his HIV levels.  His appetite is good. He's had no nausea or vomiting.  He's had no change in bowel or bladder habits.  He's had no cough.  Overall, his performance status is ECOG 1. .   Medications:    Medication List       This list is accurate as of: 05/17/15  9:35 AM.  Always use your most recent med list.               acyclovir 800 MG tablet  Commonly known as:  ZOVIRAX  Take 800 mg by mouth 5 (five) times daily.     ascorbic acid 500 MG tablet  Commonly known as:  VITAMIN C  Take by mouth.     citalopram 10 MG tablet  Commonly known as:  CELEXA  TAKE 1 TABLET BY MOUTH DAILY     COCONUT OIL PO  Take 1,000 mg by mouth 2 (two) times daily.     Darunavir Ethanolate 800 MG tablet  Commonly known as:  PREZISTA  TAKE 1 TABLET BY MOUTH EVERY DAY WITH BREAKFAST     PREZISTA 800 MG tablet  Generic drug:  Darunavir Ethanolate  TAKE 1 TABLET BY MOUTH EVERY DAY WITH BREAKFAST.( TAKE WITH GENVOYA)     dronabinol 5 MG capsule  Commonly known as:  MARINOL  Take 1 capsule (5 mg total) by mouth 2 (two) times daily before a meal.     ferrous sulfate 325 (65 FE) MG EC tablet  Take by mouth.     gabapentin 300 MG capsule  Commonly known as:  NEURONTIN  Take 1 capsule (300 mg total) by  mouth 3 (three) times daily.     GENVOYA 150-150-200-10 MG Tabs tablet  Generic drug:  elvitegravir-cobicistat-emtricitabine-tenofovir  TAKE 1 TABLET BY MOUTH DAILY.     LORazepam 0.5 MG tablet  Commonly known as:  ATIVAN  Take 1 tablet (0.5 mg total) by mouth every 6 (six) hours as needed (Nausea or vomiting).     meclizine 25 MG tablet  Commonly known as:  ANTIVERT  Take 1 tablet (25 mg total) by mouth every 6 (six) hours as needed.     ondansetron 8 MG tablet  Commonly known as:  ZOFRAN  Take 1 tablet (8 mg total) by mouth 2 (two) times daily. Start the day after chemo for 2 days. Then take as needed for nausea or vomiting.     traZODone 100 MG tablet  Commonly known as:  DESYREL  Take 1 tablet (100 mg total) by mouth at bedtime.     triamterene-hydrochlorothiazide 37.5-25 MG tablet  Commonly known as:  MAXZIDE-25  Take 1 tablet daily if needed for leg swelling     Vitamin D3 2000 units Tabs  Take 2,000 Units by mouth every morning. 2 tabs = 4,000 U daily        Allergies:  Allergies  Allergen Reactions  . Sulfa Antibiotics Rash    Extreme itching     Past Medical History, Surgical history, Social history, and Family History were reviewed and updated.  Review of Systems: All other 10 point review of systems is negative.   Physical Exam:  height is 5\' 7"  (1.702 m) and weight is 152 lb (68.947 kg). His oral temperature is 97.9 F (36.6 C). His blood pressure is 112/65 and his pulse is 76. His respiration is 18.   Wt Readings from Last 3 Encounters:  05/17/15 152 lb (68.947 kg)  04/26/15 152 lb (68.947 kg)  03/15/15 150 lb (68.04 kg)    Ocular: Sclerae unicteric, pupils equal, round and reactive to light Ear-nose-throat: Oropharynx clear, dentition fair Lymphatic: No cervical supraclavicular or axillary adenopathy Lungs no rales or rhonchi, good excursion bilaterally Heart regular rate and rhythm, no murmur appreciated Abd soft, nontender, positive bowel  sounds MSK no focal spinal tenderness, no joint edema Neuro: non-focal, well-oriented, appropriate affect  Lab Results  Component Value Date   WBC 9.5 05/17/2015   HGB 14.9 05/17/2015   HCT 42.8 05/17/2015   MCV 93 05/17/2015   PLT 231 05/17/2015   No results found for: FERRITIN, IRON, TIBC, UIBC, IRONPCTSAT Lab Results  Component Value Date   RBC 4.61 05/17/2015   No results found for: KPAFRELGTCHN, LAMBDASER, KAPLAMBRATIO No results found for: IGGSERUM, IGA, IGMSERUM No results found for: Kathrynn Ducking, MSPIKE, SPEI   Chemistry      Component Value Date/Time   NA 139 04/26/2015 1027   NA 139 03/15/2015 0838   NA 140 12/20/2014 1037   K 3.6 04/26/2015 1027   K 3.8 03/15/2015 0838   K 4.5 12/20/2014 1037   CL 99 04/26/2015 1027   CL 103 12/20/2014 1037   CO2 25 04/26/2015 1027   CO2 24 03/15/2015 0838   CO2 25 12/20/2014 1037   BUN 10 04/26/2015 1027   BUN 10.0 03/15/2015 0838   BUN 11 12/20/2014 1037   CREATININE 0.9 04/26/2015 1027   CREATININE 0.9 03/15/2015 0838   CREATININE 1.02 11/23/2014 0946      Component Value Date/Time   CALCIUM 9.3 04/26/2015 1027   CALCIUM 9.4 03/15/2015 0838   CALCIUM 9.5 12/20/2014 1037   ALKPHOS 71 04/26/2015 1027   ALKPHOS 80 03/15/2015 0838   ALKPHOS 68 12/20/2014 1037   AST 17 04/26/2015 1027   AST 21 03/15/2015 0838   AST 12 12/20/2014 1037   ALT 15 04/26/2015 1027   ALT 21 03/15/2015 0838   ALT 9 12/20/2014 1037   BILITOT 0.90 04/26/2015 1027   BILITOT 0.44 03/15/2015 0838   BILITOT 0.5 12/20/2014 1037     Impression and Plan: Mr. Woosley is 39 year old African American gentleman with Kaposi's sarcoma. He is doing well and responding nicely on Doxil. He is asymptomatic at this time.  His HIV has not been an issue. He follows up with Dr. Linus Salmons in February.  I think that we can move his appointments to once a month now. I think this would be very reasonable.   His last  echocardiogram was back in October. I think it would be nice to repeat this.    [name], MD 12/30/20169:35 AM

## 2015-05-17 NOTE — Patient Instructions (Signed)
[facility] Discharge Instructions for Patients Receiving Chemotherapy  Today you received the following chemotherapy agents:Doxil.  To help prevent nausea and vomiting after your treatment, we encourage you to take your nausea medication. If you develop nausea and vomiting that is not controlled by your nausea medication, call the clinic.   BELOW ARE SYMPTOMS THAT SHOULD BE REPORTED IMMEDIATELY:  *FEVER GREATER THAN 100.5 F  *CHILLS WITH OR WITHOUT FEVER  NAUSEA AND VOMITING THAT IS NOT CONTROLLED WITH YOUR NAUSEA MEDICATION  *UNUSUAL SHORTNESS OF BREATH  *UNUSUAL BRUISING OR BLEEDING  TENDERNESS IN MOUTH AND THROAT WITH OR WITHOUT PRESENCE OF ULCERS  *URINARY PROBLEMS  *BOWEL PROBLEMS  UNUSUAL RASH Items with * indicate a potential emergency and should be followed up as soon as possible.  Feel free to call the clinic you have any questions or concerns. The clinic phone number is [phone].  Please show the CHEMO ALERT CARD at check-in to the Emergency Department and triage nurse.   

## 2015-05-21 ENCOUNTER — Telehealth: Payer: Self-pay | Admitting: Internal Medicine

## 2015-05-21 ENCOUNTER — Other Ambulatory Visit: Payer: Medicaid Other

## 2015-05-21 ENCOUNTER — Ambulatory Visit: Payer: Medicaid Other | Admitting: Hematology & Oncology

## 2015-05-21 ENCOUNTER — Other Ambulatory Visit: Payer: Self-pay | Admitting: *Deleted

## 2015-05-21 DIAGNOSIS — F32A Depression, unspecified: Secondary | ICD-10-CM

## 2015-05-21 DIAGNOSIS — F329 Major depressive disorder, single episode, unspecified: Secondary | ICD-10-CM

## 2015-05-21 MED ORDER — CITALOPRAM HYDROBROMIDE 10 MG PO TABS: 10.0000 mg | ORAL_TABLET | Freq: Every day | ORAL | Status: DC

## 2015-05-21 NOTE — Telephone Encounter (Signed)
Completed.  Thank you! 

## 2015-05-21 NOTE — Telephone Encounter (Signed)
[facility] pharmacy left a vm on medication assistance line today at 9:14 am, asking to speak with someone regarding patients meds, has questions about 3 different meds. May be reached at [phone].

## 2015-05-22 ENCOUNTER — Other Ambulatory Visit: Payer: Self-pay | Admitting: Internal Medicine

## 2015-05-22 ENCOUNTER — Other Ambulatory Visit: Payer: Self-pay | Admitting: Family

## 2015-05-23 ENCOUNTER — Other Ambulatory Visit: Payer: Self-pay | Admitting: Family

## 2015-06-04 ENCOUNTER — Other Ambulatory Visit: Payer: Medicaid Other

## 2015-06-04 ENCOUNTER — Other Ambulatory Visit (HOSPITAL_COMMUNITY)
Admission: RE | Admit: 2015-06-04 | Discharge: 2015-06-04 | Disposition: A | Discharge status: Home / Self Care | Payer: Medicaid Other | Source: Ambulatory Visit | Attending: Internal Medicine | Admitting: Internal Medicine

## 2015-06-04 DIAGNOSIS — Z113 Encounter for screening for infections with a predominantly sexual mode of transmission: Secondary | ICD-10-CM

## 2015-06-04 DIAGNOSIS — B20 Human immunodeficiency virus [HIV] disease: Secondary | ICD-10-CM

## 2015-06-05 ENCOUNTER — Other Ambulatory Visit: Payer: Self-pay | Admitting: Hematology & Oncology

## 2015-06-05 ENCOUNTER — Ambulatory Visit (HOSPITAL_BASED_OUTPATIENT_CLINIC_OR_DEPARTMENT_OTHER)
Admission: RE | Admit: 2015-06-05 | Discharge: 2015-06-05 | Disposition: A | Discharge status: Home / Self Care | Payer: Medicaid Other | Source: Ambulatory Visit | Attending: Hematology & Oncology | Admitting: Hematology & Oncology

## 2015-06-05 DIAGNOSIS — C469 Kaposi's sarcoma, unspecified: Secondary | ICD-10-CM

## 2015-06-05 DIAGNOSIS — Z87891 Personal history of nicotine dependence: Secondary | ICD-10-CM | POA: Insufficient documentation

## 2015-06-05 DIAGNOSIS — Z09 Encounter for follow-up examination after completed treatment for conditions other than malignant neoplasm: Secondary | ICD-10-CM | POA: Diagnosis present

## 2015-06-05 LAB — HIV-1 RNA QUANT-NO REFLEX-BLD

## 2015-06-05 LAB — URINE CYTOLOGY ANCILLARY ONLY
CHLAMYDIA, DNA PROBE: NEGATIVE
Neisseria Gonorrhea: NEGATIVE

## 2015-06-05 LAB — T-HELPER CELL (CD4) - (RCID CLINIC ONLY)
CD4 T CELL HELPER: 17 % — AB (ref 33–55)
CD4 T Cell Abs: 190 /uL — ABNORMAL LOW (ref 400–2700)

## 2015-06-05 LAB — RPR

## 2015-06-05 NOTE — Progress Notes (Signed)
  Echocardiogram 2D Echocardiogram has been performed.  Jennette Dubin 06/05/2015, 10:38 AM

## 2015-06-06 ENCOUNTER — Encounter: Payer: Self-pay | Admitting: *Deleted

## 2015-06-12 DIAGNOSIS — K6282 Dysplasia of anus: Secondary | ICD-10-CM | POA: Diagnosis not present

## 2015-06-12 DIAGNOSIS — Z01818 Encounter for other preprocedural examination: Secondary | ICD-10-CM | POA: Diagnosis not present

## 2015-06-14 ENCOUNTER — Encounter: Payer: Self-pay | Admitting: Family

## 2015-06-14 ENCOUNTER — Ambulatory Visit (HOSPITAL_BASED_OUTPATIENT_CLINIC_OR_DEPARTMENT_OTHER): Payer: Medicaid Other | Admitting: Family

## 2015-06-14 ENCOUNTER — Other Ambulatory Visit: Payer: Self-pay | Admitting: *Deleted

## 2015-06-14 ENCOUNTER — Ambulatory Visit (HOSPITAL_BASED_OUTPATIENT_CLINIC_OR_DEPARTMENT_OTHER): Payer: Medicaid Other

## 2015-06-14 ENCOUNTER — Other Ambulatory Visit (HOSPITAL_BASED_OUTPATIENT_CLINIC_OR_DEPARTMENT_OTHER): Payer: Medicaid Other

## 2015-06-14 ENCOUNTER — Other Ambulatory Visit: Payer: Self-pay | Admitting: Hematology & Oncology

## 2015-06-14 VITALS — BP 114/75 | HR 71 | Temp 98.3°F | Resp 18 | Ht 67.0 in | Wt 148.0 lb

## 2015-06-14 DIAGNOSIS — Z5111 Encounter for antineoplastic chemotherapy: Secondary | ICD-10-CM

## 2015-06-14 DIAGNOSIS — C469 Kaposi's sarcoma, unspecified: Secondary | ICD-10-CM

## 2015-06-14 DIAGNOSIS — Z21 Asymptomatic human immunodeficiency virus [HIV] infection status: Secondary | ICD-10-CM | POA: Diagnosis not present

## 2015-06-14 LAB — CBC WITH DIFFERENTIAL (CANCER CENTER ONLY)
BASO#: 0 10*3/uL (ref 0.0–0.2)
BASO%: 0.3 % (ref 0.0–2.0)
EOS ABS: 0.6 10*3/uL — AB (ref 0.0–0.5)
EOS%: 8.6 % — ABNORMAL HIGH (ref 0.0–7.0)
HCT: 44.7 % (ref 38.7–49.9)
HGB: 15.6 g/dL (ref 13.0–17.1)
LYMPH#: 2 10*3/uL (ref 0.9–3.3)
LYMPH%: 30.8 % (ref 14.0–48.0)
MCH: 32.3 pg (ref 28.0–33.4)
MCHC: 34.9 g/dL (ref 32.0–35.9)
MCV: 93 fL (ref 82–98)
MONO#: 0.7 10*3/uL (ref 0.1–0.9)
MONO%: 10.5 % (ref 0.0–13.0)
NEUT%: 49.8 % (ref 40.0–80.0)
NEUTROS ABS: 3.2 10*3/uL (ref 1.5–6.5)
PLATELETS: 236 10*3/uL (ref 145–400)
RBC: 4.83 10*6/uL (ref 4.20–5.70)
RDW: 13.5 % (ref 11.1–15.7)
WBC: 6.4 10*3/uL (ref 4.0–10.0)

## 2015-06-14 LAB — CMP (CANCER CENTER ONLY)
ALK PHOS: 68 U/L (ref 26–84)
ALT: 14 U/L (ref 10–47)
AST: 17 U/L (ref 11–38)
Albumin: 3.5 g/dL (ref 3.3–5.5)
BUN, Bld: 11 mg/dL (ref 7–22)
CALCIUM: 9.4 mg/dL (ref 8.0–10.3)
CO2: 26 meq/L (ref 18–33)
Chloride: 105 mEq/L (ref 98–108)
Creat: 1.1 mg/dl (ref 0.6–1.2)
GLUCOSE: 125 mg/dL — AB (ref 73–118)
POTASSIUM: 3.7 meq/L (ref 3.3–4.7)
Sodium: 140 mEq/L (ref 128–145)
Total Bilirubin: 0.6 mg/dl (ref 0.20–1.60)
Total Protein: 7.6 g/dL (ref 6.4–8.1)

## 2015-06-14 LAB — LACTATE DEHYDROGENASE: LDH: 202 U/L (ref 125–245)

## 2015-06-14 MED ORDER — MECLIZINE HCL 25 MG PO TABS: 25.0000 mg | ORAL_TABLET | Freq: Four times a day (QID) | ORAL | Status: DC | PRN

## 2015-06-14 MED ORDER — GABAPENTIN 300 MG PO CAPS: 300.0000 mg | ORAL_CAPSULE | Freq: Three times a day (TID) | ORAL | Status: DC

## 2015-06-14 MED ORDER — DRONABINOL 5 MG PO CAPS: 5.0000 mg | ORAL_CAPSULE | Freq: Two times a day (BID) | ORAL | Status: DC

## 2015-06-14 MED ORDER — LORAZEPAM 0.5 MG PO TABS: 0.5000 mg | ORAL_TABLET | Freq: Four times a day (QID) | ORAL | Status: DC | PRN

## 2015-06-14 MED ORDER — TRAZODONE HCL 100 MG PO TABS: 100.0000 mg | ORAL_TABLET | Freq: Every day | ORAL | Status: DC

## 2015-06-14 MED ORDER — DEXAMETHASONE SODIUM PHOSPHATE 100 MG/10ML IJ SOLN
Freq: Once | INTRAMUSCULAR | Status: AC
  Administered 2015-06-14: 13:00:00 via INTRAVENOUS
  Filled 2015-06-14: qty 4

## 2015-06-14 MED ORDER — DEXTROSE 5 % IV SOLN
Freq: Once | INTRAVENOUS | Status: AC
Start: 2015-06-14 — End: 2015-06-14
  Administered 2015-06-14: 13:00:00 via INTRAVENOUS

## 2015-06-14 MED ORDER — DOXORUBICIN HCL LIPOSOMAL CHEMO INJECTION 2 MG/ML
20.0000 mg/m2 | Freq: Once | INTRAVENOUS | Status: AC
  Administered 2015-06-14: 36 mg via INTRAVENOUS
  Filled 2015-06-14: qty 18

## 2015-06-14 NOTE — Patient Instructions (Signed)
[facility] Discharge Instructions for Patients Receiving Chemotherapy  Today you received the following chemotherapy agents:Doxil.  To help prevent nausea and vomiting after your treatment, we encourage you to take your nausea medication. If you develop nausea and vomiting that is not controlled by your nausea medication, call the clinic.   BELOW ARE SYMPTOMS THAT SHOULD BE REPORTED IMMEDIATELY:  *FEVER GREATER THAN 100.5 F  *CHILLS WITH OR WITHOUT FEVER  NAUSEA AND VOMITING THAT IS NOT CONTROLLED WITH YOUR NAUSEA MEDICATION  *UNUSUAL SHORTNESS OF BREATH  *UNUSUAL BRUISING OR BLEEDING  TENDERNESS IN MOUTH AND THROAT WITH OR WITHOUT PRESENCE OF ULCERS  *URINARY PROBLEMS  *BOWEL PROBLEMS  UNUSUAL RASH Items with * indicate a potential emergency and should be followed up as soon as possible.  Feel free to call the clinic you have any questions or concerns. The clinic phone number is [phone].  Please show the CHEMO ALERT CARD at check-in to the Emergency Department and triage nurse.   

## 2015-06-15 NOTE — Progress Notes (Signed)
Hematology and Oncology Follow Up Visit  Todd Vincent [id number] 1975/08/19 40 y.o. 06/15/2015   Principle Diagnosis:  Kaposi's sarcoma - progressive HIV-asymptomatic  Current Therapy:   Doxil- q 4 week dosing    Interim History:  Todd Vincent is here today for a follow-up. He is dong fairly well. He had a fall a few weeks ago due to vertigo but thankfully was not injured. He takes Antivert which does resolve his symptoms.   He has no new skin lesions or rashes. He continues to be followed closely by ID and his HIV levels have been stable.  No fatigue, fever, chills, n/v, cough, dizziness, headaches, blurred vision, SOB, chest pain, palpitations, abdominal pain or changes in bowel or bladder habits.  His temporary ostomy is functioning appropriately. He saw his surgeon on Wednesday this week and is planning on having his reversal surgery in April.  He is on diuretics to help prevent swelling in his extremities. He has skipped it for several days and his ankles are "puffy" today so he plans to restart it when he gets home. The neuropathy in his hands and feet is unchanged. No c/o pain at this time.  He is appetite comes and goes and he continues to supplement with Boost several times a day. His weight is down 4 lbs since his last visit.  His Echo in January showed an EF of 50-55%.   Medications:    Medication List       This list is accurate as of: 06/14/15 11:59 PM.  Always use your most recent med list.               acyclovir 800 MG tablet  Commonly known as:  ZOVIRAX  Take 800 mg by mouth 5 (five) times daily.     ascorbic acid 500 MG tablet  Commonly known as:  VITAMIN C  Take by mouth.     citalopram 10 MG tablet  Commonly known as:  CELEXA  Take 1 tablet (10 mg total) by mouth daily.     COCONUT OIL PO  Take 1,000 mg by mouth 2 (two) times daily.     Darunavir Ethanolate 800 MG tablet  Commonly known as:  PREZISTA  TAKE 1 TABLET BY MOUTH EVERY DAY WITH  BREAKFAST     PREZISTA 800 MG tablet  Generic drug:  Darunavir Ethanolate  TAKE 1 TABLET BY MOUTH EVERY DAY WITH BREAKFAST.( TAKE WITH GENVOYA)     dronabinol 5 MG capsule  Commonly known as:  MARINOL  Take 1 capsule (5 mg total) by mouth 2 (two) times daily before a meal.     ferrous sulfate 325 (65 FE) MG EC tablet  Take by mouth.     gabapentin 300 MG capsule  Commonly known as:  NEURONTIN  Take 1 capsule (300 mg total) by mouth 3 (three) times daily.     GENVOYA 150-150-200-10 MG Tabs tablet  Generic drug:  elvitegravir-cobicistat-emtricitabine-tenofovir  TAKE 1 TABLET BY MOUTH DAILY.     LORazepam 0.5 MG tablet  Commonly known as:  ATIVAN  Take 1 tablet (0.5 mg total) by mouth every 6 (six) hours as needed (Nausea or vomiting).     meclizine 25 MG tablet  Commonly known as:  ANTIVERT  Take 1 tablet (25 mg total) by mouth every 6 (six) hours as needed.     ondansetron 8 MG tablet  Commonly known as:  ZOFRAN  Take 1 tablet (8 mg total) by mouth 2 (two) times  daily. Start the day after chemo for 2 days. Then take as needed for nausea or vomiting.     traZODone 100 MG tablet  Commonly known as:  DESYREL  Take 1 tablet (100 mg total) by mouth at bedtime.     triamterene-hydrochlorothiazide 37.5-25 MG tablet  Commonly known as:  MAXZIDE-25  Take 1 tablet daily if needed for leg swelling     Vitamin D3 2000 units Tabs  Take 2,000 Units by mouth every morning. 2 tabs = 4,000 U daily        Allergies:  Allergies  Allergen Reactions  . Sulfa Antibiotics Rash    Extreme itching     Past Medical History, Surgical history, Social history, and Family History were reviewed and updated.  Review of Systems: All other 10 point review of systems is negative.   Physical Exam:  height is 5\' 7"  (1.702 m) and weight is 148 lb (67.132 kg). His oral temperature is 98.3 F (36.8 C). His blood pressure is 114/75 and his pulse is 71. His respiration is 18.   Wt Readings from  Last 3 Encounters:  06/14/15 148 lb (67.132 kg)  05/17/15 152 lb (68.947 kg)  04/26/15 152 lb (68.947 kg)    Ocular: Sclerae unicteric, pupils equal, round and reactive to light Ear-nose-throat: Oropharynx clear, dentition fair Lymphatic: No cervical supraclavicular or axillary adenopathy Lungs no rales or rhonchi, good excursion bilaterally Heart regular rate and rhythm, no murmur appreciated Abd soft, nontender, positive bowel sounds, no liver or spleen tip palpated on exam  MSK no focal spinal tenderness, no joint edema Neuro: non-focal, well-oriented, appropriate affect  Lab Results  Component Value Date   WBC 6.4 06/14/2015   HGB 15.6 06/14/2015   HCT 44.7 06/14/2015   MCV 93 06/14/2015   PLT 236 06/14/2015   No results found for: FERRITIN, IRON, TIBC, UIBC, IRONPCTSAT Lab Results  Component Value Date   RBC 4.83 06/14/2015   No results found for: KPAFRELGTCHN, LAMBDASER, KAPLAMBRATIO No results found for: IGGSERUM, IGA, IGMSERUM No results found for: Kathrynn Ducking, MSPIKE, SPEI   Chemistry      Component Value Date/Time   NA 140 06/14/2015 1051   NA 139 03/15/2015 0838   NA 140 12/20/2014 1037   K 3.7 06/14/2015 1051   K 3.8 03/15/2015 0838   K 4.5 12/20/2014 1037   CL 105 06/14/2015 1051   CL 103 12/20/2014 1037   CO2 26 06/14/2015 1051   CO2 24 03/15/2015 0838   CO2 25 12/20/2014 1037   BUN 11 06/14/2015 1051   BUN 10.0 03/15/2015 0838   BUN 11 12/20/2014 1037   CREATININE 1.1 06/14/2015 1051   CREATININE 0.9 03/15/2015 0838   CREATININE 1.02 11/23/2014 0946      Component Value Date/Time   CALCIUM 9.4 06/14/2015 1051   CALCIUM 9.4 03/15/2015 0838   CALCIUM 9.5 12/20/2014 1037   ALKPHOS 68 06/14/2015 1051   ALKPHOS 80 03/15/2015 0838   ALKPHOS 68 12/20/2014 1037   AST 17 06/14/2015 1051   AST 21 03/15/2015 0838   AST 12 12/20/2014 1037   ALT 14 06/14/2015 1051   ALT 21 03/15/2015 0838   ALT 9  12/20/2014 1037   BILITOT 0.60 06/14/2015 1051   BILITOT 0.44 03/15/2015 0838   BILITOT 0.5 12/20/2014 1037     Impression and Plan: Todd Vincent is 40 year old African American gentleman with Kaposi's sarcoma. He is doing well and responding nicely on Doxil.  He has no new lesions at this time.  We will treat him today as planned and then once more in 4 weeks. After that, we will give him a break until he notices any new rash or lesion start to appear.  He is planning to have his ostomy reversal surgery in April. This will be fine.  We will plan to see him back in 1 month for labs, follow-up and infusion. He will contact us with any questions or concerns. We can certainly see him sooner if need be.   Eliezer Bottom, NP 1/28/201710:12 AM

## 2015-06-20 ENCOUNTER — Ambulatory Visit (INDEPENDENT_AMBULATORY_CARE_PROVIDER_SITE_OTHER): Payer: Self-pay | Admitting: Internal Medicine

## 2015-06-20 ENCOUNTER — Encounter: Payer: Self-pay | Admitting: Internal Medicine

## 2015-06-20 VITALS — BP 128/88 | HR 85 | Temp 98.5°F | Ht 67.0 in | Wt 151.0 lb

## 2015-06-20 DIAGNOSIS — B20 Human immunodeficiency virus [HIV] disease: Secondary | ICD-10-CM

## 2015-06-20 DIAGNOSIS — Z21 Asymptomatic human immunodeficiency virus [HIV] infection status: Secondary | ICD-10-CM

## 2015-06-20 DIAGNOSIS — Z932 Ileostomy status: Secondary | ICD-10-CM

## 2015-06-20 NOTE — Progress Notes (Signed)
  Subjective:    Patient ID: Todd Vincent, male    DOB: 23-Oct-1975, 40 y.o.   MRN: [id number]  HPI He comes in for routine follow up of HIV.   He was on [place] and truvada, prezista, norvir and I changed him to [place] last year.  Some minimal initial effects but no issues now.  Pleased with the easier regimen.  He has been on doxil for Kaposi's sarcoma and getting one more at the end of the month until a 6 month break.   His CD4 is 190 now, down from 260.  No new issues.  No weight loss, trying to maintain weight.  no diarrhea. No new complaints.   Getting reversal of ostomy next month in [place].    Review of Systems  Constitutional: Negative for fever and chills.  HENT: Negative for sore throat and trouble swallowing.   Gastrointestinal: Negative for nausea and diarrhea.  Skin: Negative for rash.  Neurological: Negative for dizziness.  Psychiatric/Behavioral: Negative for dysphoric mood.       Objective:   Physical Exam  Constitutional: He appears well-developed and well-nourished. No distress.  HENT:  Mouth/Throat: No oropharyngeal exudate.  Eyes: No scleral icterus.  Cardiovascular: Normal rate, regular rhythm and normal heart sounds.   No murmur heard. Pulmonary/Chest: Effort normal and breath sounds normal. No respiratory distress.  Lymphadenopathy:    He has no cervical adenopathy.  Skin: No rash noted.             Assessment & Plan:

## 2015-06-20 NOTE — Assessment & Plan Note (Signed)
Getting reversal.  Will check labs before surgery.

## 2015-06-20 NOTE — Assessment & Plan Note (Signed)
Doing well.  CD4 down likely from doxil but vl remains undetectable.  Will have him restart dapsone which he has at home.

## 2015-06-21 DIAGNOSIS — A630 Anogenital (venereal) warts: Secondary | ICD-10-CM | POA: Diagnosis not present

## 2015-06-21 DIAGNOSIS — K6282 Dysplasia of anus: Secondary | ICD-10-CM | POA: Diagnosis not present

## 2015-06-21 DIAGNOSIS — R85613 High grade squamous intraepithelial lesion on cytologic smear of anus (HGSIL): Secondary | ICD-10-CM | POA: Diagnosis not present

## 2015-06-23 ENCOUNTER — Other Ambulatory Visit: Payer: Self-pay | Admitting: Internal Medicine

## 2015-06-23 DIAGNOSIS — B20 Human immunodeficiency virus [HIV] disease: Secondary | ICD-10-CM

## 2015-07-12 ENCOUNTER — Encounter: Payer: Self-pay | Admitting: Hematology & Oncology

## 2015-07-12 ENCOUNTER — Ambulatory Visit (HOSPITAL_BASED_OUTPATIENT_CLINIC_OR_DEPARTMENT_OTHER): Payer: Medicaid Other

## 2015-07-12 ENCOUNTER — Ambulatory Visit (HOSPITAL_BASED_OUTPATIENT_CLINIC_OR_DEPARTMENT_OTHER): Payer: Medicaid Other | Admitting: Hematology & Oncology

## 2015-07-12 ENCOUNTER — Other Ambulatory Visit (HOSPITAL_BASED_OUTPATIENT_CLINIC_OR_DEPARTMENT_OTHER): Payer: Medicaid Other

## 2015-07-12 VITALS — BP 127/77 | HR 88 | Temp 97.8°F | Resp 16 | Ht 67.0 in | Wt 149.0 lb

## 2015-07-12 DIAGNOSIS — Z5111 Encounter for antineoplastic chemotherapy: Secondary | ICD-10-CM

## 2015-07-12 DIAGNOSIS — C469 Kaposi's sarcoma, unspecified: Secondary | ICD-10-CM | POA: Diagnosis not present

## 2015-07-12 DIAGNOSIS — Z21 Asymptomatic human immunodeficiency virus [HIV] infection status: Secondary | ICD-10-CM

## 2015-07-12 LAB — CMP (CANCER CENTER ONLY)
ALT(SGPT): 14 U/L (ref 10–47)
AST: 15 U/L (ref 11–38)
Albumin: 3.2 g/dL — ABNORMAL LOW (ref 3.3–5.5)
Alkaline Phosphatase: 79 U/L (ref 26–84)
BUN: 11 mg/dL (ref 7–22)
CHLORIDE: 103 meq/L (ref 98–108)
CO2: 27 meq/L (ref 18–33)
CREATININE: 1.2 mg/dL (ref 0.6–1.2)
Calcium: 9.2 mg/dL (ref 8.0–10.3)
GLUCOSE: 115 mg/dL (ref 73–118)
POTASSIUM: 3.3 meq/L (ref 3.3–4.7)
SODIUM: 135 meq/L (ref 128–145)
TOTAL PROTEIN: 7.4 g/dL (ref 6.4–8.1)
Total Bilirubin: 0.5 mg/dl (ref 0.20–1.60)

## 2015-07-12 LAB — CBC WITH DIFFERENTIAL (CANCER CENTER ONLY)
BASO#: 0 10*3/uL (ref 0.0–0.2)
BASO%: 0.2 % (ref 0.0–2.0)
EOS ABS: 0.3 10*3/uL (ref 0.0–0.5)
EOS%: 3.9 % (ref 0.0–7.0)
HEMATOCRIT: 40.1 % (ref 38.7–49.9)
HGB: 13.8 g/dL (ref 13.0–17.1)
LYMPH#: 1.5 10*3/uL (ref 0.9–3.3)
LYMPH%: 17.5 % (ref 14.0–48.0)
MCH: 32 pg (ref 28.0–33.4)
MCHC: 34.4 g/dL (ref 32.0–35.9)
MCV: 93 fL (ref 82–98)
MONO#: 0.6 10*3/uL (ref 0.1–0.9)
MONO%: 7.2 % (ref 0.0–13.0)
NEUT#: 6.2 10*3/uL (ref 1.5–6.5)
NEUT%: 71.2 % (ref 40.0–80.0)
PLATELETS: 380 10*3/uL (ref 145–400)
RBC: 4.31 10*6/uL (ref 4.20–5.70)
RDW: 12.4 % (ref 11.1–15.7)
WBC: 8.7 10*3/uL (ref 4.0–10.0)

## 2015-07-12 LAB — LACTATE DEHYDROGENASE: LDH: 208 U/L (ref 125–245)

## 2015-07-12 MED ORDER — SODIUM CHLORIDE 0.9 % IV SOLN
Freq: Once | INTRAVENOUS | Status: AC
  Administered 2015-07-12: 10:00:00 via INTRAVENOUS
  Filled 2015-07-12: qty 4

## 2015-07-12 MED ORDER — DOXORUBICIN HCL LIPOSOMAL CHEMO INJECTION 2 MG/ML
20.0000 mg/m2 | Freq: Once | INTRAVENOUS | Status: AC
  Administered 2015-07-12: 36 mg via INTRAVENOUS
  Filled 2015-07-12: qty 18

## 2015-07-12 MED ORDER — DEXTROSE 5 % IV SOLN
Freq: Once | INTRAVENOUS | Status: AC
  Administered 2015-07-12: 10:00:00 via INTRAVENOUS

## 2015-07-12 NOTE — Progress Notes (Signed)
Hematology and Oncology Follow Up Visit  Todd Vincent [id number] 01-14-1976 40 y.o. 07/12/2015   Principle Diagnosis:  Kaposi's sarcoma - progressive HIV-asymptomatic  Current Therapy:   Doxil- q 4-week dosing    Interim History:  Todd Vincent is here today for a follow-up. He looks quite good. He feels good. This will be his last Doxil for hopefully several months.  He had an echocardiogram in January. His ejection fraction was 50-55%.  He's not noted any new lesions.  His colostomy is working without problems. He will have this reversed in August.  The HIV has been doing quite well. His HIV titers have been undetectable. I think this will "drive" his Kaposi's activity.  His appetite has been quite good. He's had no nausea or vomiting.  He's had a of leg swelling. He does take Lasix on occasion.  He will be going to [facility] in March for a couple weeks. His sister lives down there.   Overall, his performance status is ECOG 1. .   Medications:    Medication List       This list is accurate as of: 07/12/15  9:37 AM.  Always use your most recent med list.               acyclovir 800 MG tablet  Commonly known as:  ZOVIRAX  Take 800 mg by mouth 5 (five) times daily.     ascorbic acid 500 MG tablet  Commonly known as:  VITAMIN C  Take by mouth.     citalopram 10 MG tablet  Commonly known as:  CELEXA  Take 1 tablet (10 mg total) by mouth daily.     COCONUT OIL PO  Take 1,000 mg by mouth 2 (two) times daily.     dapsone 100 MG tablet  TAKE 1 TABLET BY MOUTH DAILY     Darunavir Ethanolate 800 MG tablet  Commonly known as:  PREZISTA  TAKE 1 TABLET BY MOUTH EVERY DAY WITH BREAKFAST     dronabinol 5 MG capsule  Commonly known as:  MARINOL  Take 1 capsule (5 mg total) by mouth 2 (two) times daily before a meal.     ferrous sulfate 325 (65 FE) MG EC tablet  Take by mouth.     gabapentin 300 MG capsule  Commonly known as:  NEURONTIN  Take 1 capsule (300 mg  total) by mouth 3 (three) times daily.     GENVOYA 150-150-200-10 MG Tabs tablet  Generic drug:  elvitegravir-cobicistat-emtricitabine-tenofovir  TAKE 1 TABLET BY MOUTH DAILY.     LORazepam 0.5 MG tablet  Commonly known as:  ATIVAN  Take 1 tablet (0.5 mg total) by mouth every 6 (six) hours as needed (Nausea or vomiting).     meclizine 25 MG tablet  Commonly known as:  ANTIVERT  Take 1 tablet (25 mg total) by mouth every 6 (six) hours as needed.     ondansetron 8 MG tablet  Commonly known as:  ZOFRAN  Take 1 tablet (8 mg total) by mouth 2 (two) times daily. Start the day after chemo for 2 days. Then take as needed for nausea or vomiting.     traZODone 100 MG tablet  Commonly known as:  DESYREL  Take 1 tablet (100 mg total) by mouth at bedtime.     triamterene-hydrochlorothiazide 37.5-25 MG tablet  Commonly known as:  MAXZIDE-25  Take 1 tablet daily if needed for leg swelling     Vitamin D3 2000 units Tabs  Take  2,000 Units by mouth every morning. 2 tabs = 4,000 U daily        Allergies:  Allergies  Allergen Reactions  . Sulfa Antibiotics Rash    Extreme itching     Past Medical History, Surgical history, Social history, and Family History were reviewed and updated.  Review of Systems: All other 10 point review of systems is negative.   Physical Exam:  height is 5\' 7"  (1.702 m) and weight is 149 lb (67.586 kg). His oral temperature is 97.8 F (36.6 C). His blood pressure is 127/77 and his pulse is 88. His respiration is 16.   Wt Readings from Last 3 Encounters:  07/12/15 149 lb (67.586 kg)  06/20/15 151 lb (68.493 kg)  06/14/15 148 lb (67.132 kg)   Well-developed well-nourished African-American male. Head and neck exam shows no ocular or oral lesions. He has no palpable cervical or supraclavicular lymph nodes. Lungs are clear. Cardiac exam regular rate and rhythm with no murmurs, rubs or bruits. Abdomen is soft. Has good bowel sounds. His colostomy in the right  lower quadrant. There is no fluid wave. There is no palpable liver or spleen tip. Back exam shows no tenderness over the spine, ribs or hips. Extremities shows some chronic 1-2+ edema in his lower legs. Has good range of motion of his joints. Skin exam shows the Kaposi's areas. There are slightly hyperpigmented. They are not palpable. Neurological exam is nonfocal.   Lab Results  Component Value Date   WBC 8.7 07/12/2015   HGB 13.8 07/12/2015   HCT 40.1 07/12/2015   MCV 93 07/12/2015   PLT 380 07/12/2015   No results found for: FERRITIN, IRON, TIBC, UIBC, IRONPCTSAT Lab Results  Component Value Date   RBC 4.31 07/12/2015   No results found for: KPAFRELGTCHN, LAMBDASER, KAPLAMBRATIO No results found for: IGGSERUM, IGA, IGMSERUM No results found for: Kathrynn Ducking, MSPIKE, SPEI   Chemistry      Component Value Date/Time   NA 140 06/14/2015 1051   NA 139 03/15/2015 0838   NA 140 12/20/2014 1037   K 3.7 06/14/2015 1051   K 3.8 03/15/2015 0838   K 4.5 12/20/2014 1037   CL 105 06/14/2015 1051   CL 103 12/20/2014 1037   CO2 26 06/14/2015 1051   CO2 24 03/15/2015 0838   CO2 25 12/20/2014 1037   BUN 11 06/14/2015 1051   BUN 10.0 03/15/2015 0838   BUN 11 12/20/2014 1037   CREATININE 1.1 06/14/2015 1051   CREATININE 0.9 03/15/2015 0838   CREATININE 1.02 11/23/2014 0946      Component Value Date/Time   CALCIUM 9.4 06/14/2015 1051   CALCIUM 9.4 03/15/2015 0838   CALCIUM 9.5 12/20/2014 1037   ALKPHOS 68 06/14/2015 1051   ALKPHOS 80 03/15/2015 0838   ALKPHOS 68 12/20/2014 1037   AST 17 06/14/2015 1051   AST 21 03/15/2015 0838   AST 12 12/20/2014 1037   ALT 14 06/14/2015 1051   ALT 21 03/15/2015 0838   ALT 9 12/20/2014 1037   BILITOT 0.60 06/14/2015 1051   BILITOT 0.44 03/15/2015 0838   BILITOT 0.5 12/20/2014 1037     Impression and Plan: Todd Vincent is 40 year old African American gentleman with Kaposi's sarcoma. He is doing well  and responding nicely on Doxil. He is asymptomatic at this time.   We will go ahead and give him his last treatment today. I think we can then just follow him along symptomatically area and  I do still think we have to keep treating him for right now.  His HIV is under good control. I think this will dictate whether or not his Kaposi's needs to be treated.   His last echocardiogram was done in January of this year. He had a good ejection fraction of 50-55%.   He will not have his colostomy reversed until August.   I will plan to see him back in about 2-3 months. I think this would be reasonable.    [name], MD 2/24/20179:37 AM

## 2015-07-12 NOTE — Patient Instructions (Signed)
Doxorubicin Liposomal injection  What is this medicine?  LIPOSOMAL DOXORUBICIN (LIP oh som al dox oh ROO bi sin) is a chemotherapy drug. This medicine is used to treat many kinds of cancer like Kaposi's sarcoma, multiple myeloma, and ovarian cancer.  This medicine may be used for other purposes; ask your health care provider or pharmacist if you have questions.  What should I tell my health care provider before I take this medicine?  They need to know if you have any of these conditions:  -blood disorders  -heart disease  -infection (especially a virus infection such as chickenpox, cold sores, or herpes)  -liver disease  -recent or ongoing radiation therapy  -an unusual or allergic reaction to doxorubicin, other chemotherapy agents, soybeans, other medicines, foods, dyes, or preservatives  -pregnant or trying to get pregnant  -breast-feeding  How should I use this medicine?  This drug is given as an infusion into a vein. It is administered in a hospital or clinic by a specially trained health care professional. If you have pain, swelling, burning or any unusual feeling around the site of your injection, tell your health care professional right away.  Talk to your pediatrician regarding the use of this medicine in children. Special care may be needed.  Overdosage: If you think you have taken too much of this medicine contact a poison control center or emergency room at once.  NOTE: This medicine is only for you. Do not share this medicine with others.  What if I miss a dose?  It is important not to miss your dose. Call your doctor or health care professional if you are unable to keep an appointment.  What may interact with this medicine?  Do not take this medicine with any of the following medications:  -zidovudine  This medicine may also interact with the following medications:  -medicines to increase blood counts like filgrastim, pegfilgrastim, sargramostim  -vaccines  Talk to your doctor or health care  professional before taking any of these medicines:  -acetaminophen  -aspirin  -ibuprofen  -ketoprofen  -naproxen  This list may not describe all possible interactions. Give your health care provider a list of all the medicines, herbs, non-prescription drugs, or dietary supplements you use. Also tell them if you smoke, drink alcohol, or use illegal drugs. Some items may interact with your medicine.  What should I watch for while using this medicine?  Your condition will be monitored carefully while you are receiving this medicine. You will need important blood work done while you are taking this medicine.  This drug may make you feel generally unwell. This is not uncommon, as chemotherapy can affect healthy cells as well as cancer cells. Report any side effects. Continue your course of treatment even though you feel ill unless your doctor tells you to stop.  Your urine may turn orange-red for a few days after your dose. This is not blood. If your urine is dark or brown, call your doctor.  In some cases, you may be given additional medicines to help with side effects. Follow all directions for their use.  Call your doctor or health care professional for advice if you get a fever (100.5 degrees F or higher), chills or sore throat, or other symptoms of a cold or flu. Do not treat yourself. This drug decreases your body's ability to fight infections. Try to avoid being around people who are sick.  This medicine may increase your risk to bruise or bleed. Call your doctor or   you are receiving this medicine. Avoid taking products that contain aspirin, acetaminophen, ibuprofen, naproxen, or ketoprofen unless instructed by your doctor. These medicines may hide a fever. Men and women of  childbearing age should use effective birth control methods while using taking this medicine. Do not become pregnant while taking this medicine. There is a potential for serious side effects to an unborn child. Talk to your health care professional or pharmacist for more information. Do not breast-feed an infant while taking this medicine. Talk to your doctor about your risk of cancer. You may be more at risk for certain types of cancers if you take this medicine. What side effects may I notice from receiving this medicine? Side effects that you should report to your doctor or health care professional as soon as possible: -allergic reactions like skin rash, itching or hives, swelling of the face, lips, or tongue -low blood counts - this medicine may decrease the number of white blood cells, red blood cells and platelets. You may be at increased risk for infections and bleeding. -signs of hand-foot syndrome - tingling or burning, redness, flaking, swelling, small blisters, or small sores on the palms of your hands or the soles of your feet -signs of infection - fever or chills, cough, sore throat, pain or difficulty passing urine -signs of decreased platelets or bleeding - bruising, pinpoint red spots on the skin, black, tarry stools, blood in the urine -signs of decreased red blood cells - unusually weak or tired, fainting spells, lightheadedness -back pain, chills, facial flushing, fever, headache, tightness in the chest or throat during the infusion -breathing problems -chest pain -fast, irregular heartbeat -mouth pain, redness, sores -pain, swelling, redness at site where injected -pain, tingling, numbness in the hands or feet -swelling of ankles, feet, or hands -vomiting Side effects that usually do not require medical attention (report to your doctor or health care professional if they continue or are bothersome): -diarrhea -hair loss -loss of appetite -nail discoloration or  damage -nausea -red or watery eyes -red colored urine -stomach upset This list may not describe all possible side effects. Call your doctor for medical advice about side effects. You may report side effects to FDA at 1-800-FDA-1088. Where should I keep my medicine? This drug is given in a hospital or clinic and will not be stored at home. NOTE: This sheet is a summary. It may not cover all possible information. If you have questions about this medicine, talk to your doctor, pharmacist, or health care provider.    2016, [employer]. (2012-01-22 10:12:56)

## 2015-07-18 DIAGNOSIS — K611 Rectal abscess: Secondary | ICD-10-CM | POA: Diagnosis not present

## 2015-07-18 DIAGNOSIS — Z932 Ileostomy status: Secondary | ICD-10-CM | POA: Diagnosis not present

## 2015-07-18 DIAGNOSIS — K6289 Other specified diseases of anus and rectum: Secondary | ICD-10-CM | POA: Diagnosis not present

## 2015-07-24 ENCOUNTER — Other Ambulatory Visit: Payer: Self-pay | Admitting: Hematology & Oncology

## 2015-07-26 ENCOUNTER — Other Ambulatory Visit: Payer: Self-pay | Admitting: Hematology & Oncology

## 2015-07-26 DIAGNOSIS — C469 Kaposi's sarcoma, unspecified: Secondary | ICD-10-CM

## 2015-07-29 MED ORDER — DRONABINOL 5 MG PO CAPS: 5.0000 mg | ORAL_CAPSULE | Freq: Two times a day (BID) | ORAL | Status: DC

## 2015-07-29 MED ORDER — GABAPENTIN 300 MG PO CAPS: 300.0000 mg | ORAL_CAPSULE | Freq: Three times a day (TID) | ORAL | Status: DC

## 2015-07-29 MED ORDER — TRAZODONE HCL 100 MG PO TABS: 100.0000 mg | ORAL_TABLET | Freq: Every day | ORAL | Status: DC

## 2015-07-29 MED ORDER — MECLIZINE HCL 25 MG PO TABS: 25.0000 mg | ORAL_TABLET | Freq: Four times a day (QID) | ORAL | Status: DC | PRN

## 2015-08-23 ENCOUNTER — Other Ambulatory Visit: Payer: Self-pay | Admitting: Internal Medicine

## 2015-08-23 DIAGNOSIS — B20 Human immunodeficiency virus [HIV] disease: Secondary | ICD-10-CM

## 2015-08-23 MED ORDER — DARUNAVIR ETHANOLATE 800 MG PO TABS: ORAL_TABLET | ORAL | Status: DC

## 2015-08-29 ENCOUNTER — Other Ambulatory Visit (HOSPITAL_COMMUNITY)
Admission: RE | Admit: 2015-08-29 | Discharge: 2015-08-29 | Disposition: A | Discharge status: Home / Self Care | Payer: Medicaid Other | Source: Ambulatory Visit | Attending: Internal Medicine | Admitting: Internal Medicine

## 2015-08-29 ENCOUNTER — Encounter: Payer: Self-pay | Admitting: Internal Medicine

## 2015-08-29 ENCOUNTER — Ambulatory Visit (INDEPENDENT_AMBULATORY_CARE_PROVIDER_SITE_OTHER): Payer: Medicaid Other | Admitting: Internal Medicine

## 2015-08-29 VITALS — BP 118/77 | HR 85 | Temp 98.6°F | Ht 67.0 in | Wt 147.0 lb

## 2015-08-29 DIAGNOSIS — C469 Kaposi's sarcoma, unspecified: Secondary | ICD-10-CM

## 2015-08-29 DIAGNOSIS — Z79899 Other long term (current) drug therapy: Secondary | ICD-10-CM

## 2015-08-29 DIAGNOSIS — Z113 Encounter for screening for infections with a predominantly sexual mode of transmission: Secondary | ICD-10-CM

## 2015-08-29 DIAGNOSIS — Z21 Asymptomatic human immunodeficiency virus [HIV] infection status: Secondary | ICD-10-CM

## 2015-08-29 DIAGNOSIS — B20 Human immunodeficiency virus [HIV] disease: Secondary | ICD-10-CM

## 2015-08-29 LAB — COMPLETE METABOLIC PANEL WITH GFR
ALBUMIN: 4.2 g/dL (ref 3.6–5.1)
ALK PHOS: 73 U/L (ref 40–115)
ALT: 8 U/L — ABNORMAL LOW (ref 9–46)
AST: 11 U/L (ref 10–40)
BUN: 9 mg/dL (ref 7–25)
CALCIUM: 9 mg/dL (ref 8.6–10.3)
CHLORIDE: 106 mmol/L (ref 98–110)
CO2: 23 mmol/L (ref 20–31)
Creat: 1.14 mg/dL (ref 0.60–1.35)
GFR, EST NON AFRICAN AMERICAN: 81 mL/min (ref >=60)
GFR, Est African American: >89 mL/min (ref >=60)
GLUCOSE: 95 mg/dL (ref 65–99)
POTASSIUM: 4.2 mmol/L (ref 3.5–5.3)
SODIUM: 139 mmol/L (ref 135–146)
Total Bilirubin: 0.4 mg/dL (ref 0.2–1.2)
Total Protein: 6.9 g/dL (ref 6.1–8.1)

## 2015-08-29 LAB — CBC WITH DIFFERENTIAL/PLATELET
BASOS PCT: 0 %
Basophils Absolute: 0 cells/uL (ref 0–200)
EOS PCT: 3 %
Eosinophils Absolute: 159 cells/uL (ref 15–500)
HEMATOCRIT: 39.3 % (ref 38.5–50.0)
HEMOGLOBIN: 13.3 g/dL (ref 13.2–17.1)
LYMPHS ABS: 1484 {cells}/uL (ref 850–3900)
Lymphocytes Relative: 28 %
MCH: 33.3 pg — ABNORMAL HIGH (ref 27.0–33.0)
MCHC: 33.8 g/dL (ref 32.0–36.0)
MCV: 98.3 fL (ref 80.0–100.0)
MONO ABS: 477 {cells}/uL (ref 200–950)
MPV: 9.3 fL (ref 7.5–12.5)
Monocytes Relative: 9 %
NEUTROS ABS: 3180 {cells}/uL (ref 1500–7800)
Neutrophils Relative %: 60 %
Platelets: 236 10*3/uL (ref 140–400)
RBC: 4 MIL/uL — AB (ref 4.20–5.80)
RDW: 13.8 % (ref 11.0–15.0)
WBC: 5.3 10*3/uL (ref 3.8–10.8)

## 2015-08-29 LAB — LIPID PANEL
CHOL/HDL RATIO: 2.6 ratio (ref <=5.0)
CHOLESTEROL: 171 mg/dL (ref 125–200)
HDL: 66 mg/dL (ref >=40)
LDL CALC: 88 mg/dL (ref <130)
TRIGLYCERIDES: 84 mg/dL (ref <150)
VLDL: 17 mg/dL (ref <30)

## 2015-08-29 NOTE — Progress Notes (Signed)
  Subjective:    Patient ID: Todd Vincent, male    DOB: 1975/06/18, 40 y.o.   MRN: [id number]  HPI He comes in for routine follow up of HIV.   He was on Isentress and truvada, prezista, norvir and I changed him to [place] last year.  Some minimal initial effects but no issues now. He has been on doxil for Kaposi's sarcoma but now on a 6 month break.   His CD4 last was 190.  No new issues.  No weight loss, trying to maintain weight.  no diarrhea. No new complaints.   Getting reversal of ostomy potentially in the Fall.     Review of Systems  Constitutional: Negative for fever and chills.  HENT: Negative for sore throat and trouble swallowing.   Gastrointestinal: Negative for nausea and diarrhea.  Skin: Negative for rash.  Neurological: Negative for dizziness.  Psychiatric/Behavioral: Negative for dysphoric mood.       Objective:   Physical Exam  Constitutional: He appears well-developed and well-nourished. No distress.  HENT:  Mouth/Throat: No oropharyngeal exudate.  Eyes: No scleral icterus.  Cardiovascular: Normal rate, regular rhythm and normal heart sounds.   No murmur heard. Pulmonary/Chest: Effort normal and breath sounds normal. No respiratory distress.  Lymphadenopathy:    He has no cervical adenopathy.  Skin: No rash noted.             Assessment & Plan:

## 2015-08-29 NOTE — Assessment & Plan Note (Signed)
Doing well.  Labs today and rtc 4 months.  

## 2015-08-29 NOTE — Assessment & Plan Note (Signed)
Continuing to follow Dr. Marin Olp.

## 2015-08-30 LAB — T-HELPER CELL (CD4) - (RCID CLINIC ONLY)
CD4 % Helper T Cell: 17 % — ABNORMAL LOW (ref 33–55)
CD4 T Cell Abs: 260 /uL — ABNORMAL LOW (ref 400–2700)

## 2015-08-30 LAB — URINE CYTOLOGY ANCILLARY ONLY
CHLAMYDIA, DNA PROBE: NEGATIVE
Neisseria Gonorrhea: NEGATIVE

## 2015-08-30 LAB — RPR

## 2015-08-30 LAB — HIV-1 RNA QUANT-NO REFLEX-BLD
HIV 1 RNA QUANT: 22 {copies}/mL — AB (ref <20)
HIV-1 RNA QUANT, LOG: 1.34 {Log_copies}/mL — AB (ref <1.30)

## 2015-09-13 ENCOUNTER — Encounter: Payer: Self-pay | Admitting: Hematology & Oncology

## 2015-09-13 ENCOUNTER — Ambulatory Visit (HOSPITAL_BASED_OUTPATIENT_CLINIC_OR_DEPARTMENT_OTHER): Payer: Medicaid Other | Admitting: Family

## 2015-09-13 ENCOUNTER — Other Ambulatory Visit (HOSPITAL_BASED_OUTPATIENT_CLINIC_OR_DEPARTMENT_OTHER): Payer: Medicaid Other

## 2015-09-13 VITALS — BP 111/78 | HR 88 | Temp 98.1°F | Resp 16 | Ht 67.0 in | Wt 146.0 lb

## 2015-09-13 DIAGNOSIS — C469 Kaposi's sarcoma, unspecified: Secondary | ICD-10-CM

## 2015-09-13 DIAGNOSIS — Z21 Asymptomatic human immunodeficiency virus [HIV] infection status: Secondary | ICD-10-CM

## 2015-09-13 LAB — CBC WITH DIFFERENTIAL (CANCER CENTER ONLY)
BASO#: 0 10*3/uL (ref 0.0–0.2)
BASO%: 0.3 % (ref 0.0–2.0)
EOS%: 3.9 % (ref 0.0–7.0)
Eosinophils Absolute: 0.2 10*3/uL (ref 0.0–0.5)
HEMATOCRIT: 41.8 % (ref 38.7–49.9)
HGB: 14.3 g/dL (ref 13.0–17.1)
LYMPH#: 3 10*3/uL (ref 0.9–3.3)
LYMPH%: 51.2 % — ABNORMAL HIGH (ref 14.0–48.0)
MCH: 33.4 pg (ref 28.0–33.4)
MCHC: 34.2 g/dL (ref 32.0–35.9)
MCV: 98 fL (ref 82–98)
MONO#: 0.5 10*3/uL (ref 0.1–0.9)
MONO%: 9.1 % (ref 0.0–13.0)
NEUT#: 2.1 10*3/uL (ref 1.5–6.5)
NEUT%: 35.5 % — AB (ref 40.0–80.0)
PLATELETS: 242 10*3/uL (ref 145–400)
RBC: 4.28 10*6/uL (ref 4.20–5.70)
RDW: 12.4 % (ref 11.1–15.7)
WBC: 5.9 10*3/uL (ref 4.0–10.0)

## 2015-09-13 LAB — COMPREHENSIVE METABOLIC PANEL
ALT: 13 U/L (ref 0–55)
AST: 15 U/L (ref 5–34)
Albumin: 4.2 g/dL (ref 3.5–5.0)
Alkaline Phosphatase: 81 U/L (ref 40–150)
Anion Gap: 9 mEq/L (ref 3–11)
BUN: 11.1 mg/dL (ref 7.0–26.0)
CO2: 26 meq/L (ref 22–29)
Calcium: 9.3 mg/dL (ref 8.4–10.4)
Chloride: 106 mEq/L (ref 98–109)
Creatinine: 1.2 mg/dL (ref 0.7–1.3)
EGFR: 87 mL/min/{1.73_m2} — ABNORMAL LOW (ref >90)
GLUCOSE: 108 mg/dL (ref 70–140)
POTASSIUM: 3.8 meq/L (ref 3.5–5.1)
SODIUM: 141 meq/L (ref 136–145)
Total Bilirubin: 0.6 mg/dL (ref 0.20–1.20)
Total Protein: 7.5 g/dL (ref 6.4–8.3)

## 2015-09-13 LAB — LACTATE DEHYDROGENASE: LDH: 182 U/L (ref 125–245)

## 2015-09-13 NOTE — Progress Notes (Signed)
Hematology and Oncology Follow Up Visit  Todd Vincent [id number] 10-25-1975 40 y.o. 09/13/2015   Principle Diagnosis:  Kaposi's sarcoma - progressive HIV-asymptomatic  Current Therapy:   Doxil- q 4 week dosing - currently on 6 month break    Interim History:  Todd Vincent is here today for a follow-up. He is doing well and has no complaints at this time. He has no new skin lesions or rashes. He has some hyperpigmentation that continues to fade from previous break out. He continues to be followed closely by ID and his HIV levels have been stable.  No fatigue, fever, chills, n/v, cough, dizziness, headaches, blurred vision, SOB, chest pain, palpitations, abdominal pain or changes in bladder habits.  His temporary ostomy is functioning appropriately. He will hopefully be having his reversal in the early fall.  He is on diuretics to help prevent swelling in his extremities. He has chronic "puffiness" in his ankles and feet. No numbness or tingling in his extremities.  He is appetite is improving and he is still supplementing between meals with boost. He is staying well hydrated. His weight is stable.  His Echo in January showed an EF of 50-55%.   Medications:    Medication List       This list is accurate as of: 09/13/15  9:32 AM.  Always use your most recent med list.               acyclovir 800 MG tablet  Commonly known as:  ZOVIRAX  Take 800 mg by mouth 5 (five) times daily.     ascorbic acid 500 MG tablet  Commonly known as:  VITAMIN C  Take by mouth.     citalopram 10 MG tablet  Commonly known as:  CELEXA  Take 1 tablet (10 mg total) by mouth daily.     COCONUT OIL PO  Take 1,000 mg by mouth 2 (two) times daily.     dapsone 100 MG tablet  TAKE 1 TABLET BY MOUTH DAILY     Darunavir Ethanolate 800 MG tablet  Commonly known as:  PREZISTA  TAKE 1 TABLET BY MOUTH EVERY DAY WITH BREAKFAST     dronabinol 5 MG capsule  Commonly known as:  MARINOL  Take 1 capsule (5 mg  total) by mouth 2 (two) times daily before lunch and supper.     ferrous sulfate 325 (65 FE) MG EC tablet  Take by mouth.     gabapentin 300 MG capsule  Commonly known as:  NEURONTIN  Take 1 capsule (300 mg total) by mouth 3 (three) times daily.     GENVOYA 150-150-200-10 MG Tabs tablet  Generic drug:  elvitegravir-cobicistat-emtricitabine-tenofovir  TAKE 1 TABLET BY MOUTH DAILY.     LORazepam 0.5 MG tablet  Commonly known as:  ATIVAN  Take 1 tablet (0.5 mg total) by mouth every 6 (six) hours as needed (Nausea or vomiting).     meclizine 25 MG tablet  Commonly known as:  ANTIVERT  Take 1 tablet (25 mg total) by mouth every 6 (six) hours as needed.     ondansetron 8 MG tablet  Commonly known as:  ZOFRAN  Take 1 tablet (8 mg total) by mouth 2 (two) times daily. Start the day after chemo for 2 days. Then take as needed for nausea or vomiting.     traZODone 100 MG tablet  Commonly known as:  DESYREL  Take 1 tablet (100 mg total) by mouth at bedtime.     triamterene-hydrochlorothiazide  37.5-25 MG tablet  Commonly known as:  MAXZIDE-25  Take 1 tablet daily if needed for leg swelling     Vitamin D3 2000 units Tabs  Take 2,000 Units by mouth every morning. 2 tabs = 4,000 U daily        Allergies:  Allergies  Allergen Reactions  . Sulfa Antibiotics Rash    Extreme itching     Past Medical History, Surgical history, Social history, and Family History were reviewed and updated.  Review of Systems: All other 10 point review of systems is negative.   Physical Exam:  height is 5\' 7"  (1.702 m) and weight is 146 lb (66.225 kg). His oral temperature is 98.1 F (36.7 C). His blood pressure is 111/78 and his pulse is 88. His respiration is 16.   Wt Readings from Last 3 Encounters:  09/13/15 146 lb (66.225 kg)  08/29/15 147 lb (66.679 kg)  07/12/15 149 lb (67.586 kg)    Ocular: Sclerae unicteric, pupils equal, round and reactive to light Ear-nose-throat: Oropharynx clear,  dentition fair Lymphatic: No cervical supraclavicular or axillary adenopathy Lungs no rales or rhonchi, good excursion bilaterally Heart regular rate and rhythm, no murmur appreciated Abd soft, nontender, positive bowel sounds, no liver or spleen tip palpated on exam, no fluid wave MSK no focal spinal tenderness, no joint edema Neuro: non-focal, well-oriented, appropriate affect  Lab Results  Component Value Date   WBC 5.9 09/13/2015   HGB 14.3 09/13/2015   HCT 41.8 09/13/2015   MCV 98 09/13/2015   PLT 242 09/13/2015   No results found for: FERRITIN, IRON, TIBC, UIBC, IRONPCTSAT Lab Results  Component Value Date   RBC 4.28 09/13/2015   No results found for: KPAFRELGTCHN, LAMBDASER, KAPLAMBRATIO No results found for: IGGSERUM, IGA, IGMSERUM No results found for: Kathrynn Ducking, MSPIKE, SPEI   Chemistry      Component Value Date/Time   NA 139 08/29/2015 1004   NA 135 07/12/2015 0854   NA 139 03/15/2015 0838   K 4.2 08/29/2015 1004   K 3.3 07/12/2015 0854   K 3.8 03/15/2015 0838   CL 106 08/29/2015 1004   CL 103 07/12/2015 0854   CO2 23 08/29/2015 1004   CO2 27 07/12/2015 0854   CO2 24 03/15/2015 0838   BUN 9 08/29/2015 1004   BUN 11 07/12/2015 0854   BUN 10.0 03/15/2015 0838   CREATININE 1.14 08/29/2015 1004   CREATININE 0.9 03/15/2015 0838   CREATININE 1.02 11/23/2014 0946      Component Value Date/Time   CALCIUM 9.0 08/29/2015 1004   CALCIUM 9.2 07/12/2015 0854   CALCIUM 9.4 03/15/2015 0838   ALKPHOS 73 08/29/2015 1004   ALKPHOS 79 07/12/2015 0854   ALKPHOS 80 03/15/2015 0838   AST 11 08/29/2015 1004   AST 15 07/12/2015 0854   AST 21 03/15/2015 0838   ALT 8* 08/29/2015 1004   ALT 14 07/12/2015 0854   ALT 21 03/15/2015 0838   BILITOT 0.4 08/29/2015 1004   BILITOT 0.50 07/12/2015 0854   BILITOT 0.44 03/15/2015 0838     Impression and Plan: Todd Vincent is 40 year old African American gentleman with Kaposi's sarcoma.  He is doing well and enjoying his 6 month break from treatment. He has had no new rash or lesions appear. He is asymptomatic at this time.  He is planning to have his ostomy reversal surgery in the early fall.  We will plan to see him back in 3 months for labs  and follow-up. We will re-evaluate starting him back on Doxil at that time.  He will contact us with any questions or concerns. We can certainly see him sooner if need be.   Eliezer Bottom, NP 4/28/20179:32 AM

## 2015-09-22 ENCOUNTER — Other Ambulatory Visit: Payer: Self-pay | Admitting: Hematology & Oncology

## 2015-09-23 ENCOUNTER — Other Ambulatory Visit: Payer: Self-pay | Admitting: Hematology & Oncology

## 2015-10-17 ENCOUNTER — Other Ambulatory Visit: Payer: Self-pay | Admitting: Internal Medicine

## 2015-10-17 ENCOUNTER — Other Ambulatory Visit: Payer: Self-pay | Admitting: Hematology & Oncology

## 2015-10-22 ENCOUNTER — Other Ambulatory Visit: Payer: Self-pay | Admitting: Hematology & Oncology

## 2015-11-22 ENCOUNTER — Other Ambulatory Visit: Payer: Self-pay | Admitting: Hematology & Oncology

## 2015-11-25 MED ORDER — DRONABINOL 5 MG PO CAPS: 5.0000 mg | ORAL_CAPSULE | Freq: Two times a day (BID) | ORAL | Status: DC

## 2015-11-25 MED ORDER — GABAPENTIN 300 MG PO CAPS: 300.0000 mg | ORAL_CAPSULE | Freq: Three times a day (TID) | ORAL | Status: DC

## 2015-11-25 MED ORDER — TRIAMTERENE-HCTZ 37.5-25 MG PO TABS: 1.0000 | ORAL_TABLET | Freq: Every day | ORAL | Status: DC | PRN

## 2015-11-25 MED ORDER — MECLIZINE HCL 25 MG PO TABS: 25.0000 mg | ORAL_TABLET | Freq: Four times a day (QID) | ORAL | Status: DC | PRN

## 2015-11-25 MED ORDER — TRAZODONE HCL 100 MG PO TABS: 100.0000 mg | ORAL_TABLET | Freq: Every day | ORAL | Status: DC

## 2015-11-25 NOTE — Addendum Note (Signed)
Addended by: Rico Ala on: 11/25/2015 09:12 AM   Modules accepted: Orders

## 2015-11-29 ENCOUNTER — Telehealth: Payer: Self-pay

## 2015-11-29 ENCOUNTER — Other Ambulatory Visit: Payer: Self-pay

## 2015-11-29 MED ORDER — DRONABINOL 5 MG PO CAPS: 5.0000 mg | ORAL_CAPSULE | Freq: Two times a day (BID) | ORAL | Status: DC

## 2015-11-29 NOTE — Telephone Encounter (Signed)
Received call from pharmacist at [facility] in [place], [place]. Pt has been getting medications at [facility] in [place], but has transferred all scripts to [facility] location d/t pt moving. They are able to transfer all scripts except Marinol. Need new script faxed to them at [phone]. Ok per Dr Marin Olp. dph

## 2015-12-06 DIAGNOSIS — Z8719 Personal history of other diseases of the digestive system: Secondary | ICD-10-CM | POA: Diagnosis not present

## 2015-12-13 ENCOUNTER — Ambulatory Visit (HOSPITAL_BASED_OUTPATIENT_CLINIC_OR_DEPARTMENT_OTHER): Payer: Medicare Other | Admitting: Family

## 2015-12-13 ENCOUNTER — Other Ambulatory Visit (HOSPITAL_BASED_OUTPATIENT_CLINIC_OR_DEPARTMENT_OTHER): Payer: Medicare Other

## 2015-12-13 VITALS — BP 122/84 | HR 84 | Temp 97.9°F | Resp 16 | Wt 149.0 lb

## 2015-12-13 DIAGNOSIS — Z21 Asymptomatic human immunodeficiency virus [HIV] infection status: Secondary | ICD-10-CM | POA: Diagnosis not present

## 2015-12-13 DIAGNOSIS — C469 Kaposi's sarcoma, unspecified: Secondary | ICD-10-CM

## 2015-12-13 LAB — CBC WITH DIFFERENTIAL (CANCER CENTER ONLY)
BASO#: 0 10*3/uL (ref 0.0–0.2)
BASO%: 0.3 % (ref 0.0–2.0)
EOS%: 2.7 % (ref 0.0–7.0)
Eosinophils Absolute: 0.2 10*3/uL (ref 0.0–0.5)
HCT: 41.8 % (ref 38.7–49.9)
HGB: 14.3 g/dL (ref 13.0–17.1)
LYMPH#: 2.5 10*3/uL (ref 0.9–3.3)
LYMPH%: 36.7 % (ref 14.0–48.0)
MCH: 32.9 pg (ref 28.0–33.4)
MCHC: 34.2 g/dL (ref 32.0–35.9)
MCV: 96 fL (ref 82–98)
MONO#: 0.5 10*3/uL (ref 0.1–0.9)
MONO%: 7.4 % (ref 0.0–13.0)
NEUT#: 3.6 10*3/uL (ref 1.5–6.5)
NEUT%: 52.9 % (ref 40.0–80.0)
PLATELETS: 267 10*3/uL (ref 145–400)
RBC: 4.34 10*6/uL (ref 4.20–5.70)
RDW: 12.7 % (ref 11.1–15.7)
WBC: 6.8 10*3/uL (ref 4.0–10.0)

## 2015-12-13 LAB — COMPREHENSIVE METABOLIC PANEL
ALT: 13 U/L (ref 0–55)
AST: 14 U/L (ref 5–34)
Albumin: 4.1 g/dL (ref 3.5–5.0)
Alkaline Phosphatase: 78 U/L (ref 40–150)
Anion Gap: 7 mEq/L (ref 3–11)
BILIRUBIN TOTAL: 0.4 mg/dL (ref 0.20–1.20)
BUN: 8.2 mg/dL (ref 7.0–26.0)
CHLORIDE: 108 meq/L (ref 98–109)
CO2: 26 meq/L (ref 22–29)
CREATININE: 1.2 mg/dL (ref 0.7–1.3)
Calcium: 9.2 mg/dL (ref 8.4–10.4)
EGFR: >90 mL/min/{1.73_m2} (ref >90)
GLUCOSE: 111 mg/dL (ref 70–140)
Potassium: 4 mEq/L (ref 3.5–5.1)
Sodium: 141 mEq/L (ref 136–145)
TOTAL PROTEIN: 7.2 g/dL (ref 6.4–8.3)

## 2015-12-13 LAB — LACTATE DEHYDROGENASE: LDH: 191 U/L (ref 125–245)

## 2015-12-13 NOTE — Progress Notes (Signed)
Hematology and Oncology Follow Up Visit  FRANZ ASELTINE [id number] December 22, 1975 40 y.o. 12/13/2015   Principle Diagnosis:  Kaposi's sarcoma - progressive HIV-asymptomatic  Current Therapy:   Doxil- q 4 week dosing    Interim History:  Mr. Licea is here today for a follow-up. He is having problems with vertigo and dizziness. He has had multiple falls due to the effects of this on his balance. Thankfully he has not been injured. He has a follow-up scheduled with his PCP regarding this and possible mediciation changes.  He has had a 6 month break from treatment and done well. He has no new lesions or rash but his old areas have become a little more pigmented.  He follows up with ID dr. Linus Salmons in August. RPR in April was non reactive, HIV 1 RNA was mildly elevated.  No fatigue, fever, chills, n/v, cough, headaches, blurred vision, SOB, chest pain, palpitations, abdominal pain or changes in bladder habits.  His temporary ostomy is functioning appropriately. He is still waiting to have his ostomy reversal surgery. This has again been pushed out to potentially in the fall.  He is on diuretics to help prevent swelling in his extremities. He has chronic "puffiness" in his ankles and feet. No numbness or tingling in his extremities.  He is eating well and staying hydrated. He still supplements with boost. His weight is up 3 lbs since his last visit.  His Echo in January showed an EF of 50-55%. We will repeat this next week.  Medications:    Medication List       Accurate as of 12/13/15  8:54 AM. Always use your most recent med list.          acyclovir 800 MG tablet Commonly known as:  ZOVIRAX Take 800 mg by mouth 5 (five) times daily.   ascorbic acid 500 MG tablet Commonly known as:  VITAMIN C Take by mouth.   citalopram 10 MG tablet Commonly known as:  CELEXA Take 1 tablet (10 mg total) by mouth daily.   COCONUT OIL PO Take 1,000 mg by mouth 2 (two) times daily.   dapsone 100 MG  tablet TAKE 1 TABLET BY MOUTH DAILY   Darunavir Ethanolate 800 MG tablet Commonly known as:  PREZISTA TAKE 1 TABLET BY MOUTH EVERY DAY WITH BREAKFAST   dronabinol 5 MG capsule Commonly known as:  MARINOL Take 1 capsule (5 mg total) by mouth 2 (two) times daily before lunch and supper.   ferrous sulfate 325 (65 FE) MG EC tablet Take by mouth.   gabapentin 300 MG capsule Commonly known as:  NEURONTIN TAKE 1 CAPSULE(300 MG) BY MOUTH THREE TIMES DAILY   gabapentin 300 MG capsule Commonly known as:  NEURONTIN Take 1 capsule (300 mg total) by mouth 3 (three) times daily.   GENVOYA 150-150-200-10 MG Tabs tablet Generic drug:  elvitegravir-cobicistat-emtricitabine-tenofovir TAKE 1 TABLET BY MOUTH DAILY.   LORazepam 0.5 MG tablet Commonly known as:  ATIVAN Take 1 tablet (0.5 mg total) by mouth every 6 (six) hours as needed (Nausea or vomiting).   meclizine 25 MG tablet Commonly known as:  ANTIVERT TAKE 1 TABLET(25 MG) BY MOUTH EVERY 6 HOURS AS NEEDED   meclizine 25 MG tablet Commonly known as:  ANTIVERT Take 1 tablet (25 mg total) by mouth every 6 (six) hours as needed for dizziness.   ondansetron 8 MG tablet Commonly known as:  ZOFRAN Take 1 tablet (8 mg total) by mouth 2 (two) times daily. Start the day  after chemo for 2 days. Then take as needed for nausea or vomiting.   traZODone 100 MG tablet Commonly known as:  DESYREL TAKE 1 TABLET(100 MG) BY MOUTH AT BEDTIME   traZODone 100 MG tablet Commonly known as:  DESYREL Take 1 tablet (100 mg total) by mouth at bedtime.   triamterene-hydrochlorothiazide 37.5-25 MG tablet Commonly known as:  MAXZIDE-25 TAKE 1 TABLET BY MOUTH DAILY AS NEEDED FOR LEG SWELLING   triamterene-hydrochlorothiazide 37.5-25 MG tablet Commonly known as:  MAXZIDE-25 Take 1 tablet by mouth daily as needed. For leg swelling   Vitamin D3 2000 units Tabs Take 2,000 Units by mouth every morning. 2 tabs = 4,000 U daily       Allergies:  Allergies    Allergen Reactions  . Sulfa Antibiotics Rash    Extreme itching     Past Medical History, Surgical history, Social history, and Family History were reviewed and updated.  Review of Systems: All other 10 point review of systems is negative.   Physical Exam:  weight is 149 lb (67.6 kg). His oral temperature is 97.9 F (36.6 C). His blood pressure is 122/84 and his pulse is 84. His respiration is 16.   Wt Readings from Last 3 Encounters:  12/13/15 149 lb (67.6 kg)  09/13/15 146 lb (66.2 kg)  08/29/15 147 lb (66.7 kg)    Ocular: Sclerae unicteric, pupils equal, round and reactive to light Ear-nose-throat: Oropharynx clear, dentition fair Lymphatic: No cervical supraclavicular or axillary adenopathy Lungs no rales or rhonchi, good excursion bilaterally Heart regular rate and rhythm, no murmur appreciated Abd soft, nontender, positive bowel sounds, no liver or spleen tip palpated on exam, no fluid wave MSK no focal spinal tenderness, no joint edema Neuro: non-focal, well-oriented, appropriate affect  Lab Results  Component Value Date   WBC 6.8 12/13/2015   HGB 14.3 12/13/2015   HCT 41.8 12/13/2015   MCV 96 12/13/2015   PLT 267 12/13/2015   No results found for: FERRITIN, IRON, TIBC, UIBC, IRONPCTSAT Lab Results  Component Value Date   RBC 4.34 12/13/2015   No results found for: KPAFRELGTCHN, LAMBDASER, KAPLAMBRATIO No results found for: IGGSERUM, IGA, IGMSERUM No results found for: Odetta Pink, SPEI   Chemistry      Component Value Date/Time   NA 141 09/13/2015 0844   K 3.8 09/13/2015 0844   CL 106 08/29/2015 1004   CL 103 07/12/2015 0854   CO2 26 09/13/2015 0844   BUN 11.1 09/13/2015 0844   CREATININE 1.2 09/13/2015 0844      Component Value Date/Time   CALCIUM 9.3 09/13/2015 0844   ALKPHOS 81 09/13/2015 0844   AST 15 09/13/2015 0844   ALT 13 09/13/2015 0844   BILITOT 0.60 09/13/2015 0844      Impression and Plan: Mr. Abila is 41 yo African American gentleman with Kaposi's sarcoma. He has had a 6 month break from treatment but has noticed that his old lesion spots are becoming more pigmented.  We will repeat an Echocardiogram next week and then look at getting him scheduled for treatment again.  He is following up with his PCP regarding the vertigo. Hopefully this will resolve soon.  I went ahead and put him in for a follow-up in 1 month.  He will contact us with any questions or concerns. We can certainly see him sooner if need be.   Eliezer Bottom, NP 7/28/20178:54 AM

## 2015-12-18 ENCOUNTER — Ambulatory Visit (HOSPITAL_BASED_OUTPATIENT_CLINIC_OR_DEPARTMENT_OTHER)
Admission: RE | Admit: 2015-12-18 | Discharge: 2015-12-18 | Disposition: A | Discharge status: Home / Self Care | Payer: Medicare Other | Source: Ambulatory Visit | Attending: Family | Admitting: Family

## 2015-12-18 DIAGNOSIS — Z720 Tobacco use: Secondary | ICD-10-CM | POA: Insufficient documentation

## 2015-12-18 DIAGNOSIS — I071 Rheumatic tricuspid insufficiency: Secondary | ICD-10-CM | POA: Insufficient documentation

## 2015-12-18 DIAGNOSIS — Z0189 Encounter for other specified special examinations: Secondary | ICD-10-CM

## 2015-12-18 DIAGNOSIS — B20 Human immunodeficiency virus [HIV] disease: Secondary | ICD-10-CM | POA: Diagnosis not present

## 2015-12-18 DIAGNOSIS — Z09 Encounter for follow-up examination after completed treatment for conditions other than malignant neoplasm: Secondary | ICD-10-CM | POA: Diagnosis present

## 2015-12-18 DIAGNOSIS — C469 Kaposi's sarcoma, unspecified: Secondary | ICD-10-CM | POA: Insufficient documentation

## 2015-12-18 NOTE — Progress Notes (Signed)
  Echocardiogram 2D Echocardiogram has been performed.  Todd Vincent 12/18/2015, 10:17 AM

## 2015-12-19 ENCOUNTER — Encounter: Payer: Self-pay | Admitting: *Deleted

## 2015-12-20 ENCOUNTER — Other Ambulatory Visit: Payer: Self-pay | Admitting: Family

## 2015-12-26 ENCOUNTER — Other Ambulatory Visit: Payer: Self-pay

## 2015-12-26 DIAGNOSIS — C469 Kaposi's sarcoma, unspecified: Secondary | ICD-10-CM

## 2015-12-27 ENCOUNTER — Other Ambulatory Visit: Payer: Self-pay

## 2015-12-27 ENCOUNTER — Other Ambulatory Visit (HOSPITAL_BASED_OUTPATIENT_CLINIC_OR_DEPARTMENT_OTHER): Payer: Medicare Other

## 2015-12-27 ENCOUNTER — Other Ambulatory Visit: Payer: Self-pay | Admitting: Hematology & Oncology

## 2015-12-27 ENCOUNTER — Ambulatory Visit (HOSPITAL_BASED_OUTPATIENT_CLINIC_OR_DEPARTMENT_OTHER): Payer: Medicare Other

## 2015-12-27 VITALS — BP 110/70 | HR 73 | Temp 98.1°F | Resp 16

## 2015-12-27 DIAGNOSIS — C469 Kaposi's sarcoma, unspecified: Secondary | ICD-10-CM

## 2015-12-27 DIAGNOSIS — Z5111 Encounter for antineoplastic chemotherapy: Secondary | ICD-10-CM

## 2015-12-27 LAB — CBC WITH DIFFERENTIAL (CANCER CENTER ONLY)
BASO#: 0 10*3/uL (ref 0.0–0.2)
BASO%: 0.2 % (ref 0.0–2.0)
EOS%: 2.2 % (ref 0.0–7.0)
Eosinophils Absolute: 0.2 10*3/uL (ref 0.0–0.5)
HEMATOCRIT: 41.2 % (ref 38.7–49.9)
HEMOGLOBIN: 14.4 g/dL (ref 13.0–17.1)
LYMPH#: 2.4 10*3/uL (ref 0.9–3.3)
LYMPH%: 22.1 % (ref 14.0–48.0)
MCH: 33.1 pg (ref 28.0–33.4)
MCHC: 35 g/dL (ref 32.0–35.9)
MCV: 95 fL (ref 82–98)
MONO#: 0.6 10*3/uL (ref 0.1–0.9)
MONO%: 5.3 % (ref 0.0–13.0)
NEUT%: 70.2 % (ref 40.0–80.0)
NEUTROS ABS: 7.8 10*3/uL — AB (ref 1.5–6.5)
Platelets: 243 10*3/uL (ref 145–400)
RBC: 4.35 10*6/uL (ref 4.20–5.70)
RDW: 12.9 % (ref 11.1–15.7)
WBC: 11.1 10*3/uL — AB (ref 4.0–10.0)

## 2015-12-27 LAB — CMP (CANCER CENTER ONLY)
ALBUMIN: 3.6 g/dL (ref 3.3–5.5)
ALK PHOS: 66 U/L (ref 26–84)
ALT: 13 U/L (ref 10–47)
AST: 17 U/L (ref 11–38)
BILIRUBIN TOTAL: 0.7 mg/dL (ref 0.20–1.60)
BUN, Bld: 5 mg/dL — ABNORMAL LOW (ref 7–22)
CALCIUM: 8.8 mg/dL (ref 8.0–10.3)
CO2: 25 mEq/L (ref 18–33)
Chloride: 107 mEq/L (ref 98–108)
Creat: 1.2 mg/dl (ref 0.6–1.2)
GLUCOSE: 95 mg/dL (ref 73–118)
Potassium: 3.6 mEq/L (ref 3.3–4.7)
Sodium: 135 mEq/L (ref 128–145)
Total Protein: 6.6 g/dL (ref 6.4–8.1)

## 2015-12-27 MED ORDER — SODIUM CHLORIDE 0.9 % IJ SOLN
3.0000 mL | INTRAMUSCULAR | Status: DC | PRN
  Filled 2015-12-27: qty 10

## 2015-12-27 MED ORDER — MECLIZINE HCL 25 MG PO TABS: 25.0000 mg | ORAL_TABLET | Freq: Four times a day (QID) | ORAL | 1 refills | Status: DC | PRN

## 2015-12-27 MED ORDER — ALTEPLASE 2 MG IJ SOLR
2.0000 mg | Freq: Once | INTRAMUSCULAR | Status: DC | PRN
  Filled 2015-12-27: qty 2

## 2015-12-27 MED ORDER — HEPARIN SOD (PORK) LOCK FLUSH 100 UNIT/ML IV SOLN
500.0000 [IU] | Freq: Once | INTRAVENOUS | Status: DC | PRN
  Filled 2015-12-27: qty 5

## 2015-12-27 MED ORDER — HEPARIN SOD (PORK) LOCK FLUSH 100 UNIT/ML IV SOLN
250.0000 [IU] | Freq: Once | INTRAVENOUS | Status: DC | PRN
  Filled 2015-12-27: qty 5

## 2015-12-27 MED ORDER — DOXORUBICIN HCL LIPOSOMAL CHEMO INJECTION 2 MG/ML
20.0000 mg/m2 | Freq: Once | INTRAVENOUS | Status: AC
  Administered 2015-12-27: 36 mg via INTRAVENOUS
  Filled 2015-12-27: qty 18

## 2015-12-27 MED ORDER — SODIUM CHLORIDE 0.9 % IJ SOLN
10.0000 mL | INTRAMUSCULAR | Status: DC | PRN
  Filled 2015-12-27: qty 10

## 2015-12-27 MED ORDER — DEXTROSE 5 % IV SOLN: Freq: Once | INTRAVENOUS | Status: DC

## 2015-12-27 MED ORDER — SODIUM CHLORIDE 0.9 % IV SOLN
Freq: Once | INTRAVENOUS | Status: AC
  Administered 2015-12-27: 11:00:00 via INTRAVENOUS
  Filled 2015-12-27: qty 4

## 2015-12-27 MED ORDER — DRONABINOL 5 MG PO CAPS: ORAL_CAPSULE | ORAL | 0 refills | Status: DC

## 2015-12-27 NOTE — Patient Instructions (Signed)
Doxorubicin Liposomal injection  What is this medicine?  LIPOSOMAL DOXORUBICIN (LIP oh som al dox oh ROO bi sin) is a chemotherapy drug. This medicine is used to treat many kinds of cancer like Kaposi's sarcoma, multiple myeloma, and ovarian cancer.  This medicine may be used for other purposes; ask your health care provider or pharmacist if you have questions.  What should I tell my health care provider before I take this medicine?  They need to know if you have any of these conditions:  -blood disorders  -heart disease  -infection (especially a virus infection such as chickenpox, cold sores, or herpes)  -liver disease  -recent or ongoing radiation therapy  -an unusual or allergic reaction to doxorubicin, other chemotherapy agents, soybeans, other medicines, foods, dyes, or preservatives  -pregnant or trying to get pregnant  -breast-feeding  How should I use this medicine?  This drug is given as an infusion into a vein. It is administered in a hospital or clinic by a specially trained health care professional. If you have pain, swelling, burning or any unusual feeling around the site of your injection, tell your health care professional right away.  Talk to your pediatrician regarding the use of this medicine in children. Special care may be needed.  Overdosage: If you think you have taken too much of this medicine contact a poison control center or emergency room at once.  NOTE: This medicine is only for you. Do not share this medicine with others.  What if I miss a dose?  It is important not to miss your dose. Call your doctor or health care professional if you are unable to keep an appointment.  What may interact with this medicine?  Do not take this medicine with any of the following medications:  -zidovudine  This medicine may also interact with the following medications:  -medicines to increase blood counts like filgrastim, pegfilgrastim, sargramostim  -vaccines  Talk to your doctor or health care  professional before taking any of these medicines:  -acetaminophen  -aspirin  -ibuprofen  -ketoprofen  -naproxen  This list may not describe all possible interactions. Give your health care provider a list of all the medicines, herbs, non-prescription drugs, or dietary supplements you use. Also tell them if you smoke, drink alcohol, or use illegal drugs. Some items may interact with your medicine.  What should I watch for while using this medicine?  Your condition will be monitored carefully while you are receiving this medicine. You will need important blood work done while you are taking this medicine.  This drug may make you feel generally unwell. This is not uncommon, as chemotherapy can affect healthy cells as well as cancer cells. Report any side effects. Continue your course of treatment even though you feel ill unless your doctor tells you to stop.  Your urine may turn orange-red for a few days after your dose. This is not blood. If your urine is dark or brown, call your doctor.  In some cases, you may be given additional medicines to help with side effects. Follow all directions for their use.  Call your doctor or health care professional for advice if you get a fever (100.5 degrees F or higher), chills or sore throat, or other symptoms of a cold or flu. Do not treat yourself. This drug decreases your body's ability to fight infections. Try to avoid being around people who are sick.  This medicine may increase your risk to bruise or bleed. Call your doctor or   you are receiving this medicine. Avoid taking products that contain aspirin, acetaminophen, ibuprofen, naproxen, or ketoprofen unless instructed by your doctor. These medicines may hide a fever. Men and women of  childbearing age should use effective birth control methods while using taking this medicine. Do not become pregnant while taking this medicine. There is a potential for serious side effects to an unborn child. Talk to your health care professional or pharmacist for more information. Do not breast-feed an infant while taking this medicine. Talk to your doctor about your risk of cancer. You may be more at risk for certain types of cancers if you take this medicine. What side effects may I notice from receiving this medicine? Side effects that you should report to your doctor or health care professional as soon as possible: -allergic reactions like skin rash, itching or hives, swelling of the face, lips, or tongue -low blood counts - this medicine may decrease the number of white blood cells, red blood cells and platelets. You may be at increased risk for infections and bleeding. -signs of hand-foot syndrome - tingling or burning, redness, flaking, swelling, small blisters, or small sores on the palms of your hands or the soles of your feet -signs of infection - fever or chills, cough, sore throat, pain or difficulty passing urine -signs of decreased platelets or bleeding - bruising, pinpoint red spots on the skin, black, tarry stools, blood in the urine -signs of decreased red blood cells - unusually weak or tired, fainting spells, lightheadedness -back pain, chills, facial flushing, fever, headache, tightness in the chest or throat during the infusion -breathing problems -chest pain -fast, irregular heartbeat -mouth pain, redness, sores -pain, swelling, redness at site where injected -pain, tingling, numbness in the hands or feet -swelling of ankles, feet, or hands -vomiting Side effects that usually do not require medical attention (report to your doctor or health care professional if they continue or are bothersome): -diarrhea -hair loss -loss of appetite -nail discoloration or  damage -nausea -red or watery eyes -red colored urine -stomach upset This list may not describe all possible side effects. Call your doctor for medical advice about side effects. You may report side effects to FDA at 1-800-FDA-1088. Where should I keep my medicine? This drug is given in a hospital or clinic and will not be stored at home. NOTE: This sheet is a summary. It may not cover all possible information. If you have questions about this medicine, talk to your doctor, pharmacist, or health care provider.    2016, [employer]. (2012-01-22 10:12:56)

## 2015-12-31 ENCOUNTER — Ambulatory Visit: Payer: Medicaid Other | Admitting: Internal Medicine

## 2016-01-10 ENCOUNTER — Other Ambulatory Visit: Payer: Medicare Other

## 2016-01-10 ENCOUNTER — Ambulatory Visit: Payer: Medicare Other | Admitting: Hematology & Oncology

## 2016-01-14 ENCOUNTER — Encounter: Payer: Self-pay | Admitting: Internal Medicine

## 2016-01-14 ENCOUNTER — Ambulatory Visit: Payer: Medicare Other | Admitting: *Deleted

## 2016-01-14 ENCOUNTER — Ambulatory Visit (INDEPENDENT_AMBULATORY_CARE_PROVIDER_SITE_OTHER): Payer: Medicare Other | Admitting: Internal Medicine

## 2016-01-14 VITALS — BP 132/82 | HR 86 | Temp 98.3°F | Wt 153.0 lb

## 2016-01-14 DIAGNOSIS — Z21 Asymptomatic human immunodeficiency virus [HIV] infection status: Secondary | ICD-10-CM

## 2016-01-14 DIAGNOSIS — Z23 Encounter for immunization: Secondary | ICD-10-CM

## 2016-01-14 DIAGNOSIS — N289 Disorder of kidney and ureter, unspecified: Secondary | ICD-10-CM

## 2016-01-14 DIAGNOSIS — F32A Depression, unspecified: Secondary | ICD-10-CM

## 2016-01-14 DIAGNOSIS — B20 Human immunodeficiency virus [HIV] disease: Secondary | ICD-10-CM

## 2016-01-14 DIAGNOSIS — F329 Major depressive disorder, single episode, unspecified: Secondary | ICD-10-CM

## 2016-01-14 NOTE — Assessment & Plan Note (Signed)
Stable now. 

## 2016-01-14 NOTE — Assessment & Plan Note (Signed)
Doing well.  Will check labs today and rtc 6 months unless concerns.

## 2016-01-14 NOTE — BH Specialist Note (Signed)
Counselor met with Todd Vincent in the exam room for a warm hand off by staff.  Patient was oriented times four with good affect and dress.  Patient was alert and talkative. Patient shared that he was doing ok and his depression was much better. Counselor listened attentively as patient described his depression symptoms.  Counselor invited patient to the support group and encouraged him to attend.  Patient asked for detailed information about the support group to which counselor provided.  Counselor provided support otherwise and recommended that patient make an appointment with counselor if he ever had the need.    Rolena Infante, MA,LPC [facility]

## 2016-01-14 NOTE — Progress Notes (Signed)
  Subjective:    Patient ID: Todd Vincent, male    DOB: January 06, 1976, 40 y.o.   MRN: [id number]  HPI He comes in for routine follow up of HIV.   He was on Isentress and truvada, prezista, norvir and I changed him to [place] last year.  Some minimal initial effects but no issues now. He has been on doxil for Kaposi's sarcoma and now back on it after a 6 month break.   His CD4 last was 260, viral load suppressed at just 22 copies.  No new issues.  No weight loss, trying to maintain weight.  no diarrhea. No new complaints.     Review of Systems  Constitutional: Negative for chills and fever.  HENT: Negative for sore throat and trouble swallowing.   Gastrointestinal: Negative for diarrhea and nausea.  Skin: Negative for rash.  Neurological: Negative for dizziness.  Psychiatric/Behavioral: Negative for dysphoric mood.       Objective:   Physical Exam  Constitutional: He appears well-developed and well-nourished. No distress.  HENT:  Mouth/Throat: No oropharyngeal exudate.  Eyes: No scleral icterus.  Cardiovascular: Normal rate, regular rhythm and normal heart sounds.   No murmur heard. Pulmonary/Chest: Effort normal and breath sounds normal. No respiratory distress.  Lymphadenopathy:    He has no cervical adenopathy.  Skin: No rash noted.             Assessment & Plan:

## 2016-01-15 LAB — T-HELPER CELL (CD4) - (RCID CLINIC ONLY)
CD4 T CELL ABS: 290 /uL — AB (ref 400–2700)
CD4 T CELL HELPER: 18 % — AB (ref 33–55)

## 2016-01-16 LAB — HIV-1 RNA QUANT-NO REFLEX-BLD
HIV 1 RNA Quant: <20 copies/mL (ref <20)
HIV-1 RNA Quant, Log: <1.3 Log copies/mL (ref <1.30)

## 2016-01-24 ENCOUNTER — Encounter: Payer: Self-pay | Admitting: Family

## 2016-01-24 ENCOUNTER — Ambulatory Visit (HOSPITAL_BASED_OUTPATIENT_CLINIC_OR_DEPARTMENT_OTHER): Payer: Medicare Other

## 2016-01-24 ENCOUNTER — Other Ambulatory Visit (HOSPITAL_BASED_OUTPATIENT_CLINIC_OR_DEPARTMENT_OTHER): Payer: Medicare Other

## 2016-01-24 ENCOUNTER — Ambulatory Visit (HOSPITAL_BASED_OUTPATIENT_CLINIC_OR_DEPARTMENT_OTHER): Payer: Medicare Other | Admitting: Family

## 2016-01-24 VITALS — BP 113/67 | HR 78 | Temp 97.8°F | Resp 18 | Ht 67.0 in | Wt 150.0 lb

## 2016-01-24 DIAGNOSIS — C469 Kaposi's sarcoma, unspecified: Secondary | ICD-10-CM

## 2016-01-24 DIAGNOSIS — D469 Myelodysplastic syndrome, unspecified: Secondary | ICD-10-CM | POA: Diagnosis not present

## 2016-01-24 DIAGNOSIS — Z21 Asymptomatic human immunodeficiency virus [HIV] infection status: Secondary | ICD-10-CM | POA: Diagnosis not present

## 2016-01-24 DIAGNOSIS — Z5111 Encounter for antineoplastic chemotherapy: Secondary | ICD-10-CM

## 2016-01-24 DIAGNOSIS — M25562 Pain in left knee: Secondary | ICD-10-CM

## 2016-01-24 DIAGNOSIS — R64 Cachexia: Secondary | ICD-10-CM

## 2016-01-24 LAB — LACTATE DEHYDROGENASE: LDH: 185 U/L (ref 125–245)

## 2016-01-24 LAB — CBC WITH DIFFERENTIAL (CANCER CENTER ONLY)
BASO#: 0 10*3/uL (ref 0.0–0.2)
BASO%: 0.3 % (ref 0.0–2.0)
EOS%: 3.1 % (ref 0.0–7.0)
Eosinophils Absolute: 0.3 10*3/uL (ref 0.0–0.5)
HCT: 42.5 % (ref 38.7–49.9)
HEMOGLOBIN: 14.8 g/dL (ref 13.0–17.1)
LYMPH#: 2.1 10*3/uL (ref 0.9–3.3)
LYMPH%: 26.4 % (ref 14.0–48.0)
MCH: 32.8 pg (ref 28.0–33.4)
MCHC: 34.8 g/dL (ref 32.0–35.9)
MCV: 94 fL (ref 82–98)
MONO#: 0.7 10*3/uL (ref 0.1–0.9)
MONO%: 8.3 % (ref 0.0–13.0)
NEUT%: 61.9 % (ref 40.0–80.0)
NEUTROS ABS: 4.9 10*3/uL (ref 1.5–6.5)
Platelets: 247 10*3/uL (ref 145–400)
RBC: 4.51 10*6/uL (ref 4.20–5.70)
RDW: 13.3 % (ref 11.1–15.7)
WBC: 8 10*3/uL (ref 4.0–10.0)

## 2016-01-24 LAB — CMP (CANCER CENTER ONLY)
ALBUMIN: 3.6 g/dL (ref 3.3–5.5)
ALT(SGPT): 14 U/L (ref 10–47)
AST: 16 U/L (ref 11–38)
Alkaline Phosphatase: 74 U/L (ref 26–84)
BUN, Bld: 8 mg/dL (ref 7–22)
CHLORIDE: 106 meq/L (ref 98–108)
CO2: 25 meq/L (ref 18–33)
CREATININE: 1 mg/dL (ref 0.6–1.2)
Calcium: 9.1 mg/dL (ref 8.0–10.3)
Glucose, Bld: 114 mg/dL (ref 73–118)
Potassium: 3.4 mEq/L (ref 3.3–4.7)
SODIUM: 136 meq/L (ref 128–145)
Total Bilirubin: 0.8 mg/dl (ref 0.20–1.60)
Total Protein: 6.9 g/dL (ref 6.4–8.1)

## 2016-01-24 MED ORDER — DEXTROSE 5 % IV SOLN
Freq: Once | INTRAVENOUS | Status: AC
  Administered 2016-01-24: 11:00:00 via INTRAVENOUS

## 2016-01-24 MED ORDER — SODIUM CHLORIDE 0.9 % IV SOLN
Freq: Once | INTRAVENOUS | Status: AC
  Administered 2016-01-24: 11:00:00 via INTRAVENOUS
  Filled 2016-01-24: qty 4

## 2016-01-24 MED ORDER — LIDOCAINE 5 % EX PTCH: 1.0000 | MEDICATED_PATCH | CUTANEOUS | 0 refills | Status: DC

## 2016-01-24 MED ORDER — ONDANSETRON HCL 8 MG PO TABS: 8.0000 mg | ORAL_TABLET | Freq: Two times a day (BID) | ORAL | 6 refills | Status: DC

## 2016-01-24 MED ORDER — DOXORUBICIN HCL LIPOSOMAL CHEMO INJECTION 2 MG/ML
20.0000 mg/m2 | Freq: Once | INTRAVENOUS | Status: AC
  Administered 2016-01-24: 36 mg via INTRAVENOUS
  Filled 2016-01-24: qty 18

## 2016-01-24 NOTE — Patient Instructions (Signed)
[facility] Discharge Instructions for Patients Receiving Chemotherapy  Today you received the following chemotherapy agents :  Doxil.  To help prevent nausea and vomiting after your treatment, we encourage you to take your nausea medication as prescribed.   If you develop nausea and vomiting that is not controlled by your nausea medication, call the clinic.   BELOW ARE SYMPTOMS THAT SHOULD BE REPORTED IMMEDIATELY:  *FEVER GREATER THAN 100.5 F  *CHILLS WITH OR WITHOUT FEVER  NAUSEA AND VOMITING THAT IS NOT CONTROLLED WITH YOUR NAUSEA MEDICATION  *UNUSUAL SHORTNESS OF BREATH  *UNUSUAL BRUISING OR BLEEDING  TENDERNESS IN MOUTH AND THROAT WITH OR WITHOUT PRESENCE OF ULCERS  *URINARY PROBLEMS  *BOWEL PROBLEMS  UNUSUAL RASH Items with * indicate a potential emergency and should be followed up as soon as possible.  Feel free to call the clinic you have any questions or concerns. The clinic phone number is [phone].  Please show the CHEMO ALERT CARD at check-in to the Emergency Department and triage nurse.   

## 2016-01-24 NOTE — Progress Notes (Signed)
Hematology and Oncology Follow Up Visit  Todd Vincent [id number] 10/08/1975 39 y.o. 01/24/2016   Principle Diagnosis:  Kaposi's sarcoma - progressive HIV-asymptomatic  Current Therapy:   Doxil- q 4 week dosing    Interim History:  Todd Vincent is here today for a follow-up. He is doing well and just returned from spending 2 weeks with his sister in [place]. He has had no new lesions appear. He still has some hyperpigmentation that has not yet faded. He restarted treatment last month and has had one cycle of Doxil.  ECHO in August showed an EF of 60-65%.  He followed up with ID Dr. Linus Salmons in August. HIV 1 RNA was < 20. He will follow-up with their office again in 6 months.  No fatigue, fever, chills, n/v, cough, headaches, blurred vision, SOB, chest pain, palpitations, abdominal pain or changes in bladder habits.  His temporary ostomy is functioning appropriately. He is still waiting to have his ostomy reversal surgery.  He is taking his diuretics as directed to prevent swelling in his extremities. He has chronic "puffiness" in his ankles and feet and this appears to be improved today. No numbness or tingling in his extremities. He has had some right knee pain and would like to try a Lidocaine patch to the effected area.  He is eating well and staying hydrated. He still supplements with boost. His weight is stable.   Medications:    Medication List       Accurate as of 01/24/16 10:47 AM. Always use your most recent med list.          acyclovir 800 MG tablet Commonly known as:  ZOVIRAX Take 800 mg by mouth 5 (five) times daily.   ascorbic acid 500 MG tablet Commonly known as:  VITAMIN C Take by mouth.   citalopram 10 MG tablet Commonly known as:  CELEXA Take 1 tablet (10 mg total) by mouth daily.   COCONUT OIL PO Take 1,000 mg by mouth 2 (two) times daily.   dapsone 100 MG tablet TAKE 1 TABLET BY MOUTH DAILY   Darunavir Ethanolate 800 MG tablet Commonly known as:   PREZISTA TAKE 1 TABLET BY MOUTH EVERY DAY WITH BREAKFAST   dronabinol 5 MG capsule Commonly known as:  MARINOL TAKE 1 CAPSULE BY MOUTH TWICE DAILY BEFORE LUNCH AND SUPPER   ferrous sulfate 325 (65 FE) MG EC tablet Take by mouth.   gabapentin 300 MG capsule Commonly known as:  NEURONTIN TAKE 1 CAPSULE(300 MG) BY MOUTH THREE TIMES DAILY   GENVOYA 150-150-200-10 MG Tabs tablet Generic drug:  elvitegravir-cobicistat-emtricitabine-tenofovir TAKE 1 TABLET BY MOUTH DAILY.   LORazepam 0.5 MG tablet Commonly known as:  ATIVAN Take 1 tablet (0.5 mg total) by mouth every 6 (six) hours as needed (Nausea or vomiting).   meclizine 25 MG tablet Commonly known as:  ANTIVERT TAKE 1 TABLET(25 MG) BY MOUTH EVERY 6 HOURS AS NEEDED   ondansetron 8 MG tablet Commonly known as:  ZOFRAN Take 1 tablet (8 mg total) by mouth 2 (two) times daily. Start the day after chemo for 2 days. Then take as needed for nausea or vomiting.   traZODone 100 MG tablet Commonly known as:  DESYREL TAKE 1 TABLET(100 MG) BY MOUTH AT BEDTIME   triamterene-hydrochlorothiazide 37.5-25 MG tablet Commonly known as:  MAXZIDE-25 TAKE 1 TABLET BY MOUTH DAILY AS NEEDED FOR LEG SWELLING   Vitamin D3 2000 units Tabs Take 2,000 Units by mouth every morning. 2 tabs = 4,000 U daily  Allergies:  Allergies  Allergen Reactions  . Sulfa Antibiotics Rash    Extreme itching     Past Medical History, Surgical history, Social history, and Family History were reviewed and updated.  Review of Systems: All other 10 point review of systems is negative.   Physical Exam:  height is 5\' 7"  (1.702 m) and weight is 150 lb (68 kg). His oral temperature is 97.8 F (36.6 C). His blood pressure is 113/67 and his pulse is 78. His respiration is 18.   Wt Readings from Last 3 Encounters:  01/24/16 150 lb (68 kg)  01/14/16 153 lb (69.4 kg)  12/13/15 149 lb (67.6 kg)    Ocular: Sclerae unicteric, pupils equal, round and reactive to  light Ear-nose-throat: Oropharynx clear, dentition fair Lymphatic: No cervical supraclavicular or axillary adenopathy Lungs no rales or rhonchi, good excursion bilaterally Heart regular rate and rhythm, no murmur appreciated Abd soft, nontender, positive bowel sounds, no liver or spleen tip palpated on exam, no fluid wave MSK no focal spinal tenderness, no joint edema Neuro: non-focal, well-oriented, appropriate affect  Lab Results  Component Value Date   WBC 8.0 01/24/2016   HGB 14.8 01/24/2016   HCT 42.5 01/24/2016   MCV 94 01/24/2016   PLT 247 01/24/2016   No results found for: FERRITIN, IRON, TIBC, UIBC, IRONPCTSAT Lab Results  Component Value Date   RBC 4.51 01/24/2016   No results found for: KPAFRELGTCHN, LAMBDASER, KAPLAMBRATIO No results found for: IGGSERUM, IGA, IGMSERUM No results found for: Odetta Pink, SPEI   Chemistry      Component Value Date/Time   NA 135 12/27/2015 0916   NA 141 12/13/2015 0827   K 3.6 12/27/2015 0916   K 4.0 12/13/2015 0827   CL 107 12/27/2015 0916   CO2 25 12/27/2015 0916   CO2 26 12/13/2015 0827   BUN 5 (L) 12/27/2015 0916   BUN 8.2 12/13/2015 0827   CREATININE 1.2 12/27/2015 0916   CREATININE 1.2 12/13/2015 0827      Component Value Date/Time   CALCIUM 8.8 12/27/2015 0916   CALCIUM 9.2 12/13/2015 0827   ALKPHOS 66 12/27/2015 0916   ALKPHOS 78 12/13/2015 0827   AST 17 12/27/2015 0916   AST 14 12/13/2015 0827   ALT 13 12/27/2015 0916   ALT 13 12/13/2015 0827   BILITOT 0.70 12/27/2015 0916   BILITOT 0.40 12/13/2015 0827     Impression and Plan: Todd Vincent is 40 yo African American gentleman with Kaposi's sarcoma. He continues to do well and tolerated restarting Doxil nicely last month. He is asymptomatic at this time and has no complaints. He has no new skin lesions.  We will proceed with treatment today as planned. He typically goes to stay will his mom in [place], [place]  after each cycle to rest.  We will plan to see him back for follow-up and treatment in 1 month.  He will contact us with any questions or concerns. We can certainly see him sooner if need be.   Eliezer Bottom, NP 9/8/201710:47 AM

## 2016-01-26 ENCOUNTER — Other Ambulatory Visit: Payer: Self-pay | Admitting: Family

## 2016-01-26 ENCOUNTER — Other Ambulatory Visit: Payer: Self-pay | Admitting: Hematology & Oncology

## 2016-01-26 ENCOUNTER — Other Ambulatory Visit: Payer: Self-pay | Admitting: Internal Medicine

## 2016-01-26 DIAGNOSIS — M25562 Pain in left knee: Secondary | ICD-10-CM

## 2016-01-26 DIAGNOSIS — F329 Major depressive disorder, single episode, unspecified: Secondary | ICD-10-CM

## 2016-01-26 DIAGNOSIS — R64 Cachexia: Secondary | ICD-10-CM

## 2016-01-26 DIAGNOSIS — C469 Kaposi's sarcoma, unspecified: Secondary | ICD-10-CM

## 2016-01-26 DIAGNOSIS — F32A Depression, unspecified: Secondary | ICD-10-CM

## 2016-01-28 ENCOUNTER — Other Ambulatory Visit: Payer: Self-pay | Admitting: Hematology & Oncology

## 2016-01-28 MED ORDER — TRIAMTERENE-HCTZ 37.5-25 MG PO TABS: 1.0000 | ORAL_TABLET | Freq: Every day | ORAL | 3 refills | Status: DC

## 2016-01-28 MED ORDER — DRONABINOL 5 MG PO CAPS: ORAL_CAPSULE | ORAL | 0 refills | Status: DC

## 2016-01-28 MED ORDER — TRAZODONE HCL 100 MG PO TABS: 100.0000 mg | ORAL_TABLET | Freq: Every evening | ORAL | 0 refills | Status: DC | PRN

## 2016-01-28 MED ORDER — GABAPENTIN 300 MG PO CAPS: 300.0000 mg | ORAL_CAPSULE | Freq: Three times a day (TID) | ORAL | 0 refills | Status: DC

## 2016-01-31 ENCOUNTER — Other Ambulatory Visit: Payer: Self-pay | Admitting: Hematology & Oncology

## 2016-02-01 ENCOUNTER — Other Ambulatory Visit: Payer: Self-pay | Admitting: Hematology & Oncology

## 2016-02-03 ENCOUNTER — Other Ambulatory Visit: Payer: Self-pay | Admitting: Nurse Practitioner

## 2016-02-03 MED ORDER — DRONABINOL 5 MG PO CAPS: ORAL_CAPSULE | ORAL | 0 refills | Status: DC

## 2016-02-17 DIAGNOSIS — R112 Nausea with vomiting, unspecified: Secondary | ICD-10-CM | POA: Diagnosis not present

## 2016-02-17 DIAGNOSIS — R42 Dizziness and giddiness: Secondary | ICD-10-CM | POA: Diagnosis not present

## 2016-02-17 DIAGNOSIS — R109 Unspecified abdominal pain: Secondary | ICD-10-CM | POA: Diagnosis not present

## 2016-02-17 DIAGNOSIS — E875 Hyperkalemia: Secondary | ICD-10-CM | POA: Diagnosis not present

## 2016-02-17 DIAGNOSIS — A084 Viral intestinal infection, unspecified: Secondary | ICD-10-CM | POA: Diagnosis not present

## 2016-02-17 DIAGNOSIS — N179 Acute kidney failure, unspecified: Secondary | ICD-10-CM | POA: Diagnosis not present

## 2016-02-17 DIAGNOSIS — C469 Kaposi's sarcoma, unspecified: Secondary | ICD-10-CM | POA: Diagnosis not present

## 2016-02-17 DIAGNOSIS — B20 Human immunodeficiency virus [HIV] disease: Secondary | ICD-10-CM | POA: Diagnosis not present

## 2016-02-17 DIAGNOSIS — E860 Dehydration: Secondary | ICD-10-CM | POA: Diagnosis not present

## 2016-02-18 DIAGNOSIS — R112 Nausea with vomiting, unspecified: Secondary | ICD-10-CM | POA: Diagnosis not present

## 2016-02-18 DIAGNOSIS — A09 Infectious gastroenteritis and colitis, unspecified: Secondary | ICD-10-CM | POA: Diagnosis not present

## 2016-02-18 DIAGNOSIS — B20 Human immunodeficiency virus [HIV] disease: Secondary | ICD-10-CM | POA: Diagnosis not present

## 2016-02-18 DIAGNOSIS — N179 Acute kidney failure, unspecified: Secondary | ICD-10-CM | POA: Diagnosis not present

## 2016-02-18 DIAGNOSIS — E875 Hyperkalemia: Secondary | ICD-10-CM | POA: Diagnosis present

## 2016-02-18 DIAGNOSIS — R109 Unspecified abdominal pain: Secondary | ICD-10-CM | POA: Diagnosis not present

## 2016-02-18 DIAGNOSIS — Z932 Ileostomy status: Secondary | ICD-10-CM | POA: Diagnosis not present

## 2016-02-18 DIAGNOSIS — Z87891 Personal history of nicotine dependence: Secondary | ICD-10-CM | POA: Diagnosis not present

## 2016-02-18 DIAGNOSIS — Z9221 Personal history of antineoplastic chemotherapy: Secondary | ICD-10-CM | POA: Diagnosis not present

## 2016-02-18 DIAGNOSIS — R42 Dizziness and giddiness: Secondary | ICD-10-CM | POA: Diagnosis not present

## 2016-02-18 DIAGNOSIS — A084 Viral intestinal infection, unspecified: Secondary | ICD-10-CM | POA: Diagnosis present

## 2016-02-18 DIAGNOSIS — C469 Kaposi's sarcoma, unspecified: Secondary | ICD-10-CM | POA: Diagnosis present

## 2016-02-18 DIAGNOSIS — R198 Other specified symptoms and signs involving the digestive system and abdomen: Secondary | ICD-10-CM | POA: Diagnosis not present

## 2016-02-18 DIAGNOSIS — R0989 Other specified symptoms and signs involving the circulatory and respiratory systems: Secondary | ICD-10-CM | POA: Diagnosis not present

## 2016-02-18 DIAGNOSIS — Z21 Asymptomatic human immunodeficiency virus [HIV] infection status: Secondary | ICD-10-CM | POA: Diagnosis not present

## 2016-02-18 DIAGNOSIS — E860 Dehydration: Secondary | ICD-10-CM | POA: Diagnosis present

## 2016-02-18 DIAGNOSIS — R197 Diarrhea, unspecified: Secondary | ICD-10-CM | POA: Insufficient documentation

## 2016-02-20 ENCOUNTER — Other Ambulatory Visit: Payer: Self-pay | Admitting: Internal Medicine

## 2016-02-20 DIAGNOSIS — N289 Disorder of kidney and ureter, unspecified: Secondary | ICD-10-CM | POA: Diagnosis not present

## 2016-02-20 DIAGNOSIS — R600 Localized edema: Secondary | ICD-10-CM | POA: Diagnosis not present

## 2016-02-20 DIAGNOSIS — B20 Human immunodeficiency virus [HIV] disease: Secondary | ICD-10-CM

## 2016-02-20 DIAGNOSIS — K529 Noninfective gastroenteritis and colitis, unspecified: Secondary | ICD-10-CM | POA: Diagnosis not present

## 2016-02-20 DIAGNOSIS — E559 Vitamin D deficiency, unspecified: Secondary | ICD-10-CM | POA: Diagnosis not present

## 2016-02-21 ENCOUNTER — Other Ambulatory Visit: Payer: Self-pay | Admitting: *Deleted

## 2016-02-21 DIAGNOSIS — B20 Human immunodeficiency virus [HIV] disease: Secondary | ICD-10-CM

## 2016-02-21 MED ORDER — ELVITEG-COBIC-EMTRICIT-TENOFAF 150-150-200-10 MG PO TABS: 1.0000 | ORAL_TABLET | Freq: Every day | ORAL | 5 refills | Status: DC

## 2016-02-21 MED ORDER — DAPSONE 100 MG PO TABS: 100.0000 mg | ORAL_TABLET | Freq: Every day | ORAL | 5 refills | Status: DC

## 2016-02-26 ENCOUNTER — Ambulatory Visit (HOSPITAL_BASED_OUTPATIENT_CLINIC_OR_DEPARTMENT_OTHER): Payer: Medicare Other

## 2016-02-26 ENCOUNTER — Ambulatory Visit (HOSPITAL_BASED_OUTPATIENT_CLINIC_OR_DEPARTMENT_OTHER): Payer: Medicare Other | Admitting: Family

## 2016-02-26 ENCOUNTER — Other Ambulatory Visit (HOSPITAL_BASED_OUTPATIENT_CLINIC_OR_DEPARTMENT_OTHER): Payer: Medicare Other

## 2016-02-26 ENCOUNTER — Encounter: Payer: Self-pay | Admitting: Hematology & Oncology

## 2016-02-26 VITALS — BP 122/81 | HR 81 | Temp 98.0°F | Resp 16 | Ht 67.0 in | Wt 154.4 lb

## 2016-02-26 DIAGNOSIS — R64 Cachexia: Secondary | ICD-10-CM

## 2016-02-26 DIAGNOSIS — R7989 Other specified abnormal findings of blood chemistry: Secondary | ICD-10-CM

## 2016-02-26 DIAGNOSIS — Z5111 Encounter for antineoplastic chemotherapy: Secondary | ICD-10-CM

## 2016-02-26 DIAGNOSIS — M25562 Pain in left knee: Secondary | ICD-10-CM

## 2016-02-26 DIAGNOSIS — R945 Abnormal results of liver function studies: Secondary | ICD-10-CM

## 2016-02-26 DIAGNOSIS — C469 Kaposi's sarcoma, unspecified: Secondary | ICD-10-CM | POA: Diagnosis not present

## 2016-02-26 LAB — CMP (CANCER CENTER ONLY)
ALK PHOS: 65 U/L (ref 26–84)
ALT: 84 U/L — AB (ref 10–47)
AST: 57 U/L — ABNORMAL HIGH (ref 11–38)
Albumin: 3.5 g/dL (ref 3.3–5.5)
BUN, Bld: 8 mg/dL (ref 7–22)
CALCIUM: 8.9 mg/dL (ref 8.0–10.3)
CHLORIDE: 102 meq/L (ref 98–108)
CO2: 26 mEq/L (ref 18–33)
Creat: 0.9 mg/dl (ref 0.6–1.2)
GLUCOSE: 124 mg/dL — AB (ref 73–118)
POTASSIUM: 3.6 meq/L (ref 3.3–4.7)
Sodium: 131 mEq/L (ref 128–145)
Total Bilirubin: 0.9 mg/dl (ref 0.20–1.60)
Total Protein: 6.5 g/dL (ref 6.4–8.1)

## 2016-02-26 LAB — CBC WITH DIFFERENTIAL (CANCER CENTER ONLY)
BASO#: 0 10*3/uL (ref 0.0–0.2)
BASO%: 0.3 % (ref 0.0–2.0)
EOS%: 3.1 % (ref 0.0–7.0)
Eosinophils Absolute: 0.4 10*3/uL (ref 0.0–0.5)
HEMATOCRIT: 35.7 % — AB (ref 38.7–49.9)
HEMOGLOBIN: 12.4 g/dL — AB (ref 13.0–17.1)
LYMPH#: 3.3 10*3/uL (ref 0.9–3.3)
LYMPH%: 28.4 % (ref 14.0–48.0)
MCH: 33.1 pg (ref 28.0–33.4)
MCHC: 34.7 g/dL (ref 32.0–35.9)
MCV: 95 fL (ref 82–98)
MONO#: 1.3 10*3/uL — ABNORMAL HIGH (ref 0.1–0.9)
MONO%: 10.8 % (ref 0.0–13.0)
NEUT#: 6.7 10*3/uL — ABNORMAL HIGH (ref 1.5–6.5)
NEUT%: 57.4 % (ref 40.0–80.0)
Platelets: 268 10*3/uL (ref 145–400)
RBC: 3.75 10*6/uL — ABNORMAL LOW (ref 4.20–5.70)
RDW: 13.5 % (ref 11.1–15.7)
WBC: 11.6 10*3/uL — ABNORMAL HIGH (ref 4.0–10.0)

## 2016-02-26 MED ORDER — SODIUM CHLORIDE 0.9 % IV SOLN
Freq: Once | INTRAVENOUS | Status: AC
  Administered 2016-02-26: 12:00:00 via INTRAVENOUS
  Filled 2016-02-26: qty 4

## 2016-02-26 MED ORDER — DOXORUBICIN HCL LIPOSOMAL CHEMO INJECTION 2 MG/ML
20.0000 mg/m2 | Freq: Once | INTRAVENOUS | Status: AC
  Administered 2016-02-26: 36 mg via INTRAVENOUS
  Filled 2016-02-26: qty 18

## 2016-02-26 MED ORDER — DEXTROSE 5 % IV SOLN
Freq: Once | INTRAVENOUS | Status: AC
  Administered 2016-02-26: 12:00:00 via INTRAVENOUS

## 2016-02-26 NOTE — Progress Notes (Signed)
Hematology and Oncology Follow Up Visit  DAIVD KITZMANN [id number] Oct 20, 1975 40 y.o. 02/26/2016   Principle Diagnosis:  Kaposi's sarcoma - progressive HIV-asymptomatic  Current Therapy:   Doxil- q 4 week dosing     Interim History:  Mr. Totty is here today for a follow-up. He was in the ED last week with an upper respiratory and kidney infection. He was given fluids for dehydration with n/v/d and also taking diuretic. He was admitted for observation and treated with fluids and antiemetics. He states that he did not require an antibiotic. He was instructed to stop his diuretic for a few days.  His symptoms have resolved and he feels that his energy is improving. His LFT's are elevated today.  His CD4 in August was 290. He follows up with Dr. Linus Salmons again in 4 months.  His chronic "puffiness" in his ankles and feet is unchanged. No numbness, tingling or weakness in his extremities.  No fever, chills, n/v, cough, headaches, dizziness, blurred vision, SOB, chest pain, palpitations, abdominal pain or changes in bladder habits.  The areas of hyperpigmentation on his arms and legs continue to fade nicely. He has done well on Doxil. There are no new lesions at this time.   His temporary ostomy is functioning appropriately. He is still waiting to have his ostomy reversal surgery.  His appetite is back to normal and he is still supplementing with boost daily. His weight is up 4 lbs since his last visit.   Medications:    Medication List       Accurate as of 02/26/16 11:31 AM. Always use your most recent med list.          acyclovir 800 MG tablet Commonly known as:  ZOVIRAX Take 800 mg by mouth 5 (five) times daily.   ascorbic acid 500 MG tablet Commonly known as:  VITAMIN C Take by mouth.   citalopram 10 MG tablet Commonly known as:  CELEXA TAKE 1 TABLET(10 MG) BY MOUTH DAILY   COCONUT OIL PO Take 1,000 mg by mouth 2 (two) times daily.   dapsone 100 MG tablet TAKE 1 TABLET  BY MOUTH DAILY   dapsone 100 MG tablet Take 1 tablet (100 mg total) by mouth daily.   Darunavir Ethanolate 800 MG tablet Commonly known as:  PREZISTA TAKE 1 TABLET BY MOUTH EVERY DAY WITH BREAKFAST   dronabinol 5 MG capsule Commonly known as:  MARINOL TAKE 1 CAPSULE BY MOUTH TWICE DAILY BEFORE LUNCH AND BEFORE SUPPER   elvitegravir-cobicistat-emtricitabine-tenofovir 150-150-200-10 MG Tabs tablet Commonly known as:  GENVOYA Take 1 tablet by mouth daily.   ferrous sulfate 325 (65 FE) MG EC tablet Take by mouth.   gabapentin 300 MG capsule Commonly known as:  NEURONTIN Take 1 capsule (300 mg total) by mouth 3 (three) times daily.   gabapentin 300 MG capsule Commonly known as:  NEURONTIN TAKE 1 CAPSULE(300 MG) BY MOUTH THREE TIMES DAILY   lidocaine 5 % Commonly known as:  LIDODERM APPLY 1 PATCH TO THE SKIN DAILY. REMOVE AND DISCARD PATCH WITHIN 12 HOURS OR AS DIRECTED BY MD   LORazepam 0.5 MG tablet Commonly known as:  ATIVAN TAKE 1 BY MOUTH EVERY 6 HOURS AS NEEDED   meclizine 25 MG tablet Commonly known as:  ANTIVERT TAKE 1 TABLET(25 MG) BY MOUTH EVERY 6 HOURS AS NEEDED   ondansetron 8 MG tablet Commonly known as:  ZOFRAN Take 1 tablet (8 mg total) by mouth 2 (two) times daily. Start the day after chemo  for 2 days. Then take as needed for nausea or vomiting.   traZODone 100 MG tablet Commonly known as:  DESYREL Take 1 tablet (100 mg total) by mouth at bedtime as needed for sleep.   traZODone 100 MG tablet Commonly known as:  DESYREL TAKE 1 TABLET(100 MG) BY MOUTH AT BEDTIME   triamterene-hydrochlorothiazide 37.5-25 MG tablet Commonly known as:  MAXZIDE-25 Take 1 tablet by mouth daily.   triamterene-hydrochlorothiazide 37.5-25 MG tablet Commonly known as:  MAXZIDE-25 TAKE 1 TABLET BY MOUTH DAILY AS NEEDED FOR LEG SWELLING   Vitamin D3 2000 units Tabs Take 2,000 Units by mouth every morning. 2 tabs = 4,000 U daily       Allergies:  Allergies  Allergen  Reactions  . Sulfa Antibiotics Rash    Extreme itching     Past Medical History, Surgical history, Social history, and Family History were reviewed and updated.  Review of Systems: All other 10 point review of systems is negative.   Physical Exam:  height is 5\' 7"  (1.702 m) and weight is 154 lb 6.4 oz (70 kg). His oral temperature is 98 F (36.7 C). His blood pressure is 122/81 and his pulse is 81. His respiration is 16.   Wt Readings from Last 3 Encounters:  02/26/16 154 lb 6.4 oz (70 kg)  01/24/16 150 lb (68 kg)  01/14/16 153 lb (69.4 kg)    Ocular: Sclerae unicteric, pupils equal, round and reactive to light Ear-nose-throat: Oropharynx clear, dentition fair Lymphatic: No cervical supraclavicular or axillary adenopathy Lungs no rales or rhonchi, good excursion bilaterally Heart regular rate and rhythm, no murmur appreciated Abd soft, nontender, positive bowel sounds, no liver or spleen tip palpated on exam, no fluid wave MSK no focal spinal tenderness, no joint edema Neuro: non-focal, well-oriented, appropriate affect  Lab Results  Component Value Date   WBC 11.6 (H) 02/26/2016   HGB 12.4 (L) 02/26/2016   HCT 35.7 (L) 02/26/2016   MCV 95 02/26/2016   PLT 268 02/26/2016   No results found for: FERRITIN, IRON, TIBC, UIBC, IRONPCTSAT Lab Results  Component Value Date   RBC 3.75 (L) 02/26/2016   No results found for: KPAFRELGTCHN, LAMBDASER, KAPLAMBRATIO No results found for: IGGSERUM, IGA, IGMSERUM No results found for: Odetta Pink, SPEI   Chemistry      Component Value Date/Time   NA 131 02/26/2016 0955   NA 141 12/13/2015 0827   K 3.6 02/26/2016 0955   K 4.0 12/13/2015 0827   CL 102 02/26/2016 0955   CO2 26 02/26/2016 0955   CO2 26 12/13/2015 0827   BUN 8 02/26/2016 0955   BUN 8.2 12/13/2015 0827   CREATININE 0.9 02/26/2016 0955   CREATININE 1.2 12/13/2015 0827      Component Value Date/Time   CALCIUM  8.9 02/26/2016 0955   CALCIUM 9.2 12/13/2015 0827   ALKPHOS 65 02/26/2016 0955   ALKPHOS 78 12/13/2015 0827   AST 57 (H) 02/26/2016 0955   AST 14 12/13/2015 0827   ALT 84 (H) 02/26/2016 0955   ALT 13 12/13/2015 0827   BILITOT 0.90 02/26/2016 0955   BILITOT 0.40 12/13/2015 0827     Impression and Plan: Mr. Lundie is 40 yo African American gentleman with Kaposi's sarcoma. He has responded nicely to Doxil and his lesions continue to fade. He had an upper respiratory and kidney infection last week with dehydration. His symptoms resolved with fluids and antiemetics. He is now home and doing well.  CBC looks good. His WBC count is mildly elevated at 11.6. He is asymptomatic at this time. No anemia.  We will proceed with his Doxil treatment today per Dr. Marin Olp.  His LFT's are elevated today so we will get an abdominal US tomorrow to evaluate the liver since he has already eaten today.  We will plan to see him back for follow-up and treatment in 1 month.  He will contact us with any questions or concerns. We can certainly see him sooner if need be.   Eliezer Bottom, NP 10/11/201711:31 AM

## 2016-02-26 NOTE — Progress Notes (Signed)
Ok to treat per Dr Marin Olp with ALT 84

## 2016-02-26 NOTE — Patient Instructions (Signed)
[facility] Discharge Instructions for Patients Receiving Chemotherapy  Today you received the following chemotherapy agents:Doxil.  To help prevent nausea and vomiting after your treatment, we encourage you to take your nausea medication. If you develop nausea and vomiting that is not controlled by your nausea medication, call the clinic.   BELOW ARE SYMPTOMS THAT SHOULD BE REPORTED IMMEDIATELY:  *FEVER GREATER THAN 100.5 F  *CHILLS WITH OR WITHOUT FEVER  NAUSEA AND VOMITING THAT IS NOT CONTROLLED WITH YOUR NAUSEA MEDICATION  *UNUSUAL SHORTNESS OF BREATH  *UNUSUAL BRUISING OR BLEEDING  TENDERNESS IN MOUTH AND THROAT WITH OR WITHOUT PRESENCE OF ULCERS  *URINARY PROBLEMS  *BOWEL PROBLEMS  UNUSUAL RASH Items with * indicate a potential emergency and should be followed up as soon as possible.  Feel free to call the clinic you have any questions or concerns. The clinic phone number is [phone].  Please show the CHEMO ALERT CARD at check-in to the Emergency Department and triage nurse.   

## 2016-02-27 ENCOUNTER — Ambulatory Visit (HOSPITAL_BASED_OUTPATIENT_CLINIC_OR_DEPARTMENT_OTHER)
Admission: RE | Admit: 2016-02-27 | Discharge: 2016-02-27 | Disposition: A | Discharge status: Home / Self Care | Payer: Medicare Other | Source: Ambulatory Visit | Attending: Family | Admitting: Family

## 2016-02-27 ENCOUNTER — Other Ambulatory Visit: Payer: Self-pay | Admitting: Family

## 2016-02-27 ENCOUNTER — Telehealth: Payer: Self-pay | Admitting: Family

## 2016-02-27 DIAGNOSIS — R7989 Other specified abnormal findings of blood chemistry: Secondary | ICD-10-CM | POA: Diagnosis not present

## 2016-02-27 DIAGNOSIS — K769 Liver disease, unspecified: Secondary | ICD-10-CM | POA: Insufficient documentation

## 2016-02-27 DIAGNOSIS — K760 Fatty (change of) liver, not elsewhere classified: Secondary | ICD-10-CM | POA: Diagnosis not present

## 2016-02-27 DIAGNOSIS — R945 Abnormal results of liver function studies: Secondary | ICD-10-CM

## 2016-02-27 DIAGNOSIS — C469 Kaposi's sarcoma, unspecified: Secondary | ICD-10-CM | POA: Diagnosis not present

## 2016-02-27 LAB — LACTATE DEHYDROGENASE: LDH: 255 U/L — AB (ref 125–245)

## 2016-02-27 NOTE — Telephone Encounter (Signed)
I spoke with Todd Vincent and gave him his [place] results from this morning. He had a new 1.1 cm lesion on the right lobe of his liver. MRI was recommended so this has been ordered and POF sent. He will get this schedule today. All questions were answered and he is in agreement with the plan.

## 2016-02-28 DIAGNOSIS — K6282 Dysplasia of anus: Secondary | ICD-10-CM | POA: Diagnosis not present

## 2016-03-12 ENCOUNTER — Ambulatory Visit
Admission: RE | Admit: 2016-03-12 | Discharge: 2016-03-12 | Disposition: A | Discharge status: Home / Self Care | Payer: Medicare Other | Source: Ambulatory Visit | Attending: Family | Admitting: Family

## 2016-03-12 DIAGNOSIS — C469 Kaposi's sarcoma, unspecified: Secondary | ICD-10-CM

## 2016-03-12 DIAGNOSIS — K7689 Other specified diseases of liver: Secondary | ICD-10-CM | POA: Diagnosis not present

## 2016-03-12 DIAGNOSIS — K769 Liver disease, unspecified: Secondary | ICD-10-CM

## 2016-03-12 MED ORDER — GADOBENATE DIMEGLUMINE 529 MG/ML IV SOLN
14.0000 mL | Freq: Once | INTRAVENOUS | Status: AC | PRN
  Administered 2016-03-12: 14 mL via INTRAVENOUS

## 2016-03-17 DIAGNOSIS — J069 Acute upper respiratory infection, unspecified: Secondary | ICD-10-CM | POA: Diagnosis not present

## 2016-03-18 ENCOUNTER — Telehealth: Payer: Self-pay | Admitting: Family

## 2016-03-18 NOTE — Telephone Encounter (Signed)
Spoke with patient and went over his recent MRI results. 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 All questions were answered and we will plan to follow along with this repeating a scan in 6 months or so. He is in agreement with the plan.

## 2016-03-23 ENCOUNTER — Encounter: Payer: Self-pay | Admitting: Pharmacist

## 2016-03-30 ENCOUNTER — Ambulatory Visit (HOSPITAL_BASED_OUTPATIENT_CLINIC_OR_DEPARTMENT_OTHER): Payer: Medicare Other

## 2016-03-30 ENCOUNTER — Ambulatory Visit (HOSPITAL_BASED_OUTPATIENT_CLINIC_OR_DEPARTMENT_OTHER): Payer: Medicare Other | Admitting: Hematology & Oncology

## 2016-03-30 ENCOUNTER — Encounter: Payer: Self-pay | Admitting: Hematology & Oncology

## 2016-03-30 ENCOUNTER — Other Ambulatory Visit (HOSPITAL_BASED_OUTPATIENT_CLINIC_OR_DEPARTMENT_OTHER): Payer: Medicare Other

## 2016-03-30 VITALS — BP 121/80 | HR 93 | Temp 98.1°F | Wt 156.4 lb

## 2016-03-30 DIAGNOSIS — Z5111 Encounter for antineoplastic chemotherapy: Secondary | ICD-10-CM

## 2016-03-30 DIAGNOSIS — C469 Kaposi's sarcoma, unspecified: Secondary | ICD-10-CM

## 2016-03-30 DIAGNOSIS — R945 Abnormal results of liver function studies: Secondary | ICD-10-CM

## 2016-03-30 DIAGNOSIS — Z21 Asymptomatic human immunodeficiency virus [HIV] infection status: Secondary | ICD-10-CM

## 2016-03-30 DIAGNOSIS — R7989 Other specified abnormal findings of blood chemistry: Secondary | ICD-10-CM

## 2016-03-30 LAB — CMP (CANCER CENTER ONLY)
ALT(SGPT): 32 U/L (ref 10–47)
AST: 21 U/L (ref 11–38)
Albumin: 3.3 g/dL (ref 3.3–5.5)
Alkaline Phosphatase: 69 U/L (ref 26–84)
BILIRUBIN TOTAL: 0.8 mg/dL (ref 0.20–1.60)
BUN: 5 mg/dL — AB (ref 7–22)
CALCIUM: 9 mg/dL (ref 8.0–10.3)
CO2: 28 meq/L (ref 18–33)
Chloride: 103 mEq/L (ref 98–108)
Creat: 0.8 mg/dl (ref 0.6–1.2)
GLUCOSE: 128 mg/dL — AB (ref 73–118)
POTASSIUM: 3.5 meq/L (ref 3.3–4.7)
Sodium: 138 mEq/L (ref 128–145)
Total Protein: 6.3 g/dL — ABNORMAL LOW (ref 6.4–8.1)

## 2016-03-30 LAB — LACTATE DEHYDROGENASE: LDH: 238 U/L (ref 125–245)

## 2016-03-30 LAB — CBC WITH DIFFERENTIAL (CANCER CENTER ONLY)
BASO#: 0 10*3/uL (ref 0.0–0.2)
BASO%: 0.2 % (ref 0.0–2.0)
EOS%: 1.6 % (ref 0.0–7.0)
Eosinophils Absolute: 0.2 10*3/uL (ref 0.0–0.5)
HEMATOCRIT: 34.9 % — AB (ref 38.7–49.9)
HGB: 12.1 g/dL — ABNORMAL LOW (ref 13.0–17.1)
LYMPH#: 2.9 10*3/uL (ref 0.9–3.3)
LYMPH%: 23.7 % (ref 14.0–48.0)
MCH: 33.7 pg — ABNORMAL HIGH (ref 28.0–33.4)
MCHC: 34.7 g/dL (ref 32.0–35.9)
MCV: 97 fL (ref 82–98)
MONO#: 1.1 10*3/uL — ABNORMAL HIGH (ref 0.1–0.9)
MONO%: 8.7 % (ref 0.0–13.0)
NEUT#: 8 10*3/uL — ABNORMAL HIGH (ref 1.5–6.5)
NEUT%: 65.8 % (ref 40.0–80.0)
PLATELETS: 304 10*3/uL (ref 145–400)
RBC: 3.59 10*6/uL — ABNORMAL LOW (ref 4.20–5.70)
RDW: 13.7 % (ref 11.1–15.7)
WBC: 12.2 10*3/uL — ABNORMAL HIGH (ref 4.0–10.0)

## 2016-03-30 MED ORDER — DOXORUBICIN HCL LIPOSOMAL CHEMO INJECTION 2 MG/ML
20.0000 mg/m2 | Freq: Once | INTRAVENOUS | Status: AC
  Administered 2016-03-30: 36 mg via INTRAVENOUS
  Filled 2016-03-30: qty 18

## 2016-03-30 MED ORDER — DEXAMETHASONE SODIUM PHOSPHATE 10 MG/ML IJ SOLN
10.0000 mg | Freq: Once | INTRAMUSCULAR | Status: AC
  Administered 2016-03-30: 10 mg via INTRAVENOUS

## 2016-03-30 MED ORDER — DEXTROSE 5 % IV SOLN
Freq: Once | INTRAVENOUS | Status: AC
  Administered 2016-03-30: 09:00:00 via INTRAVENOUS

## 2016-03-30 MED ORDER — HEPARIN SOD (PORK) LOCK FLUSH 100 UNIT/ML IV SOLN
500.0000 [IU] | Freq: Once | INTRAVENOUS | Status: DC | PRN
  Filled 2016-03-30: qty 5

## 2016-03-30 MED ORDER — SODIUM CHLORIDE 0.9 % IJ SOLN
10.0000 mL | INTRAMUSCULAR | Status: DC | PRN
  Filled 2016-03-30: qty 10

## 2016-03-30 MED ORDER — DEXAMETHASONE SODIUM PHOSPHATE 10 MG/ML IJ SOLN
INTRAMUSCULAR | Status: AC
  Filled 2016-03-30: qty 1

## 2016-03-30 MED ORDER — ONDANSETRON HCL 8 MG PO TABS
ORAL_TABLET | ORAL | Status: AC
  Filled 2016-03-30: qty 1

## 2016-03-30 MED ORDER — ONDANSETRON HCL 8 MG PO TABS
8.0000 mg | ORAL_TABLET | Freq: Once | ORAL | Status: AC
  Administered 2016-03-30: 8 mg via ORAL

## 2016-03-30 NOTE — Progress Notes (Signed)
Hematology and Oncology Follow Up Visit  Todd Vincent [id number] June 02, 1975 40 y.o. 03/30/2016   Principle Diagnosis:  Kaposi's sarcoma - progressive HIV-asymptomatic  Current Therapy:   Doxil- q 4 week dosing     Interim History:  Todd Vincent is here today for a follow-up.he is feeling well. He had a good weekend.  He's had no problems with cough or shortness of breath. His appetite is good. His weight is holding steady.  He has the colostomy. This is reversible. His surgeon I think move down to [place]. He will go down to [place] to see him and see if this or when this can be reversed.  He has not noted any new Kaposi's lesions. He has a chronic leg swelling. This is not changed.   He's had no fever. He's had no bleeding. He's had no bladder issues. His colostomy is functioning well.   Overall, his performance status is ECOG 0.   His HIV is under very good control. He sees the infectious disease clinic in [place].   Medications:    Medication List       Accurate as of 03/30/16  8:32 AM. Always use your most recent med list.          acyclovir 800 MG tablet Commonly known as:  ZOVIRAX Take 800 mg by mouth 5 (five) times daily.   ascorbic acid 500 MG tablet Commonly known as:  VITAMIN C Take by mouth.   citalopram 10 MG tablet Commonly known as:  CELEXA TAKE 1 TABLET(10 MG) BY MOUTH DAILY   COCONUT OIL PO Take 1,000 mg by mouth 2 (two) times daily.   dapsone 100 MG tablet Take 1 tablet (100 mg total) by mouth daily.   Darunavir Ethanolate 800 MG tablet Commonly known as:  PREZISTA TAKE 1 TABLET BY MOUTH EVERY DAY WITH BREAKFAST   dronabinol 5 MG capsule Commonly known as:  MARINOL TAKE 1 CAPSULE BY MOUTH TWICE DAILY BEFORE LUNCH AND BEFORE SUPPER   elvitegravir-cobicistat-emtricitabine-tenofovir 150-150-200-10 MG Tabs tablet Commonly known as:  GENVOYA Take 1 tablet by mouth daily.   ferrous sulfate 325 (65 FE) MG EC tablet Take by  mouth.   gabapentin 300 MG capsule Commonly known as:  NEURONTIN TAKE 1 CAPSULE(300 MG) BY MOUTH THREE TIMES DAILY   lidocaine 5 % Commonly known as:  LIDODERM APPLY 1 PATCH TO THE SKIN DAILY. REMOVE AND DISCARD PATCH WITHIN 12 HOURS OR AS DIRECTED BY MD   LORazepam 0.5 MG tablet Commonly known as:  ATIVAN TAKE 1 BY MOUTH EVERY 6 HOURS AS NEEDED   meclizine 25 MG tablet Commonly known as:  ANTIVERT TAKE 1 TABLET(25 MG) BY MOUTH EVERY 6 HOURS AS NEEDED   ondansetron 8 MG tablet Commonly known as:  ZOFRAN Take 1 tablet (8 mg total) by mouth 2 (two) times daily. Start the day after chemo for 2 days. Then take as needed for nausea or vomiting.   traZODone 100 MG tablet Commonly known as:  DESYREL TAKE 1 TABLET(100 MG) BY MOUTH AT BEDTIME   triamterene-hydrochlorothiazide 37.5-25 MG tablet Commonly known as:  MAXZIDE-25 Take 1 tablet by mouth daily.   Vitamin D3 2000 units Tabs Take 2,000 Units by mouth every morning. 2 tabs = 4,000 U daily       Allergies:  Allergies  Allergen Reactions  . Sulfa Antibiotics Rash    Extreme itching     Past Medical History, Surgical history, Social history, and Family History were reviewed and updated.  Review  of Systems: All other 10 point review of systems is negative.   Physical Exam:  weight is 156 lb 6.4 oz (70.9 kg). His oral temperature is 98.1 F (36.7 C). His blood pressure is 121/80 and his pulse is 93.   Wt Readings from Last 3 Encounters:  03/30/16 156 lb 6.4 oz (70.9 kg)  02/26/16 154 lb 6.4 oz (70 kg)  01/24/16 150 lb (68 kg)    Ocular: Sclerae unicteric, pupils equal, round and reactive to light Ear-nose-throat: Oropharynx clear, dentition fair Lymphatic: No cervical supraclavicular or axillary adenopathy Lungs no rales or rhonchi, good excursion bilaterally Heart regular rate and rhythm, no murmur appreciated Abd soft, nontender, positive bowel sounds, no liver or spleen tip palpated on exam, no fluid  wave MSK no focal spinal tenderness, no joint edema Neuro: non-focal, well-oriented, appropriate affect  Lab Results  Component Value Date   WBC 12.2 (H) 03/30/2016   HGB 12.1 (L) 03/30/2016   HCT 34.9 (L) 03/30/2016   MCV 97 03/30/2016   PLT 304 03/30/2016   No results found for: FERRITIN, IRON, TIBC, UIBC, IRONPCTSAT Lab Results  Component Value Date   RBC 3.59 (L) 03/30/2016   No results found for: KPAFRELGTCHN, LAMBDASER, KAPLAMBRATIO No results found for: IGGSERUM, IGA, IGMSERUM No results found for: Odetta Pink, SPEI   Chemistry      Component Value Date/Time   NA 138 03/30/2016 0757   NA 141 12/13/2015 0827   K 3.5 03/30/2016 0757   K 4.0 12/13/2015 0827   CL 103 03/30/2016 0757   CO2 28 03/30/2016 0757   CO2 26 12/13/2015 0827   BUN 5 (L) 03/30/2016 0757   BUN 8.2 12/13/2015 0827   CREATININE 0.8 03/30/2016 0757   CREATININE 1.2 12/13/2015 0827      Component Value Date/Time   CALCIUM 9.0 03/30/2016 0757   CALCIUM 9.2 12/13/2015 0827   ALKPHOS 69 03/30/2016 0757   ALKPHOS 78 12/13/2015 0827   AST 21 03/30/2016 0757   AST 14 12/13/2015 0827   ALT 32 03/30/2016 0757   ALT 13 12/13/2015 0827   BILITOT 0.80 03/30/2016 0757   BILITOT 0.40 12/13/2015 0827     Impression and Plan: Todd Vincent is 40 yo African American gentleman with Kaposi's sarcoma. He has responded nicely to Doxil and his lesions continue to fade.   From my point, he is doing quite well. I think that as long as his HIV result to control, that he will not have any problems with a Kaposi's.  We will continue his monthly treatment. This is almost like a maintenance treatment.  At some point, we may want to get himself was some skids so we can see how is doing.  We will plan to get him back in one month.  I know that he is looking forward to Thanksgiving. He will be home for thanks giving.     [name], MD 11/13/20178:32  AM

## 2016-03-30 NOTE — Patient Instructions (Signed)
[facility] Discharge Instructions for Patients Receiving Chemotherapy  Today you received the following chemotherapy agents:Doxil.  To help prevent nausea and vomiting after your treatment, we encourage you to take your nausea medication. If you develop nausea and vomiting that is not controlled by your nausea medication, call the clinic.   BELOW ARE SYMPTOMS THAT SHOULD BE REPORTED IMMEDIATELY:  *FEVER GREATER THAN 100.5 F  *CHILLS WITH OR WITHOUT FEVER  NAUSEA AND VOMITING THAT IS NOT CONTROLLED WITH YOUR NAUSEA MEDICATION  *UNUSUAL SHORTNESS OF BREATH  *UNUSUAL BRUISING OR BLEEDING  TENDERNESS IN MOUTH AND THROAT WITH OR WITHOUT PRESENCE OF ULCERS  *URINARY PROBLEMS  *BOWEL PROBLEMS  UNUSUAL RASH Items with * indicate a potential emergency and should be followed up as soon as possible.  Feel free to call the clinic you have any questions or concerns. The clinic phone number is [phone].  Please show the CHEMO ALERT CARD at check-in to the Emergency Department and triage nurse.   

## 2016-04-03 DIAGNOSIS — B20 Human immunodeficiency virus [HIV] disease: Secondary | ICD-10-CM | POA: Diagnosis not present

## 2016-04-03 DIAGNOSIS — B029 Zoster without complications: Secondary | ICD-10-CM | POA: Diagnosis not present

## 2016-04-03 DIAGNOSIS — C469 Kaposi's sarcoma, unspecified: Secondary | ICD-10-CM | POA: Diagnosis not present

## 2016-04-11 ENCOUNTER — Other Ambulatory Visit: Payer: Self-pay | Admitting: Hematology & Oncology

## 2016-04-30 ENCOUNTER — Ambulatory Visit: Payer: Medicare Other | Admitting: Hematology & Oncology

## 2016-04-30 ENCOUNTER — Other Ambulatory Visit: Payer: Medicare Other

## 2016-04-30 ENCOUNTER — Ambulatory Visit: Payer: Medicare Other

## 2016-05-08 ENCOUNTER — Other Ambulatory Visit (HOSPITAL_BASED_OUTPATIENT_CLINIC_OR_DEPARTMENT_OTHER): Payer: Medicare Other

## 2016-05-08 ENCOUNTER — Ambulatory Visit (HOSPITAL_BASED_OUTPATIENT_CLINIC_OR_DEPARTMENT_OTHER): Payer: Medicare Other

## 2016-05-08 ENCOUNTER — Ambulatory Visit (HOSPITAL_BASED_OUTPATIENT_CLINIC_OR_DEPARTMENT_OTHER): Payer: Medicare Other | Admitting: Hematology & Oncology

## 2016-05-08 VITALS — BP 119/77 | HR 84 | Temp 98.2°F | Resp 18 | Wt 154.0 lb

## 2016-05-08 DIAGNOSIS — Z5111 Encounter for antineoplastic chemotherapy: Secondary | ICD-10-CM

## 2016-05-08 DIAGNOSIS — Z21 Asymptomatic human immunodeficiency virus [HIV] infection status: Secondary | ICD-10-CM | POA: Diagnosis not present

## 2016-05-08 DIAGNOSIS — C469 Kaposi's sarcoma, unspecified: Secondary | ICD-10-CM

## 2016-05-08 LAB — CBC WITH DIFFERENTIAL (CANCER CENTER ONLY)
BASO#: 0 10*3/uL (ref 0.0–0.2)
BASO%: 0.2 % (ref 0.0–2.0)
EOS%: 1.8 % (ref 0.0–7.0)
Eosinophils Absolute: 0.2 10*3/uL (ref 0.0–0.5)
HEMATOCRIT: 40.3 % (ref 38.7–49.9)
HEMOGLOBIN: 13.9 g/dL (ref 13.0–17.1)
LYMPH#: 2.2 10*3/uL (ref 0.9–3.3)
LYMPH%: 21.8 % (ref 14.0–48.0)
MCH: 33.2 pg (ref 28.0–33.4)
MCHC: 34.5 g/dL (ref 32.0–35.9)
MCV: 96 fL (ref 82–98)
MONO#: 0.8 10*3/uL (ref 0.1–0.9)
MONO%: 8 % (ref 0.0–13.0)
NEUT%: 68.2 % (ref 40.0–80.0)
NEUTROS ABS: 6.9 10*3/uL — AB (ref 1.5–6.5)
Platelets: 262 10*3/uL (ref 145–400)
RBC: 4.19 10*6/uL — ABNORMAL LOW (ref 4.20–5.70)
RDW: 13.6 % (ref 11.1–15.7)
WBC: 10.1 10*3/uL — ABNORMAL HIGH (ref 4.0–10.0)

## 2016-05-08 LAB — CMP (CANCER CENTER ONLY)
ALBUMIN: 3.9 g/dL (ref 3.3–5.5)
ALK PHOS: 68 U/L (ref 26–84)
ALT: 17 U/L (ref 10–47)
AST: 23 U/L (ref 11–38)
BILIRUBIN TOTAL: 0.8 mg/dL (ref 0.20–1.60)
BUN, Bld: 6 mg/dL — ABNORMAL LOW (ref 7–22)
CALCIUM: 9.4 mg/dL (ref 8.0–10.3)
CO2: 28 mEq/L (ref 18–33)
Chloride: 108 mEq/L (ref 98–108)
Creat: 1.1 mg/dl (ref 0.6–1.2)
Glucose, Bld: 101 mg/dL (ref 73–118)
POTASSIUM: 3.7 meq/L (ref 3.3–4.7)
Sodium: 141 mEq/L (ref 128–145)
Total Protein: 6.9 g/dL (ref 6.4–8.1)

## 2016-05-08 LAB — LACTATE DEHYDROGENASE: LDH: 214 U/L (ref 125–245)

## 2016-05-08 MED ORDER — DOXORUBICIN HCL LIPOSOMAL CHEMO INJECTION 2 MG/ML
20.0000 mg/m2 | Freq: Once | INTRAVENOUS | Status: AC
  Administered 2016-05-08: 36 mg via INTRAVENOUS
  Filled 2016-05-08: qty 18

## 2016-05-08 MED ORDER — DEXAMETHASONE SODIUM PHOSPHATE 10 MG/ML IJ SOLN
INTRAMUSCULAR | Status: AC
  Filled 2016-05-08: qty 1

## 2016-05-08 MED ORDER — ONDANSETRON HCL 8 MG PO TABS: 8.0000 mg | ORAL_TABLET | Freq: Once | ORAL | Status: DC

## 2016-05-08 MED ORDER — DEXTROSE 5 % IV SOLN
Freq: Once | INTRAVENOUS | Status: AC
  Administered 2016-05-08: 11:00:00 via INTRAVENOUS

## 2016-05-08 MED ORDER — DEXAMETHASONE SODIUM PHOSPHATE 10 MG/ML IJ SOLN
10.0000 mg | Freq: Once | INTRAMUSCULAR | Status: AC
  Administered 2016-05-08: 10 mg via INTRAVENOUS

## 2016-05-08 NOTE — Progress Notes (Signed)
Hematology and Oncology Follow Up Visit  EDENILSON HOLLENKAMP [id number] 1976/03/23 40 y.o. 05/08/2016   Principle Diagnosis:  Kaposi's sarcoma - progressive HIV-asymptomatic  Current Therapy:   Doxil- q 6 week dosing     Interim History:  Mr. Fekete is here today for a follow-up.he is feeling well. He had a good thanks giving. He is still recovering from Thanksgiving.   He is looking forward to a quiet Christmas. He is not going anywhere for Christmas.   His last echocardiogram was done back in August. He had a very good ejection fraction of 60-65%. As such, we probably need to repeat an echocardiogram in another month or so.   His skin has been doing quite well. He's tolerated chemotherapy quite nicely.   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 He is not sure when this will be reversed. He says that his surgeon that he had seen moved away. He needs to find another Psychologist, sport and exercise. This is down a [place].   There's been no nausea or vomiting. He's had no problems with urinating.   He has noted some slight swelling in his legs. He is on diuretics which is not taken for a couple weeks.   Overall, I would say that his performance status is ECOG 0.   Medications:  Allergies as of 05/08/2016      Reactions   Sulfa Antibiotics Rash   Extreme itching       Medication List       Accurate as of 05/08/16 10:12 AM. Always use your most recent med list.          acyclovir 800 MG tablet Commonly known as:  ZOVIRAX Take 800 mg by mouth 5 (five) times daily.   ascorbic acid 500 MG tablet Commonly known as:  VITAMIN C Take by mouth.   citalopram 10 MG tablet Commonly known as:  CELEXA TAKE 1 TABLET(10 MG) BY MOUTH DAILY   COCONUT OIL PO Take 1,000 mg by mouth 2 (two) times daily.   dapsone 100 MG tablet Take 1 tablet (100 mg total) by mouth daily.   Darunavir Ethanolate 800 MG tablet Commonly known as:  PREZISTA TAKE 1 TABLET BY MOUTH EVERY DAY WITH BREAKFAST   dronabinol 5 MG  capsule Commonly known as:  MARINOL TAKE 1 CAPSULE BY MOUTH TWICE DAILY BEFORE LUNCH AND BEFORE SUPPER   elvitegravir-cobicistat-emtricitabine-tenofovir 150-150-200-10 MG Tabs tablet Commonly known as:  GENVOYA Take 1 tablet by mouth daily.   ferrous sulfate 325 (65 FE) MG EC tablet Take by mouth.   gabapentin 300 MG capsule Commonly known as:  NEURONTIN TAKE 1 CAPSULE(300 MG) BY MOUTH THREE TIMES DAILY   lidocaine 5 % Commonly known as:  LIDODERM APPLY 1 PATCH TO THE SKIN DAILY. REMOVE AND DISCARD PATCH WITHIN 12 HOURS OR AS DIRECTED BY MD   LORazepam 0.5 MG tablet Commonly known as:  ATIVAN TAKE 1 BY MOUTH EVERY 6 HOURS AS NEEDED   meclizine 25 MG tablet Commonly known as:  ANTIVERT TAKE 1 TABLET(25 MG) BY MOUTH EVERY 6 HOURS AS NEEDED   ondansetron 8 MG tablet Commonly known as:  ZOFRAN Take 1 tablet (8 mg total) by mouth 2 (two) times daily. Start the day after chemo for 2 days. Then take as needed for nausea or vomiting.   traZODone 100 MG tablet Commonly known as:  DESYREL TAKE 1 TABLET(100 MG) BY MOUTH AT BEDTIME   traZODone 100 MG tablet Commonly known as:  DESYREL TAKE 1 TABLET(100 MG) BY MOUTH AT  BEDTIME   triamterene-hydrochlorothiazide 37.5-25 MG tablet Commonly known as:  MAXZIDE-25 Take 1 tablet by mouth daily.   Vitamin D3 2000 units Tabs Take 2,000 Units by mouth every morning. 2 tabs = 4,000 U daily       Allergies:  Allergies  Allergen Reactions  . Sulfa Antibiotics Rash    Extreme itching     Past Medical History, Surgical history, Social history, and Family History were reviewed and updated.  Review of Systems: All other 10 point review of systems is negative.   Physical Exam:  weight is 154 lb (69.9 kg). His oral temperature is 98.2 F (36.8 C). His blood pressure is 119/77 and his pulse is 84. His respiration is 18.   Wt Readings from Last 3 Encounters:  05/08/16 154 lb (69.9 kg)  03/30/16 156 lb 6.4 oz (70.9 kg)  02/26/16 154  lb 6.4 oz (70 kg)    Well-developed and well-nourished African-American male. Head and neck exam shows no ocular or oral lesions. He has no scleral icterus. There is no adenopathy in the neck. Lungs are clear bilaterally. Cardiac exam regular rate and rhythm with no murmurs, rubs or bruits. Abdomen is soft. Has a colostomy in the right lower quadrant. There is no fluid wave. He has no palpable liver or spleen tip. Externally shows 2+ brawny edema in his lower legs. He has good range of motion of his joints and skin exam shows faint macular areas consistent with the Kaposi's. Neurological exam shows no focal neurological deficits.   Lab Results  Component Value Date   WBC 10.1 (H) 05/08/2016   HGB 13.9 05/08/2016   HCT 40.3 05/08/2016   MCV 96 05/08/2016   PLT 262 05/08/2016   No results found for: FERRITIN, IRON, TIBC, UIBC, IRONPCTSAT Lab Results  Component Value Date   RBC 4.19 (L) 05/08/2016   No results found for: KPAFRELGTCHN, LAMBDASER, KAPLAMBRATIO No results found for: IGGSERUM, IGA, IGMSERUM No results found for: Odetta Pink, SPEI   Chemistry      Component Value Date/Time   NA 141 05/08/2016 0925   NA 141 12/13/2015 0827   K 3.7 05/08/2016 0925   K 4.0 12/13/2015 0827   CL 108 05/08/2016 0925   CO2 28 05/08/2016 0925   CO2 26 12/13/2015 0827   BUN 6 (L) 05/08/2016 0925   BUN 8.2 12/13/2015 0827   CREATININE 1.1 05/08/2016 0925   CREATININE 1.2 12/13/2015 0827      Component Value Date/Time   CALCIUM 9.4 05/08/2016 0925   CALCIUM 9.2 12/13/2015 0827   ALKPHOS 68 05/08/2016 0925   ALKPHOS 78 12/13/2015 0827   AST 23 05/08/2016 0925   AST 14 12/13/2015 0827   ALT 17 05/08/2016 0925   ALT 13 12/13/2015 0827   BILITOT 0.80 05/08/2016 0925   BILITOT 0.40 12/13/2015 0827     Impression and Plan: Mr. Bixler is 40 yo African American gentleman with Kaposi's sarcoma. He has responded nicely to Doxil and his lesions  continue to fade.   From my point, he is doing quite well. I think that as long as his HIV result to control, that he will not have any problems with a Kaposi's.  We will continue his monthly treatment. This is almost like a maintenance treatment.We  might want to consider moving his treatments out a little bit more to possibly every 6 weeks.  I will also order an echocardiogram on him.  I will plan  to get him back in 6 weeks. I think we can go every 6 weeks. I don't we will need any scans on him right now.   [name], MD 12/22/201710:12 AM

## 2016-05-08 NOTE — Patient Instructions (Signed)
Doxorubicin injection What is this medicine? DOXORUBICIN (dox oh ROO bi sin) is a chemotherapy drug. It is used to treat many kinds of cancer like leukemia, lymphoma, neuroblastoma, sarcoma, and Wilms' tumor. It is also used to treat bladder cancer, breast cancer, lung cancer, ovarian cancer, stomach cancer, and thyroid cancer. This medicine may be used for other purposes; ask your health care provider or pharmacist if you have questions. COMMON BRAND NAME(S): Adriamycin, Adriamycin PFS, Adriamycin RDF, Rubex What should I tell my health care provider before I take this medicine? They need to know if you have any of these conditions: -heart disease -history of low blood counts caused by a medicine -liver disease -recent or ongoing radiation therapy -an unusual or allergic reaction to doxorubicin, other chemotherapy agents, other medicines, foods, dyes, or preservatives -pregnant or trying to get pregnant -breast-feeding How should I use this medicine? This drug is given as an infusion into a vein. It is administered in a hospital or clinic by a specially trained health care professional. If you have pain, swelling, burning or any unusual feeling around the site of your injection, tell your health care professional right away. Talk to your pediatrician regarding the use of this medicine in children. Special care may be needed. Overdosage: If you think you have taken too much of this medicine contact a poison control center or emergency room at once. NOTE: This medicine is only for you. Do not share this medicine with others. What if I miss a dose? It is important not to miss your dose. Call your doctor or health care professional if you are unable to keep an appointment. What may interact with this medicine? This medicine may interact with the following medications: -6-mercaptopurine -paclitaxel -phenytoin -St. John's Wort -trastuzumab -verapamil This list may not describe all possible  interactions. Give your health care provider a list of all the medicines, herbs, non-prescription drugs, or dietary supplements you use. Also tell them if you smoke, drink alcohol, or use illegal drugs. Some items may interact with your medicine. What should I watch for while using this medicine? This drug may make you feel generally unwell. This is not uncommon, as chemotherapy can affect healthy cells as well as cancer cells. Report any side effects. Continue your course of treatment even though you feel ill unless your doctor tells you to stop. There is a maximum amount of this medicine you should receive throughout your life. The amount depends on the medical condition being treated and your overall health. Your doctor will watch how much of this medicine you receive in your lifetime. Tell your doctor if you have taken this medicine before. You may need blood work done while you are taking this medicine. Your urine may turn red for a few days after your dose. This is not blood. If your urine is dark or brown, call your doctor. In some cases, you may be given additional medicines to help with side effects. Follow all directions for their use. Call your doctor or health care professional for advice if you get a fever, chills or sore throat, or other symptoms of a cold or flu. Do not treat yourself. This drug decreases your body's ability to fight infections. Try to avoid being around people who are sick. This medicine may increase your risk to bruise or bleed. Call your doctor or health care professional if you notice any unusual bleeding. Talk to your doctor about your risk of cancer. You may be more at risk for certain   types of cancers if you take this medicine. Do not become pregnant while taking this medicine or for 6 months after stopping it. Women should inform their doctor if they wish to become pregnant or think they might be pregnant. Men should not father a child while taking this medicine and  for 6 months after stopping it. There is a potential for serious side effects to an unborn child. Talk to your health care professional or pharmacist for more information. Do not breast-feed an infant while taking this medicine. This medicine has caused ovarian failure in some women and reduced sperm counts in some men This medicine may interfere with the ability to have a child. Talk with your doctor or health care professional if you are concerned about your fertility. What side effects may I notice from receiving this medicine? Side effects that you should report to your doctor or health care professional as soon as possible: -allergic reactions like skin rash, itching or hives, swelling of the face, lips, or tongue -breathing problems -chest pain -fast or irregular heartbeat -low blood counts - this medicine may decrease the number of white blood cells, red blood cells and platelets. You may be at increased risk for infections and bleeding. -pain, redness, or irritation at site where injected -signs of infection - fever or chills, cough, sore throat, pain or difficulty passing urine -signs of decreased platelets or bleeding - bruising, pinpoint red spots on the skin, black, tarry stools, blood in the urine -swelling of the ankles, feet, hands -tiredness -weakness Side effects that usually do not require medical attention (report to your doctor or health care professional if they continue or are bothersome): -diarrhea -hair loss -mouth sores -nail discoloration or damage -nausea -red colored urine -vomiting This list may not describe all possible side effects. Call your doctor for medical advice about side effects. You may report side effects to FDA at 1-800-FDA-1088. Where should I keep my medicine? This drug is given in a hospital or clinic and will not be stored at home. NOTE: This sheet is a summary. It may not cover all possible information. If you have questions about this  medicine, talk to your doctor, pharmacist, or health care provider.  2017 Elsevier/Gold Standard (2015-07-01 11:28:51)

## 2016-05-12 ENCOUNTER — Other Ambulatory Visit: Payer: Self-pay | Admitting: Internal Medicine

## 2016-05-12 DIAGNOSIS — B20 Human immunodeficiency virus [HIV] disease: Secondary | ICD-10-CM

## 2016-05-15 ENCOUNTER — Other Ambulatory Visit: Payer: Self-pay | Admitting: Hematology & Oncology

## 2016-05-19 ENCOUNTER — Other Ambulatory Visit: Payer: Self-pay | Admitting: Hematology & Oncology

## 2016-05-22 ENCOUNTER — Other Ambulatory Visit: Payer: Self-pay | Admitting: *Deleted

## 2016-05-22 MED ORDER — DRONABINOL 5 MG PO CAPS: ORAL_CAPSULE | ORAL | 0 refills | Status: DC

## 2016-05-25 ENCOUNTER — Other Ambulatory Visit: Payer: Self-pay | Admitting: *Deleted

## 2016-05-25 DIAGNOSIS — G8929 Other chronic pain: Secondary | ICD-10-CM

## 2016-05-25 DIAGNOSIS — F32A Depression, unspecified: Secondary | ICD-10-CM

## 2016-05-25 DIAGNOSIS — M25562 Pain in left knee: Secondary | ICD-10-CM

## 2016-05-25 DIAGNOSIS — F329 Major depressive disorder, single episode, unspecified: Secondary | ICD-10-CM

## 2016-05-25 DIAGNOSIS — R64 Cachexia: Secondary | ICD-10-CM

## 2016-05-25 DIAGNOSIS — B20 Human immunodeficiency virus [HIV] disease: Secondary | ICD-10-CM

## 2016-05-25 DIAGNOSIS — C469 Kaposi's sarcoma, unspecified: Secondary | ICD-10-CM

## 2016-05-25 MED ORDER — GABAPENTIN 300 MG PO CAPS: ORAL_CAPSULE | ORAL | 0 refills | Status: DC

## 2016-05-25 MED ORDER — DARUNAVIR ETHANOLATE 800 MG PO TABS: ORAL_TABLET | ORAL | 5 refills | Status: DC

## 2016-05-25 MED ORDER — MECLIZINE HCL 25 MG PO TABS: ORAL_TABLET | ORAL | 0 refills | Status: DC

## 2016-05-25 MED ORDER — ONDANSETRON HCL 8 MG PO TABS: 8.0000 mg | ORAL_TABLET | Freq: Two times a day (BID) | ORAL | 6 refills | Status: AC

## 2016-05-25 MED ORDER — CITALOPRAM HYDROBROMIDE 10 MG PO TABS
ORAL_TABLET | ORAL | 11 refills | Status: DC
Start: 2016-05-25 — End: 2016-09-21

## 2016-05-25 MED ORDER — ELVITEG-COBIC-EMTRICIT-TENOFAF 150-150-200-10 MG PO TABS: 1.0000 | ORAL_TABLET | Freq: Every day | ORAL | 5 refills | Status: DC

## 2016-05-25 MED ORDER — TRAZODONE HCL 100 MG PO TABS: ORAL_TABLET | ORAL | 0 refills | Status: DC

## 2016-05-27 DIAGNOSIS — K602 Anal fissure, unspecified: Secondary | ICD-10-CM | POA: Diagnosis not present

## 2016-06-10 ENCOUNTER — Ambulatory Visit (HOSPITAL_BASED_OUTPATIENT_CLINIC_OR_DEPARTMENT_OTHER)
Admission: RE | Admit: 2016-06-10 | Discharge: 2016-06-10 | Disposition: A | Discharge status: Home / Self Care | Payer: Medicare Other | Source: Ambulatory Visit | Attending: Hematology & Oncology | Admitting: Hematology & Oncology

## 2016-06-10 ENCOUNTER — Telehealth: Payer: Self-pay | Admitting: *Deleted

## 2016-06-10 DIAGNOSIS — C469 Kaposi's sarcoma, unspecified: Secondary | ICD-10-CM

## 2016-06-10 NOTE — Telephone Encounter (Addendum)
Patient is aware of results  ----- Message from [name], MD sent at 06/10/2016  1:11 PM EST ----- Call and let him know that his heart is still pumping like a champion.Todd Vincent

## 2016-06-10 NOTE — Progress Notes (Signed)
  Echocardiogram 2D Echocardiogram has been performed.  Todd Vincent 06/10/2016, 9:08 AM

## 2016-06-18 ENCOUNTER — Other Ambulatory Visit: Payer: Self-pay | Admitting: Internal Medicine

## 2016-06-18 ENCOUNTER — Other Ambulatory Visit: Payer: Medicare Other

## 2016-06-18 DIAGNOSIS — B20 Human immunodeficiency virus [HIV] disease: Secondary | ICD-10-CM

## 2016-06-19 ENCOUNTER — Ambulatory Visit (HOSPITAL_BASED_OUTPATIENT_CLINIC_OR_DEPARTMENT_OTHER): Payer: Medicare Other | Admitting: Family

## 2016-06-19 ENCOUNTER — Ambulatory Visit (HOSPITAL_BASED_OUTPATIENT_CLINIC_OR_DEPARTMENT_OTHER): Payer: Medicare Other

## 2016-06-19 ENCOUNTER — Other Ambulatory Visit (HOSPITAL_BASED_OUTPATIENT_CLINIC_OR_DEPARTMENT_OTHER): Payer: Medicare Other

## 2016-06-19 VITALS — BP 112/68 | HR 102 | Temp 98.6°F | Wt 152.1 lb

## 2016-06-19 DIAGNOSIS — Z5111 Encounter for antineoplastic chemotherapy: Secondary | ICD-10-CM

## 2016-06-19 DIAGNOSIS — C469 Kaposi's sarcoma, unspecified: Secondary | ICD-10-CM

## 2016-06-19 DIAGNOSIS — Z21 Asymptomatic human immunodeficiency virus [HIV] infection status: Secondary | ICD-10-CM | POA: Diagnosis not present

## 2016-06-19 LAB — CMP (CANCER CENTER ONLY)
ALBUMIN: 3.9 g/dL (ref 3.3–5.5)
ALT(SGPT): 14 U/L (ref 10–47)
AST: 18 U/L (ref 11–38)
Alkaline Phosphatase: 69 U/L (ref 26–84)
BILIRUBIN TOTAL: 0.9 mg/dL (ref 0.20–1.60)
BUN: 10 mg/dL (ref 7–22)
CHLORIDE: 102 meq/L (ref 98–108)
CO2: 29 meq/L (ref 18–33)
CREATININE: 0.9 mg/dL (ref 0.6–1.2)
Calcium: 9.1 mg/dL (ref 8.0–10.3)
Glucose, Bld: 109 mg/dL (ref 73–118)
Potassium: 3.7 mEq/L (ref 3.3–4.7)
SODIUM: 139 meq/L (ref 128–145)
TOTAL PROTEIN: 7 g/dL (ref 6.4–8.1)

## 2016-06-19 LAB — CBC WITH DIFFERENTIAL (CANCER CENTER ONLY)
BASO#: 0 10*3/uL (ref 0.0–0.2)
BASO%: 0.2 % (ref 0.0–2.0)
EOS ABS: 0.3 10*3/uL (ref 0.0–0.5)
EOS%: 2.5 % (ref 0.0–7.0)
HCT: 42.7 % (ref 38.7–49.9)
HEMOGLOBIN: 14.5 g/dL (ref 13.0–17.1)
LYMPH#: 3.7 10*3/uL — ABNORMAL HIGH (ref 0.9–3.3)
LYMPH%: 31.3 % (ref 14.0–48.0)
MCH: 33.7 pg — AB (ref 28.0–33.4)
MCHC: 34 g/dL (ref 32.0–35.9)
MCV: 99 fL — ABNORMAL HIGH (ref 82–98)
MONO#: 0.9 10*3/uL (ref 0.1–0.9)
MONO%: 8 % (ref 0.0–13.0)
NEUT%: 58 % (ref 40.0–80.0)
NEUTROS ABS: 6.8 10*3/uL — AB (ref 1.5–6.5)
PLATELETS: 187 10*3/uL (ref 145–400)
RBC: 4.3 10*6/uL (ref 4.20–5.70)
RDW: 14.7 % (ref 11.1–15.7)
WBC: 11.7 10*3/uL — AB (ref 4.0–10.0)

## 2016-06-19 LAB — T-HELPER CELL (CD4) - (RCID CLINIC ONLY)
CD4 T CELL ABS: 310 /uL — AB (ref 400–2700)
CD4 T CELL HELPER: 18 % — AB (ref 33–55)

## 2016-06-19 MED ORDER — DOXORUBICIN HCL LIPOSOMAL CHEMO INJECTION 2 MG/ML
20.0000 mg/m2 | Freq: Once | INTRAVENOUS | Status: AC
  Administered 2016-06-19: 36 mg via INTRAVENOUS
  Filled 2016-06-19: qty 18

## 2016-06-19 MED ORDER — DEXAMETHASONE SODIUM PHOSPHATE 10 MG/ML IJ SOLN
10.0000 mg | Freq: Once | INTRAMUSCULAR | Status: AC
  Administered 2016-06-19: 10 mg via INTRAVENOUS

## 2016-06-19 MED ORDER — ONDANSETRON HCL 8 MG PO TABS
8.0000 mg | ORAL_TABLET | Freq: Once | ORAL | Status: AC
  Administered 2016-06-19: 8 mg via ORAL

## 2016-06-19 MED ORDER — DEXTROSE 5 % IV SOLN: Freq: Once | INTRAVENOUS | Status: DC

## 2016-06-19 MED ORDER — ONDANSETRON HCL 8 MG PO TABS
ORAL_TABLET | ORAL | Status: AC
  Filled 2016-06-19: qty 1

## 2016-06-19 MED ORDER — DEXAMETHASONE SODIUM PHOSPHATE 10 MG/ML IJ SOLN
INTRAMUSCULAR | Status: AC
  Filled 2016-06-19: qty 1

## 2016-06-19 NOTE — Patient Instructions (Signed)
Doxorubicin injection What is this medicine? DOXORUBICIN (dox oh ROO bi sin) is a chemotherapy drug. It is used to treat many kinds of cancer like leukemia, lymphoma, neuroblastoma, sarcoma, and Wilms' tumor. It is also used to treat bladder cancer, breast cancer, lung cancer, ovarian cancer, stomach cancer, and thyroid cancer. This medicine may be used for other purposes; ask your health care provider or pharmacist if you have questions. COMMON BRAND NAME(S): Adriamycin, Adriamycin PFS, Adriamycin RDF, Rubex What should I tell my health care provider before I take this medicine? They need to know if you have any of these conditions: -heart disease -history of low blood counts caused by a medicine -liver disease -recent or ongoing radiation therapy -an unusual or allergic reaction to doxorubicin, other chemotherapy agents, other medicines, foods, dyes, or preservatives -pregnant or trying to get pregnant -breast-feeding How should I use this medicine? This drug is given as an infusion into a vein. It is administered in a hospital or clinic by a specially trained health care professional. If you have pain, swelling, burning or any unusual feeling around the site of your injection, tell your health care professional right away. Talk to your pediatrician regarding the use of this medicine in children. Special care may be needed. Overdosage: If you think you have taken too much of this medicine contact a poison control center or emergency room at once. NOTE: This medicine is only for you. Do not share this medicine with others. What if I miss a dose? It is important not to miss your dose. Call your doctor or health care professional if you are unable to keep an appointment. What may interact with this medicine? This medicine may interact with the following medications: -6-mercaptopurine -paclitaxel -phenytoin -St. John's Wort -trastuzumab -verapamil This list may not describe all possible  interactions. Give your health care provider a list of all the medicines, herbs, non-prescription drugs, or dietary supplements you use. Also tell them if you smoke, drink alcohol, or use illegal drugs. Some items may interact with your medicine. What should I watch for while using this medicine? This drug may make you feel generally unwell. This is not uncommon, as chemotherapy can affect healthy cells as well as cancer cells. Report any side effects. Continue your course of treatment even though you feel ill unless your doctor tells you to stop. There is a maximum amount of this medicine you should receive throughout your life. The amount depends on the medical condition being treated and your overall health. Your doctor will watch how much of this medicine you receive in your lifetime. Tell your doctor if you have taken this medicine before. You may need blood work done while you are taking this medicine. Your urine may turn red for a few days after your dose. This is not blood. If your urine is dark or brown, call your doctor. In some cases, you may be given additional medicines to help with side effects. Follow all directions for their use. Call your doctor or health care professional for advice if you get a fever, chills or sore throat, or other symptoms of a cold or flu. Do not treat yourself. This drug decreases your body's ability to fight infections. Try to avoid being around people who are sick. This medicine may increase your risk to bruise or bleed. Call your doctor or health care professional if you notice any unusual bleeding. Talk to your doctor about your risk of cancer. You may be more at risk for certain   types of cancers if you take this medicine. Do not become pregnant while taking this medicine or for 6 months after stopping it. Women should inform their doctor if they wish to become pregnant or think they might be pregnant. Men should not father a child while taking this medicine and  for 6 months after stopping it. There is a potential for serious side effects to an unborn child. Talk to your health care professional or pharmacist for more information. Do not breast-feed an infant while taking this medicine. This medicine has caused ovarian failure in some women and reduced sperm counts in some men This medicine may interfere with the ability to have a child. Talk with your doctor or health care professional if you are concerned about your fertility. What side effects may I notice from receiving this medicine? Side effects that you should report to your doctor or health care professional as soon as possible: -allergic reactions like skin rash, itching or hives, swelling of the face, lips, or tongue -breathing problems -chest pain -fast or irregular heartbeat -low blood counts - this medicine may decrease the number of white blood cells, red blood cells and platelets. You may be at increased risk for infections and bleeding. -pain, redness, or irritation at site where injected -signs of infection - fever or chills, cough, sore throat, pain or difficulty passing urine -signs of decreased platelets or bleeding - bruising, pinpoint red spots on the skin, black, tarry stools, blood in the urine -swelling of the ankles, feet, hands -tiredness -weakness Side effects that usually do not require medical attention (report to your doctor or health care professional if they continue or are bothersome): -diarrhea -hair loss -mouth sores -nail discoloration or damage -nausea -red colored urine -vomiting This list may not describe all possible side effects. Call your doctor for medical advice about side effects. You may report side effects to FDA at 1-800-FDA-1088. Where should I keep my medicine? This drug is given in a hospital or clinic and will not be stored at home. NOTE: This sheet is a summary. It may not cover all possible information. If you have questions about this  medicine, talk to your doctor, pharmacist, or health care provider.  2017 Elsevier/Gold Standard (2015-07-01 11:28:51)

## 2016-06-19 NOTE — Progress Notes (Signed)
Hematology and Oncology Follow Up Visit  Todd Vincent [id number] Jul 28, 1975 41 y.o. 06/19/2016   Principle Diagnosis:  Kaposi's sarcoma - progressive HIV-asymptomatic  Current Therapy:   Doxil- q 4 week dosing     Interim History:  Todd Vincent is here today for a follow-up. He is doing a well and states that he is excited for his consult with surgery in [place] scheduled for in March. They plan to re-evaluate him for ostomy reversal.  He is doing well on Doxil and has had no new lesions appear. No rashes. Hyperpigmentation continues to improve. ECHO showed an EF of 55-60%.  His HIV has been well controlled. His CD4 in August was 290. He follows up with ID Dr. Linus Salmons later this month.   He has had no issue with infections. No fever, chills, n/v, cough, headaches, dizziness, blurred vision, SOB, chest pain, palpitations, abdominal pain or changes in bladder habits.  The chronic "puffiness" in his ankles and feet is unchanged. No numbness, tingling or weakness in his extremities.  He has maintained a good appetite and is staying hydrated. His weight is stable.   Medications:  Allergies as of 06/19/2016      Reactions   Sulfa Antibiotics Rash   Extreme itching       Medication List       Accurate as of 06/19/16  8:42 AM. Always use your most recent med list.          acyclovir 800 MG tablet Commonly known as:  ZOVIRAX Take 800 mg by mouth 5 (five) times daily.   ascorbic acid 500 MG tablet Commonly known as:  VITAMIN C Take by mouth.   citalopram 10 MG tablet Commonly known as:  CELEXA TAKE 1 TABLET(10 MG) BY MOUTH DAILY   COCONUT OIL PO Take 1,000 mg by mouth 2 (two) times daily.   dapsone 100 MG tablet Take 1 tablet (100 mg total) by mouth daily.   Darunavir Ethanolate 800 MG tablet Commonly known as:  PREZISTA TAKE 1 TABLET BY MOUTH EVERY DAY WITH BREAKFAST   dronabinol 5 MG capsule Commonly known as:  MARINOL TAKE 1 CAPSULE BY MOUTH TWICE DAILY BEFORE LUNCH  AND BEFORE SUPPER   elvitegravir-cobicistat-emtricitabine-tenofovir 150-150-200-10 MG Tabs tablet Commonly known as:  GENVOYA Take 1 tablet by mouth daily.   ferrous sulfate 325 (65 FE) MG EC tablet Take by mouth.   gabapentin 300 MG capsule Commonly known as:  NEURONTIN TAKE 1 CAPSULE(300 MG) BY MOUTH THREE TIMES DAILY   lidocaine 5 % Commonly known as:  LIDODERM APPLY 1 PATCH TO THE SKIN DAILY. REMOVE AND DISCARD PATCH WITHIN 12 HOURS OR AS DIRECTED BY MD   LORazepam 0.5 MG tablet Commonly known as:  ATIVAN TAKE 1 BY MOUTH EVERY 6 HOURS AS NEEDED   LYRICA 75 MG capsule Generic drug:  pregabalin Take 75 mg by mouth as needed.   meclizine 25 MG tablet Commonly known as:  ANTIVERT TAKE 1 TABLET(25 MG) BY MOUTH EVERY 6 HOURS AS NEEDED   ondansetron 8 MG tablet Commonly known as:  ZOFRAN Take 1 tablet (8 mg total) by mouth 2 (two) times daily. Start the day after chemo for 2 days. Then take as needed for nausea or vomiting.   traZODone 100 MG tablet Commonly known as:  DESYREL TAKE 1 TABLET(100 MG) BY MOUTH AT BEDTIME   triamterene-hydrochlorothiazide 37.5-25 MG tablet Commonly known as:  MAXZIDE-25 Take 1 tablet by mouth daily.   valACYclovir 1000 MG tablet Commonly known  as:  VALTREX Take 1 g by mouth as needed.   Vitamin D3 2000 units Tabs Take 2,000 Units by mouth every morning. 2 tabs = 4,000 U daily       Allergies:  Allergies  Allergen Reactions  . Sulfa Antibiotics Rash    Extreme itching     Past Medical History, Surgical history, Social history, and Family History were reviewed and updated.  Review of Systems: All other 10 point review of systems is negative.   Physical Exam:  weight is 152 lb 1.9 oz (69 kg). His oral temperature is 98.6 F (37 C). His blood pressure is 112/68 and his pulse is 102 (abnormal).   Wt Readings from Last 3 Encounters:  06/19/16 152 lb 1.9 oz (69 kg)  05/08/16 154 lb (69.9 kg)  03/30/16 156 lb 6.4 oz (70.9 kg)     Ocular: Sclerae unicteric, pupils equal, round and reactive to light Ear-nose-throat: Oropharynx clear, dentition fair Lymphatic: No cervical supraclavicular or axillary adenopathy Lungs no rales or rhonchi, good excursion bilaterally Heart regular rate and rhythm, no murmur appreciated Abd soft, nontender, positive bowel sounds, no liver or spleen tip palpated on exam, no fluid wave MSK no focal spinal tenderness, no joint edema Neuro: non-focal, well-oriented, appropriate affect  Lab Results  Component Value Date   WBC 11.7 (H) 06/19/2016   HGB 14.5 06/19/2016   HCT 42.7 06/19/2016   MCV 99 (H) 06/19/2016   PLT 187 06/19/2016   No results found for: FERRITIN, IRON, TIBC, UIBC, IRONPCTSAT Lab Results  Component Value Date   RBC 4.30 06/19/2016   No results found for: KPAFRELGTCHN, LAMBDASER, KAPLAMBRATIO No results found for: IGGSERUM, IGA, IGMSERUM No results found for: Odetta Pink, SPEI   Chemistry      Component Value Date/Time   NA 141 05/08/2016 0925   NA 141 12/13/2015 0827   K 3.7 05/08/2016 0925   K 4.0 12/13/2015 0827   CL 108 05/08/2016 0925   CO2 28 05/08/2016 0925   CO2 26 12/13/2015 0827   BUN 6 (L) 05/08/2016 0925   BUN 8.2 12/13/2015 0827   CREATININE 1.1 05/08/2016 0925   CREATININE 1.2 12/13/2015 0827      Component Value Date/Time   CALCIUM 9.4 05/08/2016 0925   CALCIUM 9.2 12/13/2015 0827   ALKPHOS 68 05/08/2016 0925   ALKPHOS 78 12/13/2015 0827   AST 23 05/08/2016 0925   AST 14 12/13/2015 0827   ALT 17 05/08/2016 0925   ALT 13 12/13/2015 0827   BILITOT 0.80 05/08/2016 0925   BILITOT 0.40 12/13/2015 0827     Impression and Plan: Todd Vincent is 41 yo African American gentleman with Kaposi's sarcoma. He has responded nicely to Doxil and has no complaints at this time.  His HIV continues to be well controlled. We will proceed with treatment today as planned.  We will plan to see him  back for follow-up and maintenance treatment in 6 weeks.  He will contact us with any questions or concerns. We can certainly see him sooner if need be.   Eliezer Bottom, NP 2/2/20188:42 AM

## 2016-06-22 LAB — HIV-1 RNA QUANT-NO REFLEX-BLD
HIV 1 RNA Quant: 20 copies/mL — ABNORMAL HIGH
HIV-1 RNA Quant, Log: 1.3 Log copies/mL — ABNORMAL HIGH

## 2016-06-23 ENCOUNTER — Other Ambulatory Visit: Payer: Medicare Other

## 2016-06-25 ENCOUNTER — Other Ambulatory Visit: Payer: Self-pay | Admitting: Hematology & Oncology

## 2016-07-07 ENCOUNTER — Encounter: Payer: Self-pay | Admitting: Internal Medicine

## 2016-07-07 ENCOUNTER — Ambulatory Visit (INDEPENDENT_AMBULATORY_CARE_PROVIDER_SITE_OTHER): Payer: Medicare Other | Admitting: Internal Medicine

## 2016-07-07 VITALS — BP 126/82 | HR 76 | Temp 98.6°F | Ht 67.0 in | Wt 152.0 lb

## 2016-07-07 DIAGNOSIS — B20 Human immunodeficiency virus [HIV] disease: Secondary | ICD-10-CM | POA: Diagnosis not present

## 2016-07-07 DIAGNOSIS — Z79899 Other long term (current) drug therapy: Secondary | ICD-10-CM | POA: Diagnosis not present

## 2016-07-07 DIAGNOSIS — N289 Disorder of kidney and ureter, unspecified: Secondary | ICD-10-CM | POA: Diagnosis not present

## 2016-07-07 DIAGNOSIS — Z113 Encounter for screening for infections with a predominantly sexual mode of transmission: Secondary | ICD-10-CM

## 2016-07-07 DIAGNOSIS — Z23 Encounter for immunization: Secondary | ICD-10-CM | POA: Diagnosis not present

## 2016-07-08 NOTE — Assessment & Plan Note (Signed)
Doing well on current regimen.  rtc 6 months.

## 2016-07-08 NOTE — Assessment & Plan Note (Signed)
Creat has remained stable.   

## 2016-07-08 NOTE — Progress Notes (Signed)
CC: Follow up for HIV  Interval history: Currently is asymptomatic and well-controlled on Genvoya and prezista.  Since last visit he has continued on monthly chemo for his Kaposi's sarcoma.  Lesions have been fading.  Has no associated n/v/d.  Denies any missed doses.     Also has a colostomy and is hopeful to have a reversal soon and is going back to see his original Psychologist, sport and exercise from [place] who is now in [place].     Prior to Admission medications   Medication Sig Start Date End Date Taking? Authorizing Provider  acyclovir (ZOVIRAX) 800 MG tablet Take 800 mg by mouth 5 (five) times daily.   Yes Historical Provider, MD  ascorbic acid (VITAMIN C) 500 MG tablet Take by mouth. 07/13/14  Yes Historical Provider, MD  Cholecalciferol (VITAMIN D3) 2000 UNITS TABS Take 2,000 Units by mouth every morning. 2 tabs = 4,000 U daily   Yes Historical Provider, MD  citalopram (CELEXA) 10 MG tablet TAKE 1 TABLET(10 MG) BY MOUTH DAILY 05/25/16  Yes [name], MD  COCONUT OIL PO Take 1,000 mg by mouth 2 (two) times daily.   Yes Historical Provider, MD  dapsone 100 MG tablet Take 1 tablet (100 mg total) by mouth daily. 02/21/16  Yes Truman Hayward, MD  Darunavir Ethanolate (PREZISTA) 800 MG tablet TAKE 1 TABLET BY MOUTH EVERY DAY WITH BREAKFAST 05/25/16  Yes Thayer Headings, MD  dronabinol (MARINOL) 5 MG capsule TAKE ONE CAPSULE BY MOUTH TWICE A DAY BEFORE LUNCH AND SUPPER 06/25/16  Yes [name], MD  elvitegravir-cobicistat-emtricitabine-tenofovir (GENVOYA) 150-150-200-10 MG TABS tablet Take 1 tablet by mouth daily. 05/25/16  Yes Thayer Headings, MD  gabapentin (NEURONTIN) 300 MG capsule TAKE 1 CAPSULE(300 MG) BY MOUTH THREE TIMES DAILY 05/25/16  Yes [name], MD  lidocaine (LIDODERM) 5 % APPLY 1 PATCH TO THE SKIN DAILY. REMOVE AND DISCARD PATCH WITHIN 12 HOURS OR AS DIRECTED BY MD 01/27/16  Yes [place], NP  LORazepam (ATIVAN) 0.5 MG tablet TAKE 1 BY MOUTH EVERY 6 HOURS AS NEEDED 01/27/16  Yes [name], MD  LYRICA 75 MG capsule Take 75 mg by mouth as needed. 05/27/16  Yes Historical Provider, MD  meclizine (ANTIVERT) 25 MG tablet TAKE 1 TABLET(25 MG) BY MOUTH EVERY 6 HOURS AS NEEDED 06/25/16  Yes [name], MD  ondansetron (ZOFRAN) 8 MG tablet Take 1 tablet (8 mg total) by mouth 2 (two) times daily. Start the day after chemo for 2 days. Then take as needed for nausea or vomiting. 05/25/16  Yes [name], MD  traZODone (DESYREL) 100 MG tablet TAKE 1 TABLET(100 MG) BY MOUTH AT BEDTIME 05/25/16  Yes [name], MD  triamterene-hydrochlorothiazide (MAXZIDE-25) 37.5-25 MG tablet TAKE 1 TABLET BY MOUTH DAILY 06/25/16  Yes [name], MD  valACYclovir (VALTREX) 1000 MG tablet Take 1 g by mouth as needed. 04/03/16   Historical Provider, MD    Review of Systems Constitutional: negative for fevers Genitourinary: negative for penile discharge Integument/breast: negative for rash All other systems reviewed and are negative    Physical Exam: CONSTITUTIONAL:in no apparent distress and alert  Vitals:   07/07/16 0958  BP: 126/82  Pulse: 76  Temp: 98.6 F (37 C)   Eyes: anicteric HENT: no thrush, no cervical lymphadenopathy Respiratory: Normal respiratory effort; CTA B GI: + ostomy  Lab Results  Component Value Date   HIV1RNAQUANT 20 (H) 06/18/2016   HIV1RNAQUANT <20 01/14/2016  HIV1RNAQUANT 22 (H) 08/29/2015   No components found for: HIV1GENOTYPRPLUS No components found for: THELPERCELL

## 2016-07-13 DIAGNOSIS — Z932 Ileostomy status: Secondary | ICD-10-CM | POA: Diagnosis not present

## 2016-07-13 DIAGNOSIS — K602 Anal fissure, unspecified: Secondary | ICD-10-CM | POA: Diagnosis not present

## 2016-07-16 DIAGNOSIS — Z432 Encounter for attention to ileostomy: Secondary | ICD-10-CM | POA: Diagnosis not present

## 2016-07-16 DIAGNOSIS — C469 Kaposi's sarcoma, unspecified: Secondary | ICD-10-CM | POA: Diagnosis not present

## 2016-07-16 DIAGNOSIS — A630 Anogenital (venereal) warts: Secondary | ICD-10-CM | POA: Diagnosis not present

## 2016-07-16 DIAGNOSIS — K603 Anal fistula: Secondary | ICD-10-CM | POA: Diagnosis not present

## 2016-07-22 ENCOUNTER — Other Ambulatory Visit: Payer: Self-pay | Admitting: Hematology & Oncology

## 2016-07-24 ENCOUNTER — Other Ambulatory Visit: Payer: Self-pay | Admitting: Hematology & Oncology

## 2016-07-30 DIAGNOSIS — C469 Kaposi's sarcoma, unspecified: Secondary | ICD-10-CM | POA: Diagnosis not present

## 2016-07-30 DIAGNOSIS — Z432 Encounter for attention to ileostomy: Secondary | ICD-10-CM | POA: Diagnosis not present

## 2016-07-30 DIAGNOSIS — K603 Anal fistula: Secondary | ICD-10-CM | POA: Diagnosis not present

## 2016-07-30 DIAGNOSIS — K6389 Other specified diseases of intestine: Secondary | ICD-10-CM | POA: Diagnosis not present

## 2016-07-31 ENCOUNTER — Other Ambulatory Visit (HOSPITAL_BASED_OUTPATIENT_CLINIC_OR_DEPARTMENT_OTHER): Payer: Medicare Other

## 2016-07-31 ENCOUNTER — Ambulatory Visit (HOSPITAL_BASED_OUTPATIENT_CLINIC_OR_DEPARTMENT_OTHER): Payer: Medicare Other

## 2016-07-31 ENCOUNTER — Ambulatory Visit (HOSPITAL_BASED_OUTPATIENT_CLINIC_OR_DEPARTMENT_OTHER): Payer: Medicare Other | Admitting: Hematology & Oncology

## 2016-07-31 ENCOUNTER — Encounter: Payer: Self-pay | Admitting: Hematology & Oncology

## 2016-07-31 VITALS — BP 113/70 | HR 86 | Temp 98.1°F | Resp 16 | Wt 155.0 lb

## 2016-07-31 DIAGNOSIS — C469 Kaposi's sarcoma, unspecified: Secondary | ICD-10-CM

## 2016-07-31 DIAGNOSIS — Z5111 Encounter for antineoplastic chemotherapy: Secondary | ICD-10-CM

## 2016-07-31 LAB — CBC WITH DIFFERENTIAL (CANCER CENTER ONLY)
BASO#: 0 10*3/uL (ref 0.0–0.2)
BASO%: 0.2 % (ref 0.0–2.0)
EOS%: 2.9 % (ref 0.0–7.0)
Eosinophils Absolute: 0.2 10*3/uL (ref 0.0–0.5)
HCT: 40.9 % (ref 38.7–49.9)
HEMOGLOBIN: 13.8 g/dL (ref 13.0–17.1)
LYMPH#: 2.7 10*3/uL (ref 0.9–3.3)
LYMPH%: 32.5 % (ref 14.0–48.0)
MCH: 33.2 pg (ref 28.0–33.4)
MCHC: 33.7 g/dL (ref 32.0–35.9)
MCV: 98 fL (ref 82–98)
MONO#: 0.9 10*3/uL (ref 0.1–0.9)
MONO%: 10.4 % (ref 0.0–13.0)
NEUT%: 54 % (ref 40.0–80.0)
NEUTROS ABS: 4.5 10*3/uL (ref 1.5–6.5)
PLATELETS: 223 10*3/uL (ref 145–400)
RBC: 4.16 10*6/uL — AB (ref 4.20–5.70)
RDW: 13.1 % (ref 11.1–15.7)
WBC: 8.4 10*3/uL (ref 4.0–10.0)

## 2016-07-31 LAB — CMP (CANCER CENTER ONLY)
ALBUMIN: 3.6 g/dL (ref 3.3–5.5)
ALT(SGPT): 17 U/L (ref 10–47)
AST: 18 U/L (ref 11–38)
Alkaline Phosphatase: 80 U/L (ref 26–84)
BILIRUBIN TOTAL: 0.8 mg/dL (ref 0.20–1.60)
BUN: 9 mg/dL (ref 7–22)
CO2: 28 meq/L (ref 18–33)
CREATININE: 1.1 mg/dL (ref 0.6–1.2)
Calcium: 9.2 mg/dL (ref 8.0–10.3)
Chloride: 104 mEq/L (ref 98–108)
Glucose, Bld: 109 mg/dL (ref 73–118)
Potassium: 3.5 mEq/L (ref 3.3–4.7)
SODIUM: 140 meq/L (ref 128–145)
Total Protein: 6.8 g/dL (ref 6.4–8.1)

## 2016-07-31 MED ORDER — DEXTROSE 5 % IV SOLN
Freq: Once | INTRAVENOUS | Status: AC
  Administered 2016-07-31: 09:00:00 via INTRAVENOUS

## 2016-07-31 MED ORDER — ONDANSETRON HCL 8 MG PO TABS
8.0000 mg | ORAL_TABLET | Freq: Once | ORAL | Status: AC
Start: 2016-07-31 — End: 2016-07-31
  Administered 2016-07-31: 8 mg via ORAL

## 2016-07-31 MED ORDER — ONDANSETRON HCL 8 MG PO TABS
ORAL_TABLET | ORAL | Status: AC
  Filled 2016-07-31: qty 1

## 2016-07-31 MED ORDER — DEXAMETHASONE SODIUM PHOSPHATE 10 MG/ML IJ SOLN
10.0000 mg | Freq: Once | INTRAMUSCULAR | Status: AC
  Administered 2016-07-31: 10 mg via INTRAVENOUS

## 2016-07-31 MED ORDER — DOXORUBICIN HCL LIPOSOMAL CHEMO INJECTION 2 MG/ML
20.0000 mg/m2 | Freq: Once | INTRAVENOUS | Status: AC
  Administered 2016-07-31: 36 mg via INTRAVENOUS
  Filled 2016-07-31: qty 18

## 2016-07-31 MED ORDER — DEXAMETHASONE SODIUM PHOSPHATE 10 MG/ML IJ SOLN
INTRAMUSCULAR | Status: AC
  Filled 2016-07-31: qty 1

## 2016-07-31 NOTE — Patient Instructions (Signed)
Doxorubicin Liposomal injection What is this medicine? LIPOSOMAL DOXORUBICIN (LIP oh som al dox oh ROO bi sin) is a chemotherapy drug. This medicine is used to treat many kinds of cancer like Kaposi's sarcoma, multiple myeloma, and ovarian cancer. This medicine may be used for other purposes; ask your health care provider or pharmacist if you have questions. COMMON BRAND NAME(S): Doxil, Lipodox What should I tell my health care provider before I take this medicine? They need to know if you have any of these conditions: -blood disorders -heart disease -infection (especially a virus infection such as chickenpox, cold sores, or herpes) -liver disease -recent or ongoing radiation therapy -an unusual or allergic reaction to doxorubicin, other chemotherapy agents, soybeans, other medicines, foods, dyes, or preservatives -pregnant or trying to get pregnant -breast-feeding How should I use this medicine? This drug is given as an infusion into a vein. It is administered in a hospital or clinic by a specially trained health care professional. If you have pain, swelling, burning or any unusual feeling around the site of your injection, tell your health care professional right away. Talk to your pediatrician regarding the use of this medicine in children. Special care may be needed. Overdosage: If you think you have taken too much of this medicine contact a poison control center or emergency room at once. NOTE: This medicine is only for you. Do not share this medicine with others. What if I miss a dose? It is important not to miss your dose. Call your doctor or health care professional if you are unable to keep an appointment. What may interact with this medicine? Do not take this medicine with any of the following medications: -zidovudine This medicine may also interact with the following medications: -medicines to increase blood counts like filgrastim, pegfilgrastim, sargramostim -vaccines Talk to  your doctor or health care professional before taking any of these medicines: -acetaminophen -aspirin -ibuprofen -ketoprofen -naproxen This list may not describe all possible interactions. Give your health care provider a list of all the medicines, herbs, non-prescription drugs, or dietary supplements you use. Also tell them if you smoke, drink alcohol, or use illegal drugs. Some items may interact with your medicine. What should I watch for while using this medicine? Your condition will be monitored carefully while you are receiving this medicine. You will need important blood work done while you are taking this medicine. This drug may make you feel generally unwell. This is not uncommon, as chemotherapy can affect healthy cells as well as cancer cells. Report any side effects. Continue your course of treatment even though you feel ill unless your doctor tells you to stop. Your urine may turn orange-red for a few days after your dose. This is not blood. If your urine is dark or brown, call your doctor. In some cases, you may be given additional medicines to help with side effects. Follow all directions for their use. Call your doctor or health care professional for advice if you get a fever (100.5 degrees F or higher), chills or sore throat, or other symptoms of a cold or flu. Do not treat yourself. This drug decreases your body's ability to fight infections. Try to avoid being around people who are sick. This medicine may increase your risk to bruise or bleed. Call your doctor or health care professional if you notice any unusual bleeding. Be careful brushing and flossing your teeth or using a toothpick because you may get an infection or bleed more easily. If you have any dental  work done, tell your dentist you are receiving this medicine. Avoid taking products that contain aspirin, acetaminophen, ibuprofen, naproxen, or ketoprofen unless instructed by your doctor. These medicines may hide a  fever. Men and women of childbearing age should use effective birth control methods while using taking this medicine. Do not become pregnant while taking this medicine. There is a potential for serious side effects to an unborn child. Talk to your health care professional or pharmacist for more information. Do not breast-feed an infant while taking this medicine. Talk to your doctor about your risk of cancer. You may be more at risk for certain types of cancers if you take this medicine. What side effects may I notice from receiving this medicine? Side effects that you should report to your doctor or health care professional as soon as possible: -allergic reactions like skin rash, itching or hives, swelling of the face, lips, or tongue -low blood counts - this medicine may decrease the number of white blood cells, red blood cells and platelets. You may be at increased risk for infections and bleeding. -signs of hand-foot syndrome - tingling or burning, redness, flaking, swelling, small blisters, or small sores on the palms of your hands or the soles of your feet -signs of infection - fever or chills, cough, sore throat, pain or difficulty passing urine -signs of decreased platelets or bleeding - bruising, pinpoint red spots on the skin, black, tarry stools, blood in the urine -signs of decreased red blood cells - unusually weak or tired, fainting spells, lightheadedness -back pain, chills, facial flushing, fever, headache, tightness in the chest or throat during the infusion -breathing problems -chest pain -fast, irregular heartbeat -mouth pain, redness, sores -pain, swelling, redness at site where injected -pain, tingling, numbness in the hands or feet -swelling of ankles, feet, or hands -vomiting Side effects that usually do not require medical attention (report to your doctor or health care professional if they continue or are bothersome): -diarrhea -hair loss -loss of appetite -nail  discoloration or damage -nausea -red or watery eyes -red colored urine -stomach upset This list may not describe all possible side effects. Call your doctor for medical advice about side effects. You may report side effects to FDA at 1-800-FDA-1088. Where should I keep my medicine? This drug is given in a hospital or clinic and will not be stored at home. NOTE: This sheet is a summary. It may not cover all possible information. If you have questions about this medicine, talk to your doctor, pharmacist, or health care provider.  2018 [employer]vier/Gold Standard (2012-01-22 10:12:56)  

## 2016-07-31 NOTE — Progress Notes (Signed)
Hematology and Oncology Follow Up Visit  Todd Vincent [id number] 09-Jul-1975 41 y.o. 07/31/2016   Principle Diagnosis:  Kaposi's sarcoma - progressive HIV-asymptomatic  Current Therapy:   Doxil- q 4 week dosing     Interim History:  Todd Vincent is here today for a follow-up. He is doing a well and states that he is excited for his consult with surgery in [place] scheduled for in March. They plan to re-evaluate him for ostomy reversal.  He is doing well on Doxil and has had no new lesions appear. No rashes. Hyperpigmentation continues to improve. ECHO showed an EF of 55-60%.  His HIV has been well controlled. His CD4 in August was 290. He follows up with ID Dr. Linus Salmons later this month.   He has had no issue with infections. No fever, chills, n/v, cough, headaches, dizziness, blurred vision, SOB, chest pain, palpitations, abdominal pain or changes in bladder habits.  The chronic "puffiness" in his ankles and feet is unchanged. No numbness, tingling or weakness in his extremities.  He has maintained a good appetite and is staying hydrated. His weight is stable.   Medications:  Allergies as of 07/31/2016      Reactions   Sulfa Antibiotics Rash   Extreme itching       Medication List       Accurate as of 07/31/16  8:28 AM. Always use your most recent med list.          acyclovir 800 MG tablet Commonly known as:  ZOVIRAX Take 800 mg by mouth 5 (five) times daily.   ascorbic acid 500 MG tablet Commonly known as:  VITAMIN C Take by mouth.   citalopram 10 MG tablet Commonly known as:  CELEXA TAKE 1 TABLET(10 MG) BY MOUTH DAILY   COCONUT OIL PO Take 1,000 mg by mouth 2 (two) times daily.   dapsone 100 MG tablet Take 1 tablet (100 mg total) by mouth daily.   darunavir 800 MG tablet Commonly known as:  PREZISTA TAKE 1 TABLET BY MOUTH EVERY DAY WITH BREAKFAST   dronabinol 5 MG capsule Commonly known as:  MARINOL TAKE ONE CAPSULE BY MOUTH TWICE A DAY BEFORE LUNCH AND  SUPPER   elvitegravir-cobicistat-emtricitabine-tenofovir 150-150-200-10 MG Tabs tablet Commonly known as:  GENVOYA Take 1 tablet by mouth daily.   gabapentin 300 MG capsule Commonly known as:  NEURONTIN TAKE 1 CAPSULE(300 MG) BY MOUTH THREE TIMES DAILY   lidocaine 5 % Commonly known as:  LIDODERM APPLY 1 PATCH TO THE SKIN DAILY. REMOVE AND DISCARD PATCH WITHIN 12 HOURS OR AS DIRECTED BY MD   LORazepam 0.5 MG tablet Commonly known as:  ATIVAN TAKE 1 BY MOUTH EVERY 6 HOURS AS NEEDED   LYRICA 75 MG capsule Generic drug:  pregabalin Take 75 mg by mouth as needed.   meclizine 25 MG tablet Commonly known as:  ANTIVERT TAKE 1 TABLET(25 MG) BY MOUTH EVERY 6 HOURS AS NEEDED   ondansetron 8 MG tablet Commonly known as:  ZOFRAN Take 1 tablet (8 mg total) by mouth 2 (two) times daily. Start the day after chemo for 2 days. Then take as needed for nausea or vomiting.   traZODone 100 MG tablet Commonly known as:  DESYREL TAKE 1 TABLET(100 MG) BY MOUTH AT BEDTIME   triamterene-hydrochlorothiazide 37.5-25 MG tablet Commonly known as:  MAXZIDE-25 TAKE 1 TABLET BY MOUTH DAILY   valACYclovir 1000 MG tablet Commonly known as:  VALTREX Take 1 g by mouth as needed.   Vitamin D3  2000 units Tabs Take 2,000 Units by mouth every morning. 2 tabs = 4,000 U daily       Allergies:  Allergies  Allergen Reactions  . Sulfa Antibiotics Rash    Extreme itching     Past Medical History, Surgical history, Social history, and Family History were reviewed and updated.  Review of Systems: All other 10 point review of systems is negative.   Physical Exam:  weight is 155 lb (70.3 kg). His oral temperature is 98.1 F (36.7 C). His blood pressure is 113/70 and his pulse is 86. His respiration is 16 and oxygen saturation is 98%.   Wt Readings from Last 3 Encounters:  07/31/16 155 lb (70.3 kg)  07/07/16 152 lb (68.9 kg)  06/19/16 152 lb 1.9 oz (69 kg)    Ocular: Sclerae unicteric, pupils  equal, round and reactive to light Ear-nose-throat: Oropharynx clear, dentition fair Lymphatic: No cervical supraclavicular or axillary adenopathy Lungs no rales or rhonchi, good excursion bilaterally Heart regular rate and rhythm, no murmur appreciated Abd soft, nontender, positive bowel sounds, no liver or spleen tip palpated on exam, no fluid wave MSK no focal spinal tenderness, no joint edema Neuro: non-focal, well-oriented, appropriate affect  Lab Results  Component Value Date   WBC 8.4 07/31/2016   HGB 13.8 07/31/2016   HCT 40.9 07/31/2016   MCV 98 07/31/2016   PLT 223 07/31/2016   No results found for: FERRITIN, IRON, TIBC, UIBC, IRONPCTSAT Lab Results  Component Value Date   RBC 4.16 (L) 07/31/2016   No results found for: KPAFRELGTCHN, LAMBDASER, KAPLAMBRATIO No results found for: IGGSERUM, IGA, IGMSERUM No results found for: Odetta Pink, SPEI   Chemistry      Component Value Date/Time   NA 139 06/19/2016 0816   NA 141 12/13/2015 0827   K 3.7 06/19/2016 0816   K 4.0 12/13/2015 0827   CL 102 06/19/2016 0816   CO2 29 06/19/2016 0816   CO2 26 12/13/2015 0827   BUN 10 06/19/2016 0816   BUN 8.2 12/13/2015 0827   CREATININE 0.9 06/19/2016 0816   CREATININE 1.2 12/13/2015 0827      Component Value Date/Time   CALCIUM 9.1 06/19/2016 0816   CALCIUM 9.2 12/13/2015 0827   ALKPHOS 69 06/19/2016 0816   ALKPHOS 78 12/13/2015 0827   AST 18 06/19/2016 0816   AST 14 12/13/2015 0827   ALT 14 06/19/2016 0816   ALT 13 12/13/2015 0827   BILITOT 0.90 06/19/2016 0816   BILITOT 0.40 12/13/2015 0827     Impression and Plan: Todd Vincent is 41 yo African American gentleman with Kaposi's sarcoma. He has responded nicely to Doxil and has no complaints at this time.   His HIV continues to be well controlled.He sees his HIV physician I think in May or June.  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 As such, he really needs to try to get this  reversed. I really think that there should be no problem having this reversed. I think his immune status from the HIV is not going to affect healing. We can avoid adjust his chemotherapy protocol according to any surgical date that is scheduled.  We will plan to see him back in 6 weeks. I think this is very reasonable.  I do not see any need to do scans on him. His echocardiogram was done in January which showed a good ejection fraction.   [name], MD 3/16/20188:28 AM

## 2016-08-19 ENCOUNTER — Other Ambulatory Visit: Payer: Self-pay | Admitting: Hematology & Oncology

## 2016-08-21 ENCOUNTER — Other Ambulatory Visit: Payer: Self-pay | Admitting: Hematology & Oncology

## 2016-08-25 ENCOUNTER — Other Ambulatory Visit: Payer: Self-pay | Admitting: Hematology & Oncology

## 2016-09-11 ENCOUNTER — Ambulatory Visit: Payer: Medicare Other | Admitting: Family

## 2016-09-11 ENCOUNTER — Ambulatory Visit: Payer: Medicare Other

## 2016-09-11 ENCOUNTER — Other Ambulatory Visit: Payer: Medicare Other

## 2016-09-15 DIAGNOSIS — T161XXA Foreign body in right ear, initial encounter: Secondary | ICD-10-CM | POA: Diagnosis not present

## 2016-09-15 DIAGNOSIS — H903 Sensorineural hearing loss, bilateral: Secondary | ICD-10-CM | POA: Diagnosis not present

## 2016-09-20 ENCOUNTER — Other Ambulatory Visit: Payer: Self-pay | Admitting: Hematology & Oncology

## 2016-09-21 ENCOUNTER — Other Ambulatory Visit: Payer: Self-pay | Admitting: *Deleted

## 2016-09-21 DIAGNOSIS — F329 Major depressive disorder, single episode, unspecified: Secondary | ICD-10-CM

## 2016-09-21 DIAGNOSIS — F32A Depression, unspecified: Secondary | ICD-10-CM

## 2016-09-21 MED ORDER — TRIAMTERENE-HCTZ 37.5-25 MG PO TABS: 1.0000 | ORAL_TABLET | Freq: Every day | ORAL | 0 refills | Status: DC

## 2016-09-21 MED ORDER — MECLIZINE HCL 25 MG PO TABS: 25.0000 mg | ORAL_TABLET | Freq: Four times a day (QID) | ORAL | 0 refills | Status: DC | PRN

## 2016-09-21 MED ORDER — TRAZODONE HCL 100 MG PO TABS: 100.0000 mg | ORAL_TABLET | Freq: Every day | ORAL | 0 refills | Status: DC

## 2016-09-21 MED ORDER — CITALOPRAM HYDROBROMIDE 10 MG PO TABS: ORAL_TABLET | ORAL | 2 refills | Status: DC

## 2016-09-22 ENCOUNTER — Other Ambulatory Visit: Payer: Self-pay | Admitting: *Deleted

## 2016-09-22 DIAGNOSIS — B20 Human immunodeficiency virus [HIV] disease: Secondary | ICD-10-CM

## 2016-09-22 DIAGNOSIS — Z21 Asymptomatic human immunodeficiency virus [HIV] infection status: Secondary | ICD-10-CM

## 2016-09-22 MED ORDER — DAPSONE 100 MG PO TABS: 100.0000 mg | ORAL_TABLET | Freq: Every day | ORAL | 2 refills | Status: DC

## 2016-09-23 ENCOUNTER — Other Ambulatory Visit: Payer: Self-pay | Admitting: *Deleted

## 2016-09-23 DIAGNOSIS — B20 Human immunodeficiency virus [HIV] disease: Secondary | ICD-10-CM

## 2016-09-23 MED ORDER — ELVITEG-COBIC-EMTRICIT-TENOFAF 150-150-200-10 MG PO TABS: 1.0000 | ORAL_TABLET | Freq: Every day | ORAL | 1 refills | Status: DC

## 2016-09-23 MED ORDER — DARUNAVIR ETHANOLATE 800 MG PO TABS: ORAL_TABLET | ORAL | 1 refills | Status: DC

## 2016-09-25 ENCOUNTER — Ambulatory Visit: Payer: Medicare Other | Admitting: Hematology & Oncology

## 2016-09-25 ENCOUNTER — Other Ambulatory Visit: Payer: Medicare Other

## 2016-09-25 ENCOUNTER — Ambulatory Visit: Payer: Medicare Other

## 2016-10-01 ENCOUNTER — Other Ambulatory Visit: Payer: Self-pay | Admitting: Hematology & Oncology

## 2016-10-01 DIAGNOSIS — R21 Rash and other nonspecific skin eruption: Secondary | ICD-10-CM | POA: Diagnosis not present

## 2016-10-02 ENCOUNTER — Other Ambulatory Visit (HOSPITAL_COMMUNITY)
Admission: RE | Admit: 2016-10-02 | Discharge: 2016-10-02 | Disposition: A | Discharge status: Home / Self Care | Payer: Medicare Other | Source: Ambulatory Visit | Attending: Hematology & Oncology | Admitting: Hematology & Oncology

## 2016-10-02 ENCOUNTER — Ambulatory Visit (HOSPITAL_BASED_OUTPATIENT_CLINIC_OR_DEPARTMENT_OTHER): Payer: Medicare Other

## 2016-10-02 ENCOUNTER — Other Ambulatory Visit (HOSPITAL_BASED_OUTPATIENT_CLINIC_OR_DEPARTMENT_OTHER): Payer: Medicare Other

## 2016-10-02 ENCOUNTER — Ambulatory Visit (HOSPITAL_BASED_OUTPATIENT_CLINIC_OR_DEPARTMENT_OTHER): Payer: Medicare Other | Admitting: Family

## 2016-10-02 VITALS — BP 127/83 | HR 92 | Temp 98.0°F | Resp 16 | Wt 158.4 lb

## 2016-10-02 DIAGNOSIS — C469 Kaposi's sarcoma, unspecified: Secondary | ICD-10-CM | POA: Diagnosis not present

## 2016-10-02 DIAGNOSIS — Z5111 Encounter for antineoplastic chemotherapy: Secondary | ICD-10-CM | POA: Diagnosis present

## 2016-10-02 DIAGNOSIS — B029 Zoster without complications: Secondary | ICD-10-CM | POA: Diagnosis not present

## 2016-10-02 DIAGNOSIS — Z21 Asymptomatic human immunodeficiency virus [HIV] infection status: Secondary | ICD-10-CM | POA: Diagnosis not present

## 2016-10-02 LAB — CBC WITH DIFFERENTIAL (CANCER CENTER ONLY)
BASO#: 0 10*3/uL (ref 0.0–0.2)
BASO%: 0.2 % (ref 0.0–2.0)
EOS ABS: 0.3 10*3/uL (ref 0.0–0.5)
EOS%: 1.8 % (ref 0.0–7.0)
HCT: 42.2 % (ref 38.7–49.9)
HEMOGLOBIN: 14.7 g/dL (ref 13.0–17.1)
LYMPH#: 2.8 10*3/uL (ref 0.9–3.3)
LYMPH%: 20 % (ref 14.0–48.0)
MCH: 33.8 pg — ABNORMAL HIGH (ref 28.0–33.4)
MCHC: 34.8 g/dL (ref 32.0–35.9)
MCV: 97 fL (ref 82–98)
MONO#: 0.7 10*3/uL (ref 0.1–0.9)
MONO%: 4.9 % (ref 0.0–13.0)
NEUT%: 73.1 % (ref 40.0–80.0)
NEUTROS ABS: 10.4 10*3/uL — AB (ref 1.5–6.5)
Platelets: 256 10*3/uL (ref 145–400)
RBC: 4.35 10*6/uL (ref 4.20–5.70)
RDW: 12.5 % (ref 11.1–15.7)
WBC: 14.2 10*3/uL — AB (ref 4.0–10.0)

## 2016-10-02 LAB — T-HELPER CELLS (CD4) COUNT (NOT AT ARMC)
CD4 % Helper T Cell: 18 % — ABNORMAL LOW (ref 33–55)
CD4 T Cell Abs: 550 /uL (ref 400–2700)

## 2016-10-02 LAB — CMP (CANCER CENTER ONLY)
ALBUMIN: 3.7 g/dL (ref 3.3–5.5)
ALK PHOS: 86 U/L — AB (ref 26–84)
ALT: 21 U/L (ref 10–47)
AST: 20 U/L (ref 11–38)
BILIRUBIN TOTAL: 0.7 mg/dL (ref 0.20–1.60)
BUN, Bld: 11 mg/dL (ref 7–22)
CALCIUM: 8.8 mg/dL (ref 8.0–10.3)
CO2: 24 mEq/L (ref 18–33)
Chloride: 103 mEq/L (ref 98–108)
Creat: 1.4 mg/dl — ABNORMAL HIGH (ref 0.6–1.2)
GLUCOSE: 110 mg/dL (ref 73–118)
POTASSIUM: 3.6 meq/L (ref 3.3–4.7)
Sodium: 139 mEq/L (ref 128–145)
TOTAL PROTEIN: 6.9 g/dL (ref 6.4–8.1)

## 2016-10-02 LAB — LACTATE DEHYDROGENASE: LDH: 254 U/L — AB (ref 125–245)

## 2016-10-02 MED ORDER — DEXTROSE 5 % IV SOLN
Freq: Once | INTRAVENOUS | Status: AC
  Administered 2016-10-02: 10:00:00 via INTRAVENOUS

## 2016-10-02 MED ORDER — ONDANSETRON HCL 4 MG/2ML IJ SOLN
INTRAMUSCULAR | Status: AC
  Filled 2016-10-02: qty 4

## 2016-10-02 MED ORDER — VALACYCLOVIR HCL 1 G PO TABS: 1.0000 g | ORAL_TABLET | ORAL | 1 refills | Status: DC | PRN

## 2016-10-02 MED ORDER — DEXTROSE 5 % IV SOLN
20.0000 mg/m2 | Freq: Once | INTRAVENOUS | Status: AC
  Administered 2016-10-02: 36 mg via INTRAVENOUS
  Filled 2016-10-02: qty 18

## 2016-10-02 MED ORDER — DEXAMETHASONE SODIUM PHOSPHATE 10 MG/ML IJ SOLN
INTRAMUSCULAR | Status: AC
  Filled 2016-10-02: qty 1

## 2016-10-02 MED ORDER — LYRICA 75 MG PO CAPS: 75.0000 mg | ORAL_CAPSULE | ORAL | 1 refills | Status: DC | PRN

## 2016-10-02 MED ORDER — ONDANSETRON HCL 8 MG PO TABS
8.0000 mg | ORAL_TABLET | Freq: Once | ORAL | Status: AC
  Administered 2016-10-02: 8 mg via ORAL

## 2016-10-02 MED ORDER — DEXAMETHASONE SODIUM PHOSPHATE 10 MG/ML IJ SOLN
10.0000 mg | Freq: Once | INTRAMUSCULAR | Status: AC
  Administered 2016-10-02: 10 mg via INTRAVENOUS

## 2016-10-02 MED FILL — LYRICA 75 MG CAPSULE: 75 | 30 days supply | Qty: 30 | Fill #0

## 2016-10-02 NOTE — Progress Notes (Signed)
Hematology and Oncology Follow Up Visit  Todd Vincent [id number] November 18, 1975 41 y.o. 10/02/2016   Principle Diagnosis:  Kaposi's sarcoma - progressive HIV-asymptomatic  Current Therapy:   Doxil - q 4 week dosing    Interim History:  Todd Vincent is here today for follow-up. He states that has shingles on his left buttock. This is itching and there are small vesicles present. The area is a little tender as well.  He needs refills on both Valtrex and Lyrica today.  He has been having vivid dreams and some insomnia. He takes trazodone at night to help him sleep.  His lesions continue to fade. CD4 in February was 310. He follows up with ID Todd Vincent in August.  No fever, chills, n/v, cough, rash, dizziness, SOB, chest pain, palpitations, abdominal pain or changes in bowel or bladder habits.  He states that he had a barium study that showed a very small leak in his fistula so his ostomy reversal is on hold for now until this heals.  He has chronic swelling in his lower extremities and is on Maxzide daily to help reduce this.  No tenderness, numbness or tingling in his extremities. No c/o pain at this time.   ECOG Performance Status: 1 - Symptomatic but completely ambulatory  Medications:  Allergies as of 10/02/2016      Reactions   Sulfa Antibiotics Rash   Extreme itching       Medication List       Accurate as of 10/02/16 10:48 AM. Always use your most recent med list.          acyclovir 800 MG tablet Commonly known as:  ZOVIRAX Take 800 mg by mouth 5 (five) times daily.   ascorbic acid 500 MG tablet Commonly known as:  VITAMIN C Take by mouth.   citalopram 10 MG tablet Commonly known as:  CELEXA TAKE 1 TABLET(10 MG) BY MOUTH DAILY   COCONUT OIL PO Take 1,000 mg by mouth 2 (two) times daily.   dapsone 100 MG tablet Take 1 tablet (100 mg total) by mouth daily.   darunavir 800 MG tablet Commonly known as:  PREZISTA TAKE 1 TABLET BY MOUTH EVERY DAY WITH BREAKFAST   dronabinol 5 MG capsule Commonly known as:  MARINOL TAKE ONE CAPSULE BY MOUTH TWICE A DAY BEFORE LUNCH AND SUPPER   elvitegravir-cobicistat-emtricitabine-tenofovir 150-150-200-10 MG Tabs tablet Commonly known as:  GENVOYA Take 1 tablet by mouth daily.   gabapentin 300 MG capsule Commonly known as:  NEURONTIN TAKE 1 CAPSULE(300 MG) BY MOUTH THREE TIMES DAILY   lidocaine 5 % Commonly known as:  LIDODERM APPLY 1 PATCH TO THE SKIN DAILY. REMOVE AND DISCARD PATCH WITHIN 12 HOURS OR AS DIRECTED BY MD   LORazepam 0.5 MG tablet Commonly known as:  ATIVAN TAKE 1 BY MOUTH EVERY 6 HOURS AS NEEDED   LYRICA 75 MG capsule Generic drug:  pregabalin Take 1 capsule (75 mg total) by mouth as needed.   meclizine 25 MG tablet Commonly known as:  ANTIVERT Take 1 tablet (25 mg total) by mouth every 6 (six) hours as needed for dizziness.   ondansetron 8 MG tablet Commonly known as:  ZOFRAN Take 1 tablet (8 mg total) by mouth 2 (two) times daily. Start the day after chemo for 2 days. Then take as needed for nausea or vomiting.   traZODone 100 MG tablet Commonly known as:  DESYREL Take 1 tablet (100 mg total) by mouth at bedtime.   triamterene-hydrochlorothiazide 37.5-25 MG  tablet Commonly known as:  MAXZIDE-25 Take 1 tablet by mouth daily.   valACYclovir 1000 MG tablet Commonly known as:  VALTREX Take 1 tablet (1,000 mg total) by mouth as needed.   Vitamin D3 2000 units Tabs Take 2,000 Units by mouth every morning. 2 tabs = 4,000 U daily       Allergies:  Allergies  Allergen Reactions  . Sulfa Antibiotics Rash    Extreme itching     Past Medical History, Surgical history, Social history, and Family History were reviewed and updated.  Review of Systems: All other 10 point review of systems is negative.   Physical Exam:  weight is 158 lb 6.4 oz (71.8 kg). His oral temperature is 98 F (36.7 C). His blood pressure is 127/83 and his pulse is 92. His respiration is 16 and oxygen  saturation is 97%.   Wt Readings from Last 3 Encounters:  10/02/16 158 lb 6.4 oz (71.8 kg)  07/31/16 155 lb (70.3 kg)  07/07/16 152 lb (68.9 kg)    Ocular: Sclerae unicteric, pupils equal, round and reactive to light Ear-nose-throat: Oropharynx clear, dentition fair Lymphatic: No cervical, supraclavicular or axillary adenopathy Lungs no rales or rhonchi, good excursion bilaterally Heart regular rate and rhythm, no murmur appreciated Abd soft, nontender, positive bowel sounds, no liver or spleen tip palpated on exam, no fluid wave MSK no focal spinal tenderness, no joint edema Neuro: non-focal, well-oriented, appropriate affect Breasts: Deferred   Lab Results  Component Value Date   WBC 14.2 (H) 10/02/2016   HGB 14.7 10/02/2016   HCT 42.2 10/02/2016   MCV 97 10/02/2016   PLT 256 10/02/2016   No results found for: FERRITIN, IRON, TIBC, UIBC, IRONPCTSAT Lab Results  Component Value Date   RBC 4.35 10/02/2016   No results found for: KPAFRELGTCHN, LAMBDASER, KAPLAMBRATIO No results found for: IGGSERUM, IGA, IGMSERUM No results found for: Todd Vincent, SPEI   Chemistry      Component Value Date/Time   NA 139 10/02/2016 0812   NA 141 12/13/2015 0827   K 3.6 10/02/2016 0812   K 4.0 12/13/2015 0827   CL 103 10/02/2016 0812   CO2 24 10/02/2016 0812   CO2 26 12/13/2015 0827   BUN 11 10/02/2016 0812   BUN 8.2 12/13/2015 0827   CREATININE 1.4 (H) 10/02/2016 0812   CREATININE 1.2 12/13/2015 0827      Component Value Date/Time   CALCIUM 8.8 10/02/2016 0812   CALCIUM 9.2 12/13/2015 0827   ALKPHOS 86 (H) 10/02/2016 0812   ALKPHOS 78 12/13/2015 0827   AST 20 10/02/2016 0812   AST 14 12/13/2015 0827   ALT 21 10/02/2016 0812   ALT 13 12/13/2015 0827   BILITOT 0.70 10/02/2016 0812   BILITOT 0.40 12/13/2015 0827      Impression and Plan: Todd Vincent is a very pleasant 41 yo African American gentleman with Kaposi's sarcoma.  He has responded nicely to Doxil and is followed closely by ID Todd Vincent.  His HIV appears to be controlled. CD4 level for today is pending.  He does have an outbreak of shingles on his left buttock. This is itching and tender. WBC count is 14 at this time. I have put refills his Lyrica and Valtrex.  We will proceed with his Doxil infusion today as planned.  We will plan to see him back in another 6 weeks off repeat labs, follow-up and infusion.  He will contact our office with any questions  or concerns. We can certainly see him sooner if need be.   Eliezer Bottom, NP 5/18/201810:48 AM

## 2016-10-06 ENCOUNTER — Other Ambulatory Visit: Payer: Self-pay | Admitting: Hematology & Oncology

## 2016-10-07 MED ORDER — TRIAMTERENE-HCTZ 37.5-25 MG PO TABS: 1.0000 | ORAL_TABLET | Freq: Every day | ORAL | 0 refills | Status: DC

## 2016-10-07 MED ORDER — MECLIZINE HCL 25 MG PO TABS: 25.0000 mg | ORAL_TABLET | Freq: Four times a day (QID) | ORAL | 0 refills | Status: DC | PRN

## 2016-10-07 MED ORDER — TRAZODONE HCL 100 MG PO TABS: 100.0000 mg | ORAL_TABLET | Freq: Every day | ORAL | 0 refills | Status: DC

## 2016-10-07 MED ORDER — DRONABINOL 5 MG PO CAPS: ORAL_CAPSULE | ORAL | 0 refills | Status: DC

## 2016-10-13 LAB — T-HELPER CELLS (CD4) COUNT (CHCC SATELLITE)

## 2016-10-28 ENCOUNTER — Other Ambulatory Visit: Payer: Self-pay | Admitting: *Deleted

## 2016-10-28 DIAGNOSIS — C469 Kaposi's sarcoma, unspecified: Secondary | ICD-10-CM

## 2016-10-28 DIAGNOSIS — B029 Zoster without complications: Secondary | ICD-10-CM

## 2016-10-28 MED ORDER — VALACYCLOVIR HCL 1 G PO TABS: 1.0000 g | ORAL_TABLET | ORAL | 1 refills | Status: DC | PRN

## 2016-10-30 ENCOUNTER — Other Ambulatory Visit: Payer: Self-pay | Admitting: Hematology & Oncology

## 2016-11-02 MED ORDER — DRONABINOL 5 MG PO CAPS: ORAL_CAPSULE | ORAL | 0 refills | Status: DC

## 2016-11-02 MED ORDER — TRIAMTERENE-HCTZ 37.5-25 MG PO TABS: 1.0000 | ORAL_TABLET | Freq: Every day | ORAL | 0 refills | Status: DC

## 2016-11-02 MED ORDER — MECLIZINE HCL 25 MG PO TABS: 25.0000 mg | ORAL_TABLET | Freq: Four times a day (QID) | ORAL | 0 refills | Status: DC | PRN

## 2016-11-02 MED FILL — LYRICA 75 MG CAPSULE: 75 | 30 days supply | Qty: 30 | Fill #1

## 2016-11-16 ENCOUNTER — Other Ambulatory Visit: Payer: Self-pay | Admitting: Family

## 2016-11-16 DIAGNOSIS — C469 Kaposi's sarcoma, unspecified: Secondary | ICD-10-CM

## 2016-11-20 ENCOUNTER — Ambulatory Visit (HOSPITAL_BASED_OUTPATIENT_CLINIC_OR_DEPARTMENT_OTHER): Payer: Medicare Other | Admitting: Hematology & Oncology

## 2016-11-20 ENCOUNTER — Other Ambulatory Visit (HOSPITAL_COMMUNITY)
Admission: RE | Admit: 2016-11-20 | Discharge: 2016-11-20 | Disposition: A | Discharge status: Home / Self Care | Payer: Medicare Other | Source: Ambulatory Visit | Attending: Hematology & Oncology | Admitting: Hematology & Oncology

## 2016-11-20 ENCOUNTER — Other Ambulatory Visit (HOSPITAL_BASED_OUTPATIENT_CLINIC_OR_DEPARTMENT_OTHER): Payer: Medicare Other

## 2016-11-20 ENCOUNTER — Ambulatory Visit (HOSPITAL_BASED_OUTPATIENT_CLINIC_OR_DEPARTMENT_OTHER): Payer: Medicare Other

## 2016-11-20 VITALS — BP 121/70 | HR 79 | Temp 98.3°F | Resp 16 | Wt 161.0 lb

## 2016-11-20 DIAGNOSIS — B029 Zoster without complications: Secondary | ICD-10-CM

## 2016-11-20 DIAGNOSIS — C469 Kaposi's sarcoma, unspecified: Secondary | ICD-10-CM | POA: Diagnosis not present

## 2016-11-20 DIAGNOSIS — B20 Human immunodeficiency virus [HIV] disease: Secondary | ICD-10-CM | POA: Insufficient documentation

## 2016-11-20 DIAGNOSIS — Z933 Colostomy status: Secondary | ICD-10-CM | POA: Diagnosis not present

## 2016-11-20 DIAGNOSIS — Z5111 Encounter for antineoplastic chemotherapy: Secondary | ICD-10-CM

## 2016-11-20 LAB — CBC WITH DIFFERENTIAL (CANCER CENTER ONLY)
BASO#: 0 10*3/uL (ref 0.0–0.2)
BASO%: 0.2 % (ref 0.0–2.0)
EOS%: 3.4 % (ref 0.0–7.0)
Eosinophils Absolute: 0.3 10*3/uL (ref 0.0–0.5)
HCT: 39.2 % (ref 38.7–49.9)
HGB: 13.1 g/dL (ref 13.0–17.1)
LYMPH#: 2.4 10*3/uL (ref 0.9–3.3)
LYMPH%: 29.5 % (ref 14.0–48.0)
MCH: 33 pg (ref 28.0–33.4)
MCHC: 33.4 g/dL (ref 32.0–35.9)
MCV: 99 fL — ABNORMAL HIGH (ref 82–98)
MONO#: 0.6 10*3/uL (ref 0.1–0.9)
MONO%: 7.6 % (ref 0.0–13.0)
NEUT#: 4.9 10*3/uL (ref 1.5–6.5)
NEUT%: 59.3 % (ref 40.0–80.0)
PLATELETS: 238 10*3/uL (ref 145–400)
RBC: 3.97 10*6/uL — AB (ref 4.20–5.70)
RDW: 14.1 % (ref 11.1–15.7)
WBC: 8.3 10*3/uL (ref 4.0–10.0)

## 2016-11-20 LAB — CMP (CANCER CENTER ONLY)
ALT: 17 U/L (ref 10–47)
AST: 18 U/L (ref 11–38)
Albumin: 3.5 g/dL (ref 3.3–5.5)
Alkaline Phosphatase: 78 U/L (ref 26–84)
BUN: 8 mg/dL (ref 7–22)
CO2: 29 mEq/L (ref 18–33)
CREATININE: 1 mg/dL (ref 0.6–1.2)
Calcium: 9.1 mg/dL (ref 8.0–10.3)
Chloride: 107 mEq/L (ref 98–108)
GLUCOSE: 115 mg/dL (ref 73–118)
Potassium: 3.7 mEq/L (ref 3.3–4.7)
SODIUM: 138 meq/L (ref 128–145)
TOTAL PROTEIN: 6.7 g/dL (ref 6.4–8.1)
Total Bilirubin: 0.7 mg/dl (ref 0.20–1.60)

## 2016-11-20 LAB — T-HELPER CELLS (CD4) COUNT (NOT AT ARMC)
CD4 T CELL HELPER: 16 % — AB (ref 33–55)
CD4 T Cell Abs: 400 /uL (ref 400–2700)

## 2016-11-20 LAB — LACTATE DEHYDROGENASE: LDH: 225 U/L (ref 125–245)

## 2016-11-20 MED ORDER — ONDANSETRON HCL 8 MG PO TABS
ORAL_TABLET | ORAL | Status: AC
  Filled 2016-11-20: qty 1

## 2016-11-20 MED ORDER — DEXTROSE 5 % IV SOLN
Freq: Once | INTRAVENOUS | Status: AC
  Administered 2016-11-20: 10:00:00 via INTRAVENOUS

## 2016-11-20 MED ORDER — DEXAMETHASONE SODIUM PHOSPHATE 10 MG/ML IJ SOLN
INTRAMUSCULAR | Status: AC
  Filled 2016-11-20: qty 1

## 2016-11-20 MED ORDER — ONDANSETRON HCL 8 MG PO TABS
8.0000 mg | ORAL_TABLET | Freq: Once | ORAL | Status: AC
  Administered 2016-11-20: 8 mg via ORAL

## 2016-11-20 MED ORDER — DEXTROSE 5 % IV SOLN
20.0000 mg/m2 | Freq: Once | INTRAVENOUS | Status: AC
  Administered 2016-11-20: 36 mg via INTRAVENOUS
  Filled 2016-11-20: qty 18

## 2016-11-20 MED ORDER — DEXAMETHASONE SODIUM PHOSPHATE 10 MG/ML IJ SOLN
10.0000 mg | Freq: Once | INTRAMUSCULAR | Status: AC
  Administered 2016-11-20: 10 mg via INTRAVENOUS

## 2016-11-20 NOTE — Patient Instructions (Signed)
Doxorubicin Liposomal injection What is this medicine? LIPOSOMAL DOXORUBICIN (LIP oh som al dox oh ROO bi sin) is a chemotherapy drug. This medicine is used to treat many kinds of cancer like Kaposi's sarcoma, multiple myeloma, and ovarian cancer. This medicine may be used for other purposes; ask your health care provider or pharmacist if you have questions. COMMON BRAND NAME(S): Doxil, Lipodox What should I tell my health care provider before I take this medicine? They need to know if you have any of these conditions: -blood disorders -heart disease -infection (especially a virus infection such as chickenpox, cold sores, or herpes) -liver disease -recent or ongoing radiation therapy -an unusual or allergic reaction to doxorubicin, other chemotherapy agents, soybeans, other medicines, foods, dyes, or preservatives -pregnant or trying to get pregnant -breast-feeding How should I use this medicine? This drug is given as an infusion into a vein. It is administered in a hospital or clinic by a specially trained health care professional. If you have pain, swelling, burning or any unusual feeling around the site of your injection, tell your health care professional right away. Talk to your pediatrician regarding the use of this medicine in children. Special care may be needed. Overdosage: If you think you have taken too much of this medicine contact a poison control center or emergency room at once. NOTE: This medicine is only for you. Do not share this medicine with others. What if I miss a dose? It is important not to miss your dose. Call your doctor or health care professional if you are unable to keep an appointment. What may interact with this medicine? Do not take this medicine with any of the following medications: -zidovudine This medicine may also interact with the following medications: -medicines to increase blood counts like filgrastim, pegfilgrastim, sargramostim -vaccines Talk to  your doctor or health care professional before taking any of these medicines: -acetaminophen -aspirin -ibuprofen -ketoprofen -naproxen This list may not describe all possible interactions. Give your health care provider a list of all the medicines, herbs, non-prescription drugs, or dietary supplements you use. Also tell them if you smoke, drink alcohol, or use illegal drugs. Some items may interact with your medicine. What should I watch for while using this medicine? Your condition will be monitored carefully while you are receiving this medicine. You will need important blood work done while you are taking this medicine. This drug may make you feel generally unwell. This is not uncommon, as chemotherapy can affect healthy cells as well as cancer cells. Report any side effects. Continue your course of treatment even though you feel ill unless your doctor tells you to stop. Your urine may turn orange-red for a few days after your dose. This is not blood. If your urine is dark or brown, call your doctor. In some cases, you may be given additional medicines to help with side effects. Follow all directions for their use. Call your doctor or health care professional for advice if you get a fever (100.5 degrees F or higher), chills or sore throat, or other symptoms of a cold or flu. Do not treat yourself. This drug decreases your body's ability to fight infections. Try to avoid being around people who are sick. This medicine may increase your risk to bruise or bleed. Call your doctor or health care professional if you notice any unusual bleeding. Be careful brushing and flossing your teeth or using a toothpick because you may get an infection or bleed more easily. If you have any dental  work done, tell your dentist you are receiving this medicine. Avoid taking products that contain aspirin, acetaminophen, ibuprofen, naproxen, or ketoprofen unless instructed by your doctor. These medicines may hide a  fever. Men and women of childbearing age should use effective birth control methods while using taking this medicine. Do not become pregnant while taking this medicine. There is a potential for serious side effects to an unborn child. Talk to your health care professional or pharmacist for more information. Do not breast-feed an infant while taking this medicine. Talk to your doctor about your risk of cancer. You may be more at risk for certain types of cancers if you take this medicine. What side effects may I notice from receiving this medicine? Side effects that you should report to your doctor or health care professional as soon as possible: -allergic reactions like skin rash, itching or hives, swelling of the face, lips, or tongue -low blood counts - this medicine may decrease the number of white blood cells, red blood cells and platelets. You may be at increased risk for infections and bleeding. -signs of hand-foot syndrome - tingling or burning, redness, flaking, swelling, small blisters, or small sores on the palms of your hands or the soles of your feet -signs of infection - fever or chills, cough, sore throat, pain or difficulty passing urine -signs of decreased platelets or bleeding - bruising, pinpoint red spots on the skin, black, tarry stools, blood in the urine -signs of decreased red blood cells - unusually weak or tired, fainting spells, lightheadedness -back pain, chills, facial flushing, fever, headache, tightness in the chest or throat during the infusion -breathing problems -chest pain -fast, irregular heartbeat -mouth pain, redness, sores -pain, swelling, redness at site where injected -pain, tingling, numbness in the hands or feet -swelling of ankles, feet, or hands -vomiting Side effects that usually do not require medical attention (report to your doctor or health care professional if they continue or are bothersome): -diarrhea -hair loss -loss of appetite -nail  discoloration or damage -nausea -red or watery eyes -red colored urine -stomach upset This list may not describe all possible side effects. Call your doctor for medical advice about side effects. You may report side effects to FDA at 1-800-FDA-1088. Where should I keep my medicine? This drug is given in a hospital or clinic and will not be stored at home. NOTE: This sheet is a summary. It may not cover all possible information. If you have questions about this medicine, talk to your doctor, pharmacist, or health care provider.  2018 [employer]vier/Gold Standard (2012-01-22 10:12:56)  

## 2016-11-20 NOTE — Progress Notes (Signed)
Hematology and Oncology Follow Up Visit  CHAPMAN MATTEUCCI [id number] 08/17/1975 41 y.o. 11/20/2016   Principle Diagnosis:  Kaposi's sarcoma - progressive HIV-asymptomatic  Current Therapy:   Doxil- q 8 week dosing     Interim History:  Mr. Shaddix is here today for a follow-up. He is doing quite well. He is still recovering from the shingles that he got on his left buttock. This happened about 2 months ago. He still has a little bit of tingling. He takes Lyrica whenever he has a flareup of the postherpetic neuralgia.  He has 41th birthday just 2 weeks ago. He was down and landed for this. He had a great time.  He has had no problems with the Kaposi's. His skin looks great. He has not noted any new skin lesions.  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 A Mosher was not been reversed. He sees his Psychologist, sport and exercise down in [place]. Apparently, he said the surgeon is worried about his CD4 count. I would think that his CD4 count should be more than adequate. Also, there apparently is a small tear in the colon. I am not sure what this actually means.  There's been no fever. There's been no rashes. He's had no cough or shortness of breath.  He used to smoke. He is stopped smoking.  He needs to have an echocardiogram done. This will be set up. Medications:  Allergies as of 11/20/2016      Reactions   Sulfa Antibiotics Rash   Extreme itching       Medication List       Accurate as of 11/20/16  9:30 AM. Always use your most recent med list.          acyclovir 800 MG tablet Commonly known as:  ZOVIRAX Take 800 mg by mouth 5 (five) times daily.   ascorbic acid 500 MG tablet Commonly known as:  VITAMIN C Take by mouth.   citalopram 10 MG tablet Commonly known as:  CELEXA TAKE 1 TABLET(10 MG) BY MOUTH DAILY   COCONUT OIL PO Take 1,000 mg by mouth 2 (two) times daily.   dapsone 100 MG tablet Take 1 tablet (100 mg total) by mouth daily.   darunavir 800 MG tablet Commonly known as:  PREZISTA TAKE 1  TABLET BY MOUTH EVERY DAY WITH BREAKFAST   dronabinol 5 MG capsule Commonly known as:  MARINOL TAKE ONE CAPSULE BY MOUTH TWICE A DAY BEFORE LUNCH AND SUPPER   elvitegravir-cobicistat-emtricitabine-tenofovir 150-150-200-10 MG Tabs tablet Commonly known as:  GENVOYA Take 1 tablet by mouth daily.   gabapentin 300 MG capsule Commonly known as:  NEURONTIN TAKE 1 CAPSULE(300 MG) BY MOUTH THREE TIMES DAILY   lidocaine 5 % Commonly known as:  LIDODERM APPLY 1 PATCH TO THE SKIN DAILY. REMOVE AND DISCARD PATCH WITHIN 12 HOURS OR AS DIRECTED BY MD   LORazepam 0.5 MG tablet Commonly known as:  ATIVAN TAKE 1 BY MOUTH EVERY 6 HOURS AS NEEDED   LYRICA 75 MG capsule Generic drug:  pregabalin Take 1 capsule (75 mg total) by mouth as needed.   meclizine 25 MG tablet Commonly known as:  ANTIVERT Take 1 tablet (25 mg total) by mouth every 6 (six) hours as needed for dizziness.   ondansetron 8 MG tablet Commonly known as:  ZOFRAN Take 1 tablet (8 mg total) by mouth 2 (two) times daily. Start the day after chemo for 2 days. Then take as needed for nausea or vomiting.   traZODone 100 MG tablet Commonly known  as:  DESYREL Take 1 tablet (100 mg total) by mouth at bedtime.   triamterene-hydrochlorothiazide 37.5-25 MG tablet Commonly known as:  MAXZIDE-25 Take 1 tablet by mouth daily.   valACYclovir 1000 MG tablet Commonly known as:  VALTREX Take 1 tablet (1,000 mg total) by mouth as needed.   Vitamin D3 2000 units Tabs Take 2,000 Units by mouth every morning. 2 tabs = 4,000 U daily       Allergies:  Allergies  Allergen Reactions  . Sulfa Antibiotics Rash    Extreme itching     Past Medical History, Surgical history, Social history, and Family History were reviewed and updated.  Review of Systems: All other 10 point review of systems is negative.   Physical Exam:  weight is 161 lb (73 kg). His oral temperature is 98.3 F (36.8 C). His blood pressure is 121/70 and his pulse is  79. His respiration is 16 and oxygen saturation is 97%.   Wt Readings from Last 3 Encounters:  11/20/16 161 lb (73 kg)  10/02/16 158 lb 6.4 oz (71.8 kg)  07/31/16 155 lb (70.3 kg)    Well-developed and well-nourished African-American male. Head and neck exam shows no ocular or oral lesions. He has no scleral icterus. There is no adenopathy in the neck. Lungs are clear bilaterally. Cardiac exam regular rate and rhythm with no murmurs, rubs or bruits. Abdomen is soft. Has a colostomy in the right lower quadrant. There is no fluid wave. He has no palpable liver or spleen tip. Externally shows 2+ brawny edema in his lower legs. He has good range of motion of his joints and skin exam shows faint macular areas consistent with the Kaposi's. Neurological exam shows no focal neurological deficits.ropriate affect  Lab Results  Component Value Date   WBC 8.3 11/20/2016   HGB 13.1 11/20/2016   HCT 39.2 11/20/2016   MCV 99 (H) 11/20/2016   PLT 238 11/20/2016   No results found for: FERRITIN, IRON, TIBC, UIBC, IRONPCTSAT Lab Results  Component Value Date   RBC 3.97 (L) 11/20/2016   No results found for: KPAFRELGTCHN, LAMBDASER, KAPLAMBRATIO No results found for: IGGSERUM, IGA, IGMSERUM No results found for: Odetta Pink, SPEI   Chemistry      Component Value Date/Time   NA 138 11/20/2016 0848   NA 141 12/13/2015 0827   K 3.7 11/20/2016 0848   K 4.0 12/13/2015 0827   CL 107 11/20/2016 0848   CO2 29 11/20/2016 0848   CO2 26 12/13/2015 0827   BUN 8 11/20/2016 0848   BUN 8.2 12/13/2015 0827   CREATININE 1.0 11/20/2016 0848   CREATININE 1.2 12/13/2015 0827      Component Value Date/Time   CALCIUM 9.1 11/20/2016 0848   CALCIUM 9.2 12/13/2015 0827   ALKPHOS 78 11/20/2016 0848   ALKPHOS 78 12/13/2015 0827   AST 18 11/20/2016 0848   AST 14 12/13/2015 0827   ALT 17 11/20/2016 0848   ALT 13 12/13/2015 0827   BILITOT 0.70 11/20/2016 0848     BILITOT 0.40 12/13/2015 0827     Impression and Plan: Mr. Milby is 41 yo African American gentleman with Kaposi's sarcoma. He has responded nicely to Doxil and has no complaints at this time.   His HIV continues to be well controlled.He sees his HIV physician I think in May or June.  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 As such, he really needs to try to get this reversed. I really  think that there should be no problem having this reversed. I think his immune status from the HIV is not going to affect healing. We can avoid adjust his chemotherapy protocol according to any surgical date that is scheduled.  He is really done well. I think we can probably go every 2 months now for treatment. Maybe, we might be oh to give him another "vacation" from treatment.  We will see what his echocardiogram shows.  I just am sad that he cannot get this colostomy reversed. I'm not sure what the real problem is. His CD4 count should be more than adequate for infection protection. Hopefully, this will get done in the future.  We will get him back in 2 months.   [name], MD 7/6/20189:30 AM

## 2016-11-23 ENCOUNTER — Other Ambulatory Visit: Payer: Self-pay | Admitting: Hematology & Oncology

## 2016-11-23 DIAGNOSIS — B029 Zoster without complications: Secondary | ICD-10-CM

## 2016-11-23 DIAGNOSIS — C469 Kaposi's sarcoma, unspecified: Secondary | ICD-10-CM

## 2016-11-23 LAB — T-HELPER CELLS (CD4) COUNT (CHCC SATELLITE)

## 2016-11-23 MED ORDER — GABAPENTIN 300 MG PO CAPS: ORAL_CAPSULE | ORAL | 0 refills | Status: DC

## 2016-11-23 MED ORDER — DRONABINOL 5 MG PO CAPS: ORAL_CAPSULE | ORAL | 0 refills | Status: DC

## 2016-11-23 MED ORDER — LYRICA 75 MG PO CAPS: 75.0000 mg | ORAL_CAPSULE | ORAL | 1 refills | Status: DC | PRN

## 2016-11-23 MED ORDER — MECLIZINE HCL 25 MG PO TABS: 25.0000 mg | ORAL_TABLET | Freq: Four times a day (QID) | ORAL | 0 refills | Status: DC | PRN

## 2016-11-23 MED ORDER — TRAZODONE HCL 100 MG PO TABS: 100.0000 mg | ORAL_TABLET | Freq: Every day | ORAL | 0 refills | Status: DC

## 2016-11-24 ENCOUNTER — Other Ambulatory Visit: Payer: Self-pay | Admitting: *Deleted

## 2016-11-24 MED ORDER — TRIAMTERENE-HCTZ 37.5-25 MG PO TABS: 1.0000 | ORAL_TABLET | Freq: Every day | ORAL | 0 refills | Status: DC

## 2016-12-03 ENCOUNTER — Ambulatory Visit (HOSPITAL_BASED_OUTPATIENT_CLINIC_OR_DEPARTMENT_OTHER)
Admission: RE | Admit: 2016-12-03 | Discharge: 2016-12-03 | Disposition: A | Discharge status: Home / Self Care | Payer: Medicare Other | Source: Ambulatory Visit | Attending: Family | Admitting: Family

## 2016-12-03 DIAGNOSIS — B20 Human immunodeficiency virus [HIV] disease: Secondary | ICD-10-CM | POA: Insufficient documentation

## 2016-12-03 DIAGNOSIS — C469 Kaposi's sarcoma, unspecified: Secondary | ICD-10-CM | POA: Diagnosis not present

## 2016-12-03 NOTE — Progress Notes (Signed)
  Echocardiogram 2D Echocardiogram has been performed.  Donata Clay 12/03/2016, 9:06 AM

## 2016-12-21 ENCOUNTER — Other Ambulatory Visit: Payer: Self-pay | Admitting: Internal Medicine

## 2016-12-21 ENCOUNTER — Other Ambulatory Visit: Payer: Medicare Other

## 2016-12-21 DIAGNOSIS — B20 Human immunodeficiency virus [HIV] disease: Secondary | ICD-10-CM | POA: Diagnosis not present

## 2016-12-22 ENCOUNTER — Other Ambulatory Visit: Payer: Medicare Other

## 2016-12-22 LAB — T-HELPER CELL (CD4) - (RCID CLINIC ONLY)
CD4 % Helper T Cell: 15 % — ABNORMAL LOW (ref 33–55)
CD4 T Cell Abs: 380 /uL — ABNORMAL LOW (ref 400–2700)

## 2016-12-23 LAB — HIV-1 RNA QUANT-NO REFLEX-BLD
HIV 1 RNA Quant: 69 copies/mL — ABNORMAL HIGH
HIV-1 RNA Quant, Log: 1.84 Log copies/mL — ABNORMAL HIGH

## 2017-01-05 ENCOUNTER — Ambulatory Visit: Payer: Medicare Other | Admitting: Internal Medicine

## 2017-01-12 ENCOUNTER — Ambulatory Visit (INDEPENDENT_AMBULATORY_CARE_PROVIDER_SITE_OTHER): Payer: Medicare Other | Admitting: Internal Medicine

## 2017-01-12 ENCOUNTER — Encounter: Payer: Self-pay | Admitting: Internal Medicine

## 2017-01-12 VITALS — BP 130/76 | HR 118 | Temp 98.9°F | Ht 67.0 in | Wt 160.0 lb

## 2017-01-12 DIAGNOSIS — Z932 Ileostomy status: Secondary | ICD-10-CM | POA: Diagnosis not present

## 2017-01-12 DIAGNOSIS — B20 Human immunodeficiency virus [HIV] disease: Secondary | ICD-10-CM

## 2017-01-12 DIAGNOSIS — Z113 Encounter for screening for infections with a predominantly sexual mode of transmission: Secondary | ICD-10-CM | POA: Diagnosis not present

## 2017-01-12 NOTE — Assessment & Plan Note (Signed)
Doing well.  rtc 6 months.  

## 2017-01-12 NOTE — Progress Notes (Signed)
   Subjective:    Patient ID: WOODARD PERRELL, male    DOB: 10/10/1975, 41 y.o.   MRN: [id number]  HPI Here for follow up of HIV Taking Genvoya and prezista and denies any missed doses.  Still getting Doxil for Kaposi's sarcoma and now down to every 8 weeks.  No asociated n/v/d.    Review of Systems  Constitutional: Negative for fatigue and fever.  Gastrointestinal: Negative for nausea.  Skin: Negative for rash.       Objective:   Physical Exam  Constitutional: He appears well-developed and well-nourished. No distress.  Cardiovascular: Normal rate, regular rhythm and normal heart sounds.   No murmur heard. Pulmonary/Chest: Effort normal and breath sounds normal. No respiratory distress.  Lymphadenopathy:    He has no cervical adenopathy.  Skin: No rash noted.  Faint areas of Kaposis on arms          Assessment & Plan:

## 2017-01-12 NOTE — Assessment & Plan Note (Signed)
Still waiting for reversal.  Goes back to his surgeon later this year.

## 2017-01-14 DIAGNOSIS — K611 Rectal abscess: Secondary | ICD-10-CM | POA: Diagnosis not present

## 2017-01-15 DIAGNOSIS — C469 Kaposi's sarcoma, unspecified: Secondary | ICD-10-CM | POA: Diagnosis not present

## 2017-01-15 DIAGNOSIS — D72829 Elevated white blood cell count, unspecified: Secondary | ICD-10-CM | POA: Diagnosis not present

## 2017-01-15 DIAGNOSIS — K612 Anorectal abscess: Secondary | ICD-10-CM | POA: Diagnosis not present

## 2017-01-15 DIAGNOSIS — Z87891 Personal history of nicotine dependence: Secondary | ICD-10-CM | POA: Diagnosis not present

## 2017-01-15 DIAGNOSIS — B20 Human immunodeficiency virus [HIV] disease: Secondary | ICD-10-CM | POA: Diagnosis not present

## 2017-01-15 DIAGNOSIS — Z9221 Personal history of antineoplastic chemotherapy: Secondary | ICD-10-CM | POA: Diagnosis not present

## 2017-01-15 DIAGNOSIS — K611 Rectal abscess: Secondary | ICD-10-CM | POA: Diagnosis not present

## 2017-01-19 ENCOUNTER — Telehealth: Payer: Self-pay | Admitting: *Deleted

## 2017-01-19 NOTE — Telephone Encounter (Addendum)
Left message on personal voice mail  ----- Message from Eliezer Bottom, NP sent at 01/19/2017 10:03 AM EDT ----- Regarding: ECHO Looks great!  Sarah  ----- Message ----- From: Buel Ream, Rad Results In Sent: 12/03/2016   1:19 PM To: Eliezer Bottom, NP

## 2017-01-22 ENCOUNTER — Ambulatory Visit: Payer: Medicare Other

## 2017-01-22 ENCOUNTER — Ambulatory Visit: Payer: Medicare Other | Admitting: Hematology & Oncology

## 2017-01-22 ENCOUNTER — Other Ambulatory Visit: Payer: Medicare Other

## 2017-01-25 ENCOUNTER — Other Ambulatory Visit: Payer: Self-pay | Admitting: Hematology & Oncology

## 2017-01-25 MED ORDER — TRIAMTERENE-HCTZ 37.5-25 MG PO TABS
1.0000 | ORAL_TABLET | Freq: Every day | ORAL | 0 refills | Status: DC
Start: 2017-01-25 — End: 2017-02-19

## 2017-01-25 MED ORDER — TRAZODONE HCL 100 MG PO TABS: 100.0000 mg | ORAL_TABLET | Freq: Every day | ORAL | 0 refills | Status: DC

## 2017-01-25 MED ORDER — DRONABINOL 5 MG PO CAPS: ORAL_CAPSULE | ORAL | 0 refills | Status: DC

## 2017-01-25 MED ORDER — MECLIZINE HCL 25 MG PO TABS: 25.0000 mg | ORAL_TABLET | Freq: Four times a day (QID) | ORAL | 0 refills | Status: DC | PRN

## 2017-01-26 ENCOUNTER — Other Ambulatory Visit: Payer: Self-pay | Admitting: *Deleted

## 2017-01-26 DIAGNOSIS — C469 Kaposi's sarcoma, unspecified: Secondary | ICD-10-CM

## 2017-01-26 DIAGNOSIS — B029 Zoster without complications: Secondary | ICD-10-CM

## 2017-01-26 MED ORDER — LYRICA 75 MG PO CAPS: 75.0000 mg | ORAL_CAPSULE | ORAL | 1 refills | Status: DC | PRN

## 2017-01-28 DIAGNOSIS — K603 Anal fistula: Secondary | ICD-10-CM | POA: Diagnosis not present

## 2017-02-11 ENCOUNTER — Encounter: Payer: Self-pay | Admitting: Internal Medicine

## 2017-02-11 ENCOUNTER — Encounter: Payer: Self-pay | Admitting: Hematology & Oncology

## 2017-02-18 ENCOUNTER — Other Ambulatory Visit (HOSPITAL_BASED_OUTPATIENT_CLINIC_OR_DEPARTMENT_OTHER): Payer: Medicare Other

## 2017-02-18 ENCOUNTER — Ambulatory Visit (HOSPITAL_BASED_OUTPATIENT_CLINIC_OR_DEPARTMENT_OTHER): Payer: Medicare Other

## 2017-02-18 ENCOUNTER — Other Ambulatory Visit (HOSPITAL_COMMUNITY)
Admission: RE | Admit: 2017-02-18 | Discharge: 2017-02-18 | Disposition: A | Discharge status: Home / Self Care | Payer: Medicare Other | Source: Ambulatory Visit | Attending: Hematology & Oncology | Admitting: Hematology & Oncology

## 2017-02-18 ENCOUNTER — Other Ambulatory Visit: Payer: Self-pay | Admitting: Hematology & Oncology

## 2017-02-18 ENCOUNTER — Ambulatory Visit (HOSPITAL_BASED_OUTPATIENT_CLINIC_OR_DEPARTMENT_OTHER): Payer: Medicare Other | Admitting: Family

## 2017-02-18 VITALS — BP 140/79 | HR 91 | Temp 98.5°F | Resp 16 | Wt 155.0 lb

## 2017-02-18 DIAGNOSIS — B029 Zoster without complications: Secondary | ICD-10-CM

## 2017-02-18 DIAGNOSIS — Z5111 Encounter for antineoplastic chemotherapy: Secondary | ICD-10-CM

## 2017-02-18 DIAGNOSIS — G4700 Insomnia, unspecified: Secondary | ICD-10-CM

## 2017-02-18 DIAGNOSIS — Z21 Asymptomatic human immunodeficiency virus [HIV] infection status: Secondary | ICD-10-CM

## 2017-02-18 DIAGNOSIS — Z932 Ileostomy status: Secondary | ICD-10-CM | POA: Diagnosis not present

## 2017-02-18 DIAGNOSIS — Z23 Encounter for immunization: Secondary | ICD-10-CM | POA: Diagnosis not present

## 2017-02-18 DIAGNOSIS — C469 Kaposi's sarcoma, unspecified: Secondary | ICD-10-CM

## 2017-02-18 DIAGNOSIS — B20 Human immunodeficiency virus [HIV] disease: Secondary | ICD-10-CM

## 2017-02-18 LAB — CBC WITH DIFFERENTIAL (CANCER CENTER ONLY)
BASO#: 0 10*3/uL (ref 0.0–0.2)
BASO%: 0.2 % (ref 0.0–2.0)
EOS ABS: 0.2 10*3/uL (ref 0.0–0.5)
EOS%: 2.3 % (ref 0.0–7.0)
HEMATOCRIT: 38.2 % — AB (ref 38.7–49.9)
HEMOGLOBIN: 12.9 g/dL — AB (ref 13.0–17.1)
LYMPH#: 3.1 10*3/uL (ref 0.9–3.3)
LYMPH%: 30.5 % (ref 14.0–48.0)
MCH: 32.8 pg (ref 28.0–33.4)
MCHC: 33.8 g/dL (ref 32.0–35.9)
MCV: 97 fL (ref 82–98)
MONO#: 0.8 10*3/uL (ref 0.1–0.9)
MONO%: 7.4 % (ref 0.0–13.0)
NEUT%: 59.6 % (ref 40.0–80.0)
NEUTROS ABS: 6 10*3/uL (ref 1.5–6.5)
Platelets: 300 10*3/uL (ref 145–400)
RBC: 3.93 10*6/uL — ABNORMAL LOW (ref 4.20–5.70)
RDW: 16.2 % — AB (ref 11.1–15.7)
WBC: 10.1 10*3/uL — ABNORMAL HIGH (ref 4.0–10.0)

## 2017-02-18 LAB — CMP (CANCER CENTER ONLY)
ALBUMIN: 3.6 g/dL (ref 3.3–5.5)
ALK PHOS: 81 U/L (ref 26–84)
ALT(SGPT): 21 U/L (ref 10–47)
AST: 17 U/L (ref 11–38)
BUN, Bld: 12 mg/dL (ref 7–22)
CALCIUM: 9.5 mg/dL (ref 8.0–10.3)
CHLORIDE: 106 meq/L (ref 98–108)
CO2: 28 meq/L (ref 18–33)
Creat: 1 mg/dl (ref 0.6–1.2)
GLUCOSE: 149 mg/dL — AB (ref 73–118)
POTASSIUM: 3.7 meq/L (ref 3.3–4.7)
Sodium: 143 mEq/L (ref 128–145)
Total Bilirubin: 0.8 mg/dl (ref 0.20–1.60)
Total Protein: 7.1 g/dL (ref 6.4–8.1)

## 2017-02-18 MED ORDER — INFLUENZA VAC SPLIT QUAD 0.5 ML IM SUSY
0.5000 mL | PREFILLED_SYRINGE | Freq: Once | INTRAMUSCULAR | Status: AC
  Administered 2017-02-18: 0.5 mL via INTRAMUSCULAR

## 2017-02-18 MED ORDER — DEXAMETHASONE SODIUM PHOSPHATE 10 MG/ML IJ SOLN
10.0000 mg | Freq: Once | INTRAMUSCULAR | Status: AC
  Administered 2017-02-18: 10 mg via INTRAVENOUS

## 2017-02-18 MED ORDER — TEMAZEPAM 15 MG PO CAPS: 15.0000 mg | ORAL_CAPSULE | Freq: Every evening | ORAL | 1 refills | Status: DC | PRN

## 2017-02-18 MED ORDER — INFLUENZA VAC SPLIT QUAD 0.5 ML IM SUSY
PREFILLED_SYRINGE | INTRAMUSCULAR | Status: AC
  Filled 2017-02-18: qty 0.5

## 2017-02-18 MED ORDER — LYRICA 75 MG PO CAPS: 75.0000 mg | ORAL_CAPSULE | ORAL | 1 refills | Status: DC | PRN

## 2017-02-18 MED ORDER — DEXTROSE 5 % IV SOLN: Freq: Once | INTRAVENOUS | Status: DC

## 2017-02-18 MED ORDER — ONDANSETRON HCL 8 MG PO TABS
ORAL_TABLET | ORAL | Status: AC
  Filled 2017-02-18: qty 1

## 2017-02-18 MED ORDER — DEXAMETHASONE SODIUM PHOSPHATE 10 MG/ML IJ SOLN
INTRAMUSCULAR | Status: AC
  Filled 2017-02-18: qty 1

## 2017-02-18 MED ORDER — SODIUM CHLORIDE 0.9 % IV SOLN
Freq: Once | INTRAVENOUS | Status: AC
  Administered 2017-02-18: 11:00:00 via INTRAVENOUS

## 2017-02-18 MED ORDER — SODIUM CHLORIDE 0.9 % IJ SOLN
10.0000 mL | INTRAMUSCULAR | Status: DC | PRN
  Filled 2017-02-18: qty 10

## 2017-02-18 MED ORDER — DOXORUBICIN HCL LIPOSOMAL CHEMO INJECTION 2 MG/ML
20.0000 mg/m2 | Freq: Once | INTRAVENOUS | Status: AC
  Administered 2017-02-18: 36 mg via INTRAVENOUS
  Filled 2017-02-18: qty 18

## 2017-02-18 MED ORDER — ONDANSETRON HCL 8 MG PO TABS
8.0000 mg | ORAL_TABLET | Freq: Once | ORAL | Status: AC
  Administered 2017-02-18: 8 mg via ORAL

## 2017-02-18 NOTE — Progress Notes (Signed)
Hematology and Oncology Follow Up Visit  Todd Vincent [id number] [birth year] 41 y.o. 02/18/2017   Principle Diagnosis:  Kaposi's sarcoma - progressive HIV - asymptomatic  Current Therapy:   Doxil - q 8 week dosing    Interim History:  Todd Vincent is here today for follow-up. He is doing well but states that he is not sleeping well despite taking 2 trazedone at bedtime.  His skin looks good, slightly hyperpigmented areas from old lesions continue to fade nicely. No new lesions noted on exam.  No fever, chills, n/v, cough, rash, dizziness, SOB, chest pain, abdominal pain or changes in bowel or bladder habits.  He has had no problems with his ostomy. He is followed by surgery and they are holding off waiting for a small tear in the intestines to finish healing before reversing his ostomy.  The swelling in his lower extremities is much improved. No numbness or tingling in his extremities at this time.  He would like a flu shot today.   ECOG Performance Status: 1 - Symptomatic but completely ambulatory  Medications:  Allergies as of 02/18/2017      Reactions   Sulfa Antibiotics Rash   Extreme itching       Medication List       Accurate as of 02/18/17  9:46 AM. Always use your most recent med list.          acyclovir 800 MG tablet Commonly known as:  ZOVIRAX Take 800 mg by mouth 5 (five) times daily.   citalopram 10 MG tablet Commonly known as:  CELEXA TAKE 1 TABLET(10 MG) BY MOUTH DAILY   COCONUT OIL PO Take 1,000 mg by mouth 2 (two) times daily.   dapsone 100 MG tablet Take 1 tablet (100 mg total) by mouth daily.   darunavir 800 MG tablet Commonly known as:  PREZISTA TAKE 1 TABLET BY MOUTH EVERY DAY WITH BREAKFAST   dronabinol 5 MG capsule Commonly known as:  MARINOL TAKE ONE CAPSULE BY MOUTH TWICE A DAY BEFORE LUNCH AND SUPPER   elvitegravir-cobicistat-emtricitabine-tenofovir 150-150-200-10 MG Tabs tablet Commonly known as:  GENVOYA Take 1 tablet by mouth  daily.   lidocaine 5 % Commonly known as:  LIDODERM APPLY 1 PATCH TO THE SKIN DAILY. REMOVE AND DISCARD PATCH WITHIN 12 HOURS OR AS DIRECTED BY MD   LORazepam 0.5 MG tablet Commonly known as:  ATIVAN TAKE 1 BY MOUTH EVERY 6 HOURS AS NEEDED   LYRICA 75 MG capsule Generic drug:  pregabalin Take 1 capsule (75 mg total) by mouth as needed.   meclizine 25 MG tablet Commonly known as:  ANTIVERT Take 1 tablet (25 mg total) by mouth every 6 (six) hours as needed for dizziness.   ondansetron 8 MG tablet Commonly known as:  ZOFRAN Take 1 tablet (8 mg total) by mouth 2 (two) times daily. Start the day after chemo for 2 days. Then take as needed for nausea or vomiting.   traZODone 100 MG tablet Commonly known as:  DESYREL Take 1 tablet (100 mg total) by mouth at bedtime.   triamterene-hydrochlorothiazide 37.5-25 MG tablet Commonly known as:  MAXZIDE-25 Take 1 tablet by mouth daily.   valACYclovir 1000 MG tablet Commonly known as:  VALTREX Take 1 tablet (1,000 mg total) by mouth as needed.       Allergies:  Allergies  Allergen Reactions  . Sulfa Antibiotics Rash    Extreme itching     Past Medical History, Surgical history, Social history, and Family History were  reviewed and updated.  Review of Systems: All other 10 point review of systems is negative.   Physical Exam:  vitals were not taken for this visit.  Wt Readings from Last 3 Encounters:  01/12/17 160 lb (72.6 kg)  11/20/16 161 lb (73 kg)  10/02/16 158 lb 6.4 oz (71.8 kg)    Ocular: Sclerae unicteric, pupils equal, round and reactive to light Ear-nose-throat: Oropharynx clear, dentition fair Lymphatic: No cervical, supraclavicular or axillary adenopathy Lungs no rales or rhonchi, good excursion bilaterally Heart regular rate and rhythm, no murmur appreciated Abd soft, nontender, positive bowel sounds, no liver or spleen tip palpated on exam, no fluid wave  MSK no focal spinal tenderness, no joint  edema Neuro: non-focal, well-oriented, appropriate affect Breasts: Deferred   Lab Results  Component Value Date   WBC 10.1 (H) 02/18/2017   HGB 12.9 (L) 02/18/2017   HCT 38.2 (L) 02/18/2017   MCV 97 02/18/2017   PLT 300 02/18/2017   No results found for: FERRITIN, IRON, TIBC, UIBC, IRONPCTSAT Lab Results  Component Value Date   RBC 3.93 (L) 02/18/2017   No results found for: KPAFRELGTCHN, LAMBDASER, KAPLAMBRATIO No results found for: IGGSERUM, IGA, IGMSERUM No results found for: Odetta Pink, SPEI   Chemistry      Component Value Date/Time   NA 138 11/20/2016 0848   NA 141 12/13/2015 0827   K 3.7 11/20/2016 0848   K 4.0 12/13/2015 0827   CL 107 11/20/2016 0848   CO2 29 11/20/2016 0848   CO2 26 12/13/2015 0827   BUN 8 11/20/2016 0848   BUN 8.2 12/13/2015 0827   CREATININE 1.0 11/20/2016 0848   CREATININE 1.2 12/13/2015 0827      Component Value Date/Time   CALCIUM 9.1 11/20/2016 0848   CALCIUM 9.2 12/13/2015 0827   ALKPHOS 78 11/20/2016 0848   ALKPHOS 78 12/13/2015 0827   AST 18 11/20/2016 0848   AST 14 12/13/2015 0827   ALT 17 11/20/2016 0848   ALT 13 12/13/2015 0827   BILITOT 0.70 11/20/2016 0848   BILITOT 0.40 12/13/2015 0827      Impression and Plan: Todd Vincent is a very pleasant 41 yo African American gentleman with Kaposi's sarcoma. He has done well on Doxil and his skin has continued to improve.  ID continues to closely follow his HIV.  We will proceed with his Doxil infusion today as planned and he will also get his flu shot.  We will DC his Trazodone and have him try Restoril 15 mg QHS PRN. He will let us know if this is not effective.  We will plan to see him back again in another 2 months for repeat labs, follow-up and infusion.  He will contact our office with any questions or concerns. We can certainly see him sooner if needed.   Eliezer Bottom, NP 10/4/20189:46 AM

## 2017-02-18 NOTE — Telephone Encounter (Signed)
Per sara [place] NP, lyrica 75 mg tabs, # [phone] called to patient's pharmacy

## 2017-02-19 ENCOUNTER — Other Ambulatory Visit: Payer: Self-pay | Admitting: *Deleted

## 2017-02-19 LAB — T-HELPER CELLS (CD4) COUNT (NOT AT ARMC)
CD4 T CELL ABS: 430 /uL (ref 400–2700)
CD4 T CELL HELPER: 14 % — AB (ref 33–55)

## 2017-02-19 MED ORDER — MECLIZINE HCL 25 MG PO TABS: 25.0000 mg | ORAL_TABLET | Freq: Four times a day (QID) | ORAL | 11 refills | Status: DC | PRN

## 2017-02-19 MED ORDER — TRIAMTERENE-HCTZ 37.5-25 MG PO TABS: 1.0000 | ORAL_TABLET | Freq: Every day | ORAL | 11 refills | Status: DC

## 2017-02-19 MED ORDER — DRONABINOL 5 MG PO CAPS: ORAL_CAPSULE | ORAL | 0 refills | Status: DC

## 2017-02-22 LAB — T-HELPER CELLS (CD4) COUNT (CHCC SATELLITE)

## 2017-03-04 DIAGNOSIS — S61215A Laceration without foreign body of left ring finger without damage to nail, initial encounter: Secondary | ICD-10-CM | POA: Diagnosis not present

## 2017-03-19 DIAGNOSIS — Z1211 Encounter for screening for malignant neoplasm of colon: Secondary | ICD-10-CM | POA: Diagnosis not present

## 2017-03-25 ENCOUNTER — Other Ambulatory Visit: Payer: Self-pay | Admitting: Hematology & Oncology

## 2017-03-25 ENCOUNTER — Other Ambulatory Visit: Payer: Self-pay | Admitting: *Deleted

## 2017-03-25 DIAGNOSIS — B029 Zoster without complications: Secondary | ICD-10-CM

## 2017-03-25 DIAGNOSIS — C469 Kaposi's sarcoma, unspecified: Secondary | ICD-10-CM

## 2017-03-25 MED ORDER — LYRICA 75 MG PO CAPS: 75.0000 mg | ORAL_CAPSULE | ORAL | 1 refills | Status: DC | PRN

## 2017-03-25 MED ORDER — DRONABINOL 5 MG PO CAPS: ORAL_CAPSULE | ORAL | 0 refills | Status: DC

## 2017-04-19 ENCOUNTER — Ambulatory Visit (HOSPITAL_BASED_OUTPATIENT_CLINIC_OR_DEPARTMENT_OTHER): Payer: Medicare Other

## 2017-04-19 ENCOUNTER — Other Ambulatory Visit (HOSPITAL_BASED_OUTPATIENT_CLINIC_OR_DEPARTMENT_OTHER): Payer: Medicare Other

## 2017-04-19 ENCOUNTER — Other Ambulatory Visit: Payer: Self-pay | Admitting: Hematology & Oncology

## 2017-04-19 ENCOUNTER — Ambulatory Visit (HOSPITAL_BASED_OUTPATIENT_CLINIC_OR_DEPARTMENT_OTHER): Payer: Medicare Other | Admitting: Family

## 2017-04-19 ENCOUNTER — Other Ambulatory Visit (HOSPITAL_COMMUNITY)
Admission: RE | Admit: 2017-04-19 | Discharge: 2017-04-19 | Disposition: A | Discharge status: Home / Self Care | Payer: Medicare Other | Source: Ambulatory Visit | Attending: Hematology & Oncology | Admitting: Hematology & Oncology

## 2017-04-19 VITALS — BP 128/73 | HR 86 | Temp 98.2°F | Wt 163.1 lb

## 2017-04-19 DIAGNOSIS — Z5111 Encounter for antineoplastic chemotherapy: Secondary | ICD-10-CM

## 2017-04-19 DIAGNOSIS — Z21 Asymptomatic human immunodeficiency virus [HIV] infection status: Secondary | ICD-10-CM | POA: Diagnosis not present

## 2017-04-19 DIAGNOSIS — C469 Kaposi's sarcoma, unspecified: Secondary | ICD-10-CM

## 2017-04-19 DIAGNOSIS — Z932 Ileostomy status: Secondary | ICD-10-CM

## 2017-04-19 LAB — CMP (CANCER CENTER ONLY)
ALK PHOS: 82 U/L (ref 26–84)
ALT: 23 U/L (ref 10–47)
AST: 17 U/L (ref 11–38)
Albumin: 3.8 g/dL (ref 3.3–5.5)
BILIRUBIN TOTAL: 0.7 mg/dL (ref 0.20–1.60)
BUN: 8 mg/dL (ref 7–22)
CO2: 27 mEq/L (ref 18–33)
Calcium: 9.3 mg/dL (ref 8.0–10.3)
Chloride: 105 mEq/L (ref 98–108)
Creat: 1.3 mg/dl — ABNORMAL HIGH (ref 0.6–1.2)
GLUCOSE: 122 mg/dL — AB (ref 73–118)
Potassium: 3.4 mEq/L (ref 3.3–4.7)
SODIUM: 141 meq/L (ref 128–145)
Total Protein: 7.2 g/dL (ref 6.4–8.1)

## 2017-04-19 LAB — CBC WITH DIFFERENTIAL (CANCER CENTER ONLY)
BASO#: 0 10*3/uL (ref 0.0–0.2)
BASO%: 0.3 % (ref 0.0–2.0)
EOS%: 2.3 % (ref 0.0–7.0)
Eosinophils Absolute: 0.2 10*3/uL (ref 0.0–0.5)
HCT: 43.2 % (ref 38.7–49.9)
HGB: 14.9 g/dL (ref 13.0–17.1)
LYMPH#: 3 10*3/uL (ref 0.9–3.3)
LYMPH%: 29.4 % (ref 14.0–48.0)
MCH: 32.8 pg (ref 28.0–33.4)
MCHC: 34.5 g/dL (ref 32.0–35.9)
MCV: 95 fL (ref 82–98)
MONO#: 0.8 10*3/uL (ref 0.1–0.9)
MONO%: 8 % (ref 0.0–13.0)
NEUT#: 6.1 10*3/uL (ref 1.5–6.5)
NEUT%: 60 % (ref 40.0–80.0)
PLATELETS: 289 10*3/uL (ref 145–400)
RBC: 4.54 10*6/uL (ref 4.20–5.70)
RDW: 13.9 % (ref 11.1–15.7)
WBC: 10.2 10*3/uL — ABNORMAL HIGH (ref 4.0–10.0)

## 2017-04-19 MED ORDER — ONDANSETRON HCL 8 MG PO TABS
ORAL_TABLET | ORAL | Status: AC
  Filled 2017-04-19: qty 1

## 2017-04-19 MED ORDER — DRONABINOL 5 MG PO CAPS: ORAL_CAPSULE | ORAL | 0 refills | Status: DC

## 2017-04-19 MED ORDER — ONDANSETRON HCL 8 MG PO TABS
8.0000 mg | ORAL_TABLET | Freq: Once | ORAL | Status: AC
  Administered 2017-04-19: 8 mg via ORAL

## 2017-04-19 MED ORDER — DEXAMETHASONE SODIUM PHOSPHATE 10 MG/ML IJ SOLN
INTRAMUSCULAR | Status: AC
  Filled 2017-04-19: qty 1

## 2017-04-19 MED ORDER — DOXORUBICIN HCL LIPOSOMAL CHEMO INJECTION 2 MG/ML
20.0000 mg/m2 | Freq: Once | INTRAVENOUS | Status: AC
  Administered 2017-04-19: 36 mg via INTRAVENOUS
  Filled 2017-04-19: qty 18

## 2017-04-19 MED ORDER — DEXTROSE 5 % IV SOLN
Freq: Once | INTRAVENOUS | Status: AC
  Administered 2017-04-19: 12:00:00 via INTRAVENOUS

## 2017-04-19 MED ORDER — DEXAMETHASONE SODIUM PHOSPHATE 10 MG/ML IJ SOLN
10.0000 mg | Freq: Once | INTRAMUSCULAR | Status: AC
  Administered 2017-04-19: 10 mg via INTRAVENOUS

## 2017-04-19 NOTE — Patient Instructions (Signed)
[facility] Discharge Instructions for Patients Receiving Chemotherapy  Today you received the following chemotherapy agents:  Doxil   To help prevent nausea and vomiting after your treatment, we encourage you to take your nausea medication as ordered per MD.    If you develop nausea and vomiting that is not controlled by your nausea medication, call the clinic.   BELOW ARE SYMPTOMS THAT SHOULD BE REPORTED IMMEDIATELY:  *FEVER GREATER THAN 100.5 F  *CHILLS WITH OR WITHOUT FEVER  NAUSEA AND VOMITING THAT IS NOT CONTROLLED WITH YOUR NAUSEA MEDICATION  *UNUSUAL SHORTNESS OF BREATH  *UNUSUAL BRUISING OR BLEEDING  TENDERNESS IN MOUTH AND THROAT WITH OR WITHOUT PRESENCE OF ULCERS  *URINARY PROBLEMS  *BOWEL PROBLEMS  UNUSUAL RASH Items with * indicate a potential emergency and should be followed up as soon as possible.  Feel free to call the clinic should you have any questions or concerns. The clinic phone number is [phone].  Please show the [facility] at check-in to the Emergency Department and triage nurse.

## 2017-04-19 NOTE — Progress Notes (Signed)
Hematology and Oncology Follow Up Visit  Todd Vincent [id number] [birth year] 41 y.o. 04/19/2017   Principle Diagnosis:  Kaposi's sarcoma - progressive HIV - asymptomatic  Current Therapy:   Doxil - q 8week dosing    Interim History:  Todd Vincent is here today for follow-up and treatment. He is doing well and getting over a bout with the shingles. He is taking his Valtrex and coconut oil pill daily. The Lyrica helps with his nerve pain.  He has had no other issues. He is enjoying the [place] season with his family and had a wonderful Thanksgiving.  He is sleeping well now on Restoril and feels much better.  He denies fever, chills, n/v, cough, rash, dizziness, SOB, chest pain, palpitations, abdominal pain or changes in bowel or bladder habits. Ostomy is functioning appropriately.  No episodes of bleeding, bruising or petechiae. No lymphadenopathy found on exam.  No new lesions. His hyperpigmented patches on the arms and legs continue to fade.  The chronic swelling in his lower extremities is stable. Numbness and tingling in his hands and feet is unchanged.  He has maintained a good appetite and is staying well hydrated. His weight is stable.   ECOG Performance Status: 1 - Symptomatic but completely ambulatory  Medications:  Allergies as of 04/19/2017      Reactions   Sulfa Antibiotics Rash   Extreme itching       Medication List        Accurate as of 04/19/17 10:51 AM. Always use your most recent med list.          acyclovir 800 MG tablet Commonly known as:  ZOVIRAX Take 800 mg by mouth 5 (five) times daily.   citalopram 10 MG tablet Commonly known as:  CELEXA TAKE 1 TABLET(10 MG) BY MOUTH DAILY   COCONUT OIL PO Take 1,000 mg by mouth 2 (two) times daily.   dapsone 100 MG tablet Take 1 tablet (100 mg total) by mouth daily.   darunavir 800 MG tablet Commonly known as:  PREZISTA TAKE 1 TABLET BY MOUTH EVERY DAY WITH BREAKFAST   dronabinol 5 MG capsule Commonly  known as:  MARINOL TAKE ONE CAPSULE BY MOUTH TWICE A DAY BEFORE LUNCH AND SUPPER   elvitegravir-cobicistat-emtricitabine-tenofovir 150-150-200-10 MG Tabs tablet Commonly known as:  GENVOYA Take 1 tablet by mouth daily.   LORazepam 0.5 MG tablet Commonly known as:  ATIVAN TAKE 1 BY MOUTH EVERY 6 HOURS AS NEEDED   LYRICA 75 MG capsule Generic drug:  pregabalin Take 1 capsule (75 mg total) as needed by mouth.   meclizine 25 MG tablet Commonly known as:  ANTIVERT Take 1 tablet (25 mg total) by mouth every 6 (six) hours as needed for dizziness.   ondansetron 8 MG tablet Commonly known as:  ZOFRAN Take 1 tablet (8 mg total) by mouth 2 (two) times daily. Start the day after chemo for 2 days. Then take as needed for nausea or vomiting.   temazepam 15 MG capsule Commonly known as:  RESTORIL Take 1 capsule (15 mg total) by mouth at bedtime as needed for sleep.   triamterene-hydrochlorothiazide 37.5-25 MG tablet Commonly known as:  MAXZIDE-25 Take 1 tablet by mouth daily.   valACYclovir 1000 MG tablet Commonly known as:  VALTREX Take 1 tablet (1,000 mg total) by mouth as needed.       Allergies:  Allergies  Allergen Reactions  . Sulfa Antibiotics Rash    Extreme itching     Past Medical History, Surgical  history, Social history, and Family History were reviewed and updated.  Review of Systems: All other 10 point review of systems is negative.   Physical Exam:  weight is 163 lb 1 oz (74 kg). His oral temperature is 98.2 F (36.8 C). His blood pressure is 128/73 and his pulse is 86.   Wt Readings from Last 3 Encounters:  04/19/17 163 lb 1 oz (74 kg)  02/18/17 155 lb (70.3 kg)  01/12/17 160 lb (72.6 kg)    Ocular: Sclerae unicteric, pupils equal, round and reactive to light Ear-nose-throat: Oropharynx clear, dentition fair Lymphatic: No cervical, supraclavicular or axillary adenopathy Lungs no rales or rhonchi, good excursion bilaterally Heart regular rate and  rhythm, no murmur appreciated Abd soft, nontender, positive bowel sounds, no liver or spleen tip palpated on exam, no fluid wave  MSK no focal spinal tenderness, no joint edema Neuro: non-focal, well-oriented, appropriate affect Breasts: Deferred   Lab Results  Component Value Date   WBC 10.2 (H) 04/19/2017   HGB 14.9 04/19/2017   HCT 43.2 04/19/2017   MCV 95 04/19/2017   PLT 289 04/19/2017   No results found for: FERRITIN, IRON, TIBC, UIBC, IRONPCTSAT Lab Results  Component Value Date   RBC 4.54 04/19/2017   No results found for: KPAFRELGTCHN, LAMBDASER, KAPLAMBRATIO No results found for: IGGSERUM, IGA, IGMSERUM No results found for: Odetta Pink, SPEI   Chemistry      Component Value Date/Time   NA 141 04/19/2017 1007   NA 141 12/13/2015 0827   K 3.4 04/19/2017 1007   K 4.0 12/13/2015 0827   CL 105 04/19/2017 1007   CO2 27 04/19/2017 1007   CO2 26 12/13/2015 0827   BUN 8 04/19/2017 1007   BUN 8.2 12/13/2015 0827   CREATININE 1.3 (H) 04/19/2017 1007   CREATININE 1.2 12/13/2015 0827      Component Value Date/Time   CALCIUM 9.3 04/19/2017 1007   CALCIUM 9.2 12/13/2015 0827   ALKPHOS 82 04/19/2017 1007   ALKPHOS 78 12/13/2015 0827   AST 17 04/19/2017 1007   AST 14 12/13/2015 0827   ALT 23 04/19/2017 1007   ALT 13 12/13/2015 0827   BILITOT 0.70 04/19/2017 1007   BILITOT 0.40 12/13/2015 0827      Impression and Plan: Todd Vincent is a very pleasant 41 yo African American gentleman with Kaposi's sarcoma. He continues to respond nicely to Doxil. No new rash or lesions noted on exam.  His HIV is monitored and managed by Dr. Linus Salmons with ID.  We will proceed with treatment today as planned.  We will plan to see him back in another 2 months for follow-up, lab and infusion.  He will contact our office with any questions or concerns. We can certainly see him sooner if need be.   Todd Bottom, NP 12/3/201810:51  AM

## 2017-04-20 LAB — T-HELPER CELLS (CD4) COUNT (CHCC SATELLITE)

## 2017-04-20 LAB — T-HELPER CELLS (CD4) COUNT (NOT AT ARMC)
CD4 T CELL ABS: 460 /uL (ref 400–2700)
CD4 T CELL HELPER: 15 % — AB (ref 33–55)

## 2017-04-22 ENCOUNTER — Other Ambulatory Visit: Payer: Self-pay | Admitting: Internal Medicine

## 2017-04-22 ENCOUNTER — Other Ambulatory Visit: Payer: Self-pay | Admitting: *Deleted

## 2017-04-22 DIAGNOSIS — B20 Human immunodeficiency virus [HIV] disease: Secondary | ICD-10-CM

## 2017-04-22 MED ORDER — DARUNAVIR ETHANOLATE 800 MG PO TABS: ORAL_TABLET | ORAL | 1 refills | Status: DC

## 2017-05-19 ENCOUNTER — Other Ambulatory Visit: Payer: Self-pay | Admitting: Hematology & Oncology

## 2017-05-23 ENCOUNTER — Other Ambulatory Visit: Payer: Self-pay | Admitting: Hematology & Oncology

## 2017-05-24 ENCOUNTER — Other Ambulatory Visit: Payer: Self-pay | Admitting: *Deleted

## 2017-05-24 MED ORDER — DRONABINOL 5 MG PO CAPS: ORAL_CAPSULE | ORAL | 0 refills | Status: DC

## 2017-05-24 MED ORDER — MECLIZINE HCL 25 MG PO TABS: 25.0000 mg | ORAL_TABLET | Freq: Four times a day (QID) | ORAL | 11 refills | Status: AC | PRN

## 2017-06-16 ENCOUNTER — Other Ambulatory Visit: Payer: Self-pay | Admitting: Hematology & Oncology

## 2017-06-16 DIAGNOSIS — C469 Kaposi's sarcoma, unspecified: Secondary | ICD-10-CM

## 2017-06-16 DIAGNOSIS — B029 Zoster without complications: Secondary | ICD-10-CM

## 2017-06-17 ENCOUNTER — Other Ambulatory Visit: Payer: Self-pay | Admitting: Family

## 2017-06-17 DIAGNOSIS — I427 Cardiomyopathy due to drug and external agent: Secondary | ICD-10-CM

## 2017-06-17 DIAGNOSIS — C469 Kaposi's sarcoma, unspecified: Secondary | ICD-10-CM

## 2017-06-21 ENCOUNTER — Encounter: Payer: Self-pay | Admitting: Family

## 2017-06-21 ENCOUNTER — Inpatient Hospital Stay: Payer: Medicare Other

## 2017-06-21 ENCOUNTER — Ambulatory Visit (HOSPITAL_BASED_OUTPATIENT_CLINIC_OR_DEPARTMENT_OTHER)
Admission: RE | Admit: 2017-06-21 | Discharge: 2017-06-21 | Disposition: A | Discharge status: Home / Self Care | Payer: Medicare Other | Source: Ambulatory Visit | Attending: Family | Admitting: Family

## 2017-06-21 ENCOUNTER — Other Ambulatory Visit: Payer: Self-pay

## 2017-06-21 ENCOUNTER — Inpatient Hospital Stay: Payer: Medicare Other | Attending: Family | Admitting: Family

## 2017-06-21 ENCOUNTER — Other Ambulatory Visit: Payer: Self-pay | Admitting: Family

## 2017-06-21 VITALS — BP 141/82 | HR 98 | Temp 98.3°F | Resp 16 | Wt 170.0 lb

## 2017-06-21 DIAGNOSIS — C469 Kaposi's sarcoma, unspecified: Secondary | ICD-10-CM | POA: Insufficient documentation

## 2017-06-21 DIAGNOSIS — Z5111 Encounter for antineoplastic chemotherapy: Secondary | ICD-10-CM | POA: Diagnosis not present

## 2017-06-21 DIAGNOSIS — I429 Cardiomyopathy, unspecified: Secondary | ICD-10-CM | POA: Insufficient documentation

## 2017-06-21 DIAGNOSIS — G629 Polyneuropathy, unspecified: Secondary | ICD-10-CM | POA: Diagnosis not present

## 2017-06-21 DIAGNOSIS — I427 Cardiomyopathy due to drug and external agent: Secondary | ICD-10-CM | POA: Insufficient documentation

## 2017-06-21 DIAGNOSIS — M7989 Other specified soft tissue disorders: Secondary | ICD-10-CM | POA: Diagnosis not present

## 2017-06-21 DIAGNOSIS — B029 Zoster without complications: Secondary | ICD-10-CM

## 2017-06-21 DIAGNOSIS — G4700 Insomnia, unspecified: Secondary | ICD-10-CM

## 2017-06-21 LAB — CBC WITH DIFFERENTIAL (CANCER CENTER ONLY)
BASOS ABS: 0 10*3/uL (ref 0.0–0.1)
Basophils Relative: 0 %
Eosinophils Absolute: 0.3 10*3/uL (ref 0.0–0.5)
Eosinophils Relative: 4 %
HEMATOCRIT: 38.7 % (ref 38.7–49.9)
HEMOGLOBIN: 13.1 g/dL (ref 13.0–17.1)
LYMPHS PCT: 32 %
Lymphs Abs: 2.8 10*3/uL (ref 0.9–3.3)
MCH: 33.2 pg (ref 28.0–33.4)
MCHC: 33.9 g/dL (ref 32.0–35.9)
MCV: 98.2 fL — AB (ref 82.0–98.0)
MONO ABS: 0.9 10*3/uL (ref 0.1–0.9)
Monocytes Relative: 10 %
NEUTROS ABS: 4.7 10*3/uL (ref 1.5–6.5)
NEUTROS PCT: 54 %
Platelet Count: 264 10*3/uL (ref 145–400)
RBC: 3.94 MIL/uL — ABNORMAL LOW (ref 4.20–5.70)
RDW: 13.7 % (ref 11.1–15.7)
WBC Count: 8.7 10*3/uL (ref 4.0–10.0)

## 2017-06-21 LAB — CMP (CANCER CENTER ONLY)
ALK PHOS: 86 U/L — AB (ref 26–84)
ALT: 16 U/L (ref 0–55)
AST: 18 U/L (ref 5–34)
Albumin: 3.4 g/dL — ABNORMAL LOW (ref 3.5–5.0)
Anion gap: 5 (ref 5–15)
BILIRUBIN TOTAL: 0.6 mg/dL (ref 0.2–1.2)
BUN: 12 mg/dL (ref 7–22)
CALCIUM: 8.8 mg/dL (ref 8.0–10.3)
CO2: 27 mmol/L (ref 18–33)
CREATININE: 1 mg/dL (ref 0.70–1.30)
Chloride: 107 mmol/L (ref 98–108)
Glucose, Bld: 133 mg/dL — ABNORMAL HIGH (ref 73–118)
Potassium: 3.4 mmol/L (ref 3.3–4.7)
Sodium: 139 mmol/L (ref 128–145)
TOTAL PROTEIN: 6.8 g/dL (ref 6.4–8.1)

## 2017-06-21 LAB — ECHOCARDIOGRAM COMPLETE: WEIGHTICAEL: 2720 [oz_av]

## 2017-06-21 MED ORDER — TEMAZEPAM 30 MG PO CAPS: 30.0000 mg | ORAL_CAPSULE | Freq: Every evening | ORAL | 2 refills | Status: DC | PRN

## 2017-06-21 MED ORDER — DEXAMETHASONE SODIUM PHOSPHATE 10 MG/ML IJ SOLN
INTRAMUSCULAR | Status: AC
  Filled 2017-06-21: qty 1

## 2017-06-21 MED ORDER — ONDANSETRON HCL 8 MG PO TABS
8.0000 mg | ORAL_TABLET | Freq: Once | ORAL | Status: AC
  Administered 2017-06-21: 8 mg via ORAL

## 2017-06-21 MED ORDER — ONDANSETRON HCL 8 MG PO TABS
ORAL_TABLET | ORAL | Status: AC
  Filled 2017-06-21: qty 1

## 2017-06-21 MED ORDER — DEXAMETHASONE SODIUM PHOSPHATE 10 MG/ML IJ SOLN
10.0000 mg | Freq: Once | INTRAMUSCULAR | Status: AC
  Administered 2017-06-21: 10 mg via INTRAVENOUS

## 2017-06-21 MED ORDER — DEXTROSE 5 % IV SOLN
Freq: Once | INTRAVENOUS | Status: AC
  Administered 2017-06-21: 12:00:00 via INTRAVENOUS

## 2017-06-21 MED ORDER — DEXTROSE 5 % IV SOLN
20.0000 mg/m2 | Freq: Once | INTRAVENOUS | Status: AC
  Administered 2017-06-21: 36 mg via INTRAVENOUS
  Filled 2017-06-21: qty 18

## 2017-06-21 NOTE — Progress Notes (Signed)
Echocardiogram 2D Echocardiogram has been performed.  Todd Vincent 06/21/2017, 12:30 PM

## 2017-06-21 NOTE — Patient Instructions (Signed)
Doxorubicin Liposomal injection What is this medicine? LIPOSOMAL DOXORUBICIN (LIP oh som al dox oh ROO bi sin) is a chemotherapy drug. This medicine is used to treat many kinds of cancer like Kaposi's sarcoma, multiple myeloma, and ovarian cancer. This medicine may be used for other purposes; ask your health care provider or pharmacist if you have questions. COMMON BRAND NAME(S): Doxil, Lipodox What should I tell my health care provider before I take this medicine? They need to know if you have any of these conditions: -blood disorders -heart disease -infection (especially a virus infection such as chickenpox, cold sores, or herpes) -liver disease -recent or ongoing radiation therapy -an unusual or allergic reaction to doxorubicin, other chemotherapy agents, soybeans, other medicines, foods, dyes, or preservatives -pregnant or trying to get pregnant -breast-feeding How should I use this medicine? This drug is given as an infusion into a vein. It is administered in a hospital or clinic by a specially trained health care professional. If you have pain, swelling, burning or any unusual feeling around the site of your injection, tell your health care professional right away. Talk to your pediatrician regarding the use of this medicine in children. Special care may be needed. Overdosage: If you think you have taken too much of this medicine contact a poison control center or emergency room at once. NOTE: This medicine is only for you. Do not share this medicine with others. What if I miss a dose? It is important not to miss your dose. Call your doctor or health care professional if you are unable to keep an appointment. What may interact with this medicine? Do not take this medicine with any of the following medications: -zidovudine This medicine may also interact with the following medications: -medicines to increase blood counts like filgrastim, pegfilgrastim, sargramostim -vaccines Talk to  your doctor or health care professional before taking any of these medicines: -acetaminophen -aspirin -ibuprofen -ketoprofen -naproxen This list may not describe all possible interactions. Give your health care provider a list of all the medicines, herbs, non-prescription drugs, or dietary supplements you use. Also tell them if you smoke, drink alcohol, or use illegal drugs. Some items may interact with your medicine. What should I watch for while using this medicine? Your condition will be monitored carefully while you are receiving this medicine. You will need important blood work done while you are taking this medicine. This drug may make you feel generally unwell. This is not uncommon, as chemotherapy can affect healthy cells as well as cancer cells. Report any side effects. Continue your course of treatment even though you feel ill unless your doctor tells you to stop. Your urine may turn orange-red for a few days after your dose. This is not blood. If your urine is dark or brown, call your doctor. In some cases, you may be given additional medicines to help with side effects. Follow all directions for their use. Call your doctor or health care professional for advice if you get a fever (100.5 degrees F or higher), chills or sore throat, or other symptoms of a cold or flu. Do not treat yourself. This drug decreases your body's ability to fight infections. Try to avoid being around people who are sick. This medicine may increase your risk to bruise or bleed. Call your doctor or health care professional if you notice any unusual bleeding. Be careful brushing and flossing your teeth or using a toothpick because you may get an infection or bleed more easily. If you have any dental  work done, tell your dentist you are receiving this medicine. Avoid taking products that contain aspirin, acetaminophen, ibuprofen, naproxen, or ketoprofen unless instructed by your doctor. These medicines may hide a  fever. Men and women of childbearing age should use effective birth control methods while using taking this medicine. Do not become pregnant while taking this medicine. There is a potential for serious side effects to an unborn child. Talk to your health care professional or pharmacist for more information. Do not breast-feed an infant while taking this medicine. Talk to your doctor about your risk of cancer. You may be more at risk for certain types of cancers if you take this medicine. What side effects may I notice from receiving this medicine? Side effects that you should report to your doctor or health care professional as soon as possible: -allergic reactions like skin rash, itching or hives, swelling of the face, lips, or tongue -low blood counts - this medicine may decrease the number of white blood cells, red blood cells and platelets. You may be at increased risk for infections and bleeding. -signs of hand-foot syndrome - tingling or burning, redness, flaking, swelling, small blisters, or small sores on the palms of your hands or the soles of your feet -signs of infection - fever or chills, cough, sore throat, pain or difficulty passing urine -signs of decreased platelets or bleeding - bruising, pinpoint red spots on the skin, black, tarry stools, blood in the urine -signs of decreased red blood cells - unusually weak or tired, fainting spells, lightheadedness -back pain, chills, facial flushing, fever, headache, tightness in the chest or throat during the infusion -breathing problems -chest pain -fast, irregular heartbeat -mouth pain, redness, sores -pain, swelling, redness at site where injected -pain, tingling, numbness in the hands or feet -swelling of ankles, feet, or hands -vomiting Side effects that usually do not require medical attention (report to your doctor or health care professional if they continue or are bothersome): -diarrhea -hair loss -loss of appetite -nail  discoloration or damage -nausea -red or watery eyes -red colored urine -stomach upset This list may not describe all possible side effects. Call your doctor for medical advice about side effects. You may report side effects to FDA at 1-800-FDA-1088. Where should I keep my medicine? This drug is given in a hospital or clinic and will not be stored at home. NOTE: This sheet is a summary. It may not cover all possible information. If you have questions about this medicine, talk to your doctor, pharmacist, or health care provider.  2018 [employer]vier/Gold Standard (2012-01-22 10:12:56)  

## 2017-06-21 NOTE — Progress Notes (Addendum)
Hematology and Oncology Follow Up Visit  Todd Vincent [id number] [birth year] 42 y.o. 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 He is doing well but still has shingles on his left and right buttock. He is treating this with an organic salve and Valtrex. He feels it is improving.  No fever, chills, n/v, cough, dizziness, SOB, chest pain, palpitations, abdominal pain or changes in bowel or bladder habits.  His ostomy is functioning appropriately. He sees his surgeon later this week on Thursday for routine follow-up.  He is having trouble sleeping despite taking Restoril 15 mg PO QHS.  He sees ID Dr. Linus Vincent later this month on 2/26.  No episodes of bleeding, bruising or petechiae. No lymphadenopathy found on exam.  The swelling in his lower extremities waxes and wanes. He takes his Maxzide as prescribed to help reduce the edema. Neuropathy in his feet is unchanged but has been a little mor prominent in his hands.  He has maintained a good appetite on the Marinol and his weight is stable. He is staying well hydrated.   ECOG Performance Status: 1 - Symptomatic but completely ambulatory  Medications:  Allergies as of 06/21/2017      Reactions   Sulfa Antibiotics Rash   Extreme itching       Medication List        Accurate as of 06/21/17  9:58 AM. Always use your most recent med list.          acyclovir 800 MG tablet Commonly known as:  ZOVIRAX Take 800 mg by mouth 5 (five) times daily.   citalopram 10 MG tablet Commonly known as:  CELEXA TAKE 1 TABLET(10 MG) BY MOUTH DAILY   COCONUT OIL PO Take 1,000 mg by mouth 2 (two) times daily.   dapsone 100 MG tablet Take 1 tablet (100 mg total) by mouth daily.   darunavir 800 MG tablet Commonly known as:  PREZISTA TAKE 1 TABLET BY MOUTH EVERY DAY WITH BREAKFAST     dronabinol 5 MG capsule Commonly known as:  MARINOL TAKE 1 CAPSULE BY MOUTH TWICE DAILY BEFORE LUNCH AND SUPPER   GENVOYA 150-150-200-10 MG Tabs tablet Generic drug:  elvitegravir-cobicistat-emtricitabine-tenofovir TAKE 1 TABLET BY MOUTH EVERY DAY   LORazepam 0.5 MG tablet Commonly known as:  ATIVAN TAKE 1 BY MOUTH EVERY 6 HOURS AS NEEDED   LYRICA 75 MG capsule Generic drug:  pregabalin TAKE 1 CAPSULE [place] MOUTH AS NEEDED   meclizine 25 MG tablet Commonly known as:  ANTIVERT Take 1 tablet (25 mg total) by mouth every 6 (six) hours as needed for dizziness.   ondansetron 8 MG tablet Commonly known as:  ZOFRAN Take 1 tablet (8 mg total) by mouth 2 (two) times daily. Start the day after chemo for 2 days. Then take as needed for nausea or vomiting.   temazepam 15 MG capsule Commonly known as:  RESTORIL Take 1 capsule (15 mg total) by mouth at bedtime as needed for sleep.   triamterene-hydrochlorothiazide 37.5-25 MG tablet Commonly known as:  MAXZIDE-25 Take 1 tablet by mouth daily.   valACYclovir 1000 MG tablet Commonly known as:  VALTREX Take 1 tablet (1,000 mg total) by mouth as needed.       Allergies:  Allergies  Allergen Reactions  . Sulfa Antibiotics Rash    Extreme itching  Past Medical History, Surgical history, Social history, and Family History were reviewed and updated.  Review of Systems: All other 10 point review of systems is negative.   Physical Exam:  vitals were not taken for this visit.   Wt Readings from Last 3 Encounters:  04/19/17 163 lb 1 oz (74 kg)  02/18/17 155 lb (70.3 kg)  01/12/17 160 lb (72.6 kg)    Ocular: Sclerae unicteric, pupils equal, round and reactive to light Ear-nose-throat: Oropharynx clear, dentition fair Lymphatic: No cervical, supraclavicular or axillary adenopathy Lungs no rales or rhonchi, good excursion bilaterally Heart regular rate and rhythm, no murmur appreciated Abd soft, nontender, positive bowel sounds,  no liver or spleen tip palpated on exam, no fluid wave  MSK no focal spinal tenderness, no joint edema Neuro: non-focal, well-oriented, appropriate affect Breasts: Deferred   Lab Results  Component Value Date   WBC 8.7 06/21/2017   HGB 14.9 04/19/2017   HCT 38.7 06/21/2017   MCV 98.2 (H) 06/21/2017   PLT 264 06/21/2017   No results found for: FERRITIN, IRON, TIBC, UIBC, IRONPCTSAT Lab Results  Component Value Date   RBC 3.94 (L) 06/21/2017   No results found for: KPAFRELGTCHN, LAMBDASER, KAPLAMBRATIO No results found for: IGGSERUM, IGA, IGMSERUM No results found for: Todd Vincent, SPEI   Chemistry      Component Value Date/Time   NA 141 04/19/2017 1007   NA 141 12/13/2015 0827   K 3.4 04/19/2017 1007   K 4.0 12/13/2015 0827   CL 105 04/19/2017 1007   CO2 27 04/19/2017 1007   CO2 26 12/13/2015 0827   BUN 8 04/19/2017 1007   BUN 8.2 12/13/2015 0827   CREATININE 1.3 (H) 04/19/2017 1007   CREATININE 1.2 12/13/2015 0827      Component Value Date/Time   CALCIUM 9.3 04/19/2017 1007   CALCIUM 9.2 12/13/2015 0827   ALKPHOS 82 04/19/2017 1007   ALKPHOS 78 12/13/2015 0827   AST 17 04/19/2017 1007   AST 14 12/13/2015 0827   ALT 23 04/19/2017 1007   ALT 13 12/13/2015 0827   BILITOT 0.70 04/19/2017 1007   BILITOT 0.40 12/13/2015 0827      Impression and Plan: Todd Vincent is a very pleasant 42 yo African American gentleman with Kaposi's sarcoma. He is doing well on Doxil. No new skin lesions noted on his exam and hyperpigmented areas continue to fade.  He is still treating his shingles which is slowly improving.  We will proceed with treatment today as planned.  He will have his ECHO today as well.  Restoril was increased to 30 mg PO QHS PRN. We will plan to see him back in another 8 weeks for follow-up, lab and infusion.  He will contact our office with any questions or concerns. We can certainly see him sooner if need be.     Todd Peace, NP 2/4/20199:58 AM

## 2017-06-21 NOTE — Addendum Note (Signed)
Addended by: Burney Gauze R on: 06/21/2017 12:27 PM   Modules accepted: Orders

## 2017-06-22 ENCOUNTER — Other Ambulatory Visit (HOSPITAL_COMMUNITY)
Admission: RE | Admit: 2017-06-22 | Discharge: 2017-06-22 | Disposition: A | Discharge status: Home / Self Care | Payer: Medicare Other | Source: Ambulatory Visit | Attending: Internal Medicine | Admitting: Internal Medicine

## 2017-06-22 ENCOUNTER — Other Ambulatory Visit: Payer: Medicare Other

## 2017-06-22 DIAGNOSIS — B20 Human immunodeficiency virus [HIV] disease: Secondary | ICD-10-CM

## 2017-06-22 DIAGNOSIS — Z79899 Other long term (current) drug therapy: Secondary | ICD-10-CM | POA: Diagnosis present

## 2017-06-22 DIAGNOSIS — Z113 Encounter for screening for infections with a predominantly sexual mode of transmission: Secondary | ICD-10-CM | POA: Diagnosis present

## 2017-06-22 LAB — T-HELPER CELLS (CD4) COUNT (NOT AT ARMC)
CD4 T CELL HELPER: 14 % — AB (ref 33–55)
CD4 T Cell Abs: 450 /uL (ref 400–2700)

## 2017-06-22 NOTE — Addendum Note (Signed)
Addended byMeriel Pica F on: 06/22/2017 08:27 AM   Modules accepted: Orders

## 2017-06-22 NOTE — Addendum Note (Signed)
Addended byMarcellina Millin on: 06/22/2017 08:38 AM   Modules accepted: Orders

## 2017-06-23 LAB — LIPID PANEL
CHOLESTEROL: 183 mg/dL (ref <200)
HDL: 78 mg/dL (ref >40)
LDL Cholesterol (Calc): 87 mg/dL (calc)
Non-HDL Cholesterol (Calc): 105 mg/dL (calc) (ref <130)
Total CHOL/HDL Ratio: 2.3 (calc) (ref <5.0)
Triglycerides: 87 mg/dL (ref <150)

## 2017-06-23 LAB — RPR: RPR: NONREACTIVE

## 2017-06-23 LAB — T-HELPER CELL (CD4) - (RCID CLINIC ONLY)
CD4 T CELL HELPER: 8 % — AB (ref 33–55)
CD4 T Cell Abs: 160 /uL — ABNORMAL LOW (ref 400–2700)

## 2017-06-23 LAB — URINE CYTOLOGY ANCILLARY ONLY
Chlamydia: NEGATIVE
Neisseria Gonorrhea: NEGATIVE

## 2017-06-24 DIAGNOSIS — K603 Anal fistula: Secondary | ICD-10-CM | POA: Diagnosis not present

## 2017-06-24 DIAGNOSIS — A630 Anogenital (venereal) warts: Secondary | ICD-10-CM | POA: Diagnosis not present

## 2017-06-24 DIAGNOSIS — Z432 Encounter for attention to ileostomy: Secondary | ICD-10-CM | POA: Diagnosis not present

## 2017-06-24 LAB — HIV-1 RNA QUANT-NO REFLEX-BLD
HIV 1 RNA Quant: <20 copies/mL — AB
HIV-1 RNA QUANT, LOG: DETECTED {Log_copies}/mL — AB

## 2017-06-29 ENCOUNTER — Other Ambulatory Visit: Payer: Medicare Other

## 2017-07-02 ENCOUNTER — Encounter: Payer: Self-pay | Admitting: Internal Medicine

## 2017-07-09 ENCOUNTER — Other Ambulatory Visit: Payer: Self-pay | Admitting: Hematology & Oncology

## 2017-07-09 DIAGNOSIS — B029 Zoster without complications: Secondary | ICD-10-CM

## 2017-07-09 DIAGNOSIS — C469 Kaposi's sarcoma, unspecified: Secondary | ICD-10-CM

## 2017-07-09 MED ORDER — PREGABALIN 75 MG PO CAPS: 75.0000 mg | ORAL_CAPSULE | Freq: Every day | ORAL | 4 refills | Status: DC

## 2017-07-13 ENCOUNTER — Ambulatory Visit (INDEPENDENT_AMBULATORY_CARE_PROVIDER_SITE_OTHER): Payer: Medicare Other | Admitting: Internal Medicine

## 2017-07-13 ENCOUNTER — Encounter: Payer: Self-pay | Admitting: Internal Medicine

## 2017-07-13 VITALS — BP 135/84 | HR 103 | Temp 98.2°F | Ht 67.0 in | Wt 166.5 lb

## 2017-07-13 DIAGNOSIS — Z9189 Other specified personal risk factors, not elsewhere classified: Secondary | ICD-10-CM | POA: Diagnosis not present

## 2017-07-13 DIAGNOSIS — C469 Kaposi's sarcoma, unspecified: Secondary | ICD-10-CM | POA: Diagnosis not present

## 2017-07-13 DIAGNOSIS — Z932 Ileostomy status: Secondary | ICD-10-CM

## 2017-07-13 DIAGNOSIS — B20 Human immunodeficiency virus [HIV] disease: Secondary | ICD-10-CM

## 2017-07-13 DIAGNOSIS — F329 Major depressive disorder, single episode, unspecified: Secondary | ICD-10-CM

## 2017-07-13 DIAGNOSIS — F32A Depression, unspecified: Secondary | ICD-10-CM

## 2017-07-13 NOTE — Assessment & Plan Note (Signed)
Continuing on Doxil every 8 weeks.

## 2017-07-13 NOTE — Assessment & Plan Note (Signed)
Considering permanent ostomy placement.

## 2017-07-13 NOTE — Progress Notes (Signed)
   Subjective:    Patient ID: Todd Vincent, male    DOB: Aug 09, 1975, 42 y.o.   MRN: [id number]  HPI Here for follow up of HIV Taking Genvoya and prezista and denies any missed doses.  CD4 160 on Doxil, viral load < 20.  Continues on Doxil for Kaposi's sarcoma and now down to every 8 weeks.  No asociated n/v/d.  Some recent mood swings and depression, no SI.  Going to look into counseling in [place].  Considering definitive surgical treatment with his surgeon in [place].      Review of Systems  Constitutional: Negative for fatigue and fever.  Gastrointestinal: Negative for nausea.  Skin: Negative for rash.       Objective:   Physical Exam  Constitutional: He appears well-developed and well-nourished. No distress.  Cardiovascular: Normal rate, regular rhythm and normal heart sounds.  No murmur heard. Pulmonary/Chest: Effort normal and breath sounds normal. No respiratory distress.  Lymphadenopathy:    He has no cervical adenopathy.  Skin: No rash noted.  Faint areas of Kaposis on arms   SH: former smoker, quit one year ago       Assessment & Plan:

## 2017-07-13 NOTE — Assessment & Plan Note (Signed)
Doing well and continue with same regimen.

## 2017-07-13 NOTE — Assessment & Plan Note (Signed)
He is continuing dapsone for PJP prophylaxis

## 2017-07-13 NOTE — Assessment & Plan Note (Signed)
No concerning issues expressed and no SI.  He is going to look for local counseling in [place].

## 2017-07-16 ENCOUNTER — Telehealth: Payer: Self-pay | Admitting: *Deleted

## 2017-07-16 ENCOUNTER — Other Ambulatory Visit: Payer: Self-pay

## 2017-07-16 DIAGNOSIS — B20 Human immunodeficiency virus [HIV] disease: Secondary | ICD-10-CM

## 2017-07-16 MED ORDER — DAPSONE 100 MG PO TABS: 100.0000 mg | ORAL_TABLET | Freq: Every day | ORAL | 2 refills | Status: DC

## 2017-07-16 NOTE — Telephone Encounter (Signed)
Patient wants to cancel his April appointments. He states he was recently diagnosed with rectal cancer and is scheduled to have his rectum/anus removed on 09/03/17. He cannot have chemo right before surgery.   He also needs some forms for disability to be completed.  Laverna Peace NP given forms to fill out for patient and updated on patient status.  Our office fax number given to patient so he can request that medical records be sent to this office regarding new diagnosis.

## 2017-07-20 ENCOUNTER — Other Ambulatory Visit: Payer: Self-pay | Admitting: Family

## 2017-08-16 ENCOUNTER — Ambulatory Visit: Payer: Medicare Other

## 2017-08-16 ENCOUNTER — Other Ambulatory Visit: Payer: Medicare Other

## 2017-08-16 ENCOUNTER — Ambulatory Visit: Payer: Medicare Other | Admitting: Family

## 2017-08-20 DIAGNOSIS — N4289 Other specified disorders of prostate: Secondary | ICD-10-CM | POA: Diagnosis not present

## 2017-08-20 DIAGNOSIS — K9419 Other complications of enterostomy: Secondary | ICD-10-CM | POA: Diagnosis not present

## 2017-08-20 DIAGNOSIS — Z79899 Other long term (current) drug therapy: Secondary | ICD-10-CM | POA: Diagnosis not present

## 2017-08-20 DIAGNOSIS — A630 Anogenital (venereal) warts: Secondary | ICD-10-CM | POA: Diagnosis not present

## 2017-08-20 DIAGNOSIS — C211 Malignant neoplasm of anal canal: Secondary | ICD-10-CM | POA: Diagnosis not present

## 2017-08-20 DIAGNOSIS — C210 Malignant neoplasm of anus, unspecified: Secondary | ICD-10-CM | POA: Diagnosis not present

## 2017-08-20 DIAGNOSIS — K769 Liver disease, unspecified: Secondary | ICD-10-CM | POA: Diagnosis not present

## 2017-08-20 DIAGNOSIS — K603 Anal fistula: Secondary | ICD-10-CM | POA: Diagnosis not present

## 2017-08-20 DIAGNOSIS — K6131 Horseshoe abscess: Secondary | ICD-10-CM | POA: Diagnosis present

## 2017-08-20 DIAGNOSIS — C469 Kaposi's sarcoma, unspecified: Secondary | ICD-10-CM | POA: Diagnosis present

## 2017-08-20 DIAGNOSIS — Z87891 Personal history of nicotine dependence: Secondary | ICD-10-CM | POA: Diagnosis not present

## 2017-08-20 DIAGNOSIS — Z932 Ileostomy status: Secondary | ICD-10-CM | POA: Diagnosis not present

## 2017-08-20 DIAGNOSIS — Z21 Asymptomatic human immunodeficiency virus [HIV] infection status: Secondary | ICD-10-CM | POA: Diagnosis present

## 2017-09-06 DIAGNOSIS — R1084 Generalized abdominal pain: Secondary | ICD-10-CM | POA: Diagnosis not present

## 2017-09-07 DIAGNOSIS — R109 Unspecified abdominal pain: Secondary | ICD-10-CM | POA: Diagnosis present

## 2017-09-07 DIAGNOSIS — T8140XA Infection following a procedure, unspecified, initial encounter: Secondary | ICD-10-CM | POA: Diagnosis not present

## 2017-09-07 DIAGNOSIS — T8131XA Disruption of external operation (surgical) wound, not elsewhere classified, initial encounter: Secondary | ICD-10-CM | POA: Diagnosis not present

## 2017-09-07 DIAGNOSIS — Z933 Colostomy status: Secondary | ICD-10-CM | POA: Diagnosis not present

## 2017-09-07 DIAGNOSIS — C210 Malignant neoplasm of anus, unspecified: Secondary | ICD-10-CM | POA: Diagnosis not present

## 2017-09-07 DIAGNOSIS — B20 Human immunodeficiency virus [HIV] disease: Secondary | ICD-10-CM | POA: Diagnosis not present

## 2017-09-07 DIAGNOSIS — E860 Dehydration: Secondary | ICD-10-CM | POA: Diagnosis not present

## 2017-09-07 DIAGNOSIS — Z8589 Personal history of malignant neoplasm of other organs and systems: Secondary | ICD-10-CM | POA: Diagnosis not present

## 2017-09-07 DIAGNOSIS — Z85048 Personal history of other malignant neoplasm of rectum, rectosigmoid junction, and anus: Secondary | ICD-10-CM | POA: Diagnosis not present

## 2017-09-07 DIAGNOSIS — C469 Kaposi's sarcoma, unspecified: Secondary | ICD-10-CM | POA: Diagnosis not present

## 2017-09-07 DIAGNOSIS — K567 Ileus, unspecified: Secondary | ICD-10-CM | POA: Diagnosis present

## 2017-09-07 DIAGNOSIS — I81 Portal vein thrombosis: Secondary | ICD-10-CM | POA: Diagnosis not present

## 2017-09-07 DIAGNOSIS — K598 Other specified functional intestinal disorders: Secondary | ICD-10-CM | POA: Diagnosis not present

## 2017-09-07 DIAGNOSIS — T819XXA Unspecified complication of procedure, initial encounter: Secondary | ICD-10-CM | POA: Diagnosis not present

## 2017-09-17 DIAGNOSIS — K603 Anal fistula: Secondary | ICD-10-CM | POA: Diagnosis not present

## 2017-09-19 ENCOUNTER — Other Ambulatory Visit: Payer: Self-pay | Admitting: Hematology & Oncology

## 2017-09-26 ENCOUNTER — Other Ambulatory Visit: Payer: Self-pay | Admitting: Hematology & Oncology

## 2017-09-26 DIAGNOSIS — C469 Kaposi's sarcoma, unspecified: Secondary | ICD-10-CM

## 2017-09-26 DIAGNOSIS — B029 Zoster without complications: Secondary | ICD-10-CM

## 2017-09-30 DIAGNOSIS — C210 Malignant neoplasm of anus, unspecified: Secondary | ICD-10-CM | POA: Diagnosis not present

## 2017-10-13 ENCOUNTER — Other Ambulatory Visit: Payer: Self-pay | Admitting: Hematology & Oncology

## 2017-10-13 DIAGNOSIS — C469 Kaposi's sarcoma, unspecified: Secondary | ICD-10-CM

## 2017-10-13 DIAGNOSIS — B029 Zoster without complications: Secondary | ICD-10-CM

## 2017-10-18 ENCOUNTER — Encounter: Payer: Self-pay | Admitting: Family

## 2017-10-19 ENCOUNTER — Other Ambulatory Visit: Payer: Self-pay | Admitting: Family

## 2017-10-19 DIAGNOSIS — C469 Kaposi's sarcoma, unspecified: Secondary | ICD-10-CM

## 2017-10-19 DIAGNOSIS — I427 Cardiomyopathy due to drug and external agent: Secondary | ICD-10-CM

## 2017-11-05 DIAGNOSIS — C20 Malignant neoplasm of rectum: Secondary | ICD-10-CM | POA: Diagnosis not present

## 2017-11-22 ENCOUNTER — Other Ambulatory Visit: Payer: Self-pay | Admitting: Hematology & Oncology

## 2017-11-22 DIAGNOSIS — R61 Generalized hyperhidrosis: Secondary | ICD-10-CM | POA: Diagnosis not present

## 2017-11-22 DIAGNOSIS — Z932 Ileostomy status: Secondary | ICD-10-CM | POA: Diagnosis not present

## 2017-11-22 DIAGNOSIS — B029 Zoster without complications: Secondary | ICD-10-CM

## 2017-11-22 DIAGNOSIS — Z9049 Acquired absence of other specified parts of digestive tract: Secondary | ICD-10-CM | POA: Diagnosis not present

## 2017-11-22 DIAGNOSIS — C469 Kaposi's sarcoma, unspecified: Secondary | ICD-10-CM

## 2017-11-22 DIAGNOSIS — D72829 Elevated white blood cell count, unspecified: Secondary | ICD-10-CM | POA: Diagnosis not present

## 2017-11-22 DIAGNOSIS — Z48815 Encounter for surgical aftercare following surgery on the digestive system: Secondary | ICD-10-CM | POA: Diagnosis not present

## 2017-11-22 DIAGNOSIS — R109 Unspecified abdominal pain: Secondary | ICD-10-CM | POA: Diagnosis not present

## 2017-11-26 DIAGNOSIS — R112 Nausea with vomiting, unspecified: Secondary | ICD-10-CM | POA: Diagnosis not present

## 2017-11-26 DIAGNOSIS — R197 Diarrhea, unspecified: Secondary | ICD-10-CM | POA: Diagnosis not present

## 2017-12-24 ENCOUNTER — Other Ambulatory Visit: Payer: Medicare Other

## 2017-12-24 DIAGNOSIS — B20 Human immunodeficiency virus [HIV] disease: Secondary | ICD-10-CM

## 2017-12-24 DIAGNOSIS — Z21 Asymptomatic human immunodeficiency virus [HIV] infection status: Secondary | ICD-10-CM

## 2017-12-24 LAB — T-HELPER CELL (CD4) - (RCID CLINIC ONLY)
CD4 % Helper T Cell: 20 % — ABNORMAL LOW (ref 33–55)
CD4 T Cell Abs: 590 /uL (ref 400–2700)

## 2017-12-27 ENCOUNTER — Other Ambulatory Visit: Payer: Medicare Other

## 2017-12-27 DIAGNOSIS — C210 Malignant neoplasm of anus, unspecified: Secondary | ICD-10-CM | POA: Diagnosis not present

## 2017-12-27 LAB — HIV-1 RNA QUANT-NO REFLEX-BLD
HIV 1 RNA Quant: <20 copies/mL
HIV-1 RNA Quant, Log: <1.3 Log copies/mL

## 2017-12-30 ENCOUNTER — Other Ambulatory Visit: Payer: Self-pay | Admitting: Hematology & Oncology

## 2017-12-30 DIAGNOSIS — C469 Kaposi's sarcoma, unspecified: Secondary | ICD-10-CM

## 2017-12-30 DIAGNOSIS — B029 Zoster without complications: Secondary | ICD-10-CM

## 2017-12-30 MED ORDER — PREGABALIN 75 MG PO CAPS: 75.0000 mg | ORAL_CAPSULE | Freq: Every day | ORAL | 3 refills | Status: DC

## 2018-01-10 ENCOUNTER — Encounter: Payer: Self-pay | Admitting: *Deleted

## 2018-01-10 ENCOUNTER — Ambulatory Visit (HOSPITAL_BASED_OUTPATIENT_CLINIC_OR_DEPARTMENT_OTHER)
Admission: RE | Admit: 2018-01-10 | Discharge: 2018-01-10 | Disposition: A | Discharge status: Home / Self Care | Payer: Medicare Other | Source: Ambulatory Visit | Attending: Family | Admitting: Family

## 2018-01-10 ENCOUNTER — Encounter: Payer: Self-pay | Admitting: Internal Medicine

## 2018-01-10 ENCOUNTER — Ambulatory Visit (INDEPENDENT_AMBULATORY_CARE_PROVIDER_SITE_OTHER): Payer: Medicare Other | Admitting: Internal Medicine

## 2018-01-10 VITALS — BP 104/74 | HR 88 | Temp 98.3°F | Ht 67.0 in | Wt 158.8 lb

## 2018-01-10 DIAGNOSIS — I427 Cardiomyopathy due to drug and external agent: Secondary | ICD-10-CM | POA: Diagnosis not present

## 2018-01-10 DIAGNOSIS — Z113 Encounter for screening for infections with a predominantly sexual mode of transmission: Secondary | ICD-10-CM | POA: Diagnosis not present

## 2018-01-10 DIAGNOSIS — Z21 Asymptomatic human immunodeficiency virus [HIV] infection status: Secondary | ICD-10-CM

## 2018-01-10 DIAGNOSIS — Z23 Encounter for immunization: Secondary | ICD-10-CM | POA: Diagnosis not present

## 2018-01-10 DIAGNOSIS — C469 Kaposi's sarcoma, unspecified: Secondary | ICD-10-CM

## 2018-01-10 LAB — ECHOCARDIOGRAM COMPLETE
HEIGHTINCHES: 67 in
WEIGHTICAEL: 2540.8 [oz_av]

## 2018-01-10 NOTE — Progress Notes (Signed)
   Subjective:    Patient ID: Todd Vincent, male    DOB: [birth year], 42 y.o.   MRN: [id number]  HPI Here for follow up of HIV Taking Genvoya and prezista and denies any missed doses.  CD4 now 590, viral load < 20.  Has been off of Doxil for Kaposi's sarcoma due to recent treatment for rectal cancer.  Recovering well after surgery. No asociated n/v/d.   No associated n/v.      Review of Systems  Constitutional: Negative for fatigue and fever.  Gastrointestinal: Negative for nausea.  Skin: Negative for rash.       Objective:   Physical Exam  Constitutional: He appears well-developed and well-nourished. No distress.  Cardiovascular: Normal rate, regular rhythm and normal heart sounds.  No murmur heard. Pulmonary/Chest: Effort normal and breath sounds normal. No respiratory distress.  Lymphadenopathy:    He has no cervical adenopathy.  Skin: No rash noted.   SH: former smoker       Assessment & Plan:

## 2018-01-10 NOTE — Progress Notes (Signed)
  Echocardiogram 2D Echocardiogram has been performed.  Todd Vincent Todd Vincent Todd Vincent 01/10/2018, 1:20 PM

## 2018-01-10 NOTE — Assessment & Plan Note (Signed)
Counseled on prevnar and given today 

## 2018-01-10 NOTE — Assessment & Plan Note (Signed)
Doing well with this.  No issues. rtc 6 months.

## 2018-01-10 NOTE — Assessment & Plan Note (Signed)
Plan for restarting Doxil if echo ok per oncology

## 2018-01-11 ENCOUNTER — Other Ambulatory Visit: Payer: Self-pay

## 2018-01-11 ENCOUNTER — Other Ambulatory Visit: Payer: Self-pay | Admitting: Internal Medicine

## 2018-01-11 ENCOUNTER — Other Ambulatory Visit: Payer: Self-pay | Admitting: Hematology & Oncology

## 2018-01-11 DIAGNOSIS — B20 Human immunodeficiency virus [HIV] disease: Secondary | ICD-10-CM

## 2018-01-11 DIAGNOSIS — B029 Zoster without complications: Secondary | ICD-10-CM

## 2018-01-11 DIAGNOSIS — C469 Kaposi's sarcoma, unspecified: Secondary | ICD-10-CM

## 2018-01-11 MED ORDER — ELVITEG-COBIC-EMTRICIT-TENOFAF 150-150-200-10 MG PO TABS: 1.0000 | ORAL_TABLET | Freq: Every day | ORAL | 0 refills | Status: DC

## 2018-01-11 MED ORDER — DARUNAVIR ETHANOLATE 800 MG PO TABS: ORAL_TABLET | ORAL | 0 refills | Status: DC

## 2018-01-24 ENCOUNTER — Encounter: Payer: Self-pay | Admitting: Family

## 2018-01-24 ENCOUNTER — Inpatient Hospital Stay (HOSPITAL_BASED_OUTPATIENT_CLINIC_OR_DEPARTMENT_OTHER): Payer: Medicare Other | Admitting: Family

## 2018-01-24 ENCOUNTER — Inpatient Hospital Stay: Payer: Medicare Other | Attending: Hematology & Oncology

## 2018-01-24 ENCOUNTER — Other Ambulatory Visit: Payer: Self-pay

## 2018-01-24 ENCOUNTER — Inpatient Hospital Stay: Payer: Medicare Other

## 2018-01-24 VITALS — BP 121/81 | HR 75 | Temp 98.3°F | Resp 16 | Wt 156.0 lb

## 2018-01-24 DIAGNOSIS — Z5111 Encounter for antineoplastic chemotherapy: Secondary | ICD-10-CM | POA: Insufficient documentation

## 2018-01-24 DIAGNOSIS — C20 Malignant neoplasm of rectum: Secondary | ICD-10-CM | POA: Diagnosis not present

## 2018-01-24 DIAGNOSIS — G4700 Insomnia, unspecified: Secondary | ICD-10-CM

## 2018-01-24 DIAGNOSIS — C469 Kaposi's sarcoma, unspecified: Secondary | ICD-10-CM

## 2018-01-24 LAB — CMP (CANCER CENTER ONLY)
ALK PHOS: 78 U/L (ref 26–84)
ALT: 20 U/L (ref 10–47)
ANION GAP: 0 — AB (ref 5–15)
AST: 20 U/L (ref 11–38)
Albumin: 3.4 g/dL — ABNORMAL LOW (ref 3.5–5.0)
BUN: 11 mg/dL (ref 7–22)
CALCIUM: 8.9 mg/dL (ref 8.0–10.3)
CO2: 27 mmol/L (ref 18–33)
Chloride: 111 mmol/L — ABNORMAL HIGH (ref 98–108)
Creatinine: 1.1 mg/dL (ref 0.60–1.20)
Glucose, Bld: 113 mg/dL (ref 73–118)
POTASSIUM: 4.1 mmol/L (ref 3.3–4.7)
Sodium: 136 mmol/L (ref 128–145)
TOTAL PROTEIN: 7 g/dL (ref 6.4–8.1)
Total Bilirubin: 0.4 mg/dL (ref 0.2–1.6)

## 2018-01-24 LAB — CBC WITH DIFFERENTIAL (CANCER CENTER ONLY)
Basophils Absolute: 0.1 10*3/uL (ref 0.0–0.1)
Basophils Relative: 1 %
EOS PCT: 4 %
Eosinophils Absolute: 0.3 10*3/uL (ref 0.0–0.5)
HEMATOCRIT: 42.8 % (ref 38.7–49.9)
HEMOGLOBIN: 14.6 g/dL (ref 13.0–17.1)
LYMPHS ABS: 3 10*3/uL (ref 0.9–3.3)
Lymphocytes Relative: 38 %
MCH: 31.5 pg (ref 28.0–33.4)
MCHC: 34.1 g/dL (ref 32.0–35.9)
MCV: 92.4 fL (ref 82.0–98.0)
Monocytes Absolute: 0.7 10*3/uL (ref 0.1–0.9)
Monocytes Relative: 9 %
NEUTROS PCT: 48 %
Neutro Abs: 3.7 10*3/uL (ref 1.5–6.5)
Platelet Count: 315 10*3/uL (ref 145–400)
RBC: 4.63 MIL/uL (ref 4.20–5.70)
RDW: 13 % (ref 11.1–15.7)
WBC: 7.8 10*3/uL (ref 4.0–10.0)

## 2018-01-24 MED ORDER — DOXORUBICIN HCL LIPOSOMAL CHEMO INJECTION 2 MG/ML
20.0000 mg/m2 | Freq: Once | INTRAVENOUS | Status: AC
  Administered 2018-01-24: 36 mg via INTRAVENOUS
  Filled 2018-01-24: qty 18

## 2018-01-24 MED ORDER — DEXAMETHASONE SODIUM PHOSPHATE 10 MG/ML IJ SOLN
INTRAMUSCULAR | Status: AC
  Filled 2018-01-24: qty 1

## 2018-01-24 MED ORDER — ONDANSETRON HCL 8 MG PO TABS
8.0000 mg | ORAL_TABLET | Freq: Once | ORAL | Status: AC
  Administered 2018-01-24: 8 mg via ORAL

## 2018-01-24 MED ORDER — ONDANSETRON HCL 8 MG PO TABS
ORAL_TABLET | ORAL | Status: AC
  Filled 2018-01-24: qty 1

## 2018-01-24 MED ORDER — TEMAZEPAM 30 MG PO CAPS: 30.0000 mg | ORAL_CAPSULE | Freq: Every evening | ORAL | 1 refills | Status: DC | PRN

## 2018-01-24 MED ORDER — DEXAMETHASONE SODIUM PHOSPHATE 10 MG/ML IJ SOLN
10.0000 mg | Freq: Once | INTRAMUSCULAR | Status: AC
  Administered 2018-01-24: 10 mg via INTRAVENOUS

## 2018-01-24 MED ORDER — DEXTROSE 5 % IV SOLN
Freq: Once | INTRAVENOUS | Status: AC
  Administered 2018-01-24: 12:00:00 via INTRAVENOUS
  Filled 2018-01-24: qty 250

## 2018-01-24 MED ORDER — HEPARIN SOD (PORK) LOCK FLUSH 100 UNIT/ML IV SOLN
500.0000 [IU] | Freq: Once | INTRAVENOUS | Status: DC | PRN
  Filled 2018-01-24: qty 5

## 2018-01-24 MED ORDER — SODIUM CHLORIDE 0.9 % IJ SOLN
10.0000 mL | INTRAMUSCULAR | Status: DC | PRN
  Filled 2018-01-24: qty 10

## 2018-01-24 NOTE — Patient Instructions (Signed)
Doxorubicin Liposomal injection What is this medicine? LIPOSOMAL DOXORUBICIN (LIP oh som al dox oh ROO bi sin) is a chemotherapy drug. This medicine is used to treat many kinds of cancer like Kaposi's sarcoma, multiple myeloma, and ovarian cancer. This medicine may be used for other purposes; ask your health care provider or pharmacist if you have questions. COMMON BRAND NAME(S): Doxil, Lipodox What should I tell my health care provider before I take this medicine? They need to know if you have any of these conditions: -blood disorders -heart disease -infection (especially a virus infection such as chickenpox, cold sores, or herpes) -liver disease -recent or ongoing radiation therapy -an unusual or allergic reaction to doxorubicin, other chemotherapy agents, soybeans, other medicines, foods, dyes, or preservatives -pregnant or trying to get pregnant -breast-feeding How should I use this medicine? This drug is given as an infusion into a vein. It is administered in a hospital or clinic by a specially trained health care professional. If you have pain, swelling, burning or any unusual feeling around the site of your injection, tell your health care professional right away. Talk to your pediatrician regarding the use of this medicine in children. Special care may be needed. Overdosage: If you think you have taken too much of this medicine contact a poison control center or emergency room at once. NOTE: This medicine is only for you. Do not share this medicine with others. What if I miss a dose? It is important not to miss your dose. Call your doctor or health care professional if you are unable to keep an appointment. What may interact with this medicine? Do not take this medicine with any of the following medications: -zidovudine This medicine may also interact with the following medications: -medicines to increase blood counts like filgrastim, pegfilgrastim, sargramostim -vaccines Talk to  your doctor or health care professional before taking any of these medicines: -acetaminophen -aspirin -ibuprofen -ketoprofen -naproxen This list may not describe all possible interactions. Give your health care provider a list of all the medicines, herbs, non-prescription drugs, or dietary supplements you use. Also tell them if you smoke, drink alcohol, or use illegal drugs. Some items may interact with your medicine. What should I watch for while using this medicine? Your condition will be monitored carefully while you are receiving this medicine. You will need important blood work done while you are taking this medicine. This drug may make you feel generally unwell. This is not uncommon, as chemotherapy can affect healthy cells as well as cancer cells. Report any side effects. Continue your course of treatment even though you feel ill unless your doctor tells you to stop. Your urine may turn orange-red for a few days after your dose. This is not blood. If your urine is dark or brown, call your doctor. In some cases, you may be given additional medicines to help with side effects. Follow all directions for their use. Call your doctor or health care professional for advice if you get a fever (100.5 degrees F or higher), chills or sore throat, or other symptoms of a cold or flu. Do not treat yourself. This drug decreases your body's ability to fight infections. Try to avoid being around people who are sick. This medicine may increase your risk to bruise or bleed. Call your doctor or health care professional if you notice any unusual bleeding. Be careful brushing and flossing your teeth or using a toothpick because you may get an infection or bleed more easily. If you have any dental  work done, tell your dentist you are receiving this medicine. Avoid taking products that contain aspirin, acetaminophen, ibuprofen, naproxen, or ketoprofen unless instructed by your doctor. These medicines may hide a  fever. Men and women of childbearing age should use effective birth control methods while using taking this medicine. Do not become pregnant while taking this medicine. There is a potential for serious side effects to an unborn child. Talk to your health care professional or pharmacist for more information. Do not breast-feed an infant while taking this medicine. Talk to your doctor about your risk of cancer. You may be more at risk for certain types of cancers if you take this medicine. What side effects may I notice from receiving this medicine? Side effects that you should report to your doctor or health care professional as soon as possible: -allergic reactions like skin rash, itching or hives, swelling of the face, lips, or tongue -low blood counts - this medicine may decrease the number of white blood cells, red blood cells and platelets. You may be at increased risk for infections and bleeding. -signs of hand-foot syndrome - tingling or burning, redness, flaking, swelling, small blisters, or small sores on the palms of your hands or the soles of your feet -signs of infection - fever or chills, cough, sore throat, pain or difficulty passing urine -signs of decreased platelets or bleeding - bruising, pinpoint red spots on the skin, black, tarry stools, blood in the urine -signs of decreased red blood cells - unusually weak or tired, fainting spells, lightheadedness -back pain, chills, facial flushing, fever, headache, tightness in the chest or throat during the infusion -breathing problems -chest pain -fast, irregular heartbeat -mouth pain, redness, sores -pain, swelling, redness at site where injected -pain, tingling, numbness in the hands or feet -swelling of ankles, feet, or hands -vomiting Side effects that usually do not require medical attention (report to your doctor or health care professional if they continue or are bothersome): -diarrhea -hair loss -loss of appetite -nail  discoloration or damage -nausea -red or watery eyes -red colored urine -stomach upset This list may not describe all possible side effects. Call your doctor for medical advice about side effects. You may report side effects to FDA at 1-800-FDA-1088. Where should I keep my medicine? This drug is given in a hospital or clinic and will not be stored at home. NOTE: This sheet is a summary. It may not cover all possible information. If you have questions about this medicine, talk to your doctor, pharmacist, or health care provider.  2018 [employer]vier/Gold Standard (2012-01-22 10:12:56)  

## 2018-01-24 NOTE — Progress Notes (Signed)
Hematology and Oncology Follow Up Visit  Todd Vincent [id number] 12-17-1975 42 y.o. 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 He was diagnosed with rectal cancer earlier this year. He states that this was treated with surgery and he did not need chemo or radiation.  He had surgery with Todd Vincent with [facility] in [place]. He a total colectomy with ileostomy. Todd Vincent signed a medical release for [place] to obtain records from his surgeon including pathology reports. I have faxed this request to his office. At this time we do not know the staging of his cancer.  He feels that his skin lesions are becoming darker pigmented. His last treatment with Doxil was in February.  ECHO in August showed an EF of 55-60%.  No fever, chills, n/v, cough, rash, dizziness, SOB, chest pain, palpitations, abdominal pain or changes in bowel or bladder habits.  No c/o pain.  The chronic swelling in his lower extremities is stable. He takes Maxzide daily as prescribed and avoids salt.  The neuropathy in his feet is unchanged. No lymphadenopathy noted on exam.  His appetite comes and goes. He eats 1-2 meals a day and then supplements daily with Boost extra protein. He is staying well hydrated. His weight is stable.   ECOG Performance Status: 1 - Symptomatic but completely ambulatory  Medications:  Allergies as of 01/24/2018      Reactions   Sulfa Antibiotics Rash, Itching   Extreme itching    Sulfasalazine Rash   Extreme itching       Medication List        Accurate as of 01/24/18 10:53 AM. Always use your most recent med list.          acyclovir 800 MG tablet Commonly known as:  ZOVIRAX Take 800 mg by mouth 5 (five) times daily.   citalopram 10 MG tablet Commonly known as:   CELEXA TAKE 1 TABLET(10 MG) BY MOUTH DAILY   COCONUT OIL PO Take 1,000 mg by mouth 2 (two) times daily.   dapsone 100 MG tablet Take 1 tablet (100 mg total) by mouth daily.   darunavir 800 MG tablet Commonly known as:  PREZISTA TAKE 1 TABLET BY MOUTH EVERY DAY WITH BREAKFAST   dronabinol 5 MG capsule Commonly known as:  MARINOL TAKE 1 CAPSULE BY MOUTH TWICE DAILY BEFORE LUNCH AND SUPPER   dronabinol 5 MG capsule Commonly known as:  MARINOL TAKE 1 CAPSULE BY MOUTH TWICE DAILY BEFORE LUNCH AND SUPPER   elvitegravir-cobicistat-emtricitabine-tenofovir 150-150-200-10 MG Tabs tablet Commonly known as:  GENVOYA Take 1 tablet by mouth daily.   LORazepam 0.5 MG tablet Commonly known as:  ATIVAN TAKE 1 BY MOUTH EVERY 6 HOURS AS NEEDED   LYRICA 75 MG capsule Generic drug:  pregabalin TAKE 1 CAPSULE BY MOUTH EVERY DAY AS NEEDED   pregabalin 75 MG capsule Commonly known as:  LYRICA Take 1 capsule (75 mg total) by mouth daily.   meclizine 25 MG tablet Commonly known as:  ANTIVERT Take 1 tablet (25 mg total) by mouth every 6 (six) hours as needed for dizziness.   ondansetron 8 MG tablet Commonly known as:  ZOFRAN Take 1 tablet (8 mg total) by mouth 2 (two) times daily. Start the day after chemo for 2 days. Then take as  needed for nausea or vomiting.   temazepam 30 MG capsule Commonly known as:  RESTORIL Take 1 capsule (30 mg total) by mouth at bedtime as needed for sleep.   triamterene-hydrochlorothiazide 37.5-25 MG tablet Commonly known as:  MAXZIDE-25 Take 1 tablet by mouth daily.   valACYclovir 1000 MG tablet Commonly known as:  VALTREX TAKE 1 TABLET BY MOUTH AS NEEDED       Allergies:  Allergies  Allergen Reactions  . Sulfa Antibiotics Rash and Itching    Extreme itching   . Sulfasalazine Rash    Extreme itching     Past Medical History, Surgical history, Social history, and Family History were reviewed and updated.  Review of Systems: All other 10 point  review of systems is negative.   Physical Exam:  vitals were not taken for this visit.   Wt Readings from Last 3 Encounters:  01/10/18 158 lb 12.8 oz (72 kg)  07/13/17 166 lb 8 oz (75.5 kg)  06/21/17 170 lb (77.1 kg)    Ocular: Sclerae unicteric, pupils equal, round and reactive to light Ear-nose-throat: Oropharynx clear, dentition fair Lymphatic: No cervical, supraclavicular or axillary adenopathy Lungs no rales or rhonchi, good excursion bilaterally Heart regular rate and rhythm, no murmur appreciated Abd soft, nontender, positive bowel sounds, no liver or spleen tip palpated on exam, no fluid wave  MSK no focal spinal tenderness, no joint edema Neuro: non-focal, well-oriented, appropriate affect Breasts: Deferred   Lab Results  Component Value Date   WBC 7.8 01/24/2018   HGB 14.6 01/24/2018   HCT 42.8 01/24/2018   MCV 92.4 01/24/2018   PLT 315 01/24/2018   No results found for: FERRITIN, IRON, TIBC, UIBC, IRONPCTSAT Lab Results  Component Value Date   RBC 4.63 01/24/2018   No results found for: KPAFRELGTCHN, LAMBDASER, KAPLAMBRATIO No results found for: IGGSERUM, IGA, IGMSERUM No results found for: Todd Vincent, MSPIKE, SPEI   Chemistry      Component Value Date/Time   NA 139 06/21/2017 0923   NA 141 04/19/2017 1007   NA 141 12/13/2015 0827   K 3.4 06/21/2017 0923   K 3.4 04/19/2017 1007   K 4.0 12/13/2015 0827   CL 107 06/21/2017 0923   CL 105 04/19/2017 1007   CO2 27 06/21/2017 0923   CO2 27 04/19/2017 1007   CO2 26 12/13/2015 0827   BUN 12 06/21/2017 0923   BUN 8 04/19/2017 1007   BUN 8.2 12/13/2015 0827   CREATININE 1.00 06/21/2017 0923   CREATININE 1.3 (H) 04/19/2017 1007   CREATININE 1.2 12/13/2015 0827      Component Value Date/Time   CALCIUM 8.8 06/21/2017 0923   CALCIUM 9.3 04/19/2017 1007   CALCIUM 9.2 12/13/2015 0827   ALKPHOS 86 (H) 06/21/2017 0923   ALKPHOS 82 04/19/2017 1007   ALKPHOS 78  12/13/2015 0827   AST 18 06/21/2017 0923   AST 14 12/13/2015 0827   ALT 16 06/21/2017 0923   ALT 23 04/19/2017 1007   ALT 13 12/13/2015 0827   BILITOT 0.6 06/21/2017 0923   BILITOT 0.40 12/13/2015 0827      Impression and Plan: Todd Vincent is a very pleasant 42 yo African American gentleman with Kaposi's sarcoma treated with Doxil. This has been on hold since earlier this year due to his diagnosis of rectal cancer and subsequent surgery. We are awaiting his records from [facility] in [place].  He states that he did not need chemo or radiation and has  recuperated nicely.  He has noted that his skin lesions have darkened.  ECHO last week shoed an EF of 55-60%.  We will resume treatment with Doxil today as planned and get him onto a monthly schedule for now.  He will contact our office with any questions or concerns. We can certainly see him sooner if need be.   Laverna Peace, NP 9/9/201910:53 AM

## 2018-01-25 LAB — T-HELPER CELLS (CD4) COUNT (NOT AT ARMC)
CD4 % Helper T Cell: 17 % — ABNORMAL LOW (ref 33–55)
CD4 T Cell Abs: 510 /uL (ref 400–2700)

## 2018-01-31 ENCOUNTER — Other Ambulatory Visit: Payer: Self-pay | Admitting: *Deleted

## 2018-01-31 DIAGNOSIS — B20 Human immunodeficiency virus [HIV] disease: Secondary | ICD-10-CM

## 2018-02-01 ENCOUNTER — Encounter: Payer: Self-pay | Admitting: *Deleted

## 2018-02-01 NOTE — Telephone Encounter (Signed)
Patient request Dapson per Dr Linus Salmons he no longer needs the Rx. Patient notified.

## 2018-02-10 ENCOUNTER — Other Ambulatory Visit: Payer: Self-pay | Admitting: Hematology & Oncology

## 2018-02-10 DIAGNOSIS — C469 Kaposi's sarcoma, unspecified: Secondary | ICD-10-CM

## 2018-02-10 DIAGNOSIS — B029 Zoster without complications: Secondary | ICD-10-CM

## 2018-02-21 ENCOUNTER — Inpatient Hospital Stay: Payer: Medicare Other

## 2018-02-21 ENCOUNTER — Inpatient Hospital Stay: Payer: Medicare Other | Attending: Hematology & Oncology | Admitting: Family

## 2018-02-21 VITALS — BP 128/69 | HR 85 | Temp 98.5°F | Resp 16 | Wt 155.0 lb

## 2018-02-21 DIAGNOSIS — B20 Human immunodeficiency virus [HIV] disease: Secondary | ICD-10-CM

## 2018-02-21 DIAGNOSIS — C469 Kaposi's sarcoma, unspecified: Secondary | ICD-10-CM

## 2018-02-21 DIAGNOSIS — C20 Malignant neoplasm of rectum: Secondary | ICD-10-CM | POA: Diagnosis not present

## 2018-02-21 DIAGNOSIS — Z5111 Encounter for antineoplastic chemotherapy: Secondary | ICD-10-CM | POA: Insufficient documentation

## 2018-02-21 DIAGNOSIS — Z23 Encounter for immunization: Secondary | ICD-10-CM

## 2018-02-21 LAB — CMP (CANCER CENTER ONLY)
ALT: 21 U/L (ref 10–47)
AST: 16 U/L (ref 11–38)
Albumin: 3.5 g/dL (ref 3.5–5.0)
Alkaline Phosphatase: 86 U/L — ABNORMAL HIGH (ref 26–84)
Anion gap: 0 — ABNORMAL LOW (ref 5–15)
BUN: 6 mg/dL — AB (ref 7–22)
CO2: 30 mmol/L (ref 18–33)
Calcium: 9.7 mg/dL (ref 8.0–10.3)
Chloride: 112 mmol/L — ABNORMAL HIGH (ref 98–108)
Creatinine: 0.9 mg/dL (ref 0.60–1.20)
GLUCOSE: 146 mg/dL — AB (ref 73–118)
POTASSIUM: 4 mmol/L (ref 3.3–4.7)
SODIUM: 138 mmol/L (ref 128–145)
TOTAL PROTEIN: 7.4 g/dL (ref 6.4–8.1)
Total Bilirubin: 0.5 mg/dL (ref 0.2–1.6)

## 2018-02-21 LAB — CBC WITH DIFFERENTIAL (CANCER CENTER ONLY)
Basophils Absolute: 0 10*3/uL (ref 0.0–0.1)
Basophils Relative: 0 %
EOS ABS: 0.2 10*3/uL (ref 0.0–0.5)
EOS PCT: 2 %
HCT: 44.3 % (ref 38.7–49.9)
Hemoglobin: 15.1 g/dL (ref 13.0–17.1)
LYMPHS ABS: 2 10*3/uL (ref 0.9–3.3)
LYMPHS PCT: 25 %
MCH: 30.4 pg (ref 28.0–33.4)
MCHC: 34.1 g/dL (ref 32.0–35.9)
MCV: 89.1 fL (ref 82.0–98.0)
MONO ABS: 0.9 10*3/uL (ref 0.1–0.9)
MONOS PCT: 12 %
Neutro Abs: 4.9 10*3/uL (ref 1.5–6.5)
Neutrophils Relative %: 61 %
PLATELETS: 337 10*3/uL (ref 145–400)
RBC: 4.97 MIL/uL (ref 4.20–5.70)
RDW: 13.1 % (ref 11.1–15.7)
WBC Count: 8 10*3/uL (ref 4.0–10.0)

## 2018-02-21 MED ORDER — ONDANSETRON HCL 8 MG PO TABS
8.0000 mg | ORAL_TABLET | Freq: Once | ORAL | Status: AC
  Administered 2018-02-21: 8 mg via ORAL

## 2018-02-21 MED ORDER — DEXAMETHASONE SODIUM PHOSPHATE 10 MG/ML IJ SOLN
INTRAMUSCULAR | Status: AC
  Filled 2018-02-21: qty 1

## 2018-02-21 MED ORDER — ONDANSETRON HCL 8 MG PO TABS
ORAL_TABLET | ORAL | Status: AC
  Filled 2018-02-21: qty 1

## 2018-02-21 MED ORDER — DEXTROSE 5 % IV SOLN
Freq: Once | INTRAVENOUS | Status: AC
  Administered 2018-02-21: 12:00:00 via INTRAVENOUS
  Filled 2018-02-21: qty 250

## 2018-02-21 MED ORDER — INFLUENZA VAC SPLIT QUAD 0.5 ML IM SUSY
0.5000 mL | PREFILLED_SYRINGE | Freq: Once | INTRAMUSCULAR | Status: AC
  Administered 2018-02-21: 0.5 mL via INTRAMUSCULAR

## 2018-02-21 MED ORDER — DOXORUBICIN HCL LIPOSOMAL CHEMO INJECTION 2 MG/ML
20.0000 mg/m2 | Freq: Once | INTRAVENOUS | Status: AC
  Administered 2018-02-21: 36 mg via INTRAVENOUS
  Filled 2018-02-21: qty 18

## 2018-02-21 MED ORDER — INFLUENZA VAC SPLIT QUAD 0.5 ML IM SUSY
PREFILLED_SYRINGE | INTRAMUSCULAR | Status: AC
  Filled 2018-02-21: qty 0.5

## 2018-02-21 MED ORDER — DEXAMETHASONE SODIUM PHOSPHATE 10 MG/ML IJ SOLN
10.0000 mg | Freq: Once | INTRAMUSCULAR | Status: AC
  Administered 2018-02-21: 10 mg via INTRAVENOUS

## 2018-02-21 MED FILL — TEMAZEPAM 30 MG CAPSULE: 30 | 90 days supply | Qty: 90 | Fill #0

## 2018-02-21 NOTE — Progress Notes (Signed)
Hematology and Oncology Follow Up Visit  Todd Vincent [id number] 1976-01-12 42 y.o. 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 He is doing well and his skin lesions are stable. No open lesions. Hyperpigmentation present.  He had an outbreak of shingles on his left buttock which has since dried up with Valtrex and pain managed with Lyrica.  He also had fatigue, hot flashes and mild nausea (no vomiting) after his last treatment for the first week.  He follows up with Todd Vincent later this year. He has had some tan/pink discharge from his bottom which he states he surgeon felt was normal.  No c/o pain at this time.  No lymphadenopathy noted on exam.  No fever, chills, cough, rash, SOB, chest pain, palpitations, abdominal pain or changes in bladder or ostomy habits.  He has occasional episodes of dizziness due to vertigo.  No episodes of bleeding, no bruising or petechiae.  The neuropathy in his lower extremities is stable but seems to be a but more prominent in his upper extremities since restarting treatment.  The swelling in his lower extremities is stable and he states he has not had to take his diuretic for several months.   ECOG Performance Status: 1 - Symptomatic but completely ambulatory  Medications:  Allergies as of 02/21/2018      Reactions   Sulfa Antibiotics Rash, Itching   Extreme itching    Sulfasalazine Rash   Extreme itching       Medication List        Accurate as of 02/21/18  1:15 PM. Always use your most recent med list.          acyclovir 800 MG tablet Commonly known as:  ZOVIRAX Take 800 mg by mouth 5 (five) times daily.   citalopram 10 MG tablet Commonly known as:   CELEXA TAKE 1 TABLET(10 MG) BY MOUTH DAILY   COCONUT OIL PO Take 1,000 mg by mouth 2 (two) times daily.   darunavir 800 MG tablet Commonly known as:  PREZISTA TAKE 1 TABLET BY MOUTH EVERY DAY WITH BREAKFAST   dronabinol 5 MG capsule Commonly known as:  MARINOL TAKE 1 CAPSULE BY MOUTH TWICE DAILY BEFORE LUNCH AND SUPPER   dronabinol 5 MG capsule Commonly known as:  MARINOL TAKE 1 CAPSULE BY MOUTH TWICE DAILY BEFORE LUNCH AND SUPPER   elvitegravir-cobicistat-emtricitabine-tenofovir 150-150-200-10 MG Tabs tablet Commonly known as:  GENVOYA Take 1 tablet by mouth daily.   LORazepam 0.5 MG tablet Commonly known as:  ATIVAN TAKE 1 BY MOUTH EVERY 6 HOURS AS NEEDED   LYRICA 75 MG capsule Generic drug:  pregabalin TAKE 1 CAPSULE BY MOUTH EVERY DAY AS NEEDED   pregabalin 75 MG capsule Commonly known as:  LYRICA Take 1 capsule (75 mg total) by mouth daily.   meclizine 25 MG tablet Commonly known as:  ANTIVERT Take 1 tablet (25 mg total) by mouth every 6 (six) hours as needed for dizziness.   ondansetron 8 MG tablet Commonly known as:  ZOFRAN Take 1 tablet (8 mg total) by mouth 2 (two) times daily. Start the day after chemo for 2 days. Then take as needed for nausea or vomiting.   temazepam 30 MG capsule Commonly known as:  RESTORIL Take 1 capsule (  30 mg total) by mouth at bedtime as needed for sleep.   triamterene-hydrochlorothiazide 37.5-25 MG tablet Commonly known as:  MAXZIDE-25 Take 1 tablet by mouth daily.   valACYclovir 1000 MG tablet Commonly known as:  VALTREX TAKE 1 TABLET BY MOUTH AS NEEDED       Allergies:  Allergies  Allergen Reactions  . Sulfa Antibiotics Rash and Itching    Extreme itching   . Sulfasalazine Rash    Extreme itching     Past Medical History, Surgical history, Social history, and Family History were reviewed and updated.  Review of Systems: All other 10 point review of systems is negative.   Physical Exam:  weight is 155 lb  (70.3 kg). His oral temperature is 98.5 F (36.9 C). His blood pressure is 128/69 and his pulse is 85. His respiration is 16 and oxygen saturation is 100%.   Wt Readings from Last 3 Encounters:  02/21/18 155 lb (70.3 kg)  01/24/18 156 lb (70.8 kg)  01/10/18 158 lb 12.8 oz (72 kg)    Ocular: Sclerae unicteric, pupils equal, round and reactive to light Ear-nose-throat: Oropharynx clear, dentition fair Lymphatic: No cervical, supraclavicular or axillary adenopathy Lungs no rales or rhonchi, good excursion bilaterally Heart regular rate and rhythm, no murmur appreciated Abd soft, nontender, positive bowel sounds, no liver or spleen tip palpated on exam, no fluid wave  MSK no focal spinal tenderness, no joint edema Neuro: non-focal, well-oriented, appropriate affect Breasts: Deferred   Lab Results  Component Value Date   WBC 8.0 02/21/2018   HGB 15.1 02/21/2018   HCT 44.3 02/21/2018   MCV 89.1 02/21/2018   PLT 337 02/21/2018   No results found for: FERRITIN, IRON, TIBC, UIBC, IRONPCTSAT Lab Results  Component Value Date   RBC 4.97 02/21/2018   No results found for: KPAFRELGTCHN, LAMBDASER, KAPLAMBRATIO No results found for: IGGSERUM, IGA, IGMSERUM No results found for: Kathrynn Ducking, MSPIKE, SPEI   Chemistry      Component Value Date/Time   NA 138 02/21/2018 1110   NA 141 04/19/2017 1007   NA 141 12/13/2015 0827   K 4.0 02/21/2018 1110   K 3.4 04/19/2017 1007   K 4.0 12/13/2015 0827   CL 112 (H) 02/21/2018 1110   CL 105 04/19/2017 1007   CO2 30 02/21/2018 1110   CO2 27 04/19/2017 1007   CO2 26 12/13/2015 0827   BUN 6 (L) 02/21/2018 1110   BUN 8 04/19/2017 1007   BUN 8.2 12/13/2015 0827   CREATININE 0.90 02/21/2018 1110   CREATININE 1.3 (H) 04/19/2017 1007   CREATININE 1.2 12/13/2015 0827      Component Value Date/Time   CALCIUM 9.7 02/21/2018 1110   CALCIUM 9.3 04/19/2017 1007   CALCIUM 9.2 12/13/2015 0827   ALKPHOS  86 (H) 02/21/2018 1110   ALKPHOS 82 04/19/2017 1007   ALKPHOS 78 12/13/2015 0827   AST 16 02/21/2018 1110   AST 14 12/13/2015 0827   ALT 21 02/21/2018 1110   ALT 23 04/19/2017 1007   ALT 13 12/13/2015 0827   BILITOT 0.5 02/21/2018 1110   BILITOT 0.40 12/13/2015 0827      Impression and Plan: Todd Vincent is a very pleasant 42 yo African American gentleman with Kaposi's sarcoma treated with Doxil. He had some symptoms for the first 7 days after treatment as mentioned above but states these were tolerable.  We will proceed with treatment today as planned.  He was also diagnosed and had  surgery for rectal cancer earlier this year. He follows up with surgery later this year.  He received his flu shot today.  We will see him back in another 2 months for follow-up and treatment. He will contact our office with any questions or concerns. We can certainly see him sooner if need be.   Laverna Peace, NP 10/7/20191:15 PM

## 2018-02-22 LAB — T-HELPER CELLS (CD4) COUNT (NOT AT ARMC)
CD4 T CELL HELPER: 16 % — AB (ref 33–55)
CD4 T Cell Abs: 330 /uL — ABNORMAL LOW (ref 400–2700)

## 2018-03-02 ENCOUNTER — Encounter: Payer: Self-pay | Admitting: Family

## 2018-03-02 ENCOUNTER — Other Ambulatory Visit: Payer: Self-pay | Admitting: *Deleted

## 2018-03-02 DIAGNOSIS — B029 Zoster without complications: Secondary | ICD-10-CM

## 2018-03-02 DIAGNOSIS — C469 Kaposi's sarcoma, unspecified: Secondary | ICD-10-CM

## 2018-03-02 MED ORDER — PREGABALIN 75 MG PO CAPS: 75.0000 mg | ORAL_CAPSULE | Freq: Two times a day (BID) | ORAL | 3 refills | Status: DC

## 2018-03-07 ENCOUNTER — Telehealth: Payer: Self-pay | Admitting: Hematology & Oncology

## 2018-03-07 NOTE — Telephone Encounter (Signed)
Received call from pt to resch 11/4 appt to 11/8 at 1015 am

## 2018-03-12 ENCOUNTER — Other Ambulatory Visit: Payer: Self-pay | Admitting: Hematology & Oncology

## 2018-03-12 DIAGNOSIS — C469 Kaposi's sarcoma, unspecified: Secondary | ICD-10-CM

## 2018-03-12 DIAGNOSIS — B029 Zoster without complications: Secondary | ICD-10-CM

## 2018-03-21 ENCOUNTER — Other Ambulatory Visit: Payer: Medicare Other

## 2018-03-21 ENCOUNTER — Ambulatory Visit: Payer: Medicare Other

## 2018-03-25 ENCOUNTER — Other Ambulatory Visit: Payer: Self-pay

## 2018-03-25 ENCOUNTER — Inpatient Hospital Stay: Payer: Medicare Other

## 2018-03-25 ENCOUNTER — Inpatient Hospital Stay: Payer: Medicare Other | Attending: Hematology & Oncology

## 2018-03-25 VITALS — BP 112/65 | HR 66 | Temp 98.1°F | Resp 18

## 2018-03-25 DIAGNOSIS — C20 Malignant neoplasm of rectum: Secondary | ICD-10-CM | POA: Insufficient documentation

## 2018-03-25 DIAGNOSIS — B20 Human immunodeficiency virus [HIV] disease: Secondary | ICD-10-CM

## 2018-03-25 DIAGNOSIS — C469 Kaposi's sarcoma, unspecified: Secondary | ICD-10-CM

## 2018-03-25 DIAGNOSIS — Z5111 Encounter for antineoplastic chemotherapy: Secondary | ICD-10-CM | POA: Insufficient documentation

## 2018-03-25 LAB — CMP (CANCER CENTER ONLY)
ALK PHOS: 95 U/L — AB (ref 26–84)
ALT: 21 U/L (ref 10–47)
AST: 22 U/L (ref 11–38)
Albumin: 3.6 g/dL (ref 3.5–5.0)
Anion gap: 4 — ABNORMAL LOW (ref 5–15)
BILIRUBIN TOTAL: 0.6 mg/dL (ref 0.2–1.6)
BUN: 10 mg/dL (ref 7–22)
CALCIUM: 8.7 mg/dL (ref 8.0–10.3)
CO2: 27 mmol/L (ref 18–33)
Chloride: 103 mmol/L (ref 98–108)
Creatinine: 1.2 mg/dL (ref 0.60–1.20)
GLUCOSE: 124 mg/dL — AB (ref 73–118)
Potassium: 3.7 mmol/L (ref 3.3–4.7)
Sodium: 134 mmol/L (ref 128–145)
TOTAL PROTEIN: 7.2 g/dL (ref 6.4–8.1)

## 2018-03-25 LAB — CBC WITH DIFFERENTIAL (CANCER CENTER ONLY)
Abs Immature Granulocytes: 0.05 10*3/uL (ref 0.00–0.07)
BASOS ABS: 0 10*3/uL (ref 0.0–0.1)
Basophils Relative: 0 %
EOS ABS: 0.2 10*3/uL (ref 0.0–0.5)
EOS PCT: 2 %
HCT: 45.9 % (ref 39.0–52.0)
Hemoglobin: 15.1 g/dL (ref 13.0–17.0)
Immature Granulocytes: 1 %
LYMPHS PCT: 35 %
Lymphs Abs: 3.5 10*3/uL (ref 0.7–4.0)
MCH: 29 pg (ref 26.0–34.0)
MCHC: 32.9 g/dL (ref 30.0–36.0)
MCV: 88.3 fL (ref 80.0–100.0)
MONO ABS: 0.8 10*3/uL (ref 0.1–1.0)
Monocytes Relative: 8 %
NRBC: 0 % (ref 0.0–0.2)
Neutro Abs: 5.3 10*3/uL (ref 1.7–7.7)
Neutrophils Relative %: 54 %
Platelet Count: 368 10*3/uL (ref 150–400)
RBC: 5.2 MIL/uL (ref 4.22–5.81)
RDW: 15 % (ref 11.5–15.5)
WBC: 9.9 10*3/uL (ref 4.0–10.5)

## 2018-03-25 LAB — T-HELPER CELLS (CD4) COUNT (NOT AT ARMC)
CD4 T CELL HELPER: 18 % — AB (ref 33–55)
CD4 T Cell Abs: 670 /uL (ref 400–2700)

## 2018-03-25 MED ORDER — ONDANSETRON HCL 8 MG PO TABS
ORAL_TABLET | ORAL | Status: AC
  Filled 2018-03-25: qty 1

## 2018-03-25 MED ORDER — DEXAMETHASONE SODIUM PHOSPHATE 10 MG/ML IJ SOLN
INTRAMUSCULAR | Status: AC
  Filled 2018-03-25: qty 1

## 2018-03-25 MED ORDER — DEXAMETHASONE SODIUM PHOSPHATE 10 MG/ML IJ SOLN
10.0000 mg | Freq: Once | INTRAMUSCULAR | Status: AC
  Administered 2018-03-25: 10 mg via INTRAVENOUS

## 2018-03-25 MED ORDER — DOXORUBICIN HCL LIPOSOMAL CHEMO INJECTION 2 MG/ML
20.0000 mg/m2 | Freq: Once | INTRAVENOUS | Status: AC
  Administered 2018-03-25: 36 mg via INTRAVENOUS
  Filled 2018-03-25: qty 18

## 2018-03-25 MED ORDER — ONDANSETRON HCL 8 MG PO TABS
8.0000 mg | ORAL_TABLET | Freq: Once | ORAL | Status: AC
  Administered 2018-03-25: 8 mg via ORAL

## 2018-03-25 MED ORDER — DEXTROSE 5 % IV SOLN
Freq: Once | INTRAVENOUS | Status: AC
  Administered 2018-03-25: 11:00:00 via INTRAVENOUS
  Filled 2018-03-25: qty 250

## 2018-03-25 NOTE — Patient Instructions (Signed)
[facility] Discharge Instructions for Patients Receiving Chemotherapy  Today you received the following chemotherapy agents Doxil.   To help prevent nausea and vomiting after your treatment, we encourage you to take your nausea medication as prescribed.    If you develop nausea and vomiting that is not controlled by your nausea medication, call the clinic.   BELOW ARE SYMPTOMS THAT SHOULD BE REPORTED IMMEDIATELY:  *FEVER GREATER THAN 100.5 F  *CHILLS WITH OR WITHOUT FEVER  NAUSEA AND VOMITING THAT IS NOT CONTROLLED WITH YOUR NAUSEA MEDICATION  *UNUSUAL SHORTNESS OF BREATH  *UNUSUAL BRUISING OR BLEEDING  TENDERNESS IN MOUTH AND THROAT WITH OR WITHOUT PRESENCE OF ULCERS  *URINARY PROBLEMS  *BOWEL PROBLEMS  UNUSUAL RASH Items with * indicate a potential emergency and should be followed up as soon as possible.  Feel free to call the clinic should you have any questions or concerns. The clinic phone number is [phone].  Please show the CHEMO ALERT CARD at check-in to the Emergency Department and triage nurse.   

## 2018-04-11 ENCOUNTER — Other Ambulatory Visit: Payer: Self-pay | Admitting: Hematology & Oncology

## 2018-04-11 DIAGNOSIS — C469 Kaposi's sarcoma, unspecified: Secondary | ICD-10-CM

## 2018-04-11 DIAGNOSIS — B029 Zoster without complications: Secondary | ICD-10-CM

## 2018-04-18 ENCOUNTER — Ambulatory Visit: Payer: Medicare Other

## 2018-04-18 ENCOUNTER — Ambulatory Visit: Payer: Medicare Other | Admitting: Family

## 2018-04-18 ENCOUNTER — Other Ambulatory Visit: Payer: Medicare Other

## 2018-04-21 DIAGNOSIS — C210 Malignant neoplasm of anus, unspecified: Secondary | ICD-10-CM | POA: Diagnosis not present

## 2018-04-25 ENCOUNTER — Telehealth: Payer: Self-pay | Admitting: Family

## 2018-04-25 ENCOUNTER — Other Ambulatory Visit: Payer: Self-pay

## 2018-04-25 ENCOUNTER — Encounter: Payer: Self-pay | Admitting: Family

## 2018-04-25 ENCOUNTER — Inpatient Hospital Stay: Payer: Medicare Other | Attending: Hematology & Oncology

## 2018-04-25 ENCOUNTER — Inpatient Hospital Stay: Payer: Medicare Other

## 2018-04-25 ENCOUNTER — Other Ambulatory Visit: Payer: Self-pay | Admitting: Family

## 2018-04-25 ENCOUNTER — Inpatient Hospital Stay (HOSPITAL_BASED_OUTPATIENT_CLINIC_OR_DEPARTMENT_OTHER): Payer: Medicare Other | Admitting: Family

## 2018-04-25 VITALS — BP 118/69 | HR 102 | Temp 98.6°F | Resp 18 | Ht 67.0 in | Wt 153.0 lb

## 2018-04-25 DIAGNOSIS — C469 Kaposi's sarcoma, unspecified: Secondary | ICD-10-CM

## 2018-04-25 DIAGNOSIS — C20 Malignant neoplasm of rectum: Secondary | ICD-10-CM

## 2018-04-25 DIAGNOSIS — Z5111 Encounter for antineoplastic chemotherapy: Secondary | ICD-10-CM | POA: Diagnosis not present

## 2018-04-25 LAB — CMP (CANCER CENTER ONLY)
ALBUMIN: 4.1 g/dL (ref 3.5–5.0)
ALK PHOS: 80 U/L (ref 38–126)
ALT: 15 U/L (ref 0–44)
AST: 15 U/L (ref 15–41)
Anion gap: 9 (ref 5–15)
BUN: 6 mg/dL (ref 6–20)
CALCIUM: 8.9 mg/dL (ref 8.9–10.3)
CO2: 26 mmol/L (ref 22–32)
Chloride: 101 mmol/L (ref 98–111)
Creatinine: 1.12 mg/dL (ref 0.61–1.24)
GFR, Estimated: >60 mL/min (ref >60)
GLUCOSE: 135 mg/dL — AB (ref 70–99)
Potassium: 4.2 mmol/L (ref 3.5–5.1)
SODIUM: 136 mmol/L (ref 135–145)
Total Bilirubin: 0.4 mg/dL (ref 0.3–1.2)
Total Protein: 7.1 g/dL (ref 6.5–8.1)

## 2018-04-25 LAB — CBC WITH DIFFERENTIAL (CANCER CENTER ONLY)
ABS IMMATURE GRANULOCYTES: 0.06 10*3/uL (ref 0.00–0.07)
Basophils Absolute: 0 10*3/uL (ref 0.0–0.1)
Basophils Relative: 0 %
Eosinophils Absolute: 0.1 10*3/uL (ref 0.0–0.5)
Eosinophils Relative: 1 %
HCT: 45.1 % (ref 39.0–52.0)
HEMOGLOBIN: 14.6 g/dL (ref 13.0–17.0)
Immature Granulocytes: 1 %
LYMPHS ABS: 2.8 10*3/uL (ref 0.7–4.0)
LYMPHS PCT: 29 %
MCH: 29.7 pg (ref 26.0–34.0)
MCHC: 32.4 g/dL (ref 30.0–36.0)
MCV: 91.7 fL (ref 80.0–100.0)
MONO ABS: 0.9 10*3/uL (ref 0.1–1.0)
Monocytes Relative: 9 %
Neutro Abs: 5.7 10*3/uL (ref 1.7–7.7)
Neutrophils Relative %: 60 %
Platelet Count: 232 10*3/uL (ref 150–400)
RBC: 4.92 MIL/uL (ref 4.22–5.81)
RDW: 15.4 % (ref 11.5–15.5)
WBC: 9.5 10*3/uL (ref 4.0–10.5)
nRBC: 0 % (ref 0.0–0.2)

## 2018-04-25 MED ORDER — DOXORUBICIN HCL LIPOSOMAL CHEMO INJECTION 2 MG/ML
20.0000 mg/m2 | Freq: Once | INTRAVENOUS | Status: AC
  Administered 2018-04-25: 36 mg via INTRAVENOUS
  Filled 2018-04-25: qty 18

## 2018-04-25 MED ORDER — ONDANSETRON HCL 8 MG PO TABS
ORAL_TABLET | ORAL | Status: AC
  Filled 2018-04-25: qty 1

## 2018-04-25 MED ORDER — DEXAMETHASONE 4 MG PO TABS
ORAL_TABLET | ORAL | Status: AC
  Filled 2018-04-25: qty 2

## 2018-04-25 MED ORDER — DEXAMETHASONE SODIUM PHOSPHATE 10 MG/ML IJ SOLN
10.0000 mg | Freq: Once | INTRAMUSCULAR | Status: AC
  Administered 2018-04-25: 10 mg via INTRAVENOUS

## 2018-04-25 MED ORDER — DEXTROSE 5 % IV SOLN
Freq: Once | INTRAVENOUS | Status: AC
  Administered 2018-04-25: 12:00:00 via INTRAVENOUS
  Filled 2018-04-25: qty 250

## 2018-04-25 MED ORDER — ONDANSETRON HCL 8 MG PO TABS
8.0000 mg | ORAL_TABLET | Freq: Once | ORAL | Status: AC
  Administered 2018-04-25: 8 mg via ORAL

## 2018-04-25 MED ORDER — DEXAMETHASONE SODIUM PHOSPHATE 10 MG/ML IJ SOLN
INTRAMUSCULAR | Status: AC
  Filled 2018-04-25: qty 1

## 2018-04-25 NOTE — Patient Instructions (Signed)
[facility] Discharge Instructions for Patients Receiving Chemotherapy  Today you received the following chemotherapy agents Doxil.   To help prevent nausea and vomiting after your treatment, we encourage you to take your nausea medication as prescribed.    If you develop nausea and vomiting that is not controlled by your nausea medication, call the clinic.   BELOW ARE SYMPTOMS THAT SHOULD BE REPORTED IMMEDIATELY:  *FEVER GREATER THAN 100.5 F  *CHILLS WITH OR WITHOUT FEVER  NAUSEA AND VOMITING THAT IS NOT CONTROLLED WITH YOUR NAUSEA MEDICATION  *UNUSUAL SHORTNESS OF BREATH  *UNUSUAL BRUISING OR BLEEDING  TENDERNESS IN MOUTH AND THROAT WITH OR WITHOUT PRESENCE OF ULCERS  *URINARY PROBLEMS  *BOWEL PROBLEMS  UNUSUAL RASH Items with * indicate a potential emergency and should be followed up as soon as possible.  Feel free to call the clinic should you have any questions or concerns. The clinic phone number is [phone].  Please show the CHEMO ALERT CARD at check-in to the Emergency Department and triage nurse.   

## 2018-04-25 NOTE — Progress Notes (Signed)
Hematology and Oncology Follow Up Visit  Todd Vincent [id number] 12-26-1975 42 y.o. 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 He has had some fatigue as well as nausea without vomiting. He takes the Zofran as needed which helps. He was seen by surgery last week on Thursday and will be having a CT of the abdomen and pelvis in the next few weeks to evaluate his healing. He states that he will also be having an MRI in April for follow-up.  No fever, chills, cough, rash, dizziness, SOB, chest pain, palpitations, abdominal pain or changes in bowel or bladder habits.  No episodes of bleeding, no bruising or petechiae.  The swelling in his lower extremities remains stable. Pedal pulses are 2+.  The neuropathy in his hands and feet is unchanged.  No falls or syncopal episodes.  No lymphadenopathy noted on exam.  His appetite comes and goes. He still drinks several protein shakes a day. He is staying well hydrated. His weight is stable.   ECOG Performance Status: 1 - Symptomatic but completely ambulatory  Medications:  Allergies as of 04/25/2018      Reactions   Sulfa Antibiotics Rash, Itching   Extreme itching    Sulfasalazine Rash   Extreme itching       Medication List        Accurate as of 04/25/18 12:05 PM. Always use your most recent med list.          acyclovir 800 MG tablet Commonly known as:  ZOVIRAX Take 800 mg by mouth 5 (five) times daily.   citalopram 10 MG tablet Commonly known as:  CELEXA TAKE 1 TABLET(10 MG) BY MOUTH DAILY   COCONUT OIL PO Take 1,000 mg by mouth 2 (two) times daily.   darunavir 800 MG tablet Commonly known as:  PREZISTA TAKE 1 TABLET BY MOUTH EVERY DAY WITH BREAKFAST     dronabinol 5 MG capsule Commonly known as:  MARINOL TAKE 1 CAPSULE BY MOUTH TWICE DAILY BEFORE LUNCH AND SUPPER   dronabinol 5 MG capsule Commonly known as:  MARINOL TAKE 1 CAPSULE BY MOUTH TWICE DAILY BEFORE LUNCH AND SUPPER   elvitegravir-cobicistat-emtricitabine-tenofovir 150-150-200-10 MG Tabs tablet Commonly known as:  GENVOYA Take 1 tablet by mouth daily.   LORazepam 0.5 MG tablet Commonly known as:  ATIVAN TAKE 1 BY MOUTH EVERY 6 HOURS AS NEEDED   meclizine 25 MG tablet Commonly known as:  ANTIVERT Take 1 tablet (25 mg total) by mouth every 6 (six) hours as needed for dizziness.   ondansetron 8 MG tablet Commonly known as:  ZOFRAN Take 1 tablet (8 mg total) by mouth 2 (two) times daily. Start the day after chemo for 2 days. Then take as needed for nausea or vomiting.   pregabalin 75 MG capsule Commonly known as:  LYRICA Take 1 capsule (75 mg total) by mouth 2 (two) times daily.   temazepam 30 MG capsule Commonly known as:  RESTORIL Take 1 capsule (30 mg total) by mouth at bedtime as needed for sleep.   triamterene-hydrochlorothiazide 37.5-25 MG tablet Commonly known as:  MAXZIDE-25 Take 1 tablet by mouth daily.   valACYclovir 1000 MG tablet Commonly known as:  VALTREX TAKE 1 TABLET BY MOUTH AS NEEDED       Allergies:  Allergies  Allergen Reactions  . Sulfa Antibiotics Rash and Itching    Extreme itching   . Sulfasalazine Rash    Extreme itching     Past Medical History, Surgical history, Social history, and Family History were reviewed and updated.  Review of Systems: All other 10 point review of systems is negative.   Physical Exam:  vitals were not taken for this visit.   Wt Readings from Last 3 Encounters:  02/21/18 155 lb (70.3 kg)  01/24/18 156 lb (70.8 kg)  01/10/18 158 lb 12.8 oz (72 kg)    Ocular: Sclerae unicteric, pupils equal, round and reactive to light Ear-nose-throat: Oropharynx clear, dentition fair Lymphatic: No cervical,  supraclavicular or axillary adenopathy Lungs no rales or rhonchi, good excursion bilaterally Heart regular rate and rhythm, no murmur appreciated Abd soft, nontender, positive bowel sounds, no liver or spleen tip palpated on exam, no fluid wave  MSK no focal spinal tenderness, no joint edema Neuro: non-focal, well-oriented, appropriate affect Breasts: Deferred   Lab Results  Component Value Date   WBC 9.5 04/25/2018   HGB 14.6 04/25/2018   HCT 45.1 04/25/2018   MCV 91.7 04/25/2018   PLT 232 04/25/2018   No results found for: FERRITIN, IRON, TIBC, UIBC, IRONPCTSAT Lab Results  Component Value Date   RBC 4.92 04/25/2018   No results found for: KPAFRELGTCHN, LAMBDASER, KAPLAMBRATIO No results found for: IGGSERUM, IGA, IGMSERUM No results found for: Odetta Pink, SPEI   Chemistry      Component Value Date/Time   NA 136 04/25/2018 1055   NA 141 04/19/2017 1007   NA 141 12/13/2015 0827   K 4.2 04/25/2018 1055   K 3.4 04/19/2017 1007   K 4.0 12/13/2015 0827   CL 101 04/25/2018 1055   CL 105 04/19/2017 1007   CO2 26 04/25/2018 1055   CO2 27 04/19/2017 1007   CO2 26 12/13/2015 0827   BUN 6 04/25/2018 1055   BUN 8 04/19/2017 1007   BUN 8.2 12/13/2015 0827   CREATININE 1.12 04/25/2018 1055   CREATININE 1.3 (H) 04/19/2017 1007   CREATININE 1.2 12/13/2015 0827      Component Value Date/Time   CALCIUM 8.9 04/25/2018 1055   CALCIUM 9.3 04/19/2017 1007   CALCIUM 9.2 12/13/2015 0827   ALKPHOS 80 04/25/2018 1055   ALKPHOS 82 04/19/2017 1007   ALKPHOS 78 12/13/2015 0827   AST 15 04/25/2018 1055   AST 14 12/13/2015 0827   ALT 15 04/25/2018 1055   ALT 23 04/19/2017 1007   ALT 13 12/13/2015 0827   BILITOT 0.4 04/25/2018 1055   BILITOT 0.40 12/13/2015 0827       Impression and Plan: Todd Vincent is a very pleasant 42 yo African American gentleman with Kaposi's sarcoma as well as history of rectal cancer.  He is doing well on  Doxil and we will proceed with treatment today as planned.  He recently followed up with his surgeon and will be having a CT scan to evaluate his response to surgery.  We will plan to see him back in another month.  He will contact our office with any questions or concerns. We can certainly see his sooner if need be.   Laverna Peace, NP 12/9/201912:05 PM

## 2018-04-25 NOTE — Telephone Encounter (Signed)
Appointments scheduled avs/calendar printed per 12/9 los °

## 2018-04-26 LAB — T-HELPER CELLS (CD4) COUNT (NOT AT ARMC)
CD4 % Helper T Cell: 14 % — ABNORMAL LOW (ref 33–55)
CD4 T CELL ABS: 370 /uL — AB (ref 400–2700)

## 2018-04-29 ENCOUNTER — Other Ambulatory Visit: Payer: Self-pay | Admitting: Internal Medicine

## 2018-04-29 ENCOUNTER — Encounter: Payer: Self-pay | Admitting: Family

## 2018-04-29 DIAGNOSIS — B20 Human immunodeficiency virus [HIV] disease: Secondary | ICD-10-CM

## 2018-04-29 MED ORDER — ELVITEG-COBIC-EMTRICIT-TENOFAF 150-150-200-10 MG PO TABS: 1.0000 | ORAL_TABLET | Freq: Every day | ORAL | 0 refills | Status: DC

## 2018-05-06 DIAGNOSIS — R599 Enlarged lymph nodes, unspecified: Secondary | ICD-10-CM | POA: Diagnosis not present

## 2018-05-06 DIAGNOSIS — K9189 Other postprocedural complications and disorders of digestive system: Secondary | ICD-10-CM | POA: Diagnosis not present

## 2018-05-06 DIAGNOSIS — C210 Malignant neoplasm of anus, unspecified: Secondary | ICD-10-CM | POA: Diagnosis not present

## 2018-05-06 DIAGNOSIS — N3289 Other specified disorders of bladder: Secondary | ICD-10-CM | POA: Diagnosis not present

## 2018-05-06 DIAGNOSIS — N400 Enlarged prostate without lower urinary tract symptoms: Secondary | ICD-10-CM | POA: Diagnosis not present

## 2018-05-11 ENCOUNTER — Other Ambulatory Visit: Payer: Self-pay | Admitting: Hematology & Oncology

## 2018-05-11 DIAGNOSIS — C469 Kaposi's sarcoma, unspecified: Secondary | ICD-10-CM

## 2018-05-11 DIAGNOSIS — B029 Zoster without complications: Secondary | ICD-10-CM

## 2018-05-24 ENCOUNTER — Other Ambulatory Visit: Payer: Self-pay

## 2018-05-24 ENCOUNTER — Inpatient Hospital Stay (HOSPITAL_BASED_OUTPATIENT_CLINIC_OR_DEPARTMENT_OTHER): Payer: Medicare Other | Admitting: Hematology & Oncology

## 2018-05-24 ENCOUNTER — Encounter: Payer: Self-pay | Admitting: Hematology & Oncology

## 2018-05-24 ENCOUNTER — Inpatient Hospital Stay: Payer: Medicare Other | Attending: Hematology & Oncology

## 2018-05-24 ENCOUNTER — Inpatient Hospital Stay: Payer: Medicare Other

## 2018-05-24 VITALS — BP 106/64 | HR 88 | Temp 98.4°F | Resp 16 | Wt 152.5 lb

## 2018-05-24 DIAGNOSIS — C20 Malignant neoplasm of rectum: Secondary | ICD-10-CM

## 2018-05-24 DIAGNOSIS — M7989 Other specified soft tissue disorders: Secondary | ICD-10-CM

## 2018-05-24 DIAGNOSIS — G629 Polyneuropathy, unspecified: Secondary | ICD-10-CM

## 2018-05-24 DIAGNOSIS — C469 Kaposi's sarcoma, unspecified: Secondary | ICD-10-CM

## 2018-05-24 DIAGNOSIS — Z21 Asymptomatic human immunodeficiency virus [HIV] infection status: Secondary | ICD-10-CM | POA: Insufficient documentation

## 2018-05-24 DIAGNOSIS — Z5111 Encounter for antineoplastic chemotherapy: Secondary | ICD-10-CM | POA: Insufficient documentation

## 2018-05-24 LAB — CMP (CANCER CENTER ONLY)
ALK PHOS: 83 U/L (ref 38–126)
ALT: 13 U/L (ref 0–44)
AST: 15 U/L (ref 15–41)
Albumin: 3.8 g/dL (ref 3.5–5.0)
Anion gap: 5 (ref 5–15)
BUN: 6 mg/dL (ref 6–20)
CO2: 29 mmol/L (ref 22–32)
Calcium: 8.8 mg/dL — ABNORMAL LOW (ref 8.9–10.3)
Chloride: 104 mmol/L (ref 98–111)
Creatinine: 1.09 mg/dL (ref 0.61–1.24)
GFR, Estimated: >60 mL/min (ref >60)
GLUCOSE: 164 mg/dL — AB (ref 70–99)
Potassium: 4 mmol/L (ref 3.5–5.1)
Sodium: 138 mmol/L (ref 135–145)
Total Bilirubin: 0.3 mg/dL (ref 0.3–1.2)
Total Protein: 7.3 g/dL (ref 6.5–8.1)

## 2018-05-24 LAB — CBC WITH DIFFERENTIAL (CANCER CENTER ONLY)
Abs Immature Granulocytes: 0.05 10*3/uL (ref 0.00–0.07)
Basophils Absolute: 0 10*3/uL (ref 0.0–0.1)
Basophils Relative: 1 %
EOS PCT: 3 %
Eosinophils Absolute: 0.3 10*3/uL (ref 0.0–0.5)
HCT: 44.1 % (ref 39.0–52.0)
HEMOGLOBIN: 14.6 g/dL (ref 13.0–17.0)
Immature Granulocytes: 1 %
Lymphocytes Relative: 25 %
Lymphs Abs: 2.2 10*3/uL (ref 0.7–4.0)
MCH: 30.2 pg (ref 26.0–34.0)
MCHC: 33.1 g/dL (ref 30.0–36.0)
MCV: 91.3 fL (ref 80.0–100.0)
MONO ABS: 0.6 10*3/uL (ref 0.1–1.0)
Monocytes Relative: 7 %
Neutro Abs: 5.6 10*3/uL (ref 1.7–7.7)
Neutrophils Relative %: 63 %
Platelet Count: 370 10*3/uL (ref 150–400)
RBC: 4.83 MIL/uL (ref 4.22–5.81)
RDW: 14.6 % (ref 11.5–15.5)
WBC Count: 8.8 10*3/uL (ref 4.0–10.5)
nRBC: 0 % (ref 0.0–0.2)

## 2018-05-24 MED ORDER — DOXORUBICIN HCL LIPOSOMAL CHEMO INJECTION 2 MG/ML
20.0000 mg/m2 | Freq: Once | INTRAVENOUS | Status: AC
  Administered 2018-05-24: 36 mg via INTRAVENOUS
  Filled 2018-05-24: qty 18

## 2018-05-24 MED ORDER — ONDANSETRON HCL 8 MG PO TABS
8.0000 mg | ORAL_TABLET | Freq: Once | ORAL | Status: AC
  Administered 2018-05-24: 8 mg via ORAL

## 2018-05-24 MED ORDER — DEXAMETHASONE SODIUM PHOSPHATE 10 MG/ML IJ SOLN
INTRAMUSCULAR | Status: AC
  Filled 2018-05-24: qty 1

## 2018-05-24 MED ORDER — DEXAMETHASONE SODIUM PHOSPHATE 10 MG/ML IJ SOLN
10.0000 mg | Freq: Once | INTRAMUSCULAR | Status: AC
  Administered 2018-05-24: 10 mg via INTRAVENOUS

## 2018-05-24 MED ORDER — DEXTROSE 5 % IV SOLN
Freq: Once | INTRAVENOUS | Status: AC
  Administered 2018-05-24: 13:00:00 via INTRAVENOUS
  Filled 2018-05-24: qty 250

## 2018-05-24 MED ORDER — ONDANSETRON HCL 8 MG PO TABS
ORAL_TABLET | ORAL | Status: AC
  Filled 2018-05-24: qty 1

## 2018-05-24 NOTE — Progress Notes (Signed)
Hematology and Oncology Follow Up Visit  Todd Vincent [id number] 25-Apr-1976 43 y.o. 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  He is doing quite well.  He has had no problems with the Doxil.  He says he does have some neuropathy in his hands.  He is on Lyrica but he says he does not take this all that often.  I told him that he really needs to take this at nighttime and this might help with some of the discomfort that he has.  He has had no issues with his rectum.  He has had no problems healing up from the rectal surgery.  He does have a colostomy that he uses.  He does complain of little bit of swelling in his legs.  I am not sure what might be going on with that.  He is not taking his diuretic that he has had.  His last echocardiogram was back in August.  He had a ejection fraction of 55-60%.  He had an MRI of his pelvis on 05/06/2018.  This showed the total colectomy.  There was no obvious recurrent disease.  He had minimal iliac adenopathy.  He had a enlarged prostate.  He says his skin lesions are getting better.  He has had no cough.  There is no shortness of breath.  He has had no abdominal discomfort.  He has had no bleeding.  Thankfully, there is been no problems with his HIV.  He is followed closely by infectious disease for this.  Overall, his performance status is ECOG 1.    Medications:  Allergies as of 05/24/2018      Reactions   Sulfa Antibiotics Rash, Itching   Extreme itching    Sulfasalazine Rash   Extreme itching       Medication List       Accurate as of May 24, 2018 12:08 PM. Always use your most recent med list.        acyclovir 800 MG tablet Commonly known as:  ZOVIRAX Take  800 mg by mouth 5 (five) times daily.   citalopram 10 MG tablet Commonly known as:  CELEXA TAKE 1 TABLET(10 MG) BY MOUTH DAILY   COCONUT OIL PO Take 1,000 mg by mouth 2 (two) times daily.   dronabinol 5 MG capsule Commonly known as:  MARINOL TAKE 1 CAPSULE BY MOUTH TWICE DAILY BEFORE LUNCH AND SUPPER   dronabinol 5 MG capsule Commonly known as:  MARINOL TAKE 1 CAPSULE BY MOUTH TWICE DAILY BEFORE LUNCH AND SUPPER   elvitegravir-cobicistat-emtricitabine-tenofovir 150-150-200-10 MG Tabs tablet Commonly known as:  GENVOYA Take 1 tablet by mouth daily.   LORazepam 0.5 MG tablet Commonly known as:  ATIVAN TAKE 1 BY MOUTH EVERY 6 HOURS AS NEEDED   meclizine 25 MG tablet Commonly known as:  ANTIVERT Take 1 tablet (25 mg total) by mouth every 6 (six) hours as needed for dizziness.   ondansetron 8 MG tablet Commonly known as:  ZOFRAN Take 1 tablet (8 mg total) by mouth 2 (two) times daily. Start the day after chemo for 2 days. Then take as needed for nausea or vomiting.   pregabalin 75 MG capsule Commonly known as:  LYRICA Take 1 capsule (75 mg total) by mouth 2 (two) times daily.  PREZISTA 800 MG tablet Generic drug:  darunavir TAKE 1 TABLET BY MOUTH EVERY DAY IN THE MORNING WITH BREAKFAST   temazepam 30 MG capsule Commonly known as:  RESTORIL Take 1 capsule (30 mg total) by mouth at bedtime as needed for sleep.   triamterene-hydrochlorothiazide 37.5-25 MG tablet Commonly known as:  MAXZIDE-25 Take 1 tablet by mouth daily.   valACYclovir 1000 MG tablet Commonly known as:  VALTREX TAKE 1 TABLET BY MOUTH AS NEEDED       Allergies:  Allergies  Allergen Reactions  . Sulfa Antibiotics Rash and Itching    Extreme itching   . Sulfasalazine Rash    Extreme itching     Past Medical History, Surgical history, Social history, and Family History were reviewed and updated.  Review of Systems: Review of Systems  Constitutional: Negative.   HENT: Negative.   Eyes:  Negative.   Respiratory: Negative.   Cardiovascular: Positive for leg swelling.  Gastrointestinal: Negative.   Genitourinary: Negative.   Musculoskeletal: Positive for myalgias.  Skin: Negative.   Neurological: Positive for tingling.  Endo/Heme/Allergies: Negative.   Psychiatric/Behavioral: Negative.       Physical Exam:  weight is 152 lb 8 oz (69.2 kg). His oral temperature is 98.4 F (36.9 C). His blood pressure is 106/64 and his pulse is 88. His respiration is 16 and oxygen saturation is 100%.   Wt Readings from Last 3 Encounters:  05/24/18 152 lb 8 oz (69.2 kg)  04/25/18 153 lb (69.4 kg)  02/21/18 155 lb (70.3 kg)    Physical Exam Vitals signs reviewed.  HENT:     Head: Normocephalic and atraumatic.  Eyes:     Pupils: Pupils are equal, round, and reactive to light.  Neck:     Musculoskeletal: Normal range of motion.  Cardiovascular:     Rate and Rhythm: Normal rate and regular rhythm.     Heart sounds: Normal heart sounds.  Pulmonary:     Effort: Pulmonary effort is normal.     Breath sounds: Normal breath sounds.  Abdominal:     General: Bowel sounds are normal.     Palpations: Abdomen is soft.  Musculoskeletal: Normal range of motion.        General: No tenderness or deformity.  Lymphadenopathy:     Cervical: No cervical adenopathy.  Skin:    General: Skin is warm and dry.     Findings: No erythema or rash.  Neurological:     Mental Status: He is alert and oriented to person, place, and time.  Psychiatric:        Behavior: Behavior normal.        Thought Content: Thought content normal.        Judgment: Judgment normal.      Lab Results  Component Value Date   WBC 8.8 05/24/2018   HGB 14.6 05/24/2018   HCT 44.1 05/24/2018   MCV 91.3 05/24/2018   PLT 370 05/24/2018   No results found for: FERRITIN, IRON, TIBC, UIBC, IRONPCTSAT Lab Results  Component Value Date   RBC 4.83 05/24/2018   No results found for: KPAFRELGTCHN, LAMBDASER,  KAPLAMBRATIO No results found for: IGGSERUM, IGA, IGMSERUM No results found for: Odetta Pink, SPEI   Chemistry      Component Value Date/Time   NA 138 05/24/2018 1127   NA 141 04/19/2017 1007   NA 141 12/13/2015 0827   K 4.0 05/24/2018 1127   K 3.4 04/19/2017 1007  K 4.0 12/13/2015 0827   CL 104 05/24/2018 1127   CL 105 04/19/2017 1007   CO2 29 05/24/2018 1127   CO2 27 04/19/2017 1007   CO2 26 12/13/2015 0827   BUN 6 05/24/2018 1127   BUN 8 04/19/2017 1007   BUN 8.2 12/13/2015 0827   CREATININE 1.09 05/24/2018 1127   CREATININE 1.3 (H) 04/19/2017 1007   CREATININE 1.2 12/13/2015 0827      Component Value Date/Time   CALCIUM 8.8 (L) 05/24/2018 1127   CALCIUM 9.3 04/19/2017 1007   CALCIUM 9.2 12/13/2015 0827   ALKPHOS 83 05/24/2018 1127   ALKPHOS 82 04/19/2017 1007   ALKPHOS 78 12/13/2015 0827   AST 15 05/24/2018 1127   AST 14 12/13/2015 0827   ALT 13 05/24/2018 1127   ALT 23 04/19/2017 1007   ALT 13 12/13/2015 0827   BILITOT 0.3 05/24/2018 1127   BILITOT 0.40 12/13/2015 0827       Impression and Plan: Mr. Banka is a very pleasant 43 yo African American gentleman with Kaposi's sarcoma as well as history of rectal cancer.   He is doing well on Doxil and we will proceed with treatment today as planned.   We will have him come back in another month for treatment.  As always, he responds nicely to Doxil.  [name], MD 1/7/202012:08 PM

## 2018-05-24 NOTE — Patient Instructions (Addendum)
Doxorubicin Liposomal injection  What is this medicine?  LIPOSOMAL DOXORUBICIN (LIP oh som al dox oh ROO bi sin) is a chemotherapy drug. This medicine is used to treat many kinds of cancer like Kaposi's sarcoma, multiple myeloma, and ovarian cancer.  This medicine may be used for other purposes; ask your health care provider or pharmacist if you have questions.  COMMON BRAND NAME(S): Doxil, Lipodox  What should I tell my health care provider before I take this medicine?  They need to know if you have any of these conditions:  -blood disorders  -heart disease  -infection (especially a virus infection such as chickenpox, cold sores, or herpes)  -liver disease  -recent or ongoing radiation therapy  -an unusual or allergic reaction to doxorubicin, other chemotherapy agents, soybeans, other medicines, foods, dyes, or preservatives  -pregnant or trying to get pregnant  -breast-feeding  How should I use this medicine?  This drug is given as an infusion into a vein. It is administered in a hospital or clinic by a specially trained health care professional. If you have pain, swelling, burning or any unusual feeling around the site of your injection, tell your health care professional right away.  Talk to your pediatrician regarding the use of this medicine in children. Special care may be needed.  Overdosage: If you think you have taken too much of this medicine contact a poison control center or emergency room at once.  NOTE: This medicine is only for you. Do not share this medicine with others.  What if I miss a dose?  It is important not to miss your dose. Call your doctor or health care professional if you are unable to keep an appointment.  What may interact with this medicine?  Do not take this medicine with any of the following medications:  -zidovudine  This medicine may also interact with the following medications:  -medicines to increase blood counts like filgrastim, pegfilgrastim, sargramostim  -vaccines  Talk to  your doctor or health care professional before taking any of these medicines:  -acetaminophen  -aspirin  -ibuprofen  -ketoprofen  -naproxen  This list may not describe all possible interactions. Give your health care provider a list of all the medicines, herbs, non-prescription drugs, or dietary supplements you use. Also tell them if you smoke, drink alcohol, or use illegal drugs. Some items may interact with your medicine.  What should I watch for while using this medicine?  Your condition will be monitored carefully while you are receiving this medicine. You may need blood work done while you are taking this medicine.  This drug may make you feel generally unwell. This is not uncommon, as chemotherapy can affect healthy cells as well as cancer cells. Report any side effects. Continue your course of treatment even though you feel ill unless your doctor tells you to stop.  Your urine may turn orange-red for a few days after your dose. This is not blood. If your urine is dark or brown, call your doctor.  In some cases, you may be given additional medicines to help with side effects. Follow all directions for their use.  Talk to your doctor about your risk of cancer. You may be more at risk for certain types of cancers if you take this medicine.  Do not become pregnant while taking this medicine or for 6 months after stopping it. Women should inform their healthcare professional if they wish to become pregnant or think they may be pregnant. Men should not father   while taking this medicine. This medicine has caused ovarian failure in some women. This medicine may make it more difficult to get pregnant. Talk to your healthcare professional if you are concerned about your  fertility. This medicine has caused decreased sperm counts in some men. This may make it more difficult to father a child. Talk to your healthcare professional if you are concerned about your fertility. This medicine may cause a decrease in Co-Enzyme Q-10. You should make sure that you get enough Co-Enzyme Q-10 while you are taking this medicine. Discuss the foods you eat and the vitamins you take with your health care professional. What side effects may I notice from receiving this medicine? Side effects that you should report to your doctor or health care professional as soon as possible: -allergic reactions like skin rash, itching or hives, swelling of the face, lips, or tongue -low blood counts - this medicine may decrease the number of white blood cells, red blood cells and platelets. You may be at increased risk for infections and bleeding. -signs of hand-foot syndrome - tingling or burning, redness, flaking, swelling, small blisters, or small sores on the palms of your hands or the soles of your feet -signs of infection - fever or chills, cough, sore throat, pain or difficulty passing urine -signs of decreased platelets or bleeding - bruising, pinpoint red spots on the skin, black, tarry stools, blood in the urine -signs of decreased red blood cells - unusually weak or tired, fainting spells, lightheadedness -back pain, chills, facial flushing, fever, headache, tightness in the chest or throat during the infusion -breathing problems -chest pain -fast, irregular heartbeat -mouth pain, redness, sores -pain, swelling, redness at site where injected -pain, tingling, numbness in the hands or feet -swelling of ankles, feet, or hands -vomiting Side effects that usually do not require medical attention (report to your doctor or health care professional if they continue or are bothersome): -diarrhea -hair loss -loss of appetite -nail discoloration or damage -nausea -red or watery eyes -red  colored urine -stomach upset This list may not describe all possible side effects. Call your doctor for medical advice about side effects. You may report side effects to FDA at 1-800-FDA-1088. Where should I keep my medicine? This drug is given in a hospital or clinic and will not be stored at home. NOTE: This sheet is a summary. It may not cover all possible information. If you have questions about this medicine, talk to your doctor, pharmacist, or health care provider.  2019 Elsevier/Gold Standard (2018-01-10 15:13:26) Ondansetron tablets What is this medicine? ONDANSETRON (on DAN se tron) is used to treat nausea and vomiting caused by chemotherapy. It is also used to prevent or treat nausea and vomiting after surgery. This medicine may be used for other purposes; ask your health care provider or pharmacist if you have questions. COMMON BRAND NAME(S): Zofran What should I tell my health care provider before I take this medicine? They need to know if you have any of these conditions: -heart disease -history of irregular heartbeat -liver disease -low levels of magnesium or potassium in the blood -an unusual or allergic reaction to ondansetron, granisetron, other medicines, foods, dyes, or preservatives -pregnant or trying to get pregnant -breast-feeding How should I use this medicine? Take this medicine by mouth with a glass of water. Follow the directions on your prescription label. Take your doses at regular intervals. Do not take your medicine more often than directed. Talk to your pediatrician regarding the use of this  medicine in children. Special care may be needed. Overdosage: If you think you have taken too much of this medicine contact a poison control center or emergency room at once. NOTE: This medicine is only for you. Do not share this medicine with others. What if I miss a dose? If you miss a dose, take it as soon as you can. If it is almost time for your next dose, take  only that dose. Do not take double or extra doses. What may interact with this medicine? Do not take this medicine with any of the following medications: -apomorphine -certain medicines for fungal infections like fluconazole, itraconazole, ketoconazole, posaconazole, voriconazole -cisapride -dofetilide -dronedarone -pimozide -thioridazine -ziprasidone This medicine may also interact with the following medications: -carbamazepine -certain medicines for depression, anxiety, or psychotic disturbances -fentanyl -linezolid -MAOIs like Carbex, Eldepryl, Marplan, Nardil, and Parnate -methylene blue (injected into a vein) -other medicines that prolong the QT interval (cause an abnormal heart rhythm) -phenytoin -rifampicin -tramadol This list may not describe all possible interactions. Give your health care provider a list of all the medicines, herbs, non-prescription drugs, or dietary supplements you use. Also tell them if you smoke, drink alcohol, or use illegal drugs. Some items may interact with your medicine. What should I watch for while using this medicine? Check with your doctor or health care professional right away if you have any sign of an allergic reaction. What side effects may I notice from receiving this medicine? Side effects that you should report to your doctor or health care professional as soon as possible: -allergic reactions like skin rash, itching or hives, swelling of the face, lips or tongue -breathing problems -confusion -dizziness -fast or irregular heartbeat -feeling faint or lightheaded, falls -fever and chills -loss of balance or coordination -seizures -sweating -swelling of the hands or feet -tightness in the chest -tremors -unusually weak or tired Side effects that usually do not require medical attention (report to your doctor or health care professional if they continue or are bothersome): -constipation or diarrhea -headache This list may not  describe all possible side effects. Call your doctor for medical advice about side effects. You may report side effects to FDA at 1-800-FDA-1088. Where should I keep my medicine? Keep out of the reach of children. Store between 2 and 30 degrees C (36 and 86 degrees F). Throw away any unused medicine after the expiration date. NOTE: This sheet is a summary. It may not cover all possible information. If you have questions about this medicine, talk to your doctor, pharmacist, or health care provider.  2019 Elsevier/Gold Standard (2013-02-08 16:27:45) Dexamethasone injection What is this medicine? DEXAMETHASONE (dex a METH a sone) is a corticosteroid. It is used to treat inflammation of the skin, joints, lungs, and other organs. Common conditions treated include asthma, allergies, and arthritis. It is also used for other conditions, like blood disorders and diseases of the adrenal glands. This medicine may be used for other purposes; ask your health care provider or pharmacist if you have questions. COMMON BRAND NAME(S): Decadron, DoubleDex, Simplist Dexamethasone, Solurex What should I tell my health care provider before I take this medicine? They need to know if you have any of these conditions: -blood clotting problems -Cushing's syndrome -diabetes -glaucoma -heart problems or disease -high blood pressure -infection like herpes, measles, tuberculosis, or chickenpox -kidney disease -liver disease -mental problems -myasthenia gravis -osteoporosis -previous heart attack -seizures -stomach, ulcer or intestine disease including colitis and diverticulitis -thyroid problem -an unusual or allergic reaction to  dexamethasone, corticosteroids, other medicines, lactose, foods, dyes, or preservatives -pregnant or trying to get pregnant -breast-feeding How should I use this medicine? This medicine is for injection into a muscle, joint, lesion, soft tissue, or vein. It is given by a health care  professional in a hospital or clinic setting. Talk to your pediatrician regarding the use of this medicine in children. Special care may be needed. Overdosage: If you think you have taken too much of this medicine contact a poison control center or emergency room at once. NOTE: This medicine is only for you. Do not share this medicine with others. What if I miss a dose? This may not apply. If you are having a series of injections over a prolonged period, try not to miss an appointment. Call your doctor or health care professional to reschedule if you are unable to keep an appointment. What may interact with this medicine? Do not take this medicine with any of the following medications: -mifepristone, RU-486 -vaccines This medicine may also interact with the following medications: -amphotericin B -antibiotics like clarithromycin, erythromycin, and troleandomycin -aspirin and aspirin-like drugs -barbiturates like phenobarbital -carbamazepine -cholestyramine -cholinesterase inhibitors like donepezil, galantamine, rivastigmine, and tacrine -cyclosporine -digoxin -diuretics -ephedrine -male hormones, like estrogens or progestins and birth control pills -indinavir -isoniazid -ketoconazole -medicines for diabetes -medicines that improve muscle tone or strength for conditions like myasthenia gravis -NSAIDs, medicines for pain and inflammation, like ibuprofen or naproxen -phenytoin -rifampin -thalidomide -warfarin This list may not describe all possible interactions. Give your health care provider a list of all the medicines, herbs, non-prescription drugs, or dietary supplements you use. Also tell them if you smoke, drink alcohol, or use illegal drugs. Some items may interact with your medicine. What should I watch for while using this medicine? Your condition will be monitored carefully while you are receiving this medicine. If you are taking this medicine for a long time, carry an  identification card with your name and address, the type and dose of your medicine, and your doctor's name and address. This medicine may increase your risk of getting an infection. Stay away from people who are sick. Tell your doctor or health care professional if you are around anyone with measles or chickenpox. Talk to your health care provider before you get any vaccines that you take this medicine. If you are going to have surgery, tell your doctor or health care professional that you have taken this medicine within the last twelve months. Ask your doctor or health care professional about your diet. You may need to lower the amount of salt you eat. The medicine can increase your blood sugar. If you are a diabetic check with your doctor if you need help adjusting the dose of your diabetic medicine. What side effects may I notice from receiving this medicine? Side effects that you should report to your doctor or health care professional as soon as possible: -allergic reactions like skin rash, itching or hives, swelling of the face, lips, or tongue -black or tarry stools -change in the amount of urine -changes in vision -confusion, excitement, restlessness, a false sense of well-being -fever, sore throat, sneezing, cough, or other signs of infection, wounds that will not heal -hallucinations -increased thirst -mental depression, mood swings, mistaken feelings of self importance or of being mistreated -pain in hips, back, ribs, arms, shoulders, or legs -pain, redness, or irritation at the injection site -redness, blistering, peeling or loosening of the skin, including inside the mouth -rounding out of face -swelling of  feet or lower legs -unusual bleeding or bruising -unusual tired or weak -wounds that do not heal Side effects that usually do not require medical attention (report to your doctor or health care professional if they continue or are bothersome): -diarrhea or  constipation -change in taste -headache -nausea, vomiting -skin problems, acne, thin and shiny skin -touble sleeping -unusual growth of hair on the face or body -weight gain This list may not describe all possible side effects. Call your doctor for medical advice about side effects. You may report side effects to FDA at 1-800-FDA-1088. Where should I keep my medicine? This drug is given in a hospital or clinic and will not be stored at home. NOTE: This sheet is a summary. It may not cover all possible information. If you have questions about this medicine, talk to your doctor, pharmacist, or health care provider.  2019 Elsevier/Gold Standard (2007-08-25 14:04:12) Doxorubicin Liposomal injection What is this medicine? LIPOSOMAL DOXORUBICIN (LIP oh som al dox oh ROO bi sin) is a chemotherapy drug. This medicine is used to treat many kinds of cancer like Kaposi's sarcoma, multiple myeloma, and ovarian cancer. This medicine may be used for other purposes; ask your health care provider or pharmacist if you have questions. COMMON BRAND NAME(S): Doxil, Lipodox What should I tell my health care provider before I take this medicine? They need to know if you have any of these conditions: -blood disorders -heart disease -infection (especially a virus infection such as chickenpox, cold sores, or herpes) -liver disease -recent or ongoing radiation therapy -an unusual or allergic reaction to doxorubicin, other chemotherapy agents, soybeans, other medicines, foods, dyes, or preservatives -pregnant or trying to get pregnant -breast-feeding How should I use this medicine? This drug is given as an infusion into a vein. It is administered in a hospital or clinic by a specially trained health care professional. If you have pain, swelling, burning or any unusual feeling around the site of your injection, tell your health care professional right away. Talk to your pediatrician regarding the use of this  medicine in children. Special care may be needed. Overdosage: If you think you have taken too much of this medicine contact a poison control center or emergency room at once. NOTE: This medicine is only for you. Do not share this medicine with others. What if I miss a dose? It is important not to miss your dose. Call your doctor or health care professional if you are unable to keep an appointment. What may interact with this medicine? Do not take this medicine with any of the following medications: -zidovudine This medicine may also interact with the following medications: -medicines to increase blood counts like filgrastim, pegfilgrastim, sargramostim -vaccines Talk to your doctor or health care professional before taking any of these medicines: -acetaminophen -aspirin -ibuprofen -ketoprofen -naproxen This list may not describe all possible interactions. Give your health care provider a list of all the medicines, herbs, non-prescription drugs, or dietary supplements you use. Also tell them if you smoke, drink alcohol, or use illegal drugs. Some items may interact with your medicine. What should I watch for while using this medicine? Your condition will be monitored carefully while you are receiving this medicine. You may need blood work done while you are taking this medicine. This drug may make you feel generally unwell. This is not uncommon, as chemotherapy can affect healthy cells as well as cancer cells. Report any side effects. Continue your course of treatment even though you feel ill unless your  doctor tells you to stop. Your urine may turn orange-red for a few days after your dose. This is not blood. If your urine is dark or brown, call your doctor. In some cases, you may be given additional medicines to help with side effects. Follow all directions for their use. Talk to your doctor about your risk of cancer. You may be more at risk for certain types of cancers if you take this  medicine. Do not become pregnant while taking this medicine or for 6 months after stopping it. Women should inform their healthcare professional if they wish to become pregnant or think they may be pregnant. Men should not father a child while taking this medicine and for 6 months after stopping it. There is a potential for serious side effects to an unborn child. Talk to your health care professional or pharmacist for more information. Do not breast-feed an infant while taking this medicine. This medicine has caused ovarian failure in some women. This medicine may make it more difficult to get pregnant. Talk to your healthcare professional if you are concerned about your fertility. This medicine has caused decreased sperm counts in some men. This may make it more difficult to father a child. Talk to your healthcare professional if you are concerned about your fertility. This medicine may cause a decrease in Co-Enzyme Q-10. You should make sure that you get enough Co-Enzyme Q-10 while you are taking this medicine. Discuss the foods you eat and the vitamins you take with your health care professional. What side effects may I notice from receiving this medicine? Side effects that you should report to your doctor or health care professional as soon as possible: -allergic reactions like skin rash, itching or hives, swelling of the face, lips, or tongue -low blood counts - this medicine may decrease the number of white blood cells, red blood cells and platelets. You may be at increased risk for infections and bleeding. -signs of hand-foot syndrome - tingling or burning, redness, flaking, swelling, small blisters, or small sores on the palms of your hands or the soles of your feet -signs of infection - fever or chills, cough, sore throat, pain or difficulty passing urine -signs of decreased platelets or bleeding - bruising, pinpoint red spots on the skin, black, tarry stools, blood in the urine -signs of  decreased red blood cells - unusually weak or tired, fainting spells, lightheadedness -back pain, chills, facial flushing, fever, headache, tightness in the chest or throat during the infusion -breathing problems -chest pain -fast, irregular heartbeat -mouth pain, redness, sores -pain, swelling, redness at site where injected -pain, tingling, numbness in the hands or feet -swelling of ankles, feet, or hands -vomiting Side effects that usually do not require medical attention (report to your doctor or health care professional if they continue or are bothersome): -diarrhea -hair loss -loss of appetite -nail discoloration or damage -nausea -red or watery eyes -red colored urine -stomach upset This list may not describe all possible side effects. Call your doctor for medical advice about side effects. You may report side effects to FDA at 1-800-FDA-1088. Where should I keep my medicine? This drug is given in a hospital or clinic and will not be stored at home. NOTE: This sheet is a summary. It may not cover all possible information. If you have questions about this medicine, talk to your doctor, pharmacist, or health care provider.  2019 Elsevier/Gold Standard (2018-01-10 15:13:26)

## 2018-05-25 LAB — T-HELPER CELLS (CD4) COUNT (NOT AT ARMC)
CD4 % Helper T Cell: 15 % — ABNORMAL LOW (ref 33–55)
CD4 T Cell Abs: 330 /uL — ABNORMAL LOW (ref 400–2700)

## 2018-06-05 ENCOUNTER — Encounter: Payer: Self-pay | Admitting: Family

## 2018-06-05 ENCOUNTER — Other Ambulatory Visit: Payer: Self-pay | Admitting: Hematology & Oncology

## 2018-06-06 ENCOUNTER — Other Ambulatory Visit: Payer: Self-pay | Admitting: Family

## 2018-06-06 DIAGNOSIS — M545 Low back pain: Secondary | ICD-10-CM | POA: Diagnosis not present

## 2018-06-06 DIAGNOSIS — N200 Calculus of kidney: Secondary | ICD-10-CM | POA: Diagnosis not present

## 2018-06-06 DIAGNOSIS — R3916 Straining to void: Secondary | ICD-10-CM | POA: Diagnosis not present

## 2018-06-06 DIAGNOSIS — C469 Kaposi's sarcoma, unspecified: Secondary | ICD-10-CM

## 2018-06-06 DIAGNOSIS — K611 Rectal abscess: Secondary | ICD-10-CM | POA: Diagnosis not present

## 2018-06-06 DIAGNOSIS — N400 Enlarged prostate without lower urinary tract symptoms: Secondary | ICD-10-CM | POA: Diagnosis not present

## 2018-06-06 DIAGNOSIS — Z5321 Procedure and treatment not carried out due to patient leaving prior to being seen by health care provider: Secondary | ICD-10-CM | POA: Diagnosis not present

## 2018-06-06 DIAGNOSIS — N3289 Other specified disorders of bladder: Secondary | ICD-10-CM | POA: Diagnosis not present

## 2018-06-06 DIAGNOSIS — R102 Pelvic and perineal pain: Secondary | ICD-10-CM | POA: Diagnosis not present

## 2018-06-06 DIAGNOSIS — R61 Generalized hyperhidrosis: Secondary | ICD-10-CM

## 2018-06-06 DIAGNOSIS — C20 Malignant neoplasm of rectum: Secondary | ICD-10-CM | POA: Diagnosis not present

## 2018-06-06 DIAGNOSIS — Z21 Asymptomatic human immunodeficiency virus [HIV] infection status: Secondary | ICD-10-CM | POA: Diagnosis not present

## 2018-06-06 DIAGNOSIS — Z85048 Personal history of other malignant neoplasm of rectum, rectosigmoid junction, and anus: Secondary | ICD-10-CM | POA: Diagnosis not present

## 2018-06-06 MED ORDER — GLYCOPYRRONIUM TOSYLATE 2.4 % EX PADS: 1.0000 "application " | MEDICATED_PAD | Freq: Every day | CUTANEOUS | 6 refills | Status: DC

## 2018-06-17 DIAGNOSIS — Z432 Encounter for attention to ileostomy: Secondary | ICD-10-CM | POA: Diagnosis not present

## 2018-06-20 ENCOUNTER — Ambulatory Visit: Payer: Medicare Other

## 2018-06-20 ENCOUNTER — Ambulatory Visit: Payer: Medicare Other | Admitting: Hematology & Oncology

## 2018-06-20 ENCOUNTER — Other Ambulatory Visit: Payer: Medicare Other

## 2018-06-21 DIAGNOSIS — N453 Epididymo-orchitis: Secondary | ICD-10-CM | POA: Diagnosis not present

## 2018-06-21 DIAGNOSIS — I861 Scrotal varices: Secondary | ICD-10-CM | POA: Diagnosis not present

## 2018-06-21 DIAGNOSIS — N433 Hydrocele, unspecified: Secondary | ICD-10-CM | POA: Diagnosis not present

## 2018-06-21 DIAGNOSIS — Z85038 Personal history of other malignant neoplasm of large intestine: Secondary | ICD-10-CM | POA: Diagnosis not present

## 2018-06-21 DIAGNOSIS — N50812 Left testicular pain: Secondary | ICD-10-CM | POA: Diagnosis not present

## 2018-06-21 DIAGNOSIS — Z21 Asymptomatic human immunodeficiency virus [HIV] infection status: Secondary | ICD-10-CM | POA: Diagnosis not present

## 2018-06-24 ENCOUNTER — Ambulatory Visit: Payer: Medicare Other

## 2018-06-24 ENCOUNTER — Ambulatory Visit: Payer: Medicare Other | Admitting: Hematology & Oncology

## 2018-06-24 ENCOUNTER — Inpatient Hospital Stay: Payer: Medicare Other

## 2018-06-28 DIAGNOSIS — N453 Epididymo-orchitis: Secondary | ICD-10-CM | POA: Diagnosis not present

## 2018-07-04 DIAGNOSIS — N453 Epididymo-orchitis: Secondary | ICD-10-CM | POA: Diagnosis not present

## 2018-07-13 DIAGNOSIS — N5089 Other specified disorders of the male genital organs: Secondary | ICD-10-CM | POA: Diagnosis not present

## 2018-07-13 DIAGNOSIS — N5082 Scrotal pain: Secondary | ICD-10-CM | POA: Diagnosis not present

## 2018-07-13 DIAGNOSIS — N453 Epididymo-orchitis: Secondary | ICD-10-CM | POA: Diagnosis not present

## 2018-07-13 DIAGNOSIS — I861 Scrotal varices: Secondary | ICD-10-CM | POA: Diagnosis not present

## 2018-07-18 ENCOUNTER — Other Ambulatory Visit: Payer: Medicare Other

## 2018-07-20 ENCOUNTER — Other Ambulatory Visit: Payer: Medicare Other

## 2018-07-22 ENCOUNTER — Other Ambulatory Visit (HOSPITAL_COMMUNITY)
Admission: RE | Admit: 2018-07-22 | Discharge: 2018-07-22 | Disposition: A | Discharge status: Home / Self Care | Payer: Medicare Other | Source: Ambulatory Visit | Attending: Internal Medicine | Admitting: Internal Medicine

## 2018-07-22 ENCOUNTER — Other Ambulatory Visit: Payer: Medicare Other

## 2018-07-22 DIAGNOSIS — Z113 Encounter for screening for infections with a predominantly sexual mode of transmission: Secondary | ICD-10-CM | POA: Insufficient documentation

## 2018-07-22 DIAGNOSIS — Z21 Asymptomatic human immunodeficiency virus [HIV] infection status: Secondary | ICD-10-CM

## 2018-07-22 LAB — T-HELPER CELL (CD4) - (RCID CLINIC ONLY)
CD4 % Helper T Cell: 22 % — ABNORMAL LOW (ref 33–55)
CD4 T Cell Abs: 350 /uL — ABNORMAL LOW (ref 400–2700)

## 2018-07-25 LAB — URINE CYTOLOGY ANCILLARY ONLY
Chlamydia: NEGATIVE
Neisseria Gonorrhea: NEGATIVE

## 2018-07-25 LAB — HIV-1 RNA QUANT-NO REFLEX-BLD
HIV 1 RNA Quant: 51 copies/mL — ABNORMAL HIGH
HIV-1 RNA Quant, Log: 1.71 Log copies/mL — ABNORMAL HIGH

## 2018-07-25 LAB — RPR: RPR Ser Ql: NONREACTIVE

## 2018-08-01 ENCOUNTER — Other Ambulatory Visit: Payer: Self-pay | Admitting: Internal Medicine

## 2018-08-01 DIAGNOSIS — B20 Human immunodeficiency virus [HIV] disease: Secondary | ICD-10-CM

## 2018-08-02 ENCOUNTER — Encounter: Payer: Medicare Other | Admitting: Internal Medicine

## 2018-08-04 ENCOUNTER — Other Ambulatory Visit: Payer: Self-pay

## 2018-08-04 ENCOUNTER — Encounter: Payer: Self-pay | Admitting: Internal Medicine

## 2018-08-04 ENCOUNTER — Ambulatory Visit (INDEPENDENT_AMBULATORY_CARE_PROVIDER_SITE_OTHER): Payer: Medicare Other | Admitting: Internal Medicine

## 2018-08-04 VITALS — BP 108/70 | HR 94 | Temp 98.7°F | Wt 154.0 lb

## 2018-08-04 DIAGNOSIS — C469 Kaposi's sarcoma, unspecified: Secondary | ICD-10-CM

## 2018-08-04 DIAGNOSIS — Z21 Asymptomatic human immunodeficiency virus [HIV] infection status: Secondary | ICD-10-CM

## 2018-08-07 NOTE — Assessment & Plan Note (Signed)
No significant issues with his Doxil

## 2018-08-07 NOTE — Progress Notes (Signed)
   Subjective:    Patient ID: Todd Vincent, male    DOB: 1975-07-19, 43 y.o.   MRN: [id number]  HPI Here for follow up of HIV Taking Genvoya and prezista and denies any missed doses.  CD4 now 350, viral load just 51 copies.  No asociated n/v/d.  Getting scheduled for another surgery with a 'pocket' noted on MRI.  Probably postponed for now though due to COVID-19.  Continues with Doxil monthly.     Review of Systems  Constitutional: Negative for fatigue and fever.  Gastrointestinal: Negative for nausea.  Skin: Negative for rash.       Objective:   Physical Exam  Constitutional: He appears well-developed and well-nourished. No distress.  Cardiovascular: Normal rate, regular rhythm and normal heart sounds.  No murmur heard. Pulmonary/Chest: Effort normal and breath sounds normal. No respiratory distress.  Lymphadenopathy:    He has no cervical adenopathy.  Skin: No rash noted.   SH: former smoker       Assessment & Plan:

## 2018-08-09 ENCOUNTER — Ambulatory Visit: Payer: Medicare Other | Admitting: Internal Medicine

## 2018-08-09 ENCOUNTER — Other Ambulatory Visit: Payer: Self-pay | Admitting: Hematology & Oncology

## 2018-08-09 DIAGNOSIS — C469 Kaposi's sarcoma, unspecified: Secondary | ICD-10-CM

## 2018-08-09 DIAGNOSIS — B029 Zoster without complications: Secondary | ICD-10-CM

## 2018-08-17 ENCOUNTER — Other Ambulatory Visit: Payer: Self-pay

## 2018-08-17 ENCOUNTER — Other Ambulatory Visit: Payer: Self-pay | Admitting: Hematology & Oncology

## 2018-08-17 DIAGNOSIS — C469 Kaposi's sarcoma, unspecified: Secondary | ICD-10-CM

## 2018-08-17 DIAGNOSIS — B029 Zoster without complications: Secondary | ICD-10-CM

## 2018-08-17 DIAGNOSIS — B20 Human immunodeficiency virus [HIV] disease: Secondary | ICD-10-CM

## 2018-08-18 MED ORDER — VALACYCLOVIR HCL 1 G PO TABS: 1000.0000 mg | ORAL_TABLET | Freq: Every day | ORAL | 0 refills | Status: DC | PRN

## 2018-08-19 MED ORDER — DARUNAVIR ETHANOLATE 800 MG PO TABS: 800.0000 mg | ORAL_TABLET | Freq: Every day | ORAL | 0 refills | Status: DC

## 2018-08-19 MED ORDER — ELVITEG-COBIC-EMTRICIT-TENOFAF 150-150-200-10 MG PO TABS: 1.0000 | ORAL_TABLET | Freq: Every day | ORAL | 0 refills | Status: DC

## 2018-08-22 DIAGNOSIS — C210 Malignant neoplasm of anus, unspecified: Secondary | ICD-10-CM | POA: Diagnosis not present

## 2018-08-22 DIAGNOSIS — C19 Malignant neoplasm of rectosigmoid junction: Secondary | ICD-10-CM | POA: Diagnosis not present

## 2018-08-31 DIAGNOSIS — R079 Chest pain, unspecified: Secondary | ICD-10-CM | POA: Diagnosis not present

## 2018-08-31 DIAGNOSIS — J029 Acute pharyngitis, unspecified: Secondary | ICD-10-CM | POA: Diagnosis not present

## 2018-10-17 DIAGNOSIS — Z1159 Encounter for screening for other viral diseases: Secondary | ICD-10-CM | POA: Diagnosis not present

## 2018-10-19 DIAGNOSIS — K651 Peritoneal abscess: Secondary | ICD-10-CM | POA: Diagnosis not present

## 2018-10-19 DIAGNOSIS — Z9049 Acquired absence of other specified parts of digestive tract: Secondary | ICD-10-CM | POA: Diagnosis not present

## 2018-10-19 DIAGNOSIS — C469 Kaposi's sarcoma, unspecified: Secondary | ICD-10-CM | POA: Diagnosis not present

## 2018-10-19 DIAGNOSIS — K603 Anal fistula: Secondary | ICD-10-CM | POA: Diagnosis not present

## 2018-10-19 DIAGNOSIS — K660 Peritoneal adhesions (postprocedural) (postinfection): Secondary | ICD-10-CM | POA: Diagnosis not present

## 2018-10-19 DIAGNOSIS — C44520 Squamous cell carcinoma of anal skin: Secondary | ICD-10-CM | POA: Diagnosis not present

## 2018-10-19 DIAGNOSIS — Z9221 Personal history of antineoplastic chemotherapy: Secondary | ICD-10-CM | POA: Diagnosis not present

## 2018-10-19 DIAGNOSIS — Z87891 Personal history of nicotine dependence: Secondary | ICD-10-CM | POA: Diagnosis not present

## 2018-10-19 DIAGNOSIS — Z79899 Other long term (current) drug therapy: Secondary | ICD-10-CM | POA: Diagnosis not present

## 2018-10-19 DIAGNOSIS — N360 Urethral fistula: Secondary | ICD-10-CM | POA: Diagnosis not present

## 2018-10-19 DIAGNOSIS — Z932 Ileostomy status: Secondary | ICD-10-CM | POA: Diagnosis not present

## 2018-10-19 DIAGNOSIS — C210 Malignant neoplasm of anus, unspecified: Secondary | ICD-10-CM | POA: Diagnosis not present

## 2018-10-19 DIAGNOSIS — S31000A Unspecified open wound of lower back and pelvis without penetration into retroperitoneum, initial encounter: Secondary | ICD-10-CM | POA: Diagnosis not present

## 2018-10-19 DIAGNOSIS — A630 Anogenital (venereal) warts: Secondary | ICD-10-CM | POA: Diagnosis not present

## 2018-10-19 DIAGNOSIS — B20 Human immunodeficiency virus [HIV] disease: Secondary | ICD-10-CM | POA: Diagnosis not present

## 2018-10-27 DIAGNOSIS — Z87891 Personal history of nicotine dependence: Secondary | ICD-10-CM | POA: Diagnosis not present

## 2018-10-27 DIAGNOSIS — C210 Malignant neoplasm of anus, unspecified: Secondary | ICD-10-CM | POA: Diagnosis not present

## 2018-10-27 DIAGNOSIS — Z9049 Acquired absence of other specified parts of digestive tract: Secondary | ICD-10-CM | POA: Diagnosis not present

## 2018-10-27 DIAGNOSIS — N3289 Other specified disorders of bladder: Secondary | ICD-10-CM | POA: Diagnosis not present

## 2018-10-27 DIAGNOSIS — B20 Human immunodeficiency virus [HIV] disease: Secondary | ICD-10-CM | POA: Diagnosis not present

## 2018-10-27 DIAGNOSIS — Z79899 Other long term (current) drug therapy: Secondary | ICD-10-CM | POA: Diagnosis not present

## 2018-10-31 DIAGNOSIS — C211 Malignant neoplasm of anal canal: Secondary | ICD-10-CM | POA: Diagnosis not present

## 2018-11-04 DIAGNOSIS — C210 Malignant neoplasm of anus, unspecified: Secondary | ICD-10-CM | POA: Diagnosis not present

## 2018-11-04 DIAGNOSIS — Z87891 Personal history of nicotine dependence: Secondary | ICD-10-CM | POA: Diagnosis not present

## 2018-11-04 DIAGNOSIS — N400 Enlarged prostate without lower urinary tract symptoms: Secondary | ICD-10-CM | POA: Diagnosis not present

## 2018-11-04 DIAGNOSIS — Z9049 Acquired absence of other specified parts of digestive tract: Secondary | ICD-10-CM | POA: Diagnosis not present

## 2018-11-04 DIAGNOSIS — B20 Human immunodeficiency virus [HIV] disease: Secondary | ICD-10-CM | POA: Diagnosis not present

## 2018-11-04 DIAGNOSIS — Z79899 Other long term (current) drug therapy: Secondary | ICD-10-CM | POA: Diagnosis not present

## 2018-11-04 DIAGNOSIS — N3289 Other specified disorders of bladder: Secondary | ICD-10-CM | POA: Diagnosis not present

## 2018-11-06 ENCOUNTER — Other Ambulatory Visit: Payer: Self-pay

## 2018-11-06 DIAGNOSIS — B20 Human immunodeficiency virus [HIV] disease: Secondary | ICD-10-CM

## 2018-11-07 MED ORDER — GENVOYA 150-150-200-10 MG PO TABS: 1.0000 | ORAL_TABLET | Freq: Every day | ORAL | 0 refills | Status: DC

## 2018-11-07 MED ORDER — DARUNAVIR ETHANOLATE 800 MG PO TABS: 800.0000 mg | ORAL_TABLET | Freq: Every day | ORAL | 0 refills | Status: DC

## 2018-11-11 DIAGNOSIS — C4492 Squamous cell carcinoma of skin, unspecified: Secondary | ICD-10-CM | POA: Diagnosis not present

## 2018-11-11 DIAGNOSIS — Z01812 Encounter for preprocedural laboratory examination: Secondary | ICD-10-CM | POA: Diagnosis not present

## 2018-11-11 DIAGNOSIS — C210 Malignant neoplasm of anus, unspecified: Secondary | ICD-10-CM | POA: Diagnosis not present

## 2018-11-24 DIAGNOSIS — C210 Malignant neoplasm of anus, unspecified: Secondary | ICD-10-CM | POA: Diagnosis not present

## 2018-12-03 ENCOUNTER — Other Ambulatory Visit: Payer: Self-pay | Admitting: Hematology & Oncology

## 2018-12-03 DIAGNOSIS — B029 Zoster without complications: Secondary | ICD-10-CM

## 2018-12-03 DIAGNOSIS — C469 Kaposi's sarcoma, unspecified: Secondary | ICD-10-CM

## 2019-01-06 DIAGNOSIS — Z1159 Encounter for screening for other viral diseases: Secondary | ICD-10-CM | POA: Diagnosis not present

## 2019-01-09 ENCOUNTER — Other Ambulatory Visit: Payer: Self-pay | Admitting: Hematology & Oncology

## 2019-01-09 DIAGNOSIS — Z9221 Personal history of antineoplastic chemotherapy: Secondary | ICD-10-CM | POA: Diagnosis not present

## 2019-01-09 DIAGNOSIS — T8183XA Persistent postprocedural fistula, initial encounter: Secondary | ICD-10-CM | POA: Diagnosis not present

## 2019-01-09 DIAGNOSIS — Z87891 Personal history of nicotine dependence: Secondary | ICD-10-CM | POA: Diagnosis not present

## 2019-01-09 DIAGNOSIS — Z79899 Other long term (current) drug therapy: Secondary | ICD-10-CM | POA: Diagnosis not present

## 2019-01-09 DIAGNOSIS — B20 Human immunodeficiency virus [HIV] disease: Secondary | ICD-10-CM | POA: Diagnosis present

## 2019-01-09 DIAGNOSIS — Z9049 Acquired absence of other specified parts of digestive tract: Secondary | ICD-10-CM | POA: Diagnosis not present

## 2019-01-09 DIAGNOSIS — Z8589 Personal history of malignant neoplasm of other organs and systems: Secondary | ICD-10-CM | POA: Diagnosis not present

## 2019-01-09 DIAGNOSIS — C763 Malignant neoplasm of pelvis: Secondary | ICD-10-CM | POA: Diagnosis not present

## 2019-01-09 DIAGNOSIS — A630 Anogenital (venereal) warts: Secondary | ICD-10-CM | POA: Diagnosis not present

## 2019-01-09 DIAGNOSIS — C210 Malignant neoplasm of anus, unspecified: Secondary | ICD-10-CM | POA: Diagnosis not present

## 2019-01-09 DIAGNOSIS — D013 Carcinoma in situ of anus and anal canal: Secondary | ICD-10-CM | POA: Diagnosis not present

## 2019-01-10 ENCOUNTER — Other Ambulatory Visit: Payer: Self-pay | Admitting: *Deleted

## 2019-01-10 DIAGNOSIS — C469 Kaposi's sarcoma, unspecified: Secondary | ICD-10-CM

## 2019-01-10 DIAGNOSIS — C210 Malignant neoplasm of anus, unspecified: Secondary | ICD-10-CM | POA: Diagnosis not present

## 2019-01-10 DIAGNOSIS — B029 Zoster without complications: Secondary | ICD-10-CM

## 2019-01-10 DIAGNOSIS — A630 Anogenital (venereal) warts: Secondary | ICD-10-CM | POA: Diagnosis not present

## 2019-01-10 MED ORDER — PREGABALIN 75 MG PO CAPS: 75.0000 mg | ORAL_CAPSULE | Freq: Two times a day (BID) | ORAL | 4 refills | Status: DC

## 2019-01-11 ENCOUNTER — Other Ambulatory Visit: Payer: Self-pay | Admitting: *Deleted

## 2019-01-11 DIAGNOSIS — B029 Zoster without complications: Secondary | ICD-10-CM

## 2019-01-11 DIAGNOSIS — C469 Kaposi's sarcoma, unspecified: Secondary | ICD-10-CM

## 2019-01-11 MED ORDER — PREGABALIN 75 MG PO CAPS: 75.0000 mg | ORAL_CAPSULE | Freq: Two times a day (BID) | ORAL | 4 refills | Status: DC

## 2019-01-12 ENCOUNTER — Telehealth: Payer: Self-pay | Admitting: *Deleted

## 2019-01-12 ENCOUNTER — Other Ambulatory Visit: Payer: Self-pay | Admitting: *Deleted

## 2019-01-12 DIAGNOSIS — B029 Zoster without complications: Secondary | ICD-10-CM

## 2019-01-12 DIAGNOSIS — C469 Kaposi's sarcoma, unspecified: Secondary | ICD-10-CM

## 2019-01-12 MED ORDER — PREGABALIN 75 MG PO CAPS: 75.0000 mg | ORAL_CAPSULE | Freq: Two times a day (BID) | ORAL | 4 refills | Status: DC

## 2019-01-12 NOTE — Telephone Encounter (Signed)
Call received from [place]ard at [facility] requesting a 30 day refill be sent for Lyrica.  Prescription for Lyrica with quantity 60 routed to Dr. Marin Olp.

## 2019-02-06 ENCOUNTER — Other Ambulatory Visit: Payer: Self-pay

## 2019-02-06 ENCOUNTER — Other Ambulatory Visit: Payer: Medicare Other

## 2019-02-06 DIAGNOSIS — Z21 Asymptomatic human immunodeficiency virus [HIV] infection status: Secondary | ICD-10-CM

## 2019-02-07 ENCOUNTER — Encounter: Payer: Self-pay | Admitting: Family

## 2019-02-08 ENCOUNTER — Other Ambulatory Visit: Payer: Self-pay | Admitting: Family

## 2019-02-08 ENCOUNTER — Telehealth: Payer: Self-pay | Admitting: Hematology & Oncology

## 2019-02-08 DIAGNOSIS — C469 Kaposi's sarcoma, unspecified: Secondary | ICD-10-CM

## 2019-02-08 LAB — HELPER T-LYMPH-CD4 (ARMC ONLY)
% CD 4 Pos. Lymph.: 12.8 % — ABNORMAL LOW (ref 30.8–58.5)
Absolute CD 4 Helper: 461 /uL (ref 359–1519)
Basophils Absolute: 0.1 10*3/uL (ref 0.0–0.2)
Basos: 1 %
EOS (ABSOLUTE): 0.3 10*3/uL (ref 0.0–0.4)
Eos: 3 %
Hematocrit: 43.3 % (ref 37.5–51.0)
Hemoglobin: 15.1 g/dL (ref 13.0–17.7)
Immature Grans (Abs): 0 10*3/uL (ref 0.0–0.1)
Immature Granulocytes: 0 %
Lymphocytes Absolute: 3.6 10*3/uL — ABNORMAL HIGH (ref 0.7–3.1)
Lymphs: 42 %
MCH: 32.4 pg (ref 26.6–33.0)
MCHC: 34.9 g/dL (ref 31.5–35.7)
MCV: 93 fL (ref 79–97)
Monocytes Absolute: 0.5 10*3/uL (ref 0.1–0.9)
Monocytes: 6 %
Neutrophils Absolute: 4.2 10*3/uL (ref 1.4–7.0)
Neutrophils: 48 %
Platelets: 329 10*3/uL (ref 150–450)
RBC: 4.66 x10E6/uL (ref 4.14–5.80)
RDW: 13.3 % (ref 11.6–15.4)
WBC: 8.7 10*3/uL (ref 3.4–10.8)

## 2019-02-08 NOTE — Telephone Encounter (Signed)
Spoke with patient to confirm appts per 9/23 sch msg

## 2019-02-09 DIAGNOSIS — C210 Malignant neoplasm of anus, unspecified: Secondary | ICD-10-CM | POA: Diagnosis not present

## 2019-02-10 LAB — HIV-1 RNA QUANT-NO REFLEX-BLD
HIV 1 RNA Quant: 54 copies/mL — ABNORMAL HIGH
HIV-1 RNA Quant, Log: 1.73 Log copies/mL — ABNORMAL HIGH

## 2019-02-16 ENCOUNTER — Other Ambulatory Visit: Payer: Self-pay | Admitting: Hematology & Oncology

## 2019-02-20 ENCOUNTER — Ambulatory Visit (HOSPITAL_BASED_OUTPATIENT_CLINIC_OR_DEPARTMENT_OTHER)
Admission: RE | Admit: 2019-02-20 | Discharge: 2019-02-20 | Disposition: A | Discharge status: Home / Self Care | Payer: Medicare Other | Source: Ambulatory Visit | Attending: Family | Admitting: Family

## 2019-02-20 ENCOUNTER — Encounter: Payer: Self-pay | Admitting: Internal Medicine

## 2019-02-20 ENCOUNTER — Ambulatory Visit (INDEPENDENT_AMBULATORY_CARE_PROVIDER_SITE_OTHER): Payer: Medicare Other | Admitting: Internal Medicine

## 2019-02-20 ENCOUNTER — Other Ambulatory Visit: Payer: Self-pay

## 2019-02-20 ENCOUNTER — Encounter: Payer: Medicare Other | Admitting: Family

## 2019-02-20 VITALS — BP 120/77 | HR 94 | Temp 98.4°F

## 2019-02-20 DIAGNOSIS — Z01818 Encounter for other preprocedural examination: Secondary | ICD-10-CM | POA: Diagnosis not present

## 2019-02-20 DIAGNOSIS — Z113 Encounter for screening for infections with a predominantly sexual mode of transmission: Secondary | ICD-10-CM | POA: Diagnosis not present

## 2019-02-20 DIAGNOSIS — Z79899 Other long term (current) drug therapy: Secondary | ICD-10-CM

## 2019-02-20 DIAGNOSIS — N289 Disorder of kidney and ureter, unspecified: Secondary | ICD-10-CM

## 2019-02-20 DIAGNOSIS — C469 Kaposi's sarcoma, unspecified: Secondary | ICD-10-CM

## 2019-02-20 DIAGNOSIS — Z21 Asymptomatic human immunodeficiency virus [HIV] infection status: Secondary | ICD-10-CM | POA: Diagnosis not present

## 2019-02-20 DIAGNOSIS — Z23 Encounter for immunization: Secondary | ICD-10-CM

## 2019-02-20 LAB — ECHOCARDIOGRAM COMPLETE

## 2019-02-20 NOTE — Assessment & Plan Note (Signed)
Most recent creat in July stable at 1.02. will continue to monitor

## 2019-02-20 NOTE — Progress Notes (Signed)
   Subjective:    Patient ID: Todd Vincent, male    DOB: Nov 04, 1975, 43 y.o.   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  He has undergone recent surgical procedures and hopeful that he will not need any more.  He has no new issues.  Working some and living with his parents still and doing well.  He is going to restart his Doxil next month and is getting an echo today.      Review of Systems  Constitutional: Negative for fatigue.  Gastrointestinal: Negative for nausea.  Skin: Negative for rash.       Objective:   Physical Exam  Constitutional: He appears well-developed and well-nourished. No distress.  Cardiovascular: Normal rate, regular rhythm and normal heart sounds.  No murmur heard. Pulmonary/Chest: Effort normal and breath sounds normal. No respiratory distress.  Lymphadenopathy:    He has no cervical adenopathy.  Skin: No rash noted.  Psychiatric: He has a normal mood and affect.   SH: former smoker       Assessment & Plan:

## 2019-02-20 NOTE — Assessment & Plan Note (Signed)
Going to restart chemo next month.

## 2019-02-20 NOTE — Assessment & Plan Note (Signed)
Doing well and will continue with the same.  rtc 6 months.

## 2019-02-20 NOTE — Progress Notes (Signed)
  Echocardiogram 2D Echocardiogram has been performed.  Todd Vincent 02/20/2019, 1:53 PM

## 2019-02-20 NOTE — Addendum Note (Signed)
Addended by: Aundria Rud on: 02/20/2019 11:53 AM   Modules accepted: Orders

## 2019-02-21 ENCOUNTER — Telehealth: Payer: Self-pay | Admitting: *Deleted

## 2019-02-21 NOTE — Telephone Encounter (Signed)
As noted below by Laverna Peace, NP, I informed the patient of his ECHO results. He verbalized understanding.

## 2019-02-21 NOTE — Telephone Encounter (Signed)
-----   Message from Eliezer Bottom, NP sent at 02/21/2019  9:20 AM EDT ----- He still has the heart of a champion!!!!   ----- Message ----- From: Interface, [employer] Sent: 02/20/2019   5:55 PM EDT To: Eliezer Bottom, NP

## 2019-03-28 ENCOUNTER — Inpatient Hospital Stay: Payer: Medicare Other

## 2019-03-28 ENCOUNTER — Other Ambulatory Visit: Payer: Self-pay

## 2019-03-28 ENCOUNTER — Inpatient Hospital Stay: Payer: Medicare Other | Attending: Hematology & Oncology | Admitting: Hematology & Oncology

## 2019-03-28 ENCOUNTER — Encounter: Payer: Self-pay | Admitting: Hematology & Oncology

## 2019-03-28 VITALS — BP 120/74 | HR 76 | Temp 97.1°F | Resp 18 | Wt 158.0 lb

## 2019-03-28 DIAGNOSIS — Z21 Asymptomatic human immunodeficiency virus [HIV] infection status: Secondary | ICD-10-CM | POA: Insufficient documentation

## 2019-03-28 DIAGNOSIS — C469 Kaposi's sarcoma, unspecified: Secondary | ICD-10-CM

## 2019-03-28 DIAGNOSIS — C20 Malignant neoplasm of rectum: Secondary | ICD-10-CM | POA: Insufficient documentation

## 2019-03-28 DIAGNOSIS — Z5111 Encounter for antineoplastic chemotherapy: Secondary | ICD-10-CM | POA: Insufficient documentation

## 2019-03-28 LAB — CMP (CANCER CENTER ONLY)
ALT: 8 U/L (ref 0–44)
AST: 10 U/L — ABNORMAL LOW (ref 15–41)
Albumin: 4.2 g/dL (ref 3.5–5.0)
Alkaline Phosphatase: 71 U/L (ref 38–126)
Anion gap: 6 (ref 5–15)
BUN: 12 mg/dL (ref 6–20)
CO2: 29 mmol/L (ref 22–32)
Calcium: 9.3 mg/dL (ref 8.9–10.3)
Chloride: 104 mmol/L (ref 98–111)
Creatinine: 1.09 mg/dL (ref 0.61–1.24)
GFR, Est AFR Am: >60 mL/min (ref >60)
GFR, Estimated: >60 mL/min (ref >60)
Glucose, Bld: 86 mg/dL (ref 70–99)
Potassium: 3.8 mmol/L (ref 3.5–5.1)
Sodium: 139 mmol/L (ref 135–145)
Total Bilirubin: 0.4 mg/dL (ref 0.3–1.2)
Total Protein: 7 g/dL (ref 6.5–8.1)

## 2019-03-28 LAB — CBC WITH DIFFERENTIAL (CANCER CENTER ONLY)
Abs Immature Granulocytes: 0.05 10*3/uL (ref 0.00–0.07)
Basophils Absolute: 0 10*3/uL (ref 0.0–0.1)
Basophils Relative: 0 %
Eosinophils Absolute: 0.4 10*3/uL (ref 0.0–0.5)
Eosinophils Relative: 5 %
HCT: 44.2 % (ref 39.0–52.0)
Hemoglobin: 15.1 g/dL (ref 13.0–17.0)
Immature Granulocytes: 1 %
Lymphocytes Relative: 39 %
Lymphs Abs: 2.8 10*3/uL (ref 0.7–4.0)
MCH: 31.7 pg (ref 26.0–34.0)
MCHC: 34.2 g/dL (ref 30.0–36.0)
MCV: 92.7 fL (ref 80.0–100.0)
Monocytes Absolute: 0.6 10*3/uL (ref 0.1–1.0)
Monocytes Relative: 8 %
Neutro Abs: 3.5 10*3/uL (ref 1.7–7.7)
Neutrophils Relative %: 47 %
Platelet Count: 299 10*3/uL (ref 150–400)
RBC: 4.77 MIL/uL (ref 4.22–5.81)
RDW: 13.5 % (ref 11.5–15.5)
WBC Count: 7.4 10*3/uL (ref 4.0–10.5)
nRBC: 0 % (ref 0.0–0.2)

## 2019-03-28 LAB — IRON AND TIBC
Iron: 81 ug/dL (ref 42–163)
Saturation Ratios: 27 % (ref 20–55)
TIBC: 301 ug/dL (ref 202–409)
UIBC: 220 ug/dL (ref 117–376)

## 2019-03-28 LAB — LACTATE DEHYDROGENASE: LDH: 174 U/L (ref 98–192)

## 2019-03-28 LAB — FERRITIN: Ferritin: 42 ng/mL (ref 24–336)

## 2019-03-28 LAB — CEA (IN HOUSE-CHCC): CEA (CHCC-In House): 2.83 ng/mL (ref 0.00–5.00)

## 2019-03-28 MED ORDER — ONDANSETRON HCL 8 MG PO TABS
ORAL_TABLET | ORAL | Status: AC
  Filled 2019-03-28: qty 1

## 2019-03-28 MED ORDER — DOXORUBICIN HCL LIPOSOMAL CHEMO INJECTION 2 MG/ML
20.0000 mg/m2 | Freq: Once | INTRAVENOUS | Status: AC
  Administered 2019-03-28: 36 mg via INTRAVENOUS
  Filled 2019-03-28: qty 18

## 2019-03-28 MED ORDER — DEXAMETHASONE SODIUM PHOSPHATE 10 MG/ML IJ SOLN
10.0000 mg | Freq: Once | INTRAMUSCULAR | Status: AC
  Administered 2019-03-28: 10 mg via INTRAVENOUS

## 2019-03-28 MED ORDER — ONDANSETRON HCL 8 MG PO TABS
8.0000 mg | ORAL_TABLET | Freq: Once | ORAL | Status: AC
  Administered 2019-03-28: 8 mg via ORAL

## 2019-03-28 MED ORDER — DEXAMETHASONE SODIUM PHOSPHATE 10 MG/ML IJ SOLN
INTRAMUSCULAR | Status: AC
  Filled 2019-03-28: qty 1

## 2019-03-28 MED ORDER — DEXTROSE 5 % IV SOLN
Freq: Once | INTRAVENOUS | Status: AC
  Administered 2019-03-28: 11:00:00 via INTRAVENOUS
  Filled 2019-03-28: qty 250

## 2019-03-28 NOTE — Progress Notes (Signed)
Hematology and Oncology Follow Up Visit  EPHREM KNISELEY [id number] [birth year] 43 y.o. 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 Lanthier is here today for follow-up.  We have not seen him since January.  He has been busy down in [place].  He was supposed to have surgery for an abdominal issues because of past I think abdominal surgery.  He was going to have some type of muscle placed from his thigh into the abdominal cavity to hold up his small bowel.  It sounds like he may have had some type of prolapse.  At the time of surgery, there appear to be a lot of scar tissue.  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  It was not malignant.  Hopefully, we will be able to get the records from his surgeon.  He did have a echocardiogram done on 02/20/2019.  This showed an ejection fraction of 55-60%.  He thinks that his Kaposi's has been doing okay.  He really has not noted any new lesions.  He says that his legs are little bit more swollen.  This has been a problem for him in the past.  He has had no fever.  He has been very diligent with the coronavirus.  Overall, the ostomy that he has seems to be working fairly well.  His performance status is ECOG 0.  .    Medications:  Allergies as of 03/28/2019      Reactions   Sulfa Antibiotics Rash, Itching   Extreme itching    Sulfasalazine Rash   Extreme itching       Medication List       Accurate as of March 28, 2019 10:54 AM. If you have any questions, ask your nurse or doctor.        acyclovir 800 MG tablet Commonly known as: ZOVIRAX Take 800 mg by mouth 5 (five) times daily.   citalopram 10 MG tablet Commonly known as: CELEXA TAKE 1 TABLET(10 MG) BY MOUTH DAILY   COCONUT OIL PO  Take 1,000 mg by mouth 2 (two) times daily.   darunavir 800 MG tablet Commonly known as: Prezista Take 1 tablet (800 mg total) by mouth daily with breakfast.   dronabinol 5 MG capsule Commonly known as: MARINOL TAKE 1 CAPSULE BY MOUTH TWICE DAILY BEFORE LUNCH AND SUPPER   dronabinol 5 MG capsule Commonly known as: MARINOL TAKE 1 CAPSULE BY MOUTH TWICE DAILY BEFORE LUNCH AND SUPPER   Genvoya 150-150-200-10 MG Tabs tablet Generic drug: elvitegravir-cobicistat-emtricitabine-tenofovir Take 1 tablet by mouth daily.   Glycopyrronium Tosylate 2.4 % Pads Commonly known as: Qbrexza Apply 1 application topically daily. To each underarm.   LORazepam 0.5 MG tablet Commonly known as: ATIVAN TAKE 1 BY MOUTH EVERY 6 HOURS AS NEEDED   meclizine 25 MG tablet Commonly known as: ANTIVERT Take 1 tablet (25 mg total) by mouth every 6 (six) hours as needed for dizziness.   ondansetron 8 MG tablet Commonly known as: ZOFRAN Take 1 tablet (8 mg total) by mouth 2 (two) times daily. Start the day after chemo for 2 days. Then take as needed for nausea or vomiting.   pregabalin 75 MG capsule Commonly known as: LYRICA Take 1 capsule (75 mg total) by mouth 2 (two) times daily.   temazepam 30 MG  capsule Commonly known as: RESTORIL Take 1 capsule (30 mg total) by mouth at bedtime as needed for sleep.   triamterene-hydrochlorothiazide 37.5-25 MG tablet Commonly known as: MAXZIDE-25 TAKE 1 TABLET BY MOUTH EVERY DAY   valACYclovir 1000 MG tablet Commonly known as: VALTREX TAKE 1 TABLET(1000 MG) BY MOUTH DAILY AS NEEDED       Allergies:  Allergies  Allergen Reactions  . Sulfa Antibiotics Rash and Itching    Extreme itching   . Sulfasalazine Rash    Extreme itching     Past Medical History, Surgical history, Social history, and Family History were reviewed and updated.  Review of Systems: Review of Systems  Constitutional: Negative.   HENT: Negative.   Eyes: Negative.   Respiratory:  Negative.   Cardiovascular: Positive for leg swelling.  Gastrointestinal: Negative.   Genitourinary: Negative.   Musculoskeletal: Positive for myalgias.  Skin: Negative.   Neurological: Positive for tingling.  Endo/Heme/Allergies: Negative.   Psychiatric/Behavioral: Negative.       Physical Exam:  weight is 158 lb (71.7 kg). His oral temperature is 97.1 F (36.2 C) (abnormal). His blood pressure is 120/74 and his pulse is 76. His respiration is 18 and oxygen saturation is 100%.   Wt Readings from Last 3 Encounters:  03/28/19 158 lb (71.7 kg)  08/04/18 154 lb (69.9 kg)  05/24/18 152 lb 8 oz (69.2 kg)    Physical Exam Vitals signs reviewed.  HENT:     Head: Normocephalic and atraumatic.  Eyes:     Pupils: Pupils are equal, round, and reactive to light.  Neck:     Musculoskeletal: Normal range of motion.  Cardiovascular:     Rate and Rhythm: Normal rate and regular rhythm.     Heart sounds: Normal heart sounds.  Pulmonary:     Effort: Pulmonary effort is normal.     Breath sounds: Normal breath sounds.  Abdominal:     General: Bowel sounds are normal.     Palpations: Abdomen is soft.  Musculoskeletal: Normal range of motion.        General: No tenderness or deformity.  Lymphadenopathy:     Cervical: No cervical adenopathy.  Skin:    General: Skin is warm and dry.     Findings: No erythema or rash.  Neurological:     Mental Status: He is alert and oriented to person, place, and time.  Psychiatric:        Behavior: Behavior normal.        Thought Content: Thought content normal.        Judgment: Judgment normal.      Lab Results  Component Value Date   WBC 7.4 03/28/2019   HGB 15.1 03/28/2019   HCT 44.2 03/28/2019   MCV 92.7 03/28/2019   PLT 299 03/28/2019   No results found for: FERRITIN, IRON, TIBC, UIBC, IRONPCTSAT Lab Results  Component Value Date   RBC 4.77 03/28/2019   No results found for: KPAFRELGTCHN, LAMBDASER, KAPLAMBRATIO No results found  for: IGGSERUM, IGA, IGMSERUM No results found for: Odetta Pink, SPEI   Chemistry      Component Value Date/Time   NA 139 03/28/2019 0934   NA 141 04/19/2017 1007   NA 141 12/13/2015 0827   K 3.8 03/28/2019 0934   K 3.4 04/19/2017 1007   K 4.0 12/13/2015 0827   CL 104 03/28/2019 0934   CL 105 04/19/2017 1007   CO2 29 03/28/2019 0934   CO2 27  04/19/2017 1007   CO2 26 12/13/2015 0827   BUN 12 03/28/2019 0934   BUN 8 04/19/2017 1007   BUN 8.2 12/13/2015 0827   CREATININE 1.09 03/28/2019 0934   CREATININE 1.3 (H) 04/19/2017 1007   CREATININE 1.2 12/13/2015 0827      Component Value Date/Time   CALCIUM 9.3 03/28/2019 0934   CALCIUM 9.3 04/19/2017 1007   CALCIUM 9.2 12/13/2015 0827   ALKPHOS 71 03/28/2019 0934   ALKPHOS 82 04/19/2017 1007   ALKPHOS 78 12/13/2015 0827   AST 10 (L) 03/28/2019 0934   AST 14 12/13/2015 0827   ALT 8 03/28/2019 0934   ALT 23 04/19/2017 1007   ALT 13 12/13/2015 0827   BILITOT 0.4 03/28/2019 0934   BILITOT 0.40 12/13/2015 0827       Impression and Plan: Mr. Griesmer is a very pleasant 43 yo African American gentleman with Kaposi's sarcoma as well as history of rectal cancer.   He has done well with the Doxil.  He has not had Doxil now for 10 months.  I do think that he really has had much in the way of progression of the Kaposi's.  We will go ahead get back on Doxil..  I do not think we have to do any scans on him right now.  I am sure he has scans done down in [place].  We will have to hopefully get those set up so that we can see them.  We will plan to get her back in another month.  It was just nice to see him again.  It is always a good time talking to him.    [name], MD 11/10/202010:54 AM

## 2019-03-28 NOTE — Patient Instructions (Signed)
Doxorubicin Liposomal injection What is this medicine? LIPOSOMAL DOXORUBICIN (LIP oh som al dox oh ROO bi sin) is a chemotherapy drug. This medicine is used to treat many kinds of cancer like Kaposi's sarcoma, multiple myeloma, and ovarian cancer. This medicine may be used for other purposes; ask your health care provider or pharmacist if you have questions. COMMON BRAND NAME(S): Doxil, Lipodox What should I tell my health care provider before I take this medicine? They need to know if you have any of these conditions:  blood disorders  heart disease  infection (especially a virus infection such as chickenpox, cold sores, or herpes)  liver disease  recent or ongoing radiation therapy  an unusual or allergic reaction to doxorubicin, other chemotherapy agents, soybeans, other medicines, foods, dyes, or preservatives  pregnant or trying to get pregnant  breast-feeding How should I use this medicine? This drug is given as an infusion into a vein. It is administered in a hospital or clinic by a specially trained health care professional. If you have pain, swelling, burning or any unusual feeling around the site of your injection, tell your health care professional right away. Talk to your pediatrician regarding the use of this medicine in children. Special care may be needed. Overdosage: If you think you have taken too much of this medicine contact a poison control center or emergency room at once. NOTE: This medicine is only for you. Do not share this medicine with others. What if I miss a dose? It is important not to miss your dose. Call your doctor or health care professional if you are unable to keep an appointment. What may interact with this medicine? Do not take this medicine with any of the following medications:  zidovudine This medicine may also interact with the following medications:  medicines to increase blood counts like filgrastim, pegfilgrastim,  sargramostim  vaccines Talk to your doctor or health care professional before taking any of these medicines:  acetaminophen  aspirin  ibuprofen  ketoprofen  naproxen This list may not describe all possible interactions. Give your health care provider a list of all the medicines, herbs, non-prescription drugs, or dietary supplements you use. Also tell them if you smoke, drink alcohol, or use illegal drugs. Some items may interact with your medicine. What should I watch for while using this medicine? Your condition will be monitored carefully while you are receiving this medicine. You may need blood work done while you are taking this medicine. This drug may make you feel generally unwell. This is not uncommon, as chemotherapy can affect healthy cells as well as cancer cells. Report any side effects. Continue your course of treatment even though you feel ill unless your doctor tells you to stop. Your urine may turn orange-red for a few days after your dose. This is not blood. If your urine is dark or brown, call your doctor. In some cases, you may be given additional medicines to help with side effects. Follow all directions for their use. Talk to your doctor about your risk of cancer. You may be more at risk for certain types of cancers if you take this medicine. Do not become pregnant while taking this medicine or for 6 months after stopping it. Women should inform their healthcare professional if they wish to become pregnant or think they may be pregnant. Men should not father a child while taking this medicine and for 6 months after stopping it. There is a potential for serious side effects to an unborn child.   Talk to your health care professional or pharmacist for more information. Do not breast-feed an infant while taking this medicine. This medicine has caused ovarian failure in some women. This medicine may make it more difficult to get pregnant. Talk to your healthcare professional if  you are concerned about your fertility. This medicine has caused decreased sperm counts in some men. This may make it more difficult to father a child. Talk to your healthcare professional if you are concerned about your fertility. This medicine may cause a decrease in Co-Enzyme Q-10. You should make sure that you get enough Co-Enzyme Q-10 while you are taking this medicine. Discuss the foods you eat and the vitamins you take with your health care professional. What side effects may I notice from receiving this medicine? Side effects that you should report to your doctor or health care professional as soon as possible:  allergic reactions like skin rash, itching or hives, swelling of the face, lips, or tongue  low blood counts - this medicine may decrease the number of white blood cells, red blood cells and platelets. You may be at increased risk for infections and bleeding.  signs of hand-foot syndrome - tingling or burning, redness, flaking, swelling, small blisters, or small sores on the palms of your hands or the soles of your feet  signs of infection - fever or chills, cough, sore throat, pain or difficulty passing urine  signs of decreased platelets or bleeding - bruising, pinpoint red spots on the skin, black, tarry stools, blood in the urine  signs of decreased red blood cells - unusually weak or tired, fainting spells, lightheadedness  back pain, chills, facial flushing, fever, headache, tightness in the chest or throat during the infusion  breathing problems  chest pain  fast, irregular heartbeat  mouth pain, redness, sores  pain, swelling, redness at site where injected  pain, tingling, numbness in the hands or feet  swelling of ankles, feet, or hands  vomiting Side effects that usually do not require medical attention (report to your doctor or health care professional if they continue or are bothersome):  diarrhea  hair loss  loss of appetite  nail discoloration  or damage  nausea  red or watery eyes  red colored urine  stomach upset This list may not describe all possible side effects. Call your doctor for medical advice about side effects. You may report side effects to FDA at 1-800-FDA-1088. Where should I keep my medicine? This drug is given in a hospital or clinic and will not be stored at home. NOTE: This sheet is a summary. It may not cover all possible information. If you have questions about this medicine, talk to your doctor, pharmacist, or health care provider.  2020 Elsevier/Gold Standard (2018-01-10 15:13:26)  

## 2019-04-04 ENCOUNTER — Other Ambulatory Visit: Payer: Self-pay | Admitting: Hematology & Oncology

## 2019-04-04 DIAGNOSIS — B029 Zoster without complications: Secondary | ICD-10-CM

## 2019-04-04 DIAGNOSIS — C469 Kaposi's sarcoma, unspecified: Secondary | ICD-10-CM

## 2019-04-21 ENCOUNTER — Other Ambulatory Visit: Payer: Self-pay | Admitting: Internal Medicine

## 2019-04-21 DIAGNOSIS — B20 Human immunodeficiency virus [HIV] disease: Secondary | ICD-10-CM

## 2019-04-21 MED ORDER — GENVOYA 150-150-200-10 MG PO TABS: 1.0000 | ORAL_TABLET | Freq: Every day | ORAL | 1 refills | Status: DC

## 2019-04-25 ENCOUNTER — Inpatient Hospital Stay: Payer: Medicare Other

## 2019-04-25 ENCOUNTER — Encounter: Payer: Self-pay | Admitting: Hematology & Oncology

## 2019-04-25 ENCOUNTER — Other Ambulatory Visit: Payer: Self-pay

## 2019-04-25 ENCOUNTER — Inpatient Hospital Stay: Payer: Medicare Other | Attending: Hematology & Oncology | Admitting: Hematology & Oncology

## 2019-04-25 VITALS — BP 128/77 | HR 77 | Temp 97.7°F | Resp 16 | Wt 159.0 lb

## 2019-04-25 DIAGNOSIS — Z21 Asymptomatic human immunodeficiency virus [HIV] infection status: Secondary | ICD-10-CM | POA: Insufficient documentation

## 2019-04-25 DIAGNOSIS — C20 Malignant neoplasm of rectum: Secondary | ICD-10-CM | POA: Diagnosis present

## 2019-04-25 DIAGNOSIS — C469 Kaposi's sarcoma, unspecified: Secondary | ICD-10-CM | POA: Insufficient documentation

## 2019-04-25 DIAGNOSIS — Z5111 Encounter for antineoplastic chemotherapy: Secondary | ICD-10-CM | POA: Diagnosis not present

## 2019-04-25 LAB — CBC WITH DIFFERENTIAL (CANCER CENTER ONLY)
Abs Immature Granulocytes: 0.06 10*3/uL (ref 0.00–0.07)
Basophils Absolute: 0.1 10*3/uL (ref 0.0–0.1)
Basophils Relative: 1 %
Eosinophils Absolute: 0.2 10*3/uL (ref 0.0–0.5)
Eosinophils Relative: 3 %
HCT: 43.7 % (ref 39.0–52.0)
Hemoglobin: 15 g/dL (ref 13.0–17.0)
Immature Granulocytes: 1 %
Lymphocytes Relative: 33 %
Lymphs Abs: 2.8 10*3/uL (ref 0.7–4.0)
MCH: 31.4 pg (ref 26.0–34.0)
MCHC: 34.3 g/dL (ref 30.0–36.0)
MCV: 91.4 fL (ref 80.0–100.0)
Monocytes Absolute: 0.7 10*3/uL (ref 0.1–1.0)
Monocytes Relative: 8 %
Neutro Abs: 4.8 10*3/uL (ref 1.7–7.7)
Neutrophils Relative %: 54 %
Platelet Count: 370 10*3/uL (ref 150–400)
RBC: 4.78 MIL/uL (ref 4.22–5.81)
RDW: 13.4 % (ref 11.5–15.5)
WBC Count: 8.6 10*3/uL (ref 4.0–10.5)
nRBC: 0 % (ref 0.0–0.2)

## 2019-04-25 LAB — CMP (CANCER CENTER ONLY)
ALT: 12 U/L (ref 0–44)
AST: 12 U/L — ABNORMAL LOW (ref 15–41)
Albumin: 4.2 g/dL (ref 3.5–5.0)
Alkaline Phosphatase: 77 U/L (ref 38–126)
Anion gap: 6 (ref 5–15)
BUN: 9 mg/dL (ref 6–20)
CO2: 30 mmol/L (ref 22–32)
Calcium: 9.3 mg/dL (ref 8.9–10.3)
Chloride: 103 mmol/L (ref 98–111)
Creatinine: 1 mg/dL (ref 0.61–1.24)
GFR, Est AFR Am: >60 mL/min (ref >60)
GFR, Estimated: >60 mL/min (ref >60)
Glucose, Bld: 91 mg/dL (ref 70–99)
Potassium: 3.5 mmol/L (ref 3.5–5.1)
Sodium: 139 mmol/L (ref 135–145)
Total Bilirubin: 0.3 mg/dL (ref 0.3–1.2)
Total Protein: 7.3 g/dL (ref 6.5–8.1)

## 2019-04-25 MED ORDER — ONDANSETRON HCL 8 MG PO TABS
8.0000 mg | ORAL_TABLET | Freq: Once | ORAL | Status: AC
  Administered 2019-04-25: 8 mg via ORAL

## 2019-04-25 MED ORDER — DOXORUBICIN HCL LIPOSOMAL CHEMO INJECTION 2 MG/ML
20.0000 mg/m2 | Freq: Once | INTRAVENOUS | Status: AC
  Administered 2019-04-25: 36 mg via INTRAVENOUS
  Filled 2019-04-25: qty 18

## 2019-04-25 MED ORDER — DEXAMETHASONE SODIUM PHOSPHATE 10 MG/ML IJ SOLN
INTRAMUSCULAR | Status: AC
  Filled 2019-04-25: qty 1

## 2019-04-25 MED ORDER — ONDANSETRON HCL 8 MG PO TABS
ORAL_TABLET | ORAL | Status: AC
  Filled 2019-04-25: qty 1

## 2019-04-25 MED ORDER — DEXTROSE 5 % IV SOLN
Freq: Once | INTRAVENOUS | Status: AC
  Administered 2019-04-25: 13:00:00 via INTRAVENOUS
  Filled 2019-04-25: qty 250

## 2019-04-25 MED ORDER — DEXAMETHASONE SODIUM PHOSPHATE 10 MG/ML IJ SOLN
10.0000 mg | Freq: Once | INTRAMUSCULAR | Status: AC
  Administered 2019-04-25: 10 mg via INTRAVENOUS

## 2019-04-25 NOTE — Patient Instructions (Signed)
Doxorubicin Liposomal injection What is this medicine? LIPOSOMAL DOXORUBICIN (LIP oh som al dox oh ROO bi sin) is a chemotherapy drug. This medicine is used to treat many kinds of cancer like Kaposi's sarcoma, multiple myeloma, and ovarian cancer. This medicine may be used for other purposes; ask your health care provider or pharmacist if you have questions. COMMON BRAND NAME(S): Doxil, Lipodox What should I tell my health care provider before I take this medicine? They need to know if you have any of these conditions:  blood disorders  heart disease  infection (especially a virus infection such as chickenpox, cold sores, or herpes)  liver disease  recent or ongoing radiation therapy  an unusual or allergic reaction to doxorubicin, other chemotherapy agents, soybeans, other medicines, foods, dyes, or preservatives  pregnant or trying to get pregnant  breast-feeding How should I use this medicine? This drug is given as an infusion into a vein. It is administered in a hospital or clinic by a specially trained health care professional. If you have pain, swelling, burning or any unusual feeling around the site of your injection, tell your health care professional right away. Talk to your pediatrician regarding the use of this medicine in children. Special care may be needed. Overdosage: If you think you have taken too much of this medicine contact a poison control center or emergency room at once. NOTE: This medicine is only for you. Do not share this medicine with others. What if I miss a dose? It is important not to miss your dose. Call your doctor or health care professional if you are unable to keep an appointment. What may interact with this medicine? Do not take this medicine with any of the following medications:  zidovudine This medicine may also interact with the following medications:  medicines to increase blood counts like filgrastim, pegfilgrastim,  sargramostim  vaccines Talk to your doctor or health care professional before taking any of these medicines:  acetaminophen  aspirin  ibuprofen  ketoprofen  naproxen This list may not describe all possible interactions. Give your health care provider a list of all the medicines, herbs, non-prescription drugs, or dietary supplements you use. Also tell them if you smoke, drink alcohol, or use illegal drugs. Some items may interact with your medicine. What should I watch for while using this medicine? Your condition will be monitored carefully while you are receiving this medicine. You may need blood work done while you are taking this medicine. This drug may make you feel generally unwell. This is not uncommon, as chemotherapy can affect healthy cells as well as cancer cells. Report any side effects. Continue your course of treatment even though you feel ill unless your doctor tells you to stop. Your urine may turn orange-red for a few days after your dose. This is not blood. If your urine is dark or brown, call your doctor. In some cases, you may be given additional medicines to help with side effects. Follow all directions for their use. Talk to your doctor about your risk of cancer. You may be more at risk for certain types of cancers if you take this medicine. Do not become pregnant while taking this medicine or for 6 months after stopping it. Women should inform their healthcare professional if they wish to become pregnant or think they may be pregnant. Men should not father a child while taking this medicine and for 6 months after stopping it. There is a potential for serious side effects to an unborn child.   Talk to your health care professional or pharmacist for more information. Do not breast-feed an infant while taking this medicine. This medicine has caused ovarian failure in some women. This medicine may make it more difficult to get pregnant. Talk to your healthcare professional if  you are concerned about your fertility. This medicine has caused decreased sperm counts in some men. This may make it more difficult to father a child. Talk to your healthcare professional if you are concerned about your fertility. This medicine may cause a decrease in Co-Enzyme Q-10. You should make sure that you get enough Co-Enzyme Q-10 while you are taking this medicine. Discuss the foods you eat and the vitamins you take with your health care professional. What side effects may I notice from receiving this medicine? Side effects that you should report to your doctor or health care professional as soon as possible:  allergic reactions like skin rash, itching or hives, swelling of the face, lips, or tongue  low blood counts - this medicine may decrease the number of white blood cells, red blood cells and platelets. You may be at increased risk for infections and bleeding.  signs of hand-foot syndrome - tingling or burning, redness, flaking, swelling, small blisters, or small sores on the palms of your hands or the soles of your feet  signs of infection - fever or chills, cough, sore throat, pain or difficulty passing urine  signs of decreased platelets or bleeding - bruising, pinpoint red spots on the skin, black, tarry stools, blood in the urine  signs of decreased red blood cells - unusually weak or tired, fainting spells, lightheadedness  back pain, chills, facial flushing, fever, headache, tightness in the chest or throat during the infusion  breathing problems  chest pain  fast, irregular heartbeat  mouth pain, redness, sores  pain, swelling, redness at site where injected  pain, tingling, numbness in the hands or feet  swelling of ankles, feet, or hands  vomiting Side effects that usually do not require medical attention (report to your doctor or health care professional if they continue or are bothersome):  diarrhea  hair loss  loss of appetite  nail discoloration  or damage  nausea  red or watery eyes  red colored urine  stomach upset This list may not describe all possible side effects. Call your doctor for medical advice about side effects. You may report side effects to FDA at 1-800-FDA-1088. Where should I keep my medicine? This drug is given in a hospital or clinic and will not be stored at home. NOTE: This sheet is a summary. It may not cover all possible information. If you have questions about this medicine, talk to your doctor, pharmacist, or health care provider.  2020 Elsevier/Gold Standard (2018-01-10 15:13:26)  

## 2019-04-25 NOTE — Progress Notes (Signed)
Hematology and Oncology Follow Up Visit  Todd Vincent [id number] 1976-05-02 43 y.o. 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  He looks fantastic.  He feels good.  He had a wonderful Thanksgiving.  As always, he is oriented he is dressed up very stylishly.  He has a great new hair style.  He reminds me of Todd Vincent, of the [facility].  His appetite is doing better.  He is on Marinol.  His weight is going up gradually.  He still has the ostomy.  It sounds like he will have this permanently.  He has had no problems with fever.  He has had no problems with rashes.  He has had no obvious cough or shortness of breath.  He has had no headache.  Overall, his performance status is ECOG 0.   Medications:  Allergies as of 04/25/2019      Reactions   Sulfa Antibiotics Rash, Itching   Extreme itching    Sulfasalazine Rash   Extreme itching       Medication List       Accurate as of April 25, 2019 12:21 PM. If you have any questions, ask your nurse or doctor.        STOP taking these medications   COCONUT OIL PO Stopped by: [name], MD     TAKE these medications   acyclovir 800 MG tablet Commonly known as: ZOVIRAX Take 800 mg by mouth 5 (five) times daily.   citalopram 10 MG tablet Commonly known as: CELEXA TAKE 1 TABLET(10 MG) BY MOUTH DAILY   dronabinol 5 MG capsule Commonly known as: MARINOL TAKE 1 CAPSULE BY MOUTH TWICE DAILY BEFORE LUNCH AND SUPPER   Genvoya 150-150-200-10 MG Tabs tablet Generic drug: elvitegravir-cobicistat-emtricitabine-tenofovir Take 1 tablet by mouth daily.   Glycopyrronium Tosylate 2.4 % Pads Commonly known as: Qbrexza Apply 1 application  topically daily. To each underarm.   LORazepam 0.5 MG tablet Commonly known as: ATIVAN TAKE 1 BY MOUTH EVERY 6 HOURS AS NEEDED   meclizine 25 MG tablet Commonly known as: ANTIVERT Take 1 tablet (25 mg total) by mouth every 6 (six) hours as needed for dizziness.   ondansetron 8 MG tablet Commonly known as: ZOFRAN Take 1 tablet (8 mg total) by mouth 2 (two) times daily. Start the day after chemo for 2 days. Then take as needed for nausea or vomiting.   pregabalin 75 MG capsule Commonly known as: LYRICA Take 1 capsule (75 mg total) by mouth 2 (two) times daily.   Prezista 800 MG tablet Generic drug: darunavir TAKE 1 TABLET(800 MG) BY MOUTH DAILY WITH BREAKFAST   temazepam 30 MG capsule Commonly known as: RESTORIL Take 1 capsule (30 mg total) by mouth at bedtime as needed for sleep.   triamterene-hydrochlorothiazide 37.5-25 MG tablet Commonly known as: MAXZIDE-25 TAKE 1 TABLET BY MOUTH EVERY DAY   valACYclovir 1000 MG tablet Commonly known as: VALTREX TAKE 1 TABLET(1000 MG) BY MOUTH DAILY AS NEEDED       Allergies:  Allergies  Allergen Reactions  . Sulfa Antibiotics Rash and Itching    Extreme itching   . Sulfasalazine Rash    Extreme itching     Past Medical History, Surgical history, Social history, and Family History were  reviewed and updated.  Review of Systems: Review of Systems  Constitutional: Negative.   HENT: Negative.   Eyes: Negative.   Respiratory: Negative.   Cardiovascular: Positive for leg swelling.  Gastrointestinal: Negative.   Genitourinary: Negative.   Musculoskeletal: Positive for myalgias.  Skin: Negative.   Neurological: Positive for tingling.  Endo/Heme/Allergies: Negative.   Psychiatric/Behavioral: Negative.       Physical Exam:  weight is 159 lb (72.1 kg). His oral temperature is 97.7 F (36.5 C). His blood pressure is 128/77 and his pulse is 77. His respiration is 16 and oxygen saturation is 100%.   Wt Readings from Last 3  Encounters:  04/25/19 159 lb (72.1 kg)  03/28/19 158 lb (71.7 kg)  08/04/18 154 lb (69.9 kg)    Physical Exam Vitals signs reviewed.  HENT:     Head: Normocephalic and atraumatic.  Eyes:     Pupils: Pupils are equal, round, and reactive to light.  Neck:     Musculoskeletal: Normal range of motion.  Cardiovascular:     Rate and Rhythm: Normal rate and regular rhythm.     Heart sounds: Normal heart sounds.  Pulmonary:     Effort: Pulmonary effort is normal.     Breath sounds: Normal breath sounds.  Abdominal:     General: Bowel sounds are normal.     Palpations: Abdomen is soft.     Comments: Abdominal exam shows the ostomy in the right lower quadrant.  He has good bowel sounds.  He has no fluid wave.  There is no palpable liver or spleen tip.  Musculoskeletal: Normal range of motion.        General: No tenderness or deformity.  Lymphadenopathy:     Cervical: No cervical adenopathy.  Skin:    General: Skin is warm and dry.     Findings: No erythema or rash.  Neurological:     Mental Status: He is alert and oriented to person, place, and time.  Psychiatric:        Behavior: Behavior normal.        Thought Content: Thought content normal.        Judgment: Judgment normal.      Lab Results  Component Value Date   WBC 8.6 04/25/2019   HGB 15.0 04/25/2019   HCT 43.7 04/25/2019   MCV 91.4 04/25/2019   PLT 370 04/25/2019   Lab Results  Component Value Date   FERRITIN 42 03/28/2019   IRON 81 03/28/2019   TIBC 301 03/28/2019   UIBC 220 03/28/2019   IRONPCTSAT 27 03/28/2019   Lab Results  Component Value Date   RBC 4.78 04/25/2019   No results found for: KPAFRELGTCHN, LAMBDASER, KAPLAMBRATIO No results found for: IGGSERUM, IGA, IGMSERUM No results found for: Odetta Pink, SPEI   Chemistry      Component Value Date/Time   NA 139 04/25/2019 1120   NA 141 04/19/2017 1007   NA 141 12/13/2015 0827   K 3.5  04/25/2019 1120   K 3.4 04/19/2017 1007   K 4.0 12/13/2015 0827   CL 103 04/25/2019 1120   CL 105 04/19/2017 1007   CO2 30 04/25/2019 1120   CO2 27 04/19/2017 1007   CO2 26 12/13/2015 0827   BUN 9 04/25/2019 1120   BUN 8 04/19/2017 1007   BUN 8.2 12/13/2015 0827   CREATININE 1.00 04/25/2019 1120   CREATININE 1.3 (H) 04/19/2017 1007   CREATININE 1.2 12/13/2015 0827  Component Value Date/Time   CALCIUM 9.3 04/25/2019 1120   CALCIUM 9.3 04/19/2017 1007   CALCIUM 9.2 12/13/2015 0827   ALKPHOS 77 04/25/2019 1120   ALKPHOS 82 04/19/2017 1007   ALKPHOS 78 12/13/2015 0827   AST 12 (L) 04/25/2019 1120   AST 14 12/13/2015 0827   ALT 12 04/25/2019 1120   ALT 23 04/19/2017 1007   ALT 13 12/13/2015 0827   BILITOT 0.3 04/25/2019 1120   BILITOT 0.40 12/13/2015 0827       Impression and Plan: Todd Vincent is a very pleasant 43 yo African American gentleman with Kaposi's sarcoma as well as history of rectal cancer.   We will go ahead and continue him on the Doxil.  This really has worked nicely for him.  He has had no complications from this.  He has had no issues with cardiomyopathy.  We will plan to get him back in another month.  [name], MD 12/8/202012:21 PM

## 2019-05-01 ENCOUNTER — Encounter: Payer: Self-pay | Admitting: Hematology & Oncology

## 2019-05-01 ENCOUNTER — Other Ambulatory Visit: Payer: Self-pay | Admitting: *Deleted

## 2019-05-01 MED ORDER — ALPRAZOLAM 0.5 MG PO TABS: 0.5000 mg | ORAL_TABLET | Freq: Every evening | ORAL | 0 refills | Status: DC | PRN

## 2019-05-23 ENCOUNTER — Inpatient Hospital Stay: Payer: Medicare Other

## 2019-05-23 ENCOUNTER — Inpatient Hospital Stay: Payer: Medicare Other | Attending: Hematology & Oncology | Admitting: Hematology & Oncology

## 2019-06-05 ENCOUNTER — Telehealth: Payer: Self-pay | Admitting: Hematology & Oncology

## 2019-06-05 NOTE — Telephone Encounter (Signed)
I called and LMVM for patient regarding his appointments being moved from 2/2 to 2/4 as requested per phone call 1/18

## 2019-06-13 ENCOUNTER — Other Ambulatory Visit: Payer: Self-pay | Admitting: Hematology & Oncology

## 2019-06-20 ENCOUNTER — Ambulatory Visit: Payer: Medicare Other

## 2019-06-20 ENCOUNTER — Other Ambulatory Visit: Payer: Medicare Other

## 2019-06-20 ENCOUNTER — Ambulatory Visit: Payer: Medicare Other | Admitting: Hematology & Oncology

## 2019-06-22 ENCOUNTER — Encounter: Payer: Self-pay | Admitting: Family

## 2019-06-22 ENCOUNTER — Inpatient Hospital Stay (HOSPITAL_BASED_OUTPATIENT_CLINIC_OR_DEPARTMENT_OTHER): Payer: 59 | Admitting: Family

## 2019-06-22 ENCOUNTER — Inpatient Hospital Stay: Payer: 59 | Attending: Hematology & Oncology

## 2019-06-22 ENCOUNTER — Other Ambulatory Visit: Payer: Self-pay

## 2019-06-22 ENCOUNTER — Inpatient Hospital Stay: Payer: 59

## 2019-06-22 VITALS — BP 119/79 | HR 90 | Temp 97.1°F | Resp 18 | Ht 67.0 in | Wt 161.0 lb

## 2019-06-22 DIAGNOSIS — C469 Kaposi's sarcoma, unspecified: Secondary | ICD-10-CM

## 2019-06-22 DIAGNOSIS — Z5111 Encounter for antineoplastic chemotherapy: Secondary | ICD-10-CM | POA: Insufficient documentation

## 2019-06-22 DIAGNOSIS — Z21 Asymptomatic human immunodeficiency virus [HIV] infection status: Secondary | ICD-10-CM | POA: Insufficient documentation

## 2019-06-22 DIAGNOSIS — C20 Malignant neoplasm of rectum: Secondary | ICD-10-CM

## 2019-06-22 LAB — CBC WITH DIFFERENTIAL (CANCER CENTER ONLY)
Abs Immature Granulocytes: 0.04 10*3/uL (ref 0.00–0.07)
Basophils Absolute: 0 10*3/uL (ref 0.0–0.1)
Basophils Relative: 1 %
Eosinophils Absolute: 0.3 10*3/uL (ref 0.0–0.5)
Eosinophils Relative: 4 %
HCT: 41.9 % (ref 39.0–52.0)
Hemoglobin: 14.2 g/dL (ref 13.0–17.0)
Immature Granulocytes: 1 %
Lymphocytes Relative: 32 %
Lymphs Abs: 2.4 10*3/uL (ref 0.7–4.0)
MCH: 31.7 pg (ref 26.0–34.0)
MCHC: 33.9 g/dL (ref 30.0–36.0)
MCV: 93.5 fL (ref 80.0–100.0)
Monocytes Absolute: 0.7 10*3/uL (ref 0.1–1.0)
Monocytes Relative: 9 %
Neutro Abs: 4.1 10*3/uL (ref 1.7–7.7)
Neutrophils Relative %: 53 %
Platelet Count: 307 10*3/uL (ref 150–400)
RBC: 4.48 MIL/uL (ref 4.22–5.81)
RDW: 13.6 % (ref 11.5–15.5)
WBC Count: 7.6 10*3/uL (ref 4.0–10.5)
nRBC: 0 % (ref 0.0–0.2)

## 2019-06-22 LAB — CMP (CANCER CENTER ONLY)
ALT: 11 U/L (ref 0–44)
AST: 12 U/L — ABNORMAL LOW (ref 15–41)
Albumin: 3.8 g/dL (ref 3.5–5.0)
Alkaline Phosphatase: 71 U/L (ref 38–126)
Anion gap: 8 (ref 5–15)
BUN: 9 mg/dL (ref 6–20)
CO2: 25 mmol/L (ref 22–32)
Calcium: 9 mg/dL (ref 8.9–10.3)
Chloride: 106 mmol/L (ref 98–111)
Creatinine: 1.1 mg/dL (ref 0.61–1.24)
GFR, Est AFR Am: >60 mL/min (ref >60)
GFR, Estimated: >60 mL/min (ref >60)
Glucose, Bld: 115 mg/dL — ABNORMAL HIGH (ref 70–99)
Potassium: 3.7 mmol/L (ref 3.5–5.1)
Sodium: 139 mmol/L (ref 135–145)
Total Bilirubin: 0.3 mg/dL (ref 0.3–1.2)
Total Protein: 6.7 g/dL (ref 6.5–8.1)

## 2019-06-22 MED ORDER — DOXORUBICIN HCL LIPOSOMAL CHEMO INJECTION 2 MG/ML
20.0000 mg/m2 | Freq: Once | INTRAVENOUS | Status: AC
  Administered 2019-06-22: 13:00:00 36 mg via INTRAVENOUS
  Filled 2019-06-22: qty 18

## 2019-06-22 MED ORDER — DEXTROSE 5 % IV SOLN
Freq: Once | INTRAVENOUS | Status: AC
  Filled 2019-06-22: qty 250

## 2019-06-22 MED ORDER — DEXAMETHASONE SODIUM PHOSPHATE 10 MG/ML IJ SOLN
INTRAMUSCULAR | Status: AC
  Filled 2019-06-22: qty 1

## 2019-06-22 MED ORDER — ONDANSETRON HCL 8 MG PO TABS
ORAL_TABLET | ORAL | Status: AC
  Filled 2019-06-22: qty 1

## 2019-06-22 MED ORDER — DEXAMETHASONE SODIUM PHOSPHATE 10 MG/ML IJ SOLN
10.0000 mg | Freq: Once | INTRAMUSCULAR | Status: AC
  Administered 2019-06-22: 10 mg via INTRAVENOUS

## 2019-06-22 MED ORDER — ONDANSETRON HCL 8 MG PO TABS
8.0000 mg | ORAL_TABLET | Freq: Once | ORAL | Status: AC
  Administered 2019-06-22: 8 mg via ORAL

## 2019-06-22 NOTE — Progress Notes (Signed)
Hematology and Oncology Follow Up Visit  Todd Vincent [id number] 26-Mar-1976 44 y.o. 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 He is doing well and has no complaints at his time.  He states that he is scheduled for a PET scan in March and then his surgeon will schedule a follow-up to discuss ostomy reversal if possible.  He has had no issues with his ostomy and it continues to function well.  No episodes of bleeding. No bruising or petechiae.  No fever, chills, n/v, cough, rash, dizziness, SOB, chest pain, palpitations, abdominal pain or changes in bowel or bladder habits.  The swelling in his lower extremities waxes and wanes. He notes it more when sitting and his feet are on the flood. He takes his diuretic as prescribed which does help reduce fluid retention.  He has been walking for exercise.  No falls or syncopal episodes to report.  He is eating well and supplementing with Boost or ensure. He is hydrating well and his weight is stable.   ECOG Performance Status: 1 - Symptomatic but completely ambulatory  Medications:  Allergies as of 06/22/2019      Reactions   Sulfa Antibiotics Rash, Itching   Extreme itching    Sulfasalazine Rash   Extreme itching       Medication List       Accurate as of June 22, 2019 10:54 AM. If you have any questions, ask your nurse or doctor.        acyclovir 800 MG tablet Commonly known as: ZOVIRAX Take 800 mg by mouth 5 (five) times daily.   ALPRAZolam 0.5 MG tablet Commonly known as: XANAX Take 1 tablet (0.5 mg total) by mouth at bedtime as needed for anxiety.   citalopram 10 MG tablet Commonly known as: CELEXA TAKE 1 TABLET(10 MG) BY MOUTH DAILY     dronabinol 5 MG capsule Commonly known as: MARINOL TAKE 1 CAPSULE BY MOUTH TWICE DAILY BEFORE LUNCH AND SUPPER   Genvoya 150-150-200-10 MG Tabs tablet Generic drug: elvitegravir-cobicistat-emtricitabine-tenofovir Take 1 tablet by mouth daily.   Glycopyrronium Tosylate 2.4 % Pads Commonly known as: Qbrexza Apply 1 application topically daily. To each underarm.   LORazepam 0.5 MG tablet Commonly known as: ATIVAN TAKE 1 BY MOUTH EVERY 6 HOURS AS NEEDED   meclizine 25 MG tablet Commonly known as: ANTIVERT Take 1 tablet (25 mg total) by mouth every 6 (six) hours as needed for dizziness.   ondansetron 8 MG tablet Commonly known as: ZOFRAN Take 1 tablet (8 mg total) by mouth 2 (two) times daily. Start the day after chemo for 2 days. Then take as needed for nausea or vomiting.   pregabalin 75 MG capsule Commonly known as: LYRICA Take 1 capsule (75 mg total) by mouth 2 (two) times daily.   Prezista 800 MG tablet Generic drug: darunavir TAKE 1 TABLET(800 MG) BY MOUTH DAILY WITH BREAKFAST   temazepam 30 MG capsule Commonly known as: RESTORIL Take 1 capsule (30 mg total) by mouth at bedtime as needed for sleep.   triamterene-hydrochlorothiazide 37.5-25 MG tablet Commonly known as: MAXZIDE-25 TAKE 1 TABLET BY MOUTH EVERY DAY   valACYclovir 1000 MG tablet Commonly known as: VALTREX TAKE 1 TABLET(1000 MG) BY MOUTH DAILY AS NEEDED  Allergies:  Allergies  Allergen Reactions  . Sulfa Antibiotics Rash and Itching    Extreme itching   . Sulfasalazine Rash    Extreme itching     Past Medical History, Surgical history, Social history, and Family History were reviewed and updated.  Review of Systems: All other 10 point review of systems is negative.   Physical Exam:  vitals were not taken for this visit.   Wt Readings from Last 3 Encounters:  04/25/19 159 lb (72.1 kg)  03/28/19 158 lb (71.7 kg)  08/04/18 154 lb (69.9 kg)    Ocular: Sclerae unicteric, pupils  equal, round and reactive to light Ear-nose-throat: Oropharynx clear, dentition fair Lymphatic: No cervical or supraclavicular adenopathy Lungs no rales or rhonchi, good excursion bilaterally Heart regular rate and rhythm, no murmur appreciated Abd soft, nontender, positive bowel sounds, no liver or spleen tip palpated on exam, no fluid wave  MSK no focal spinal tenderness, no joint edema Neuro: non-focal, well-oriented, appropriate affect Breasts: Deferred   Lab Results  Component Value Date   WBC 8.6 04/25/2019   HGB 15.0 04/25/2019   HCT 43.7 04/25/2019   MCV 91.4 04/25/2019   PLT 370 04/25/2019   Lab Results  Component Value Date   FERRITIN 42 03/28/2019   IRON 81 03/28/2019   TIBC 301 03/28/2019   UIBC 220 03/28/2019   IRONPCTSAT 27 03/28/2019   Lab Results  Component Value Date   RBC 4.78 04/25/2019   No results found for: KPAFRELGTCHN, LAMBDASER, KAPLAMBRATIO No results found for: IGGSERUM, IGA, IGMSERUM No results found for: Kathrynn Ducking, MSPIKE, SPEI   Chemistry      Component Value Date/Time   NA 139 04/25/2019 1120   NA 141 04/19/2017 1007   NA 141 12/13/2015 0827   K 3.5 04/25/2019 1120   K 3.4 04/19/2017 1007   K 4.0 12/13/2015 0827   CL 103 04/25/2019 1120   CL 105 04/19/2017 1007   CO2 30 04/25/2019 1120   CO2 27 04/19/2017 1007   CO2 26 12/13/2015 0827   BUN 9 04/25/2019 1120   BUN 8 04/19/2017 1007   BUN 8.2 12/13/2015 0827   CREATININE 1.00 04/25/2019 1120   CREATININE 1.3 (H) 04/19/2017 1007   CREATININE 1.2 12/13/2015 0827      Component Value Date/Time   CALCIUM 9.3 04/25/2019 1120   CALCIUM 9.3 04/19/2017 1007   CALCIUM 9.2 12/13/2015 0827   ALKPHOS 77 04/25/2019 1120   ALKPHOS 82 04/19/2017 1007   ALKPHOS 78 12/13/2015 0827   AST 12 (L) 04/25/2019 1120   AST 14 12/13/2015 0827   ALT 12 04/25/2019 1120   ALT 23 04/19/2017 1007   ALT 13 12/13/2015 0827   BILITOT 0.3 04/25/2019 1120    BILITOT 0.40 12/13/2015 0827       Impression and Plan: Todd Vincent is a very pleasant 44 yo African American gentleman with Kaposi's sarcoma as well as history of rectal cancer.  We will proceed with Doxil today as planned. He continues to do quite well.  We will plan to see him back in another month.  He will contact our office with any questions or concerns. We can certainly see him sooner if needed.   Laverna Peace, NP 2/4/202110:54 AM

## 2019-06-22 NOTE — Patient Instructions (Signed)
Doxorubicin Liposomal injection What is this medicine? LIPOSOMAL DOXORUBICIN (LIP oh som al dox oh ROO bi sin) is a chemotherapy drug. This medicine is used to treat many kinds of cancer like Kaposi's sarcoma, multiple myeloma, and ovarian cancer. This medicine may be used for other purposes; ask your health care provider or pharmacist if you have questions. COMMON BRAND NAME(S): Doxil, Lipodox What should I tell my health care provider before I take this medicine? They need to know if you have any of these conditions:  blood disorders  heart disease  infection (especially a virus infection such as chickenpox, cold sores, or herpes)  liver disease  recent or ongoing radiation therapy  an unusual or allergic reaction to doxorubicin, other chemotherapy agents, soybeans, other medicines, foods, dyes, or preservatives  pregnant or trying to get pregnant  breast-feeding How should I use this medicine? This drug is given as an infusion into a vein. It is administered in a hospital or clinic by a specially trained health care professional. If you have pain, swelling, burning or any unusual feeling around the site of your injection, tell your health care professional right away. Talk to your pediatrician regarding the use of this medicine in children. Special care may be needed. Overdosage: If you think you have taken too much of this medicine contact a poison control center or emergency room at once. NOTE: This medicine is only for you. Do not share this medicine with others. What if I miss a dose? It is important not to miss your dose. Call your doctor or health care professional if you are unable to keep an appointment. What may interact with this medicine? Do not take this medicine with any of the following medications:  zidovudine This medicine may also interact with the following medications:  medicines to increase blood counts like filgrastim, pegfilgrastim,  sargramostim  vaccines Talk to your doctor or health care professional before taking any of these medicines:  acetaminophen  aspirin  ibuprofen  ketoprofen  naproxen This list may not describe all possible interactions. Give your health care provider a list of all the medicines, herbs, non-prescription drugs, or dietary supplements you use. Also tell them if you smoke, drink alcohol, or use illegal drugs. Some items may interact with your medicine. What should I watch for while using this medicine? Your condition will be monitored carefully while you are receiving this medicine. You may need blood work done while you are taking this medicine. This drug may make you feel generally unwell. This is not uncommon, as chemotherapy can affect healthy cells as well as cancer cells. Report any side effects. Continue your course of treatment even though you feel ill unless your doctor tells you to stop. Your urine may turn orange-red for a few days after your dose. This is not blood. If your urine is dark or brown, call your doctor. In some cases, you may be given additional medicines to help with side effects. Follow all directions for their use. Talk to your doctor about your risk of cancer. You may be more at risk for certain types of cancers if you take this medicine. Do not become pregnant while taking this medicine or for 6 months after stopping it. Women should inform their healthcare professional if they wish to become pregnant or think they may be pregnant. Men should not father a child while taking this medicine and for 6 months after stopping it. There is a potential for serious side effects to an unborn child.   Talk to your health care professional or pharmacist for more information. Do not breast-feed an infant while taking this medicine. This medicine has caused ovarian failure in some women. This medicine may make it more difficult to get pregnant. Talk to your healthcare professional if  you are concerned about your fertility. This medicine has caused decreased sperm counts in some men. This may make it more difficult to father a child. Talk to your healthcare professional if you are concerned about your fertility. This medicine may cause a decrease in Co-Enzyme Q-10. You should make sure that you get enough Co-Enzyme Q-10 while you are taking this medicine. Discuss the foods you eat and the vitamins you take with your health care professional. What side effects may I notice from receiving this medicine? Side effects that you should report to your doctor or health care professional as soon as possible:  allergic reactions like skin rash, itching or hives, swelling of the face, lips, or tongue  low blood counts - this medicine may decrease the number of white blood cells, red blood cells and platelets. You may be at increased risk for infections and bleeding.  signs of hand-foot syndrome - tingling or burning, redness, flaking, swelling, small blisters, or small sores on the palms of your hands or the soles of your feet  signs of infection - fever or chills, cough, sore throat, pain or difficulty passing urine  signs of decreased platelets or bleeding - bruising, pinpoint red spots on the skin, black, tarry stools, blood in the urine  signs of decreased red blood cells - unusually weak or tired, fainting spells, lightheadedness  back pain, chills, facial flushing, fever, headache, tightness in the chest or throat during the infusion  breathing problems  chest pain  fast, irregular heartbeat  mouth pain, redness, sores  pain, swelling, redness at site where injected  pain, tingling, numbness in the hands or feet  swelling of ankles, feet, or hands  vomiting Side effects that usually do not require medical attention (report to your doctor or health care professional if they continue or are bothersome):  diarrhea  hair loss  loss of appetite  nail discoloration  or damage  nausea  red or watery eyes  red colored urine  stomach upset This list may not describe all possible side effects. Call your doctor for medical advice about side effects. You may report side effects to FDA at 1-800-FDA-1088. Where should I keep my medicine? This drug is given in a hospital or clinic and will not be stored at home. NOTE: This sheet is a summary. It may not cover all possible information. If you have questions about this medicine, talk to your doctor, pharmacist, or health care provider.  2020 Elsevier/Gold Standard (2018-01-10 15:13:26)  

## 2019-06-23 LAB — LACTATE DEHYDROGENASE: LDH: 171 U/L (ref 98–192)

## 2019-07-04 ENCOUNTER — Other Ambulatory Visit: Payer: Self-pay | Admitting: Hematology & Oncology

## 2019-07-04 DIAGNOSIS — B029 Zoster without complications: Secondary | ICD-10-CM

## 2019-07-04 DIAGNOSIS — C469 Kaposi's sarcoma, unspecified: Secondary | ICD-10-CM

## 2019-07-20 ENCOUNTER — Inpatient Hospital Stay: Payer: 59

## 2019-07-20 ENCOUNTER — Inpatient Hospital Stay: Payer: 59 | Admitting: Family

## 2019-07-25 ENCOUNTER — Other Ambulatory Visit: Payer: 59

## 2019-07-25 ENCOUNTER — Ambulatory Visit: Payer: 59 | Admitting: Family

## 2019-07-25 ENCOUNTER — Ambulatory Visit: Payer: 59

## 2019-08-07 ENCOUNTER — Other Ambulatory Visit (HOSPITAL_COMMUNITY)
Admission: RE | Admit: 2019-08-07 | Discharge: 2019-08-07 | Disposition: A | Discharge status: Home / Self Care | Payer: 59 | Source: Ambulatory Visit | Attending: Internal Medicine | Admitting: Internal Medicine

## 2019-08-07 ENCOUNTER — Other Ambulatory Visit: Payer: Self-pay

## 2019-08-07 ENCOUNTER — Other Ambulatory Visit: Payer: 59

## 2019-08-07 DIAGNOSIS — Z21 Asymptomatic human immunodeficiency virus [HIV] infection status: Secondary | ICD-10-CM | POA: Diagnosis present

## 2019-08-07 DIAGNOSIS — Z79899 Other long term (current) drug therapy: Secondary | ICD-10-CM

## 2019-08-07 DIAGNOSIS — Z113 Encounter for screening for infections with a predominantly sexual mode of transmission: Secondary | ICD-10-CM

## 2019-08-08 LAB — T-HELPER CELL (CD4) - (RCID CLINIC ONLY)
CD4 % Helper T Cell: 15 % — ABNORMAL LOW (ref 33–65)
CD4 T Cell Abs: 458 /uL (ref 400–1790)

## 2019-08-08 LAB — URINE CYTOLOGY ANCILLARY ONLY
Chlamydia: NEGATIVE
Comment: NEGATIVE
Comment: NORMAL
Neisseria Gonorrhea: NEGATIVE

## 2019-08-09 LAB — CBC WITH DIFFERENTIAL/PLATELET
Absolute Monocytes: 653 cells/uL (ref 200–950)
Basophils Absolute: 37 cells/uL (ref 0–200)
Basophils Relative: 0.4 %
Eosinophils Absolute: 340 cells/uL (ref 15–500)
Eosinophils Relative: 3.7 %
HCT: 45.2 % (ref 38.5–50.0)
Hemoglobin: 15.8 g/dL (ref 13.2–17.1)
Lymphs Abs: 3220 cells/uL (ref 850–3900)
MCH: 32.8 pg (ref 27.0–33.0)
MCHC: 35 g/dL (ref 32.0–36.0)
MCV: 94 fL (ref 80.0–100.0)
MPV: 9.2 fL (ref 7.5–12.5)
Monocytes Relative: 7.1 %
Neutro Abs: 4950 cells/uL (ref 1500–7800)
Neutrophils Relative %: 53.8 %
Platelets: 312 10*3/uL (ref 140–400)
RBC: 4.81 10*6/uL (ref 4.20–5.80)
RDW: 12.7 % (ref 11.0–15.0)
Total Lymphocyte: 35 %
WBC: 9.2 10*3/uL (ref 3.8–10.8)

## 2019-08-09 LAB — LIPID PANEL
Cholesterol: 145 mg/dL (ref <200)
HDL: 51 mg/dL (ref >=40)
LDL Cholesterol (Calc): 78 mg/dL (calc)
Non-HDL Cholesterol (Calc): 94 mg/dL (calc) (ref <130)
Total CHOL/HDL Ratio: 2.8 (calc) (ref <5.0)
Triglycerides: 82 mg/dL (ref <150)

## 2019-08-09 LAB — COMPLETE METABOLIC PANEL WITH GFR
AG Ratio: 1.4 (calc) (ref 1.0–2.5)
ALT: 16 U/L (ref 9–46)
AST: 15 U/L (ref 10–40)
Albumin: 4 g/dL (ref 3.6–5.1)
Alkaline phosphatase (APISO): 76 U/L (ref 36–130)
BUN: 10 mg/dL (ref 7–25)
CO2: 30 mmol/L (ref 20–32)
Calcium: 9.2 mg/dL (ref 8.6–10.3)
Chloride: 102 mmol/L (ref 98–110)
Creat: 1.09 mg/dL (ref 0.60–1.35)
GFR, Est African American: 97 mL/min/{1.73_m2} (ref >=60)
GFR, Est Non African American: 83 mL/min/{1.73_m2} (ref >=60)
Globulin: 2.9 g/dL (calc) (ref 1.9–3.7)
Glucose, Bld: 131 mg/dL — ABNORMAL HIGH (ref 65–99)
Potassium: 3.7 mmol/L (ref 3.5–5.3)
Sodium: 138 mmol/L (ref 135–146)
Total Bilirubin: 0.5 mg/dL (ref 0.2–1.2)
Total Protein: 6.9 g/dL (ref 6.1–8.1)

## 2019-08-09 LAB — HIV-1 RNA QUANT-NO REFLEX-BLD
HIV 1 RNA Quant: 524 copies/mL — ABNORMAL HIGH
HIV-1 RNA Quant, Log: 2.72 Log copies/mL — ABNORMAL HIGH

## 2019-08-09 LAB — RPR: RPR Ser Ql: NONREACTIVE

## 2019-08-14 NOTE — Progress Notes (Unsigned)
Pharmacist Chemotherapy Monitoring - Follow Up Assessment    I verify that I have reviewed each item in the below checklist:  . Regimen for the patient is scheduled for the appropriate day and plan matches scheduled date. Marland Kitchen Appropriate non-routine labs are ordered dependent on drug ordered. . If applicable, additional medications reviewed and ordered per protocol based on lifetime cumulative doses and/or treatment regimen.   Plan for follow-up and/or issues identified: Yes . I-vent associated with next due treatment: Yes . MD and/or nursing notified: Yes  Todd Vincent, Todd Vincent 08/14/2019 8:06 AM

## 2019-08-21 ENCOUNTER — Encounter: Payer: Self-pay | Admitting: Internal Medicine

## 2019-08-21 ENCOUNTER — Other Ambulatory Visit: Payer: Self-pay

## 2019-08-21 ENCOUNTER — Encounter: Payer: Self-pay | Admitting: Family

## 2019-08-21 ENCOUNTER — Telehealth: Payer: Self-pay | Admitting: Family

## 2019-08-21 ENCOUNTER — Inpatient Hospital Stay: Payer: 59

## 2019-08-21 ENCOUNTER — Inpatient Hospital Stay (HOSPITAL_BASED_OUTPATIENT_CLINIC_OR_DEPARTMENT_OTHER): Payer: 59 | Admitting: Family

## 2019-08-21 ENCOUNTER — Inpatient Hospital Stay: Payer: 59 | Attending: Hematology & Oncology

## 2019-08-21 ENCOUNTER — Ambulatory Visit (INDEPENDENT_AMBULATORY_CARE_PROVIDER_SITE_OTHER): Payer: 59 | Admitting: Internal Medicine

## 2019-08-21 VITALS — BP 131/98 | HR 84 | Temp 97.7°F | Resp 18 | Ht 67.0 in | Wt 157.0 lb

## 2019-08-21 VITALS — BP 128/74 | HR 89 | Temp 98.4°F | Ht 67.0 in | Wt 157.0 lb

## 2019-08-21 VITALS — BP 113/78 | HR 98 | Resp 17

## 2019-08-21 DIAGNOSIS — Z23 Encounter for immunization: Secondary | ICD-10-CM | POA: Diagnosis not present

## 2019-08-21 DIAGNOSIS — B20 Human immunodeficiency virus [HIV] disease: Secondary | ICD-10-CM | POA: Diagnosis not present

## 2019-08-21 DIAGNOSIS — Z5111 Encounter for antineoplastic chemotherapy: Secondary | ICD-10-CM | POA: Diagnosis present

## 2019-08-21 DIAGNOSIS — C469 Kaposi's sarcoma, unspecified: Secondary | ICD-10-CM

## 2019-08-21 DIAGNOSIS — Z21 Asymptomatic human immunodeficiency virus [HIV] infection status: Secondary | ICD-10-CM | POA: Diagnosis not present

## 2019-08-21 DIAGNOSIS — C20 Malignant neoplasm of rectum: Secondary | ICD-10-CM | POA: Insufficient documentation

## 2019-08-21 LAB — CMP (CANCER CENTER ONLY)
ALT: 11 U/L (ref 0–44)
AST: 11 U/L — ABNORMAL LOW (ref 15–41)
Albumin: 4.1 g/dL (ref 3.5–5.0)
Alkaline Phosphatase: 78 U/L (ref 38–126)
Anion gap: 5 (ref 5–15)
BUN: 8 mg/dL (ref 6–20)
CO2: 27 mmol/L (ref 22–32)
Calcium: 9.3 mg/dL (ref 8.9–10.3)
Chloride: 106 mmol/L (ref 98–111)
Creatinine: 0.91 mg/dL (ref 0.61–1.24)
GFR, Est AFR Am: >60 mL/min (ref >60)
GFR, Estimated: >60 mL/min (ref >60)
Glucose, Bld: 92 mg/dL (ref 70–99)
Potassium: 3.9 mmol/L (ref 3.5–5.1)
Sodium: 138 mmol/L (ref 135–145)
Total Bilirubin: 0.3 mg/dL (ref 0.3–1.2)
Total Protein: 6.9 g/dL (ref 6.5–8.1)

## 2019-08-21 LAB — CBC WITH DIFFERENTIAL (CANCER CENTER ONLY)
Abs Immature Granulocytes: 0.05 10*3/uL (ref 0.00–0.07)
Basophils Absolute: 0 10*3/uL (ref 0.0–0.1)
Basophils Relative: 0 %
Eosinophils Absolute: 0.3 10*3/uL (ref 0.0–0.5)
Eosinophils Relative: 3 %
HCT: 44 % (ref 39.0–52.0)
Hemoglobin: 15 g/dL (ref 13.0–17.0)
Immature Granulocytes: 1 %
Lymphocytes Relative: 23 %
Lymphs Abs: 2.5 10*3/uL (ref 0.7–4.0)
MCH: 31.6 pg (ref 26.0–34.0)
MCHC: 34.1 g/dL (ref 30.0–36.0)
MCV: 92.8 fL (ref 80.0–100.0)
Monocytes Absolute: 0.6 10*3/uL (ref 0.1–1.0)
Monocytes Relative: 6 %
Neutro Abs: 7.3 10*3/uL (ref 1.7–7.7)
Neutrophils Relative %: 67 %
Platelet Count: 299 10*3/uL (ref 150–400)
RBC: 4.74 MIL/uL (ref 4.22–5.81)
RDW: 12.5 % (ref 11.5–15.5)
WBC Count: 10.8 10*3/uL — ABNORMAL HIGH (ref 4.0–10.5)
nRBC: 0 % (ref 0.0–0.2)

## 2019-08-21 LAB — LACTATE DEHYDROGENASE: LDH: 227 U/L — ABNORMAL HIGH (ref 98–192)

## 2019-08-21 MED ORDER — DEXTROSE 5 % IV SOLN
Freq: Once | INTRAVENOUS | Status: AC
  Filled 2019-08-21: qty 250

## 2019-08-21 MED ORDER — ONDANSETRON HCL 8 MG PO TABS
8.0000 mg | ORAL_TABLET | Freq: Once | ORAL | Status: AC
  Administered 2019-08-21: 8 mg via ORAL

## 2019-08-21 MED ORDER — DOXORUBICIN HCL LIPOSOMAL CHEMO INJECTION 2 MG/ML
20.0000 mg/m2 | Freq: Once | INTRAVENOUS | Status: AC
  Administered 2019-08-21: 36 mg via INTRAVENOUS
  Filled 2019-08-21: qty 18

## 2019-08-21 MED ORDER — DEXAMETHASONE SODIUM PHOSPHATE 10 MG/ML IJ SOLN
INTRAMUSCULAR | Status: AC
  Filled 2019-08-21: qty 1

## 2019-08-21 MED ORDER — SODIUM CHLORIDE 0.9 % IV SOLN
10.0000 mg | Freq: Once | INTRAVENOUS | Status: AC
  Administered 2019-08-21: 10 mg via INTRAVENOUS
  Filled 2019-08-21: qty 1

## 2019-08-21 MED ORDER — ONDANSETRON HCL 8 MG PO TABS
ORAL_TABLET | ORAL | Status: AC
  Filled 2019-08-21: qty 1

## 2019-08-21 NOTE — Telephone Encounter (Signed)
Appointments scheduled calendar printed per 4/5 los/ Patient will get appt info from My Chart

## 2019-08-21 NOTE — Assessment & Plan Note (Signed)
He is doing well with this but his viral load is up more than it should be.  I will have it rechecked today with a genotype to be sure no new mutations.  He assured compliance so hopefully will be suppressed again.  rtc 6 months.

## 2019-08-21 NOTE — Progress Notes (Signed)
   Subjective:    Patient ID: Todd Vincent, male    DOB: Apr 29, 1976, 44 y.o.   MRN: [id number]  HPI Here for follow up of HIV He continues on Genvoya and prezista and no missed doses. CD4 of 458 and viral load 524, which is up from his usually suppressed virus. No new issues otherwise.  No new surgeries.  No complaints.      Review of Systems  Constitutional: Negative for fatigue.  Gastrointestinal: Negative for nausea.  Skin: Negative for rash.       Objective:   Physical Exam  Constitutional: He appears well-developed and well-nourished. No distress.  Cardiovascular: Normal rate, regular rhythm and normal heart sounds.  No murmur heard. Pulmonary/Chest: Effort normal and breath sounds normal. No respiratory distress.  Skin: No rash noted.  Psychiatric: He has a normal mood and affect.   SH: former smoker       Assessment & Plan:

## 2019-08-21 NOTE — Progress Notes (Signed)
Hematology and Oncology Follow Up Visit  Todd Vincent [id number] May 17, 1976 44 y.o. 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 He is doing well and has no complaints at this time.  He had a PET scan with Todd Vincent in February which was stable.  His ostomy is functioning appropriately.  No bleeding, brusing or petechiae.  He has some hyperpigented areas on the arms and legs but no new lesions noted.  No fever, chills, n/v, cough, rash, dizziness, SOB, chest pain, palpitations, abdominal pain or changes in bladder habits.  The swelling in his lower extremities is stable/unchanged.  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  No falls or syncopal episodes to report.  He has maintained a good appetite and continues to hydrate well. His weight is stable.   ECOG Performance Status: 1 - Symptomatic but completely ambulatory  Medications:  Allergies as of 08/21/2019      Reactions   Sulfa Antibiotics Rash, Itching   Extreme itching    Sulfasalazine Itching, Rash      Medication List       Accurate as of August 21, 2019 11:27 AM. If you have any questions, ask your nurse or doctor.        STOP taking these medications   temazepam 30 MG capsule Commonly known as: RESTORIL Stopped by: Thayer Headings, MD     TAKE these medications   acyclovir 800 MG tablet Commonly known as: ZOVIRAX Take 800 mg by mouth 5 (five) times daily.   ALPRAZolam 0.5 MG tablet Commonly known as: XANAX Take 1 tablet (0.5 mg total) by mouth at bedtime as needed for anxiety.   citalopram 10 MG tablet Commonly known as: CELEXA TAKE 1 TABLET(10 MG) BY MOUTH DAILY   dronabinol 5 MG capsule Commonly known  as: MARINOL TAKE 1 CAPSULE BY MOUTH TWICE DAILY BEFORE LUNCH AND SUPPER   Genvoya 150-150-200-10 MG Tabs tablet Generic drug: elvitegravir-cobicistat-emtricitabine-tenofovir Take 1 tablet by mouth daily.   Glycopyrronium Tosylate 2.4 % Pads Commonly known as: Qbrexza Apply 1 application topically daily. To each underarm.   LORazepam 0.5 MG tablet Commonly known as: ATIVAN TAKE 1 BY MOUTH EVERY 6 HOURS AS NEEDED   meclizine 25 MG tablet Commonly known as: ANTIVERT Take 1 tablet (25 mg total) by mouth every 6 (six) hours as needed for dizziness.   ondansetron 8 MG tablet Commonly known as: ZOFRAN Take 1 tablet (8 mg total) by mouth 2 (two) times daily. Start the day after chemo for 2 days. Then take as needed for nausea or vomiting.   pregabalin 75 MG capsule Commonly known as: LYRICA Take 1 capsule (75 mg total) by mouth 2 (two) times daily.   Prezista 800 MG tablet Generic drug: darunavir TAKE 1 TABLET(800 MG) BY MOUTH DAILY WITH BREAKFAST   triamterene-hydrochlorothiazide 37.5-25 MG tablet Commonly known as: MAXZIDE-25 TAKE 1 TABLET BY MOUTH EVERY DAY   valACYclovir 1000 MG tablet Commonly known as: VALTREX TAKE 1 TABLET(1000 MG) BY MOUTH DAILY AS NEEDED       Allergies:  Allergies  Allergen Reactions  . Sulfa Antibiotics Rash and Itching    Extreme itching   . Sulfasalazine Itching and Rash    Past Medical  History, Surgical history, Social history, and Family History were reviewed and updated.  Review of Systems: All other 10 point review of systems is negative.   Physical Exam:  vitals were not taken for this visit.   Wt Readings from Last 3 Encounters:  08/21/19 157 lb (71.2 kg)  06/22/19 161 lb (73 kg)  04/25/19 159 lb (72.1 kg)    Ocular: Sclerae unicteric, pupils equal, round and reactive to light Ear-nose-throat: Oropharynx clear, dentition fair Lymphatic: No cervical, supraclavicular or axillary adenopathy Lungs no rales or rhonchi, good  excursion bilaterally Heart regular rate and rhythm, no murmur appreciated Abd soft, nontender, positive bowel sounds, no liver or spleen tip palpated on exam, no fluid wave  MSK no focal spinal tenderness, no joint edema Neuro: non-focal, well-oriented, appropriate affect Breasts: Deferred   Lab Results  Component Value Date   WBC 10.8 (H) 08/21/2019   HGB 15.0 08/21/2019   HCT 44.0 08/21/2019   MCV 92.8 08/21/2019   PLT 299 08/21/2019   Lab Results  Component Value Date   FERRITIN 42 03/28/2019   IRON 81 03/28/2019   TIBC 301 03/28/2019   UIBC 220 03/28/2019   IRONPCTSAT 27 03/28/2019   Lab Results  Component Value Date   RBC 4.74 08/21/2019   No results found for: KPAFRELGTCHN, LAMBDASER, KAPLAMBRATIO No results found for: IGGSERUM, IGA, IGMSERUM No results found for: Kathrynn Ducking, MSPIKE, SPEI   Chemistry      Component Value Date/Time   NA 138 08/07/2019 0928   NA 141 04/19/2017 1007   NA 141 12/13/2015 0827   K 3.7 08/07/2019 0928   K 3.4 04/19/2017 1007   K 4.0 12/13/2015 0827   CL 102 08/07/2019 0928   CL 105 04/19/2017 1007   CO2 30 08/07/2019 0928   CO2 27 04/19/2017 1007   CO2 26 12/13/2015 0827   BUN 10 08/07/2019 0928   BUN 8 04/19/2017 1007   BUN 8.2 12/13/2015 0827   CREATININE 1.09 08/07/2019 0928   CREATININE 1.2 12/13/2015 0827      Component Value Date/Time   CALCIUM 9.2 08/07/2019 0928   CALCIUM 9.3 04/19/2017 1007   CALCIUM 9.2 12/13/2015 0827   ALKPHOS 71 06/22/2019 1046   ALKPHOS 82 04/19/2017 1007   ALKPHOS 78 12/13/2015 0827   AST 15 08/07/2019 0928   AST 12 (L) 06/22/2019 1046   AST 14 12/13/2015 0827   ALT 16 08/07/2019 0928   ALT 11 06/22/2019 1046   ALT 23 04/19/2017 1007   ALT 13 12/13/2015 0827   BILITOT 0.5 08/07/2019 0928   BILITOT 0.3 06/22/2019 1046   BILITOT 0.40 12/13/2015 0827       Impression and Plan: Todd Vincent is a very pleasant 44 yo African American gentleman  with Kaposi's sarcoma as well as history of rectal cancer.  We will proceed with Doxil today as planned and see him again in one month.  He will contact our office with any questions or concerns. We can certainly see him sooner if needed.   Laverna Peace, NP 4/5/202111:27 AM

## 2019-08-21 NOTE — Assessment & Plan Note (Signed)
He is back on chemo and tolerating.

## 2019-08-21 NOTE — Assessment & Plan Note (Signed)
I discussed and emphasized the importance of the COVID vaccine for him and he will schedule.

## 2019-08-24 ENCOUNTER — Other Ambulatory Visit: Payer: Self-pay | Admitting: Hematology & Oncology

## 2019-08-24 DIAGNOSIS — C469 Kaposi's sarcoma, unspecified: Secondary | ICD-10-CM

## 2019-08-24 DIAGNOSIS — B029 Zoster without complications: Secondary | ICD-10-CM

## 2019-08-24 LAB — HIV-1 RNA ULTRAQUANT REFLEX TO GENTYP+
HIV 1 RNA Quant: 24 copies/mL — ABNORMAL HIGH
HIV-1 RNA Quant, Log: 1.38 Log copies/mL — ABNORMAL HIGH

## 2019-09-11 ENCOUNTER — Encounter: Payer: Self-pay | Admitting: Family

## 2019-09-14 NOTE — Progress Notes (Signed)
Pharmacist Chemotherapy Monitoring - Follow Up Assessment    I verify that I have reviewed each item in the below checklist:  . Regimen for the patient is scheduled for the appropriate day and plan matches scheduled date. Marland Kitchen Appropriate non-routine labs are ordered dependent on drug ordered. . If applicable, additional medications reviewed and ordered per protocol based on lifetime cumulative doses and/or treatment regimen.   Plan for follow-up and/or issues identified: Yes . I-vent associated with next due treatment: Yes . MD and/or nursing notified: Yes  Lorieann Argueta, [name] 09/14/2019 2:21 PM

## 2019-09-19 ENCOUNTER — Ambulatory Visit: Payer: 59 | Admitting: Hematology & Oncology

## 2019-09-19 ENCOUNTER — Ambulatory Visit: Payer: 59

## 2019-09-19 ENCOUNTER — Other Ambulatory Visit: Payer: 59

## 2019-09-19 ENCOUNTER — Other Ambulatory Visit: Payer: Self-pay | Admitting: Internal Medicine

## 2019-09-19 ENCOUNTER — Other Ambulatory Visit: Payer: Self-pay

## 2019-09-19 ENCOUNTER — Other Ambulatory Visit: Payer: Self-pay | Admitting: Hematology & Oncology

## 2019-09-19 DIAGNOSIS — B20 Human immunodeficiency virus [HIV] disease: Secondary | ICD-10-CM

## 2019-09-19 DIAGNOSIS — C469 Kaposi's sarcoma, unspecified: Secondary | ICD-10-CM

## 2019-09-19 DIAGNOSIS — B029 Zoster without complications: Secondary | ICD-10-CM

## 2019-09-20 MED ORDER — DRONABINOL 5 MG PO CAPS: ORAL_CAPSULE | ORAL | 0 refills | Status: DC

## 2019-09-21 ENCOUNTER — Other Ambulatory Visit: Payer: Self-pay

## 2019-09-21 ENCOUNTER — Inpatient Hospital Stay: Payer: Medicare Other

## 2019-09-21 ENCOUNTER — Inpatient Hospital Stay: Payer: Medicare Other | Attending: Hematology & Oncology

## 2019-09-21 ENCOUNTER — Encounter: Payer: Self-pay | Admitting: Family

## 2019-09-21 ENCOUNTER — Inpatient Hospital Stay (HOSPITAL_BASED_OUTPATIENT_CLINIC_OR_DEPARTMENT_OTHER): Payer: Medicare Other | Admitting: Family

## 2019-09-21 VITALS — BP 135/86 | HR 84 | Temp 97.7°F | Resp 18 | Ht 67.0 in | Wt 149.0 lb

## 2019-09-21 DIAGNOSIS — Z21 Asymptomatic human immunodeficiency virus [HIV] infection status: Secondary | ICD-10-CM | POA: Insufficient documentation

## 2019-09-21 DIAGNOSIS — C469 Kaposi's sarcoma, unspecified: Secondary | ICD-10-CM | POA: Diagnosis present

## 2019-09-21 DIAGNOSIS — Z5111 Encounter for antineoplastic chemotherapy: Secondary | ICD-10-CM | POA: Diagnosis not present

## 2019-09-21 DIAGNOSIS — C20 Malignant neoplasm of rectum: Secondary | ICD-10-CM | POA: Diagnosis present

## 2019-09-21 DIAGNOSIS — R42 Dizziness and giddiness: Secondary | ICD-10-CM | POA: Insufficient documentation

## 2019-09-21 LAB — CMP (CANCER CENTER ONLY)
ALT: 10 U/L (ref 0–44)
AST: 11 U/L — ABNORMAL LOW (ref 15–41)
Albumin: 4.2 g/dL (ref 3.5–5.0)
Alkaline Phosphatase: 84 U/L (ref 38–126)
Anion gap: 5 (ref 5–15)
BUN: 8 mg/dL (ref 6–20)
CO2: 30 mmol/L (ref 22–32)
Calcium: 10 mg/dL (ref 8.9–10.3)
Chloride: 105 mmol/L (ref 98–111)
Creatinine: 1.16 mg/dL (ref 0.61–1.24)
GFR, Est AFR Am: >60 mL/min (ref >60)
GFR, Estimated: >60 mL/min (ref >60)
Glucose, Bld: 90 mg/dL (ref 70–99)
Potassium: 4.2 mmol/L (ref 3.5–5.1)
Sodium: 140 mmol/L (ref 135–145)
Total Bilirubin: 0.3 mg/dL (ref 0.3–1.2)
Total Protein: 7.5 g/dL (ref 6.5–8.1)

## 2019-09-21 LAB — CBC WITH DIFFERENTIAL (CANCER CENTER ONLY)
Abs Immature Granulocytes: 0.05 10*3/uL (ref 0.00–0.07)
Basophils Absolute: 0.1 10*3/uL (ref 0.0–0.1)
Basophils Relative: 1 %
Eosinophils Absolute: 0.2 10*3/uL (ref 0.0–0.5)
Eosinophils Relative: 2 %
HCT: 45.4 % (ref 39.0–52.0)
Hemoglobin: 15.2 g/dL (ref 13.0–17.0)
Immature Granulocytes: 1 %
Lymphocytes Relative: 31 %
Lymphs Abs: 3 10*3/uL (ref 0.7–4.0)
MCH: 31.2 pg (ref 26.0–34.0)
MCHC: 33.5 g/dL (ref 30.0–36.0)
MCV: 93.2 fL (ref 80.0–100.0)
Monocytes Absolute: 0.8 10*3/uL (ref 0.1–1.0)
Monocytes Relative: 8 %
Neutro Abs: 5.5 10*3/uL (ref 1.7–7.7)
Neutrophils Relative %: 57 %
Platelet Count: 393 10*3/uL (ref 150–400)
RBC: 4.87 MIL/uL (ref 4.22–5.81)
RDW: 12.5 % (ref 11.5–15.5)
WBC Count: 9.6 10*3/uL (ref 4.0–10.5)
nRBC: 0 % (ref 0.0–0.2)

## 2019-09-21 LAB — LACTATE DEHYDROGENASE: LDH: 200 U/L — ABNORMAL HIGH (ref 98–192)

## 2019-09-21 MED ORDER — DOXORUBICIN HCL LIPOSOMAL CHEMO INJECTION 2 MG/ML
20.0000 mg/m2 | Freq: Once | INTRAVENOUS | Status: AC
  Administered 2019-09-21: 36 mg via INTRAVENOUS
  Filled 2019-09-21: qty 18

## 2019-09-21 MED ORDER — DEXTROSE 5 % IV SOLN
Freq: Once | INTRAVENOUS | Status: AC
  Filled 2019-09-21: qty 250

## 2019-09-21 MED ORDER — ONDANSETRON HCL 8 MG PO TABS
8.0000 mg | ORAL_TABLET | Freq: Once | ORAL | Status: AC
  Administered 2019-09-21: 8 mg via ORAL

## 2019-09-21 MED ORDER — ONDANSETRON HCL 8 MG PO TABS
ORAL_TABLET | ORAL | Status: AC
  Filled 2019-09-21: qty 1

## 2019-09-21 MED ORDER — SODIUM CHLORIDE 0.9 % IV SOLN
10.0000 mg | Freq: Once | INTRAVENOUS | Status: AC
  Administered 2019-09-21: 10 mg via INTRAVENOUS
  Filled 2019-09-21: qty 10

## 2019-09-21 MED ORDER — ONDANSETRON HCL 4 MG/2ML IJ SOLN
INTRAMUSCULAR | Status: AC
  Filled 2019-09-21: qty 4

## 2019-09-21 NOTE — Progress Notes (Signed)
Hematology and Oncology Follow Up Visit  Todd Vincent [id number] Sep 03, 1975 44 y.o. 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 He is doing well but has had some issues with vertigo and dizziness. He takes Antivert as needed.  He had a spot between his buttocks that has opened up and is draining. He has an appointment with his surgeon tomorrow for further eval and treatment.  He has had some chills and cold sweats off and on with this and also since getting his 2nd Covid vaccine Tuesday.  No fever, n/v, cough, rash, dizziness, SOB, chest pain, palpitations, abdominal pain or changes in bowel or bladder habits.  His ostomy is functioning properly. No episodes of bleeding. No bruising or petechiae.  He recently followed up with ID Dr. Linus Salmons and his repeat HIV blood work was back down.  The swelling in his lower extremities is a little more present today as he forgot his diuretic at home. Pedal pulses are 2+.  Neuropathy in his hands and feet is unchanged.  No falls or syncope.  He is eating well and staying properly hydrated. His weight is stable.   ECOG Performance Status: 1 - Symptomatic but completely ambulatory  Medications:  Allergies as of 09/21/2019      Reactions   Sulfa Antibiotics Rash, Itching   Extreme itching    Sulfasalazine Itching, Rash      Medication List       Accurate as of Sep 21, 2019 12:07 PM. If you have any questions, ask your nurse or doctor.        acyclovir 800 MG tablet Commonly known as: ZOVIRAX Take 800 mg by mouth 5 (five) times daily.   ALPRAZolam 0.5 MG tablet Commonly known as: XANAX TAKE 1 TABLET(0.5 MG) BY MOUTH AT BEDTIME AS NEEDED FOR ANXIETY     dronabinol 5 MG capsule Commonly known as: MARINOL Take one capsule by  Mouth twice daily before lunch and supper   Genvoya 150-150-200-10 MG Tabs tablet Generic drug: elvitegravir-cobicistat-emtricitabine-tenofovir TAKE 1 TABLET BY MOUTH DAILY   LORazepam 0.5 MG tablet Commonly known as: ATIVAN TAKE 1 BY MOUTH EVERY 6 HOURS AS NEEDED   meclizine 25 MG tablet Commonly known as: ANTIVERT Take 1 tablet (25 mg total) by mouth every 6 (six) hours as needed for dizziness.   ondansetron 8 MG tablet Commonly known as: ZOFRAN Take 1 tablet (8 mg total) by mouth 2 (two) times daily. Start the day after chemo for 2 days. Then take as needed for nausea or vomiting.   oxyCODONE 5 MG immediate release tablet Commonly known as: Oxy IR/ROXICODONE Take 5 mg by mouth every 6 (six) hours as needed.   pregabalin 75 MG capsule Commonly known as: LYRICA TAKE 1 CAPSULE BY MOUTH TWICE DAILY   Prezista 800 MG tablet Generic drug: darunavir TAKE 1 TABLET(800 MG) BY MOUTH DAILY WITH BREAKFAST   triamterene-hydrochlorothiazide 37.5-25 MG tablet Commonly known as: MAXZIDE-25 TAKE 1 TABLET BY MOUTH EVERY DAY   valACYclovir 1000 MG tablet Commonly known as: VALTREX TAKE 1 TABLET(1000 MG) BY MOUTH DAILY AS NEEDED       Allergies:  Allergies  Allergen Reactions  . Sulfa Antibiotics Rash and Itching    Extreme itching   . Sulfasalazine Itching and Rash  Past Medical History, Surgical history, Social history, and Family History were reviewed and updated.  Review of Systems: All other 10 point review of systems is negative.   Physical Exam:  height is 5\' 7"  (1.702 m) and weight is 149 lb 0.6 oz (67.6 kg). His temporal temperature is 97.7 F (36.5 C). His blood pressure is 135/86 and his pulse is 84. His oxygen saturation is 100%.   Wt Readings from Last 3 Encounters:  09/21/19 149 lb 0.6 oz (67.6 kg)  08/21/19 157 lb 0.6 oz (71.2 kg)  08/21/19 157 lb (71.2 kg)    Ocular: Sclerae  unicteric, pupils equal, round and reactive to light Ear-nose-throat: Oropharynx clear, dentition fair Lymphatic: No cervical or supraclavicular adenopathy Lungs no rales or rhonchi, good excursion bilaterally Heart regular rate and rhythm, no murmur appreciated Abd soft, nontender, positive bowel sounds, no liver or spleen tip palpated on exam, no fluid wave  MSK no focal spinal tenderness, no joint edema Neuro: non-focal, well-oriented, appropriate affect Breasts: Deferred   Lab Results  Component Value Date   WBC 9.6 09/21/2019   HGB 15.2 09/21/2019   HCT 45.4 09/21/2019   MCV 93.2 09/21/2019   PLT 393 09/21/2019   Lab Results  Component Value Date   FERRITIN 42 03/28/2019   IRON 81 03/28/2019   TIBC 301 03/28/2019   UIBC 220 03/28/2019   IRONPCTSAT 27 03/28/2019   Lab Results  Component Value Date   RBC 4.87 09/21/2019   No results found for: KPAFRELGTCHN, LAMBDASER, KAPLAMBRATIO No results found for: IGGSERUM, IGA, IGMSERUM No results found for: Odetta Pink, SPEI   Chemistry      Component Value Date/Time   NA 138 08/21/2019 1110   NA 141 04/19/2017 1007   NA 141 12/13/2015 0827   K 3.9 08/21/2019 1110   K 3.4 04/19/2017 1007   K 4.0 12/13/2015 0827   CL 106 08/21/2019 1110   CL 105 04/19/2017 1007   CO2 27 08/21/2019 1110   CO2 27 04/19/2017 1007   CO2 26 12/13/2015 0827   BUN 8 08/21/2019 1110   BUN 8 04/19/2017 1007   BUN 8.2 12/13/2015 0827   CREATININE 0.91 08/21/2019 1110   CREATININE 1.09 08/07/2019 0928   CREATININE 1.2 12/13/2015 0827      Component Value Date/Time   CALCIUM 9.3 08/21/2019 1110   CALCIUM 9.3 04/19/2017 1007   CALCIUM 9.2 12/13/2015 0827   ALKPHOS 78 08/21/2019 1110   ALKPHOS 82 04/19/2017 1007   ALKPHOS 78 12/13/2015 0827   AST 11 (L) 08/21/2019 1110   AST 14 12/13/2015 0827   ALT 11 08/21/2019 1110   ALT 23 04/19/2017 1007   ALT 13 12/13/2015 0827   BILITOT 0.3  08/21/2019 1110   BILITOT 0.40 12/13/2015 0827       Impression and Plan: Todd Vincent is a very pleasant 44 yo African American gentleman with Kaposi's sarcoma as well as history of rectal cancer. We will proceed with Doxil today as planned and see him again in one month.  He will contact our office with any questions or concerns. We can certainly see him sooner if needed.   Laverna Peace, NP 5/6/202112:07 PM

## 2019-09-21 NOTE — Patient Instructions (Signed)
Doxorubicin Liposomal injection What is this medicine? LIPOSOMAL DOXORUBICIN (LIP oh som al dox oh ROO bi sin) is a chemotherapy drug. This medicine is used to treat many kinds of cancer like Kaposi's sarcoma, multiple myeloma, and ovarian cancer. This medicine may be used for other purposes; ask your health care provider or pharmacist if you have questions. COMMON BRAND NAME(S): Doxil, Lipodox What should I tell my health care provider before I take this medicine? They need to know if you have any of these conditions:  blood disorders  heart disease  infection (especially a virus infection such as chickenpox, cold sores, or herpes)  liver disease  recent or ongoing radiation therapy  an unusual or allergic reaction to doxorubicin, other chemotherapy agents, soybeans, other medicines, foods, dyes, or preservatives  pregnant or trying to get pregnant  breast-feeding How should I use this medicine? This drug is given as an infusion into a vein. It is administered in a hospital or clinic by a specially trained health care professional. If you have pain, swelling, burning or any unusual feeling around the site of your injection, tell your health care professional right away. Talk to your pediatrician regarding the use of this medicine in children. Special care may be needed. Overdosage: If you think you have taken too much of this medicine contact a poison control center or emergency room at once. NOTE: This medicine is only for you. Do not share this medicine with others. What if I miss a dose? It is important not to miss your dose. Call your doctor or health care professional if you are unable to keep an appointment. What may interact with this medicine? Do not take this medicine with any of the following medications:  zidovudine This medicine may also interact with the following medications:  medicines to increase blood counts like filgrastim, pegfilgrastim,  sargramostim  vaccines Talk to your doctor or health care professional before taking any of these medicines:  acetaminophen  aspirin  ibuprofen  ketoprofen  naproxen This list may not describe all possible interactions. Give your health care provider a list of all the medicines, herbs, non-prescription drugs, or dietary supplements you use. Also tell them if you smoke, drink alcohol, or use illegal drugs. Some items may interact with your medicine. What should I watch for while using this medicine? Your condition will be monitored carefully while you are receiving this medicine. You may need blood work done while you are taking this medicine. This drug may make you feel generally unwell. This is not uncommon, as chemotherapy can affect healthy cells as well as cancer cells. Report any side effects. Continue your course of treatment even though you feel ill unless your doctor tells you to stop. Your urine may turn orange-red for a few days after your dose. This is not blood. If your urine is dark or brown, call your doctor. In some cases, you may be given additional medicines to help with side effects. Follow all directions for their use. Talk to your doctor about your risk of cancer. You may be more at risk for certain types of cancers if you take this medicine. Do not become pregnant while taking this medicine or for 6 months after stopping it. Women should inform their healthcare professional if they wish to become pregnant or think they may be pregnant. Men should not father a child while taking this medicine and for 6 months after stopping it. There is a potential for serious side effects to an unborn child.   Talk to your health care professional or pharmacist for more information. Do not breast-feed an infant while taking this medicine. This medicine has caused ovarian failure in some women. This medicine may make it more difficult to get pregnant. Talk to your healthcare professional if  you are concerned about your fertility. This medicine has caused decreased sperm counts in some men. This may make it more difficult to father a child. Talk to your healthcare professional if you are concerned about your fertility. This medicine may cause a decrease in Co-Enzyme Q-10. You should make sure that you get enough Co-Enzyme Q-10 while you are taking this medicine. Discuss the foods you eat and the vitamins you take with your health care professional. What side effects may I notice from receiving this medicine? Side effects that you should report to your doctor or health care professional as soon as possible:  allergic reactions like skin rash, itching or hives, swelling of the face, lips, or tongue  low blood counts - this medicine may decrease the number of white blood cells, red blood cells and platelets. You may be at increased risk for infections and bleeding.  signs of hand-foot syndrome - tingling or burning, redness, flaking, swelling, small blisters, or small sores on the palms of your hands or the soles of your feet  signs of infection - fever or chills, cough, sore throat, pain or difficulty passing urine  signs of decreased platelets or bleeding - bruising, pinpoint red spots on the skin, black, tarry stools, blood in the urine  signs of decreased red blood cells - unusually weak or tired, fainting spells, lightheadedness  back pain, chills, facial flushing, fever, headache, tightness in the chest or throat during the infusion  breathing problems  chest pain  fast, irregular heartbeat  mouth pain, redness, sores  pain, swelling, redness at site where injected  pain, tingling, numbness in the hands or feet  swelling of ankles, feet, or hands  vomiting Side effects that usually do not require medical attention (report to your doctor or health care professional if they continue or are bothersome):  diarrhea  hair loss  loss of appetite  nail discoloration  or damage  nausea  red or watery eyes  red colored urine  stomach upset This list may not describe all possible side effects. Call your doctor for medical advice about side effects. You may report side effects to FDA at 1-800-FDA-1088. Where should I keep my medicine? This drug is given in a hospital or clinic and will not be stored at home. NOTE: This sheet is a summary. It may not cover all possible information. If you have questions about this medicine, talk to your doctor, pharmacist, or health care provider.  2020 Elsevier/Gold Standard (2018-01-10 15:13:26)  

## 2019-10-13 NOTE — Progress Notes (Signed)
Pharmacist Chemotherapy Monitoring - Follow Up Assessment    I verify that I have reviewed each item in the below checklist:  . Regimen for the patient is scheduled for the appropriate day and plan matches scheduled date. Marland Kitchen Appropriate non-routine labs are ordered dependent on drug ordered. . If applicable, additional medications reviewed and ordered per protocol based on lifetime cumulative doses and/or treatment regimen.   Plan for follow-up and/or issues identified: No . I-vent associated with next due treatment: No . MD and/or nursing notified: No  Detrick Dani, [name] 10/13/2019 1:15 PM

## 2019-10-23 ENCOUNTER — Other Ambulatory Visit: Payer: Self-pay

## 2019-10-23 ENCOUNTER — Inpatient Hospital Stay: Payer: Medicare Other | Attending: Hematology & Oncology

## 2019-10-23 ENCOUNTER — Inpatient Hospital Stay (HOSPITAL_BASED_OUTPATIENT_CLINIC_OR_DEPARTMENT_OTHER): Payer: Medicare Other | Admitting: Family

## 2019-10-23 ENCOUNTER — Inpatient Hospital Stay: Payer: Medicare Other

## 2019-10-23 ENCOUNTER — Encounter: Payer: Self-pay | Admitting: Family

## 2019-10-23 VITALS — BP 114/75 | HR 71 | Temp 97.1°F | Resp 18 | Wt 150.0 lb

## 2019-10-23 DIAGNOSIS — B20 Human immunodeficiency virus [HIV] disease: Secondary | ICD-10-CM | POA: Diagnosis not present

## 2019-10-23 DIAGNOSIS — C20 Malignant neoplasm of rectum: Secondary | ICD-10-CM

## 2019-10-23 DIAGNOSIS — Z5111 Encounter for antineoplastic chemotherapy: Secondary | ICD-10-CM | POA: Insufficient documentation

## 2019-10-23 DIAGNOSIS — Z21 Asymptomatic human immunodeficiency virus [HIV] infection status: Secondary | ICD-10-CM

## 2019-10-23 DIAGNOSIS — C469 Kaposi's sarcoma, unspecified: Secondary | ICD-10-CM

## 2019-10-23 LAB — CBC WITH DIFFERENTIAL (CANCER CENTER ONLY)
Abs Immature Granulocytes: 0.04 10*3/uL (ref 0.00–0.07)
Basophils Absolute: 0 10*3/uL (ref 0.0–0.1)
Basophils Relative: 1 %
Eosinophils Absolute: 0.3 10*3/uL (ref 0.0–0.5)
Eosinophils Relative: 3 %
HCT: 40.3 % (ref 39.0–52.0)
Hemoglobin: 13.8 g/dL (ref 13.0–17.0)
Immature Granulocytes: 1 %
Lymphocytes Relative: 31 %
Lymphs Abs: 2.5 10*3/uL (ref 0.7–4.0)
MCH: 31.5 pg (ref 26.0–34.0)
MCHC: 34.2 g/dL (ref 30.0–36.0)
MCV: 92 fL (ref 80.0–100.0)
Monocytes Absolute: 0.6 10*3/uL (ref 0.1–1.0)
Monocytes Relative: 7 %
Neutro Abs: 4.5 10*3/uL (ref 1.7–7.7)
Neutrophils Relative %: 57 %
Platelet Count: 311 10*3/uL (ref 150–400)
RBC: 4.38 MIL/uL (ref 4.22–5.81)
RDW: 13.8 % (ref 11.5–15.5)
WBC Count: 7.9 10*3/uL (ref 4.0–10.5)
nRBC: 0 % (ref 0.0–0.2)

## 2019-10-23 LAB — LACTATE DEHYDROGENASE: LDH: 195 U/L — ABNORMAL HIGH (ref 98–192)

## 2019-10-23 LAB — CMP (CANCER CENTER ONLY)
ALT: 10 U/L (ref 0–44)
AST: 13 U/L — ABNORMAL LOW (ref 15–41)
Albumin: 3.9 g/dL (ref 3.5–5.0)
Alkaline Phosphatase: 70 U/L (ref 38–126)
Anion gap: 6 (ref 5–15)
BUN: 6 mg/dL (ref 6–20)
CO2: 28 mmol/L (ref 22–32)
Calcium: 9.3 mg/dL (ref 8.9–10.3)
Chloride: 103 mmol/L (ref 98–111)
Creatinine: 0.96 mg/dL (ref 0.61–1.24)
GFR, Est AFR Am: >60 mL/min (ref >60)
GFR, Estimated: >60 mL/min (ref >60)
Glucose, Bld: 120 mg/dL — ABNORMAL HIGH (ref 70–99)
Potassium: 3.8 mmol/L (ref 3.5–5.1)
Sodium: 137 mmol/L (ref 135–145)
Total Bilirubin: 0.3 mg/dL (ref 0.3–1.2)
Total Protein: 6.9 g/dL (ref 6.5–8.1)

## 2019-10-23 MED ORDER — SODIUM CHLORIDE 0.9 % IV SOLN
10.0000 mg | Freq: Once | INTRAVENOUS | Status: AC
  Administered 2019-10-23: 10 mg via INTRAVENOUS
  Filled 2019-10-23: qty 10

## 2019-10-23 MED ORDER — ONDANSETRON HCL 8 MG PO TABS
8.0000 mg | ORAL_TABLET | Freq: Once | ORAL | Status: AC
  Administered 2019-10-23: 8 mg via ORAL

## 2019-10-23 MED ORDER — DOXORUBICIN HCL LIPOSOMAL CHEMO INJECTION 2 MG/ML
20.0000 mg/m2 | Freq: Once | INTRAVENOUS | Status: AC
  Administered 2019-10-23: 36 mg via INTRAVENOUS
  Filled 2019-10-23: qty 18

## 2019-10-23 MED ORDER — DEXTROSE 5 % IV SOLN
Freq: Once | INTRAVENOUS | Status: DC
  Filled 2019-10-23: qty 250

## 2019-10-23 MED ORDER — ONDANSETRON HCL 8 MG PO TABS
ORAL_TABLET | ORAL | Status: AC
  Filled 2019-10-23: qty 1

## 2019-10-23 NOTE — Progress Notes (Addendum)
Hematology and Oncology Follow Up Visit  Todd Vincent [id number] 05/06/1976 44 y.o. 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 He is already on Lyrica and will try taking a B complex vitamin daily as well.  No fever, chills, n/v, cough, rash, SOB, chest pain, palpitations, abdominal pain or changes in bowel or bladder habits.  Ostomy is functioning appropriately.  No episodes of bleeding. No bruising or petechiae.  No lesions or hyperpigmentation noted on exam.  He has noted increased episodes of vertigo lately and has been taking Antivert as needed.  The swelling in his lower extremities has remained stable. Pedal pulses are 2+.  No falls or syncopal episodes.  He has maintained a good appetite and is staying well hydrated. His weight is stable.   ECOG Performance Status: 1 - Symptomatic but completely ambulatory  Medications:  Allergies as of 10/23/2019      Reactions   Sulfa Antibiotics Rash, Itching   Extreme itching    Sulfasalazine Itching, Rash      Medication List       Accurate as of October 23, 2019 11:47 AM. If you have any questions, ask your nurse or doctor.        acyclovir 800 MG tablet Commonly known as: ZOVIRAX Take 800 mg by mouth 5 (five) times daily.   ALPRAZolam 0.5 MG tablet Commonly known as: XANAX TAKE 1 TABLET(0.5 MG) BY MOUTH AT BEDTIME AS NEEDED FOR ANXIETY   dronabinol 5 MG capsule Commonly known as: MARINOL Take one capsule by  Mouth twice daily before lunch and supper   Genvoya 150-150-200-10 MG Tabs tablet Generic drug:  elvitegravir-cobicistat-emtricitabine-tenofovir TAKE 1 TABLET BY MOUTH DAILY   LORazepam 0.5 MG tablet Commonly known as: ATIVAN TAKE 1 BY MOUTH EVERY 6 HOURS AS NEEDED   meclizine 25 MG tablet Commonly known as: ANTIVERT Take 1 tablet (25 mg total) by mouth every 6 (six) hours as needed for dizziness.   ondansetron 8 MG tablet Commonly known as: ZOFRAN Take 1 tablet (8 mg total) by mouth 2 (two) times daily. Start the day after chemo for 2 days. Then take as needed for nausea or vomiting.   oxyCODONE 5 MG immediate release tablet Commonly known as: Oxy IR/ROXICODONE Take 5 mg by mouth every 6 (six) hours as needed.   pregabalin 75 MG capsule Commonly known as: LYRICA TAKE 1 CAPSULE BY MOUTH TWICE DAILY   Prezista 800 MG tablet Generic drug: darunavir TAKE 1 TABLET(800 MG) BY MOUTH DAILY WITH BREAKFAST   triamterene-hydrochlorothiazide 37.5-25 MG tablet Commonly known as: MAXZIDE-25 TAKE 1 TABLET BY MOUTH EVERY DAY   valACYclovir 1000 MG tablet Commonly known as: VALTREX TAKE 1 TABLET(1000 MG) BY MOUTH DAILY AS NEEDED       Allergies:  Allergies  Allergen Reactions  . Sulfa Antibiotics Rash and Itching    Extreme itching   . Sulfasalazine Itching and Rash    Past Medical History, Surgical history, Social history, and Family History were reviewed and updated.  Review of Systems: All other 10 point review of systems is negative.   Physical Exam:  weight is 150 lb (68 kg). His temporal temperature is 97.1  F (36.2 C) (abnormal). His blood pressure is 114/75 and his pulse is 71. His respiration is 18 and oxygen saturation is 100%.   Wt Readings from Last 3 Encounters:  10/23/19 150 lb (68 kg)  09/21/19 149 lb 0.6 oz (67.6 kg)  08/21/19 157 lb 0.6 oz (71.2 kg)    Ocular: Sclerae unicteric, pupils equal, round and reactive to light Ear-nose-throat: Oropharynx clear, dentition fair Lymphatic: No cervical, supraclavicular or axillary adenopathy Lungs no rales  or rhonchi, good excursion bilaterally Heart regular rate and rhythm, no murmur appreciated Abd soft, nontender, positive bowel sounds, no liver or spleen tip palpated on exam, no fluid wave  MSK no focal spinal tenderness, no joint edema Neuro: non-focal, well-oriented, appropriate affect Breasts: Deferred   Lab Results  Component Value Date   WBC 7.9 10/23/2019   HGB 13.8 10/23/2019   HCT 40.3 10/23/2019   MCV 92.0 10/23/2019   PLT 311 10/23/2019   Lab Results  Component Value Date   FERRITIN 42 03/28/2019   IRON 81 03/28/2019   TIBC 301 03/28/2019   UIBC 220 03/28/2019   IRONPCTSAT 27 03/28/2019   Lab Results  Component Value Date   RBC 4.38 10/23/2019   No results found for: KPAFRELGTCHN, LAMBDASER, KAPLAMBRATIO No results found for: IGGSERUM, IGA, IGMSERUM No results found for: Odetta Pink, SPEI   Chemistry      Component Value Date/Time   NA 137 10/23/2019 1108   NA 141 04/19/2017 1007   NA 141 12/13/2015 0827   K 3.8 10/23/2019 1108   K 3.4 04/19/2017 1007   K 4.0 12/13/2015 0827   CL 103 10/23/2019 1108   CL 105 04/19/2017 1007   CO2 28 10/23/2019 1108   CO2 27 04/19/2017 1007   CO2 26 12/13/2015 0827   BUN 6 10/23/2019 1108   BUN 8 04/19/2017 1007   BUN 8.2 12/13/2015 0827   CREATININE 0.96 10/23/2019 1108   CREATININE 1.09 08/07/2019 0928   CREATININE 1.2 12/13/2015 0827      Component Value Date/Time   CALCIUM 9.3 10/23/2019 1108   CALCIUM 9.3 04/19/2017 1007   CALCIUM 9.2 12/13/2015 0827   ALKPHOS 70 10/23/2019 1108   ALKPHOS 82 04/19/2017 1007   ALKPHOS 78 12/13/2015 0827   AST 13 (L) 10/23/2019 1108   AST 14 12/13/2015 0827   ALT 10 10/23/2019 1108   ALT 23 04/19/2017 1007   ALT 13 12/13/2015 0827   BILITOT 0.3 10/23/2019 1108   BILITOT 0.40 12/13/2015 0827       Impression and Plan: Todd Vincent is a very pleasant 44 yo African American gentleman with Kaposi's sarcoma as well as  history of rectal cancer. We will proceed with Doxil today as plannedand see him again in one month.  He will contact our office with any questions or concerns. We can certainly see him sooner if needed.  Laverna Peace, NP 6/7/202111:47 AM

## 2019-10-23 NOTE — Patient Instructions (Signed)
Doxorubicin Liposomal injection  What is this medicine?  LIPOSOMAL DOXORUBICIN (LIP oh som al dox oh ROO bi sin) is a chemotherapy drug. This medicine is used to treat many kinds of cancer like Kaposi's sarcoma, multiple myeloma, and ovarian cancer.  This medicine may be used for other purposes; ask your health care provider or pharmacist if you have questions.  COMMON BRAND NAME(S): Doxil, Lipodox  What should I tell my health care provider before I take this medicine?  They need to know if you have any of these conditions:  -blood disorders  -heart disease  -infection (especially a virus infection such as chickenpox, cold sores, or herpes)  -liver disease  -recent or ongoing radiation therapy  -an unusual or allergic reaction to doxorubicin, other chemotherapy agents, soybeans, other medicines, foods, dyes, or preservatives  -pregnant or trying to get pregnant  -breast-feeding  How should I use this medicine?  This drug is given as an infusion into a vein. It is administered in a hospital or clinic by a specially trained health care professional. If you have pain, swelling, burning or any unusual feeling around the site of your injection, tell your health care professional right away.  Talk to your pediatrician regarding the use of this medicine in children. Special care may be needed.  Overdosage: If you think you have taken too much of this medicine contact a poison control center or emergency room at once.  NOTE: This medicine is only for you. Do not share this medicine with others.  What if I miss a dose?  It is important not to miss your dose. Call your doctor or health care professional if you are unable to keep an appointment.  What may interact with this medicine?  Do not take this medicine with any of the following medications:  -zidovudine  This medicine may also interact with the following medications:  -medicines to increase blood counts like filgrastim, pegfilgrastim, sargramostim  -vaccines  Talk to  your doctor or health care professional before taking any of these medicines:  -acetaminophen  -aspirin  -ibuprofen  -ketoprofen  -naproxen  This list may not describe all possible interactions. Give your health care provider a list of all the medicines, herbs, non-prescription drugs, or dietary supplements you use. Also tell them if you smoke, drink alcohol, or use illegal drugs. Some items may interact with your medicine.  What should I watch for while using this medicine?  Your condition will be monitored carefully while you are receiving this medicine. You may need blood work done while you are taking this medicine.  This drug may make you feel generally unwell. This is not uncommon, as chemotherapy can affect healthy cells as well as cancer cells. Report any side effects. Continue your course of treatment even though you feel ill unless your doctor tells you to stop.  Your urine may turn orange-red for a few days after your dose. This is not blood. If your urine is dark or brown, call your doctor.  In some cases, you may be given additional medicines to help with side effects. Follow all directions for their use.  Talk to your doctor about your risk of cancer. You may be more at risk for certain types of cancers if you take this medicine.  Do not become pregnant while taking this medicine or for 6 months after stopping it. Women should inform their healthcare professional if they wish to become pregnant or think they may be pregnant. Men should not father   while taking this medicine. This medicine has caused ovarian failure in some women. This medicine may make it more difficult to get pregnant. Talk to your healthcare professional if you are concerned about your  fertility. This medicine has caused decreased sperm counts in some men. This may make it more difficult to father a child. Talk to your healthcare professional if you are concerned about your fertility. This medicine may cause a decrease in Co-Enzyme Q-10. You should make sure that you get enough Co-Enzyme Q-10 while you are taking this medicine. Discuss the foods you eat and the vitamins you take with your health care professional. What side effects may I notice from receiving this medicine? Side effects that you should report to your doctor or health care professional as soon as possible: -allergic reactions like skin rash, itching or hives, swelling of the face, lips, or tongue -low blood counts - this medicine may decrease the number of white blood cells, red blood cells and platelets. You may be at increased risk for infections and bleeding. -signs of hand-foot syndrome - tingling or burning, redness, flaking, swelling, small blisters, or small sores on the palms of your hands or the soles of your feet -signs of infection - fever or chills, cough, sore throat, pain or difficulty passing urine -signs of decreased platelets or bleeding - bruising, pinpoint red spots on the skin, black, tarry stools, blood in the urine -signs of decreased red blood cells - unusually weak or tired, fainting spells, lightheadedness -back pain, chills, facial flushing, fever, headache, tightness in the chest or throat during the infusion -breathing problems -chest pain -fast, irregular heartbeat -mouth pain, redness, sores -pain, swelling, redness at site where injected -pain, tingling, numbness in the hands or feet -swelling of ankles, feet, or hands -vomiting Side effects that usually do not require medical attention (report to your doctor or health care professional if they continue or are bothersome): -diarrhea -hair loss -loss of appetite -nail discoloration or damage -nausea -red or watery eyes -red  colored urine -stomach upset This list may not describe all possible side effects. Call your doctor for medical advice about side effects. You may report side effects to FDA at 1-800-FDA-1088. Where should I keep my medicine? This drug is given in a hospital or clinic and will not be stored at home. NOTE: This sheet is a summary. It may not cover all possible information. If you have questions about this medicine, talk to your doctor, pharmacist, or health care provider.  2019 [employer] Standard (2018-01-10 15:13:26) Ondansetron tablets What is this medicine? ONDANSETRON (on DAN se tron) is used to treat nausea and vomiting caused by chemotherapy. It is also used to prevent or treat nausea and vomiting after surgery. This medicine may be used for other purposes; ask your health care provider or pharmacist if you have questions. COMMON BRAND NAME(S): Zofran What should I tell my health care provider before I take this medicine? They need to know if you have any of these conditions: -heart disease -history of irregular heartbeat -liver disease -low levels of magnesium or potassium in the blood -an unusual or allergic reaction to ondansetron, granisetron, other medicines, foods, dyes, or preservatives -pregnant or trying to get pregnant -breast-feeding How should I use this medicine? Take this medicine by mouth with a glass of water. Follow the directions on your prescription label. Take your doses at regular intervals. Do not take your medicine more often than directed. Talk to your pediatrician regarding the use of this  medicine in children. Special care may be needed. Overdosage: If you think you have taken too much of this medicine contact a poison control center or emergency room at once. NOTE: This medicine is only for you. Do not share this medicine with others. What if I miss a dose? If you miss a dose, take it as soon as you can. If it is almost time for your next dose, take  only that dose. Do not take double or extra doses. What may interact with this medicine? Do not take this medicine with any of the following medications: -apomorphine -certain medicines for fungal infections like fluconazole, itraconazole, ketoconazole, posaconazole, voriconazole -cisapride -dofetilide -dronedarone -pimozide -thioridazine -ziprasidone This medicine may also interact with the following medications: -carbamazepine -certain medicines for depression, anxiety, or psychotic disturbances -fentanyl -linezolid -MAOIs like Carbex, Eldepryl, Marplan, Nardil, and Parnate -methylene blue (injected into a vein) -other medicines that prolong the QT interval (cause an abnormal heart rhythm) -phenytoin -rifampicin -tramadol This list may not describe all possible interactions. Give your health care provider a list of all the medicines, herbs, non-prescription drugs, or dietary supplements you use. Also tell them if you smoke, drink alcohol, or use illegal drugs. Some items may interact with your medicine. What should I watch for while using this medicine? Check with your doctor or health care professional right away if you have any sign of an allergic reaction. What side effects may I notice from receiving this medicine? Side effects that you should report to your doctor or health care professional as soon as possible: -allergic reactions like skin rash, itching or hives, swelling of the face, lips or tongue -breathing problems -confusion -dizziness -fast or irregular heartbeat -feeling faint or lightheaded, falls -fever and chills -loss of balance or coordination -seizures -sweating -swelling of the hands or feet -tightness in the chest -tremors -unusually weak or tired Side effects that usually do not require medical attention (report to your doctor or health care professional if they continue or are bothersome): -constipation or diarrhea -headache This list may not  describe all possible side effects. Call your doctor for medical advice about side effects. You may report side effects to FDA at 1-800-FDA-1088. Where should I keep my medicine? Keep out of the reach of children. Store between 2 and 30 degrees C (36 and 86 degrees F). Throw away any unused medicine after the expiration date. NOTE: This sheet is a summary. It may not cover all possible information. If you have questions about this medicine, talk to your doctor, pharmacist, or health care provider.  2019 [employer] Standard (2013-02-08 16:27:45) Dexamethasone injection What is this medicine? DEXAMETHASONE (dex a METH a sone) is a corticosteroid. It is used to treat inflammation of the skin, joints, lungs, and other organs. Common conditions treated include asthma, allergies, and arthritis. It is also used for other conditions, like blood disorders and diseases of the adrenal glands. This medicine may be used for other purposes; ask your health care provider or pharmacist if you have questions. COMMON BRAND NAME(S): Decadron, DoubleDex, Simplist Dexamethasone, Solurex What should I tell my health care provider before I take this medicine? They need to know if you have any of these conditions: -blood clotting problems -Cushing's syndrome -diabetes -glaucoma -heart problems or disease -high blood pressure -infection like herpes, measles, tuberculosis, or chickenpox -kidney disease -liver disease -mental problems -myasthenia gravis -osteoporosis -previous heart attack -seizures -stomach, ulcer or intestine disease including colitis and diverticulitis -thyroid problem -an unusual or allergic reaction to  dexamethasone, corticosteroids, other medicines, lactose, foods, dyes, or preservatives -pregnant or trying to get pregnant -breast-feeding How should I use this medicine? This medicine is for injection into a muscle, joint, lesion, soft tissue, or vein. It is given by a health care  professional in a hospital or clinic setting. Talk to your pediatrician regarding the use of this medicine in children. Special care may be needed. Overdosage: If you think you have taken too much of this medicine contact a poison control center or emergency room at once. NOTE: This medicine is only for you. Do not share this medicine with others. What if I miss a dose? This may not apply. If you are having a series of injections over a prolonged period, try not to miss an appointment. Call your doctor or health care professional to reschedule if you are unable to keep an appointment. What may interact with this medicine? Do not take this medicine with any of the following medications: -mifepristone, RU-486 -vaccines This medicine may also interact with the following medications: -amphotericin B -antibiotics like clarithromycin, erythromycin, and troleandomycin -aspirin and aspirin-like drugs -barbiturates like phenobarbital -carbamazepine -cholestyramine -cholinesterase inhibitors like donepezil, galantamine, rivastigmine, and tacrine -cyclosporine -digoxin -diuretics -ephedrine -male hormones, like estrogens or progestins and birth control pills -indinavir -isoniazid -ketoconazole -medicines for diabetes -medicines that improve muscle tone or strength for conditions like myasthenia gravis -NSAIDs, medicines for pain and inflammation, like ibuprofen or naproxen -phenytoin -rifampin -thalidomide -warfarin This list may not describe all possible interactions. Give your health care provider a list of all the medicines, herbs, non-prescription drugs, or dietary supplements you use. Also tell them if you smoke, drink alcohol, or use illegal drugs. Some items may interact with your medicine. What should I watch for while using this medicine? Your condition will be monitored carefully while you are receiving this medicine. If you are taking this medicine for a long time, carry an  identification card with your name and address, the type and dose of your medicine, and your doctor's name and address. This medicine may increase your risk of getting an infection. Stay away from people who are sick. Tell your doctor or health care professional if you are around anyone with measles or chickenpox. Talk to your health care provider before you get any vaccines that you take this medicine. If you are going to have surgery, tell your doctor or health care professional that you have taken this medicine within the last twelve months. Ask your doctor or health care professional about your diet. You may need to lower the amount of salt you eat. The medicine can increase your blood sugar. If you are a diabetic check with your doctor if you need help adjusting the dose of your diabetic medicine. What side effects may I notice from receiving this medicine? Side effects that you should report to your doctor or health care professional as soon as possible: -allergic reactions like skin rash, itching or hives, swelling of the face, lips, or tongue -black or tarry stools -change in the amount of urine -changes in vision -confusion, excitement, restlessness, a false sense of well-being -fever, sore throat, sneezing, cough, or other signs of infection, wounds that will not heal -hallucinations -increased thirst -mental depression, mood swings, mistaken feelings of self importance or of being mistreated -pain in hips, back, ribs, arms, shoulders, or legs -pain, redness, or irritation at the injection site -redness, blistering, peeling or loosening of the skin, including inside the mouth -rounding out of face -swelling of  feet or lower legs -unusual bleeding or bruising -unusual tired or weak -wounds that do not heal Side effects that usually do not require medical attention (report to your doctor or health care professional if they continue or are bothersome): -diarrhea or  constipation -change in taste -headache -nausea, vomiting -skin problems, acne, thin and shiny skin -touble sleeping -unusual growth of hair on the face or body -weight gain This list may not describe all possible side effects. Call your doctor for medical advice about side effects. You may report side effects to FDA at 1-800-FDA-1088. Where should I keep my medicine? This drug is given in a hospital or clinic and will not be stored at home. NOTE: This sheet is a summary. It may not cover all possible information. If you have questions about this medicine, talk to your doctor, pharmacist, or health care provider.  2019 [employer] Standard (2007-08-25 14:04:12) Doxorubicin Liposomal injection What is this medicine? LIPOSOMAL DOXORUBICIN (LIP oh som al dox oh ROO bi sin) is a chemotherapy drug. This medicine is used to treat many kinds of cancer like Kaposi's sarcoma, multiple myeloma, and ovarian cancer. This medicine may be used for other purposes; ask your health care provider or pharmacist if you have questions. COMMON BRAND NAME(S): Doxil, Lipodox What should I tell my health care provider before I take this medicine? They need to know if you have any of these conditions: -blood disorders -heart disease -infection (especially a virus infection such as chickenpox, cold sores, or herpes) -liver disease -recent or ongoing radiation therapy -an unusual or allergic reaction to doxorubicin, other chemotherapy agents, soybeans, other medicines, foods, dyes, or preservatives -pregnant or trying to get pregnant -breast-feeding How should I use this medicine? This drug is given as an infusion into a vein. It is administered in a hospital or clinic by a specially trained health care professional. If you have pain, swelling, burning or any unusual feeling around the site of your injection, tell your health care professional right away. Talk to your pediatrician regarding the use of this  medicine in children. Special care may be needed. Overdosage: If you think you have taken too much of this medicine contact a poison control center or emergency room at once. NOTE: This medicine is only for you. Do not share this medicine with others. What if I miss a dose? It is important not to miss your dose. Call your doctor or health care professional if you are unable to keep an appointment. What may interact with this medicine? Do not take this medicine with any of the following medications: -zidovudine This medicine may also interact with the following medications: -medicines to increase blood counts like filgrastim, pegfilgrastim, sargramostim -vaccines Talk to your doctor or health care professional before taking any of these medicines: -acetaminophen -aspirin -ibuprofen -ketoprofen -naproxen This list may not describe all possible interactions. Give your health care provider a list of all the medicines, herbs, non-prescription drugs, or dietary supplements you use. Also tell them if you smoke, drink alcohol, or use illegal drugs. Some items may interact with your medicine. What should I watch for while using this medicine? Your condition will be monitored carefully while you are receiving this medicine. You may need blood work done while you are taking this medicine. This drug may make you feel generally unwell. This is not uncommon, as chemotherapy can affect healthy cells as well as cancer cells. Report any side effects. Continue your course of treatment even though you feel ill unless your  doctor tells you to stop. Your urine may turn orange-red for a few days after your dose. This is not blood. If your urine is dark or brown, call your doctor. In some cases, you may be given additional medicines to help with side effects. Follow all directions for their use. Talk to your doctor about your risk of cancer. You may be more at risk for certain types of cancers if you take this  medicine. Do not become pregnant while taking this medicine or for 6 months after stopping it. Women should inform their healthcare professional if they wish to become pregnant or think they may be pregnant. Men should not father a child while taking this medicine and for 6 months after stopping it. There is a potential for serious side effects to an unborn child. Talk to your health care professional or pharmacist for more information. Do not breast-feed an infant while taking this medicine. This medicine has caused ovarian failure in some women. This medicine may make it more difficult to get pregnant. Talk to your healthcare professional if you are concerned about your fertility. This medicine has caused decreased sperm counts in some men. This may make it more difficult to father a child. Talk to your healthcare professional if you are concerned about your fertility. This medicine may cause a decrease in Co-Enzyme Q-10. You should make sure that you get enough Co-Enzyme Q-10 while you are taking this medicine. Discuss the foods you eat and the vitamins you take with your health care professional. What side effects may I notice from receiving this medicine? Side effects that you should report to your doctor or health care professional as soon as possible: -allergic reactions like skin rash, itching or hives, swelling of the face, lips, or tongue -low blood counts - this medicine may decrease the number of white blood cells, red blood cells and platelets. You may be at increased risk for infections and bleeding. -signs of hand-foot syndrome - tingling or burning, redness, flaking, swelling, small blisters, or small sores on the palms of your hands or the soles of your feet -signs of infection - fever or chills, cough, sore throat, pain or difficulty passing urine -signs of decreased platelets or bleeding - bruising, pinpoint red spots on the skin, black, tarry stools, blood in the urine -signs of  decreased red blood cells - unusually weak or tired, fainting spells, lightheadedness -back pain, chills, facial flushing, fever, headache, tightness in the chest or throat during the infusion -breathing problems -chest pain -fast, irregular heartbeat -mouth pain, redness, sores -pain, swelling, redness at site where injected -pain, tingling, numbness in the hands or feet -swelling of ankles, feet, or hands -vomiting Side effects that usually do not require medical attention (report to your doctor or health care professional if they continue or are bothersome): -diarrhea -hair loss -loss of appetite -nail discoloration or damage -nausea -red or watery eyes -red colored urine -stomach upset This list may not describe all possible side effects. Call your doctor for medical advice about side effects. You may report side effects to FDA at 1-800-FDA-1088. Where should I keep my medicine? This drug is given in a hospital or clinic and will not be stored at home. NOTE: This sheet is a summary. It may not cover all possible information. If you have questions about this medicine, talk to your doctor, pharmacist, or health care provider.  2019 [employer] Standard (2018-01-10 15:13:26)

## 2019-10-30 ENCOUNTER — Other Ambulatory Visit: Payer: Self-pay | Admitting: Hematology & Oncology

## 2019-10-30 DIAGNOSIS — B029 Zoster without complications: Secondary | ICD-10-CM

## 2019-10-30 DIAGNOSIS — C469 Kaposi's sarcoma, unspecified: Secondary | ICD-10-CM

## 2019-10-30 MED ORDER — ALPRAZOLAM 0.5 MG PO TABS: 0.5000 mg | ORAL_TABLET | Freq: Every evening | ORAL | 0 refills | Status: DC | PRN

## 2019-10-30 MED ORDER — VALACYCLOVIR HCL 1 G PO TABS: 1000.0000 mg | ORAL_TABLET | Freq: Every day | ORAL | 0 refills | Status: DC

## 2019-10-30 MED ORDER — DRONABINOL 5 MG PO CAPS: ORAL_CAPSULE | ORAL | 0 refills | Status: DC

## 2019-10-30 MED ORDER — LORAZEPAM 0.5 MG PO TABS: 0.5000 mg | ORAL_TABLET | Freq: Four times a day (QID) | ORAL | 2 refills | Status: AC | PRN

## 2019-11-22 ENCOUNTER — Inpatient Hospital Stay (HOSPITAL_BASED_OUTPATIENT_CLINIC_OR_DEPARTMENT_OTHER): Payer: Medicare Other | Admitting: Family

## 2019-11-22 ENCOUNTER — Encounter: Payer: Self-pay | Admitting: Family

## 2019-11-22 ENCOUNTER — Inpatient Hospital Stay: Payer: Medicare Other

## 2019-11-22 ENCOUNTER — Other Ambulatory Visit: Payer: Self-pay

## 2019-11-22 ENCOUNTER — Inpatient Hospital Stay: Payer: Medicare Other | Attending: Hematology & Oncology

## 2019-11-22 VITALS — BP 116/70 | HR 83 | Temp 98.3°F | Resp 16 | Ht 67.0 in | Wt 146.1 lb

## 2019-11-22 DIAGNOSIS — C469 Kaposi's sarcoma, unspecified: Secondary | ICD-10-CM

## 2019-11-22 DIAGNOSIS — B20 Human immunodeficiency virus [HIV] disease: Secondary | ICD-10-CM | POA: Diagnosis not present

## 2019-11-22 DIAGNOSIS — Z5111 Encounter for antineoplastic chemotherapy: Secondary | ICD-10-CM | POA: Diagnosis present

## 2019-11-22 DIAGNOSIS — Z85048 Personal history of other malignant neoplasm of rectum, rectosigmoid junction, and anus: Secondary | ICD-10-CM | POA: Insufficient documentation

## 2019-11-22 DIAGNOSIS — C20 Malignant neoplasm of rectum: Secondary | ICD-10-CM

## 2019-11-22 LAB — CBC WITH DIFFERENTIAL (CANCER CENTER ONLY)
Abs Immature Granulocytes: 0.02 10*3/uL (ref 0.00–0.07)
Basophils Absolute: 0 10*3/uL (ref 0.0–0.1)
Basophils Relative: 1 %
Eosinophils Absolute: 0.4 10*3/uL (ref 0.0–0.5)
Eosinophils Relative: 5 %
HCT: 40.4 % (ref 39.0–52.0)
Hemoglobin: 13.8 g/dL (ref 13.0–17.0)
Immature Granulocytes: 0 %
Lymphocytes Relative: 34 %
Lymphs Abs: 2.4 10*3/uL (ref 0.7–4.0)
MCH: 31.4 pg (ref 26.0–34.0)
MCHC: 34.2 g/dL (ref 30.0–36.0)
MCV: 92 fL (ref 80.0–100.0)
Monocytes Absolute: 0.7 10*3/uL (ref 0.1–1.0)
Monocytes Relative: 10 %
Neutro Abs: 3.6 10*3/uL (ref 1.7–7.7)
Neutrophils Relative %: 50 %
Platelet Count: 312 10*3/uL (ref 150–400)
RBC: 4.39 MIL/uL (ref 4.22–5.81)
RDW: 13.6 % (ref 11.5–15.5)
WBC Count: 7.1 10*3/uL (ref 4.0–10.5)
nRBC: 0 % (ref 0.0–0.2)

## 2019-11-22 LAB — CMP (CANCER CENTER ONLY)
ALT: 9 U/L (ref 0–44)
AST: 12 U/L — ABNORMAL LOW (ref 15–41)
Albumin: 3.9 g/dL (ref 3.5–5.0)
Alkaline Phosphatase: 71 U/L (ref 38–126)
Anion gap: 6 (ref 5–15)
BUN: 9 mg/dL (ref 6–20)
CO2: 27 mmol/L (ref 22–32)
Calcium: 9.3 mg/dL (ref 8.9–10.3)
Chloride: 106 mmol/L (ref 98–111)
Creatinine: 0.88 mg/dL (ref 0.61–1.24)
GFR, Est AFR Am: >60 mL/min (ref >60)
GFR, Estimated: >60 mL/min (ref >60)
Glucose, Bld: 108 mg/dL — ABNORMAL HIGH (ref 70–99)
Potassium: 4 mmol/L (ref 3.5–5.1)
Sodium: 139 mmol/L (ref 135–145)
Total Bilirubin: 0.3 mg/dL (ref 0.3–1.2)
Total Protein: 6.6 g/dL (ref 6.5–8.1)

## 2019-11-22 LAB — LACTATE DEHYDROGENASE: LDH: 184 U/L (ref 98–192)

## 2019-11-22 MED ORDER — ONDANSETRON HCL 8 MG PO TABS
ORAL_TABLET | ORAL | Status: AC
  Filled 2019-11-22: qty 1

## 2019-11-22 MED ORDER — SODIUM CHLORIDE 0.9 % IV SOLN
10.0000 mg | Freq: Once | INTRAVENOUS | Status: AC
  Administered 2019-11-22: 10 mg via INTRAVENOUS
  Filled 2019-11-22: qty 10

## 2019-11-22 MED ORDER — ONDANSETRON HCL 8 MG PO TABS
8.0000 mg | ORAL_TABLET | Freq: Once | ORAL | Status: AC
  Administered 2019-11-22: 8 mg via ORAL

## 2019-11-22 MED ORDER — DEXTROSE 5 % IV SOLN
Freq: Once | INTRAVENOUS | Status: AC
  Filled 2019-11-22: qty 250

## 2019-11-22 MED ORDER — DOXORUBICIN HCL LIPOSOMAL CHEMO INJECTION 2 MG/ML
20.0000 mg/m2 | Freq: Once | INTRAVENOUS | Status: AC
  Administered 2019-11-22: 36 mg via INTRAVENOUS
  Filled 2019-11-22: qty 18

## 2019-11-22 NOTE — Progress Notes (Signed)
Hematology and Oncology Follow Up Visit  Todd Vincent [id number] 1976/04/24 44 y.o. 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 He is doing well but states that he does not have much of an appetite. He is drinking 3-4 Boost a day. He feels that he is hydrating well. His weight is down 4 lbs since last month.  He is currently on Marinol 5 mg PO BID and will try to increase his protein intake. We will continue to monitor his weight closely.  He has not noted any skin lesions. No new hyperpigmented areas on exam.  No fever, chills, n/v, cough, rash, SOB, chest pain, palpitations, abdominal pain or changes in bowel or bladder habits.  Ostomy is functioning well. No issues.  He has occasional dizziness due to vertigo.  The swelling in his lower extremities seems better. Pedal pulses are 2+. No erythema or pitting edema noted on exam. He is taking his Maxzide.  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  No falls or syncopal episodes to report.  No episodes of bleeding, bruising or petechiae.   ECOG Performance Status: 1 - Symptomatic but completely ambulatory  Medications:  Allergies as of 11/22/2019      Reactions   Sulfa Antibiotics Rash, Itching   Extreme itching    Sulfasalazine Itching, Rash      Medication List       Accurate as of November 22, 2019  9:08 AM. If you have any questions, ask your nurse or doctor.        STOP taking these medications   oxyCODONE 5 MG immediate release tablet Commonly known as: Oxy IR/ROXICODONE Stopped by: Laverna Peace, NP     TAKE these medications   acyclovir 800 MG tablet Commonly known as: ZOVIRAX Take 800 mg by mouth 5 (five) times  daily.   ALPRAZolam 0.5 MG tablet Commonly known as: XANAX Take 1 tablet (0.5 mg total) by mouth at bedtime as needed for anxiety.   dronabinol 5 MG capsule Commonly known as: MARINOL Take one capsule by  Mouth twice daily before lunch and supper   Genvoya 150-150-200-10 MG Tabs tablet Generic drug: elvitegravir-cobicistat-emtricitabine-tenofovir TAKE 1 TABLET BY MOUTH DAILY   LORazepam 0.5 MG tablet Commonly known as: ATIVAN Take 1 tablet (0.5 mg total) by mouth every 6 (six) hours as needed for anxiety.   meclizine 25 MG tablet Commonly known as: ANTIVERT Take 1 tablet (25 mg total) by mouth every 6 (six) hours as needed for dizziness.   ondansetron 8 MG tablet Commonly known as: ZOFRAN Take 1 tablet (8 mg total) by mouth 2 (two) times daily. Start the day after chemo for 2 days. Then take as needed for nausea or vomiting.   pregabalin 75 MG capsule Commonly known as: LYRICA TAKE 1 CAPSULE BY MOUTH TWICE DAILY   Prezista 800 MG tablet Generic drug: darunavir TAKE 1 TABLET(800 MG) BY MOUTH DAILY WITH BREAKFAST   triamterene-hydrochlorothiazide 37.5-25 MG tablet Commonly known as: MAXZIDE-25 TAKE 1 TABLET BY MOUTH EVERY DAY   valACYclovir 1000 MG tablet Commonly known as: VALTREX Take 1 tablet (1,000 mg total) by mouth daily.       Allergies:  Allergies  Allergen Reactions  . Sulfa Antibiotics Rash and Itching    Extreme  itching   . Sulfasalazine Itching and Rash    Past Medical History, Surgical history, Social history, and Family History were reviewed and updated.  Review of Systems: All other 10 point review of systems is negative.   Physical Exam:  height is 5\' 7"  (1.702 m) and weight is 146 lb 1.6 oz (66.3 kg). His oral temperature is 98.3 F (36.8 C). His blood pressure is 116/70 and his pulse is 83. His respiration is 16 and oxygen saturation is 98%.   Wt Readings from Last 3 Encounters:  11/22/19 146 lb 1.6 oz (66.3 kg)  10/23/19 150 lb (68 kg)    09/21/19 149 lb 0.6 oz (67.6 kg)    Ocular: Sclerae unicteric, pupils equal, round and reactive to light Ear-nose-throat: Oropharynx clear, dentition fair Lymphatic: No cervical or supraclavicular adenopathy Lungs no rales or rhonchi, good excursion bilaterally Heart regular rate and rhythm, no murmur appreciated Abd soft, nontender, positive bowel sounds, no liver or spleen tip palpated on exam, no fluid wave  MSK no focal spinal tenderness, no joint edema Neuro: non-focal, well-oriented, appropriate affect Breasts: Deferred   Lab Results  Component Value Date   WBC 7.1 11/22/2019   HGB 13.8 11/22/2019   HCT 40.4 11/22/2019   MCV 92.0 11/22/2019   PLT 312 11/22/2019   Lab Results  Component Value Date   FERRITIN 42 03/28/2019   IRON 81 03/28/2019   TIBC 301 03/28/2019   UIBC 220 03/28/2019   IRONPCTSAT 27 03/28/2019   Lab Results  Component Value Date   RBC 4.39 11/22/2019   No results found for: KPAFRELGTCHN, LAMBDASER, KAPLAMBRATIO No results found for: IGGSERUM, IGA, IGMSERUM No results found for: Kathrynn Ducking, MSPIKE, SPEI   Chemistry      Component Value Date/Time   NA 139 11/22/2019 0804   NA 141 04/19/2017 1007   NA 141 12/13/2015 0827   K 4.0 11/22/2019 0804   K 3.4 04/19/2017 1007   K 4.0 12/13/2015 0827   CL 106 11/22/2019 0804   CL 105 04/19/2017 1007   CO2 27 11/22/2019 0804   CO2 27 04/19/2017 1007   CO2 26 12/13/2015 0827   BUN 9 11/22/2019 0804   BUN 8 04/19/2017 1007   BUN 8.2 12/13/2015 0827   CREATININE 0.88 11/22/2019 0804   CREATININE 1.09 08/07/2019 0928   CREATININE 1.2 12/13/2015 0827      Component Value Date/Time   CALCIUM 9.3 11/22/2019 0804   CALCIUM 9.3 04/19/2017 1007   CALCIUM 9.2 12/13/2015 0827   ALKPHOS 71 11/22/2019 0804   ALKPHOS 82 04/19/2017 1007   ALKPHOS 78 12/13/2015 0827   AST 12 (L) 11/22/2019 0804   AST 14 12/13/2015 0827   ALT 9 11/22/2019 0804   ALT 23  04/19/2017 1007   ALT 13 12/13/2015 0827   BILITOT 0.3 11/22/2019 0804   BILITOT 0.40 12/13/2015 0827       Impression and Plan: Todd Vincent is a very pleasant 44 yo African American gentleman with Kaposi's sarcoma as well as history of rectal cancer. I gave him some Boost Soothe to try. He will increase his protein intake.  We will proceed with Doxil today as plannedand see him again in one month.  He will contact our office with any questions or concerns. We can certainly see him sooner if needed.   Laverna Peace, NP 7/7/20219:08 AM

## 2019-11-22 NOTE — Patient Instructions (Signed)
Doxorubicin Liposomal injection What is this medicine? LIPOSOMAL DOXORUBICIN (LIP oh som al dox oh ROO bi sin) is a chemotherapy drug. This medicine is used to treat many kinds of cancer like Kaposi's sarcoma, multiple myeloma, and ovarian cancer. This medicine may be used for other purposes; ask your health care provider or pharmacist if you have questions. COMMON BRAND NAME(S): Doxil, Lipodox What should I tell my health care provider before I take this medicine? They need to know if you have any of these conditions:  blood disorders  heart disease  infection (especially a virus infection such as chickenpox, cold sores, or herpes)  liver disease  recent or ongoing radiation therapy  an unusual or allergic reaction to doxorubicin, other chemotherapy agents, soybeans, other medicines, foods, dyes, or preservatives  pregnant or trying to get pregnant  breast-feeding How should I use this medicine? This drug is given as an infusion into a vein. It is administered in a hospital or clinic by a specially trained health care professional. If you have pain, swelling, burning or any unusual feeling around the site of your injection, tell your health care professional right away. Talk to your pediatrician regarding the use of this medicine in children. Special care may be needed. Overdosage: If you think you have taken too much of this medicine contact a poison control center or emergency room at once. NOTE: This medicine is only for you. Do not share this medicine with others. What if I miss a dose? It is important not to miss your dose. Call your doctor or health care professional if you are unable to keep an appointment. What may interact with this medicine? Do not take this medicine with any of the following medications:  zidovudine This medicine may also interact with the following medications:  medicines to increase blood counts like filgrastim, pegfilgrastim,  sargramostim  vaccines Talk to your doctor or health care professional before taking any of these medicines:  acetaminophen  aspirin  ibuprofen  ketoprofen  naproxen This list may not describe all possible interactions. Give your health care provider a list of all the medicines, herbs, non-prescription drugs, or dietary supplements you use. Also tell them if you smoke, drink alcohol, or use illegal drugs. Some items may interact with your medicine. What should I watch for while using this medicine? Your condition will be monitored carefully while you are receiving this medicine. You may need blood work done while you are taking this medicine. This drug may make you feel generally unwell. This is not uncommon, as chemotherapy can affect healthy cells as well as cancer cells. Report any side effects. Continue your course of treatment even though you feel ill unless your doctor tells you to stop. Your urine may turn orange-red for a few days after your dose. This is not blood. If your urine is dark or brown, call your doctor. In some cases, you may be given additional medicines to help with side effects. Follow all directions for their use. Talk to your doctor about your risk of cancer. You may be more at risk for certain types of cancers if you take this medicine. Do not become pregnant while taking this medicine or for 6 months after stopping it. Women should inform their healthcare professional if they wish to become pregnant or think they may be pregnant. Men should not father a child while taking this medicine and for 6 months after stopping it. There is a potential for serious side effects to an unborn child.  Talk to your health care professional or pharmacist for more information. Do not breast-feed an infant while taking this medicine. This medicine has caused ovarian failure in some women. This medicine may make it more difficult to get pregnant. Talk to your healthcare professional if  you are concerned about your fertility. This medicine has caused decreased sperm counts in some men. This may make it more difficult to father a child. Talk to your healthcare professional if you are concerned about your fertility. This medicine may cause a decrease in Co-Enzyme Q-10. You should make sure that you get enough Co-Enzyme Q-10 while you are taking this medicine. Discuss the foods you eat and the vitamins you take with your health care professional. What side effects may I notice from receiving this medicine? Side effects that you should report to your doctor or health care professional as soon as possible:  allergic reactions like skin rash, itching or hives, swelling of the face, lips, or tongue  low blood counts - this medicine may decrease the number of white blood cells, red blood cells and platelets. You may be at increased risk for infections and bleeding.  signs of hand-foot syndrome - tingling or burning, redness, flaking, swelling, small blisters, or small sores on the palms of your hands or the soles of your feet  signs of infection - fever or chills, cough, sore throat, pain or difficulty passing urine  signs of decreased platelets or bleeding - bruising, pinpoint red spots on the skin, black, tarry stools, blood in the urine  signs of decreased red blood cells - unusually weak or tired, fainting spells, lightheadedness  back pain, chills, facial flushing, fever, headache, tightness in the chest or throat during the infusion  breathing problems  chest pain  fast, irregular heartbeat  mouth pain, redness, sores  pain, swelling, redness at site where injected  pain, tingling, numbness in the hands or feet  swelling of ankles, feet, or hands  vomiting Side effects that usually do not require medical attention (report to your doctor or health care professional if they continue or are bothersome):  diarrhea  hair loss  loss of appetite  nail discoloration  or damage  nausea  red or watery eyes  red colored urine  stomach upset This list may not describe all possible side effects. Call your doctor for medical advice about side effects. You may report side effects to FDA at 1-800-FDA-1088. Where should I keep my medicine? This drug is given in a hospital or clinic and will not be stored at home. NOTE: This sheet is a summary. It may not cover all possible information. If you have questions about this medicine, talk to your doctor, pharmacist, or health care provider.  2020 Elsevier/Gold Standard (2018-01-10 15:13:26)

## 2019-12-01 ENCOUNTER — Ambulatory Visit: Payer: Medicare Other | Admitting: Podiatry

## 2019-12-20 ENCOUNTER — Other Ambulatory Visit: Payer: Self-pay

## 2019-12-20 ENCOUNTER — Encounter: Payer: Self-pay | Admitting: Podiatry

## 2019-12-20 ENCOUNTER — Other Ambulatory Visit: Payer: Self-pay | Admitting: Family

## 2019-12-20 ENCOUNTER — Inpatient Hospital Stay: Payer: Medicare Other

## 2019-12-20 ENCOUNTER — Inpatient Hospital Stay: Payer: Medicare Other | Attending: Hematology & Oncology

## 2019-12-20 ENCOUNTER — Inpatient Hospital Stay: Payer: Medicare Other | Admitting: Family

## 2019-12-20 ENCOUNTER — Ambulatory Visit (INDEPENDENT_AMBULATORY_CARE_PROVIDER_SITE_OTHER): Payer: Medicare Other | Admitting: Podiatry

## 2019-12-20 VITALS — Temp 98.2°F

## 2019-12-20 DIAGNOSIS — L600 Ingrowing nail: Secondary | ICD-10-CM | POA: Diagnosis not present

## 2019-12-20 DIAGNOSIS — C20 Malignant neoplasm of rectum: Secondary | ICD-10-CM

## 2019-12-20 DIAGNOSIS — C469 Kaposi's sarcoma, unspecified: Secondary | ICD-10-CM

## 2019-12-20 DIAGNOSIS — Z9221 Personal history of antineoplastic chemotherapy: Secondary | ICD-10-CM

## 2019-12-20 MED ORDER — NEOMYCIN-POLYMYXIN-HC 3.5-10000-1 OT SOLN
OTIC | 0 refills | Status: DC
Start: 2019-12-20 — End: 2020-01-25

## 2019-12-20 NOTE — Progress Notes (Signed)
Subjective:   Patient ID: Todd Vincent, male   DOB: 44 y.o.   MRN: [id number]   HPI Patient presents stating has had a chronic ingrown toenail of his left big toe and states it is been making it hard for him to wear shoe gear comfortably and it is right one fixed.  He is HIV positive but keeps it under good control and overall has been in good health recently.  Patient does not smoke likes to be active   Review of Systems  All other systems reviewed and are negative.       Objective:  Physical Exam Vitals and nursing note reviewed.  Constitutional:      Appearance: He is well-developed.  Pulmonary:     Effort: Pulmonary effort is normal.  Musculoskeletal:        General: Normal range of motion.  Skin:    General: Skin is warm.  Neurological:     Mental Status: He is alert.     Neurovascular status intact muscle strength adequate range of motion within normal limits.  Patient is noted to have an incurvated left hallux medial border that is painful when pressed and make shoe gear difficult and states it is been sore and making it hard for him to be comfortable.  Patient has good digital perfusion well oriented x3 with no indications of pathology associated with HIV     Assessment:  Ingrown toenail deformity left hallux medial border with pain     Plan:  H&P reviewed conditions discussed treatment options and he is opted for correction.  I allowed him to read consent form I then went ahead and I infiltrated the left hallux 61 times like Marcaine mixture sterile prep applied and using sterile instrumentation remove the border exposed matrix applied phenol 3 applications 30 seconds followed by alcohol lavage sterile dressing gave instructions on soaks leave dressing on 24 hours but take it off earlier if any recurrent and encouraged him to call with questions concerns

## 2019-12-20 NOTE — Patient Instructions (Signed)

## 2019-12-21 ENCOUNTER — Telehealth: Payer: Self-pay | Admitting: *Deleted

## 2019-12-21 ENCOUNTER — Ambulatory Visit (HOSPITAL_COMMUNITY)
Admission: RE | Admit: 2019-12-21 | Discharge: 2019-12-21 | Disposition: A | Discharge status: Home / Self Care | Payer: Medicare Other | Source: Ambulatory Visit | Attending: Family | Admitting: Family

## 2019-12-21 ENCOUNTER — Encounter: Payer: Self-pay | Admitting: Family

## 2019-12-21 ENCOUNTER — Encounter: Payer: Self-pay | Admitting: Podiatry

## 2019-12-21 DIAGNOSIS — Z0189 Encounter for other specified special examinations: Secondary | ICD-10-CM

## 2019-12-21 DIAGNOSIS — C469 Kaposi's sarcoma, unspecified: Secondary | ICD-10-CM | POA: Diagnosis not present

## 2019-12-21 DIAGNOSIS — C20 Malignant neoplasm of rectum: Secondary | ICD-10-CM | POA: Diagnosis not present

## 2019-12-21 DIAGNOSIS — Z9221 Personal history of antineoplastic chemotherapy: Secondary | ICD-10-CM | POA: Diagnosis not present

## 2019-12-21 LAB — ECHOCARDIOGRAM COMPLETE: S' Lateral: 3.2 cm

## 2019-12-21 MED ORDER — HYDROCODONE-ACETAMINOPHEN 10-325 MG PO TABS: 1.0000 | ORAL_TABLET | Freq: Three times a day (TID) | ORAL | 0 refills | Status: AC | PRN

## 2019-12-21 NOTE — Telephone Encounter (Signed)
I will send in pain med

## 2019-12-21 NOTE — Telephone Encounter (Signed)
-----   Message from Eliezer Bottom, NP sent at 12/21/2019  3:24 PM EDT ----- He still has the heart of a champion!!!! YEE HAW!!!!!   ----- Message ----- From: Interface, [employer] Sent: 12/21/2019   3:10 PM EDT To: Eliezer Bottom, NP

## 2019-12-21 NOTE — Addendum Note (Signed)
Addended by: Wallene Huh on: 12/21/2019 05:37 PM   Modules accepted: Orders

## 2019-12-21 NOTE — Progress Notes (Signed)
  Echocardiogram 2D Echocardiogram has been performed.  Todd Vincent 12/21/2019, 1:48 PM

## 2019-12-21 NOTE — Telephone Encounter (Signed)
Notified pt of results. No concerns at this time. 

## 2019-12-29 ENCOUNTER — Encounter: Payer: Self-pay | Admitting: Family

## 2020-01-05 ENCOUNTER — Other Ambulatory Visit: Payer: Self-pay | Admitting: *Deleted

## 2020-01-05 DIAGNOSIS — B029 Zoster without complications: Secondary | ICD-10-CM

## 2020-01-05 DIAGNOSIS — C469 Kaposi's sarcoma, unspecified: Secondary | ICD-10-CM

## 2020-01-05 MED ORDER — PREGABALIN 75 MG PO CAPS: 75.0000 mg | ORAL_CAPSULE | Freq: Two times a day (BID) | ORAL | 3 refills | Status: DC

## 2020-01-17 ENCOUNTER — Other Ambulatory Visit: Payer: Medicare Other

## 2020-01-17 ENCOUNTER — Ambulatory Visit: Payer: Medicare Other | Admitting: Family

## 2020-01-17 ENCOUNTER — Ambulatory Visit: Payer: Medicare Other

## 2020-01-25 ENCOUNTER — Inpatient Hospital Stay: Payer: Medicare Other | Attending: Hematology & Oncology

## 2020-01-25 ENCOUNTER — Encounter: Payer: Self-pay | Admitting: Family

## 2020-01-25 ENCOUNTER — Telehealth: Payer: Self-pay | Admitting: Family

## 2020-01-25 ENCOUNTER — Inpatient Hospital Stay (HOSPITAL_BASED_OUTPATIENT_CLINIC_OR_DEPARTMENT_OTHER): Payer: Medicare Other | Admitting: Family

## 2020-01-25 ENCOUNTER — Other Ambulatory Visit: Payer: Self-pay

## 2020-01-25 ENCOUNTER — Inpatient Hospital Stay: Payer: Medicare Other

## 2020-01-25 VITALS — BP 131/88 | HR 86 | Temp 98.3°F | Resp 19 | Ht 67.0 in | Wt 151.0 lb

## 2020-01-25 DIAGNOSIS — Z85048 Personal history of other malignant neoplasm of rectum, rectosigmoid junction, and anus: Secondary | ICD-10-CM | POA: Diagnosis not present

## 2020-01-25 DIAGNOSIS — C20 Malignant neoplasm of rectum: Secondary | ICD-10-CM

## 2020-01-25 DIAGNOSIS — C469 Kaposi's sarcoma, unspecified: Secondary | ICD-10-CM | POA: Diagnosis present

## 2020-01-25 DIAGNOSIS — B20 Human immunodeficiency virus [HIV] disease: Secondary | ICD-10-CM | POA: Diagnosis not present

## 2020-01-25 DIAGNOSIS — Z5111 Encounter for antineoplastic chemotherapy: Secondary | ICD-10-CM | POA: Diagnosis not present

## 2020-01-25 LAB — CBC WITH DIFFERENTIAL (CANCER CENTER ONLY)
Abs Immature Granulocytes: 0.02 10*3/uL (ref 0.00–0.07)
Basophils Absolute: 0 10*3/uL (ref 0.0–0.1)
Basophils Relative: 0 %
Eosinophils Absolute: 0.3 10*3/uL (ref 0.0–0.5)
Eosinophils Relative: 4 %
HCT: 41.2 % (ref 39.0–52.0)
Hemoglobin: 14 g/dL (ref 13.0–17.0)
Immature Granulocytes: 0 %
Lymphocytes Relative: 29 %
Lymphs Abs: 2 10*3/uL (ref 0.7–4.0)
MCH: 31.3 pg (ref 26.0–34.0)
MCHC: 34 g/dL (ref 30.0–36.0)
MCV: 92 fL (ref 80.0–100.0)
Monocytes Absolute: 0.4 10*3/uL (ref 0.1–1.0)
Monocytes Relative: 6 %
Neutro Abs: 4.1 10*3/uL (ref 1.7–7.7)
Neutrophils Relative %: 61 %
Platelet Count: 307 10*3/uL (ref 150–400)
RBC: 4.48 MIL/uL (ref 4.22–5.81)
RDW: 13.3 % (ref 11.5–15.5)
WBC Count: 6.9 10*3/uL (ref 4.0–10.5)
nRBC: 0 % (ref 0.0–0.2)

## 2020-01-25 LAB — CMP (CANCER CENTER ONLY)
ALT: 7 U/L (ref 0–44)
AST: 9 U/L — ABNORMAL LOW (ref 15–41)
Albumin: 3.7 g/dL (ref 3.5–5.0)
Alkaline Phosphatase: 71 U/L (ref 38–126)
Anion gap: 7 (ref 5–15)
BUN: 9 mg/dL (ref 6–20)
CO2: 27 mmol/L (ref 22–32)
Calcium: 9.3 mg/dL (ref 8.9–10.3)
Chloride: 105 mmol/L (ref 98–111)
Creatinine: 0.96 mg/dL (ref 0.61–1.24)
GFR, Est AFR Am: >60 mL/min (ref >60)
GFR, Estimated: >60 mL/min (ref >60)
Glucose, Bld: 141 mg/dL — ABNORMAL HIGH (ref 70–99)
Potassium: 3.6 mmol/L (ref 3.5–5.1)
Sodium: 139 mmol/L (ref 135–145)
Total Bilirubin: 0.4 mg/dL (ref 0.3–1.2)
Total Protein: 7 g/dL (ref 6.5–8.1)

## 2020-01-25 LAB — LACTATE DEHYDROGENASE: LDH: 169 U/L (ref 98–192)

## 2020-01-25 MED ORDER — ONDANSETRON HCL 8 MG PO TABS
ORAL_TABLET | ORAL | Status: AC
  Filled 2020-01-25: qty 1

## 2020-01-25 MED ORDER — DEXTROSE 5 % IV SOLN
Freq: Once | INTRAVENOUS | Status: AC
  Filled 2020-01-25: qty 250

## 2020-01-25 MED ORDER — DOXORUBICIN HCL LIPOSOMAL CHEMO INJECTION 2 MG/ML
20.0000 mg/m2 | Freq: Once | INTRAVENOUS | Status: AC
  Administered 2020-01-25: 36 mg via INTRAVENOUS
  Filled 2020-01-25: qty 18

## 2020-01-25 MED ORDER — ONDANSETRON HCL 8 MG PO TABS
8.0000 mg | ORAL_TABLET | Freq: Once | ORAL | Status: AC
  Administered 2020-01-25: 8 mg via ORAL

## 2020-01-25 MED ORDER — SODIUM CHLORIDE 0.9 % IV SOLN
10.0000 mg | Freq: Once | INTRAVENOUS | Status: AC
  Administered 2020-01-25: 10 mg via INTRAVENOUS
  Filled 2020-01-25: qty 10

## 2020-01-25 NOTE — Telephone Encounter (Signed)
Appointments scheduled patient will get updates from my Chart per 9/9 los

## 2020-01-25 NOTE — Patient Instructions (Signed)
Doxorubicin Liposomal injection What is this medicine? LIPOSOMAL DOXORUBICIN (LIP oh som al dox oh ROO bi sin) is a chemotherapy drug. This medicine is used to treat many kinds of cancer like Kaposi's sarcoma, multiple myeloma, and ovarian cancer. This medicine may be used for other purposes; ask your health care provider or pharmacist if you have questions. COMMON BRAND NAME(S): Doxil, Lipodox What should I tell my health care provider before I take this medicine? They need to know if you have any of these conditions:  blood disorders  heart disease  infection (especially a virus infection such as chickenpox, cold sores, or herpes)  liver disease  recent or ongoing radiation therapy  an unusual or allergic reaction to doxorubicin, other chemotherapy agents, soybeans, other medicines, foods, dyes, or preservatives  pregnant or trying to get pregnant  breast-feeding How should I use this medicine? This drug is given as an infusion into a vein. It is administered in a hospital or clinic by a specially trained health care professional. If you have pain, swelling, burning or any unusual feeling around the site of your injection, tell your health care professional right away. Talk to your pediatrician regarding the use of this medicine in children. Special care may be needed. Overdosage: If you think you have taken too much of this medicine contact a poison control center or emergency room at once. NOTE: This medicine is only for you. Do not share this medicine with others. What if I miss a dose? It is important not to miss your dose. Call your doctor or health care professional if you are unable to keep an appointment. What may interact with this medicine? Do not take this medicine with any of the following medications:  zidovudine This medicine may also interact with the following medications:  medicines to increase blood counts like filgrastim, pegfilgrastim,  sargramostim  vaccines Talk to your doctor or health care professional before taking any of these medicines:  acetaminophen  aspirin  ibuprofen  ketoprofen  naproxen This list may not describe all possible interactions. Give your health care provider a list of all the medicines, herbs, non-prescription drugs, or dietary supplements you use. Also tell them if you smoke, drink alcohol, or use illegal drugs. Some items may interact with your medicine. What should I watch for while using this medicine? Your condition will be monitored carefully while you are receiving this medicine. You may need blood work done while you are taking this medicine. This drug may make you feel generally unwell. This is not uncommon, as chemotherapy can affect healthy cells as well as cancer cells. Report any side effects. Continue your course of treatment even though you feel ill unless your doctor tells you to stop. Your urine may turn orange-red for a few days after your dose. This is not blood. If your urine is dark or brown, call your doctor. In some cases, you may be given additional medicines to help with side effects. Follow all directions for their use. Talk to your doctor about your risk of cancer. You may be more at risk for certain types of cancers if you take this medicine. Do not become pregnant while taking this medicine or for 6 months after stopping it. Women should inform their healthcare professional if they wish to become pregnant or think they may be pregnant. Men should not father a child while taking this medicine and for 6 months after stopping it. There is a potential for serious side effects to an unborn child.  Talk to your health care professional or pharmacist for more information. Do not breast-feed an infant while taking this medicine. This medicine has caused ovarian failure in some women. This medicine may make it more difficult to get pregnant. Talk to your healthcare professional if  you are concerned about your fertility. This medicine has caused decreased sperm counts in some men. This may make it more difficult to father a child. Talk to your healthcare professional if you are concerned about your fertility. This medicine may cause a decrease in Co-Enzyme Q-10. You should make sure that you get enough Co-Enzyme Q-10 while you are taking this medicine. Discuss the foods you eat and the vitamins you take with your health care professional. What side effects may I notice from receiving this medicine? Side effects that you should report to your doctor or health care professional as soon as possible:  allergic reactions like skin rash, itching or hives, swelling of the face, lips, or tongue  low blood counts - this medicine may decrease the number of white blood cells, red blood cells and platelets. You may be at increased risk for infections and bleeding.  signs of hand-foot syndrome - tingling or burning, redness, flaking, swelling, small blisters, or small sores on the palms of your hands or the soles of your feet  signs of infection - fever or chills, cough, sore throat, pain or difficulty passing urine  signs of decreased platelets or bleeding - bruising, pinpoint red spots on the skin, black, tarry stools, blood in the urine  signs of decreased red blood cells - unusually weak or tired, fainting spells, lightheadedness  back pain, chills, facial flushing, fever, headache, tightness in the chest or throat during the infusion  breathing problems  chest pain  fast, irregular heartbeat  mouth pain, redness, sores  pain, swelling, redness at site where injected  pain, tingling, numbness in the hands or feet  swelling of ankles, feet, or hands  vomiting Side effects that usually do not require medical attention (report to your doctor or health care professional if they continue or are bothersome):  diarrhea  hair loss  loss of appetite  nail discoloration  or damage  nausea  red or watery eyes  red colored urine  stomach upset This list may not describe all possible side effects. Call your doctor for medical advice about side effects. You may report side effects to FDA at 1-800-FDA-1088. Where should I keep my medicine? This drug is given in a hospital or clinic and will not be stored at home. NOTE: This sheet is a summary. It may not cover all possible information. If you have questions about this medicine, talk to your doctor, pharmacist, or health care provider.  2020 Elsevier/Gold Standard (2018-01-10 15:13:26)

## 2020-01-25 NOTE — Addendum Note (Signed)
Addended by: Burney Gauze R on: 01/25/2020 10:47 AM   Modules accepted: Orders

## 2020-01-25 NOTE — Progress Notes (Signed)
Hematology and Oncology Follow Up Visit  Todd Vincent [id number] 1975-10-07 44 y.o. 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 He is doing well but has noted a little increase in his fatigue.  No skin changes. No lesions. Hyperpigmentation fading.  ECHO last month EF was 55-60%.  No fever, chills, n/v, cough, rash, SOB, chest pain, palpitations, abdominal pain or changes in bowel or bladder habits.  Ostomy is functioning appropriately.  No episodes of bleeding. No bruising or petechiae.  The swelling in his lower extremities is stable on Maxzide.  The neuropathy in his hands and feet is a little more prominent but tolerable at this time.  He has vertigo which can sometimes be exacerbated by treatment. He takes Antivert as needed which helps.  No falls or syncopal episodes to report.  He is eating well and staying hydrated. His weight is stable at 151 lbs.   ECOG Performance Status: 1 - Symptomatic but completely ambulatory  Medications:  Allergies as of 01/25/2020      Reactions   Sulfa Antibiotics Rash, Itching   Extreme itching    Sulfasalazine Itching, Rash      Medication List       Accurate as of January 25, 2020  9:46 AM. If you have any questions, ask your nurse or doctor.        STOP taking these medications   acyclovir 800 MG tablet Commonly known as: ZOVIRAX Stopped by: Laverna Peace, NP   cyclobenzaprine 5 MG tablet Commonly known as: FLEXERIL Stopped by: Laverna Peace, NP   imiquimod 5 % cream Commonly known as: ALDARA Stopped by: Laverna Peace, NP   neomycin-polymyxin-hydrocortisone OTIC solution Commonly known as: CORTISPORIN Stopped by: Laverna Peace, NP      TAKE these medications   ALPRAZolam 0.5 MG tablet Commonly known as: XANAX Take 1 tablet (0.5 mg total) by mouth at bedtime as needed for anxiety.   citalopram 10 MG tablet Commonly known as: CELEXA Take 1 tablet by mouth daily.   dronabinol 5 MG capsule Commonly known as: MARINOL Take one capsule by  Mouth twice daily before lunch and supper   Genvoya 150-150-200-10 MG Tabs tablet Generic drug: elvitegravir-cobicistat-emtricitabine-tenofovir TAKE 1 TABLET BY MOUTH DAILY   LORazepam 0.5 MG tablet Commonly known as: ATIVAN Take 1 tablet (0.5 mg total) by mouth every 6 (six) hours as needed for anxiety.   meclizine 25 MG tablet Commonly known as: ANTIVERT Take 1 tablet (25 mg total) by mouth every 6 (six) hours as needed for dizziness.   ondansetron 8 MG tablet Commonly known as: ZOFRAN Take 1 tablet (8 mg total) by mouth 2 (two) times daily. Start the day after chemo for 2 days. Then take as needed for nausea or vomiting.   pregabalin 75 MG capsule Commonly known as: LYRICA Take 1 capsule (75 mg total) by mouth 2 (two) times daily.   Prezista 800 MG tablet Generic drug: darunavir TAKE 1 TABLET(800 MG) BY MOUTH DAILY WITH BREAKFAST   triamterene-hydrochlorothiazide 37.5-25 MG tablet Commonly known as: MAXZIDE-25 TAKE 1 TABLET BY MOUTH EVERY DAY   valACYclovir 1000 MG tablet Commonly known as: VALTREX Take 1 tablet (1,000 mg total) by mouth daily.       Allergies:  Allergies  Allergen Reactions  . Sulfa  Antibiotics Rash and Itching    Extreme itching   . Sulfasalazine Itching and Rash    Past Medical History, Surgical history, Social history, and Family History were reviewed and updated.  Review of Systems: All other 10 point review of systems is negative.   Physical Exam:  height is 5\' 7"  (1.702 m) and weight is 151 lb 0.6 oz (68.5 kg). His oral temperature is 98.3 F (36.8 C). His blood pressure is 131/88 and his pulse is 86. His respiration is 19 and  oxygen saturation is 100%.   Wt Readings from Last 3 Encounters:  01/25/20 151 lb 0.6 oz (68.5 kg)  11/22/19 146 lb 1.6 oz (66.3 kg)  10/23/19 150 lb (68 kg)    Ocular: Sclerae unicteric, pupils equal, round and reactive to light Ear-nose-throat: Oropharynx clear, dentition fair Lymphatic: No cervical or supraclavicular adenopathy Lungs no rales or rhonchi, good excursion bilaterally Heart regular rate and rhythm, no murmur appreciated Abd soft, nontender, positive bowel sounds MSK no focal spinal tenderness, no joint edema Neuro: non-focal, well-oriented, appropriate affect Breasts: Deferred   Lab Results  Component Value Date   WBC 6.9 01/25/2020   HGB 14.0 01/25/2020   HCT 41.2 01/25/2020   MCV 92.0 01/25/2020   PLT 307 01/25/2020   Lab Results  Component Value Date   FERRITIN 42 03/28/2019   IRON 81 03/28/2019   TIBC 301 03/28/2019   UIBC 220 03/28/2019   IRONPCTSAT 27 03/28/2019   Lab Results  Component Value Date   RBC 4.48 01/25/2020   No results found for: KPAFRELGTCHN, LAMBDASER, KAPLAMBRATIO No results found for: IGGSERUM, IGA, IGMSERUM No results found for: Kathrynn Ducking, MSPIKE, SPEI   Chemistry      Component Value Date/Time   NA 139 11/22/2019 0804   NA 141 04/19/2017 1007   NA 141 12/13/2015 0827   K 4.0 11/22/2019 0804   K 3.4 04/19/2017 1007   K 4.0 12/13/2015 0827   CL 106 11/22/2019 0804   CL 105 04/19/2017 1007   CO2 27 11/22/2019 0804   CO2 27 04/19/2017 1007   CO2 26 12/13/2015 0827   BUN 9 11/22/2019 0804   BUN 8 04/19/2017 1007   BUN 8.2 12/13/2015 0827   CREATININE 0.88 11/22/2019 0804   CREATININE 1.09 08/07/2019 0928   CREATININE 1.2 12/13/2015 0827      Component Value Date/Time   CALCIUM 9.3 11/22/2019 0804   CALCIUM 9.3 04/19/2017 1007   CALCIUM 9.2 12/13/2015 0827   ALKPHOS 71 11/22/2019 0804   ALKPHOS 82 04/19/2017 1007   ALKPHOS 78 12/13/2015 0827   AST 12 (L) 11/22/2019  0804   AST 14 12/13/2015 0827   ALT 9 11/22/2019 0804   ALT 23 04/19/2017 1007   ALT 13 12/13/2015 0827   BILITOT 0.3 11/22/2019 0804   BILITOT 0.40 12/13/2015 0827       Impression and Plan: Todd Vincent is a very pleasant 44 yo African American gentleman with Kaposi's sarcoma as well as history of rectal cancer. We will proceed with treatment today as planned.  Follow-up in 1 month.  He will contact our office with any questions or concerns.   Laverna Peace, NP 9/9/20219:46 AM

## 2020-02-14 ENCOUNTER — Other Ambulatory Visit: Payer: Medicare Other

## 2020-02-14 ENCOUNTER — Ambulatory Visit: Payer: Medicare Other | Admitting: Hematology & Oncology

## 2020-02-14 ENCOUNTER — Ambulatory Visit: Payer: Medicare Other

## 2020-02-21 ENCOUNTER — Other Ambulatory Visit: Payer: Self-pay

## 2020-02-21 ENCOUNTER — Other Ambulatory Visit: Payer: Medicare Other

## 2020-02-21 DIAGNOSIS — B20 Human immunodeficiency virus [HIV] disease: Secondary | ICD-10-CM

## 2020-02-22 ENCOUNTER — Telehealth: Payer: Self-pay | Admitting: Family

## 2020-02-22 ENCOUNTER — Encounter: Payer: Self-pay | Admitting: Family

## 2020-02-22 ENCOUNTER — Other Ambulatory Visit: Payer: Self-pay | Admitting: *Deleted

## 2020-02-22 ENCOUNTER — Other Ambulatory Visit: Payer: Self-pay | Admitting: Family

## 2020-02-22 ENCOUNTER — Inpatient Hospital Stay: Payer: Medicare Other | Attending: Hematology & Oncology

## 2020-02-22 ENCOUNTER — Other Ambulatory Visit: Payer: Self-pay

## 2020-02-22 ENCOUNTER — Inpatient Hospital Stay (HOSPITAL_BASED_OUTPATIENT_CLINIC_OR_DEPARTMENT_OTHER): Payer: Medicare Other | Admitting: Family

## 2020-02-22 ENCOUNTER — Inpatient Hospital Stay: Payer: Medicare Other

## 2020-02-22 VITALS — BP 118/84 | HR 112 | Temp 99.2°F | Resp 18 | Ht 67.0 in | Wt 141.1 lb

## 2020-02-22 DIAGNOSIS — C469 Kaposi's sarcoma, unspecified: Secondary | ICD-10-CM

## 2020-02-22 DIAGNOSIS — Z5111 Encounter for antineoplastic chemotherapy: Secondary | ICD-10-CM | POA: Diagnosis not present

## 2020-02-22 DIAGNOSIS — C20 Malignant neoplasm of rectum: Secondary | ICD-10-CM | POA: Insufficient documentation

## 2020-02-22 DIAGNOSIS — K6289 Other specified diseases of anus and rectum: Secondary | ICD-10-CM

## 2020-02-22 DIAGNOSIS — G893 Neoplasm related pain (acute) (chronic): Secondary | ICD-10-CM

## 2020-02-22 DIAGNOSIS — B20 Human immunodeficiency virus [HIV] disease: Secondary | ICD-10-CM | POA: Insufficient documentation

## 2020-02-22 DIAGNOSIS — E860 Dehydration: Secondary | ICD-10-CM | POA: Insufficient documentation

## 2020-02-22 DIAGNOSIS — B029 Zoster without complications: Secondary | ICD-10-CM

## 2020-02-22 LAB — CMP (CANCER CENTER ONLY)
ALT: 12 U/L (ref 0–44)
AST: 11 U/L — ABNORMAL LOW (ref 15–41)
Albumin: 3.9 g/dL (ref 3.5–5.0)
Alkaline Phosphatase: 71 U/L (ref 38–126)
Anion gap: 11 (ref 5–15)
BUN: 8 mg/dL (ref 6–20)
CO2: 30 mmol/L (ref 22–32)
Calcium: 10.4 mg/dL — ABNORMAL HIGH (ref 8.9–10.3)
Chloride: 94 mmol/L — ABNORMAL LOW (ref 98–111)
Creatinine: 1.08 mg/dL (ref 0.61–1.24)
GFR, Estimated: >60 mL/min (ref >60)
Glucose, Bld: 189 mg/dL — ABNORMAL HIGH (ref 70–99)
Potassium: 3.5 mmol/L (ref 3.5–5.1)
Sodium: 135 mmol/L (ref 135–145)
Total Bilirubin: 0.3 mg/dL (ref 0.3–1.2)
Total Protein: 7.6 g/dL (ref 6.5–8.1)

## 2020-02-22 LAB — CBC WITH DIFFERENTIAL (CANCER CENTER ONLY)
Abs Immature Granulocytes: 0.05 10*3/uL (ref 0.00–0.07)
Basophils Absolute: 0 10*3/uL (ref 0.0–0.1)
Basophils Relative: 0 %
Eosinophils Absolute: 0.2 10*3/uL (ref 0.0–0.5)
Eosinophils Relative: 2 %
HCT: 39.7 % (ref 39.0–52.0)
Hemoglobin: 13.6 g/dL (ref 13.0–17.0)
Immature Granulocytes: 1 %
Lymphocytes Relative: 19 %
Lymphs Abs: 1.9 10*3/uL (ref 0.7–4.0)
MCH: 31.2 pg (ref 26.0–34.0)
MCHC: 34.3 g/dL (ref 30.0–36.0)
MCV: 91.1 fL (ref 80.0–100.0)
Monocytes Absolute: 1 10*3/uL (ref 0.1–1.0)
Monocytes Relative: 10 %
Neutro Abs: 6.7 10*3/uL (ref 1.7–7.7)
Neutrophils Relative %: 68 %
Platelet Count: 429 10*3/uL — ABNORMAL HIGH (ref 150–400)
RBC: 4.36 MIL/uL (ref 4.22–5.81)
RDW: 12.4 % (ref 11.5–15.5)
WBC Count: 9.9 10*3/uL (ref 4.0–10.5)
nRBC: 0 % (ref 0.0–0.2)

## 2020-02-22 LAB — T-HELPER CELL (CD4) - (RCID CLINIC ONLY)
CD4 % Helper T Cell: 18 % — ABNORMAL LOW (ref 33–65)
CD4 T Cell Abs: 450 /uL (ref 400–1790)

## 2020-02-22 LAB — LACTATE DEHYDROGENASE: LDH: 158 U/L (ref 98–192)

## 2020-02-22 MED ORDER — DOXORUBICIN HCL LIPOSOMAL CHEMO INJECTION 2 MG/ML
20.0000 mg/m2 | Freq: Once | INTRAVENOUS | Status: AC
  Administered 2020-02-22: 36 mg via INTRAVENOUS
  Filled 2020-02-22: qty 18

## 2020-02-22 MED ORDER — HYDROMORPHONE HCL 1 MG/ML IJ SOLN
2.0000 mg | Freq: Once | INTRAMUSCULAR | Status: AC
  Administered 2020-02-22: 2 mg via INTRAVENOUS

## 2020-02-22 MED ORDER — TRAMADOL HCL 50 MG PO TABS
50.0000 mg | ORAL_TABLET | Freq: Three times a day (TID) | ORAL | 0 refills | Status: DC | PRN
Start: 2020-02-22 — End: 2020-02-22

## 2020-02-22 MED ORDER — ONDANSETRON HCL 8 MG PO TABS
8.0000 mg | ORAL_TABLET | Freq: Once | ORAL | Status: AC
  Administered 2020-02-22: 8 mg via ORAL

## 2020-02-22 MED ORDER — TRAMADOL HCL 50 MG PO TABS: 50.0000 mg | ORAL_TABLET | Freq: Three times a day (TID) | ORAL | 0 refills | Status: DC | PRN

## 2020-02-22 MED ORDER — HYDROMORPHONE HCL 1 MG/ML IJ SOLN
INTRAMUSCULAR | Status: AC
Start: 2020-02-22 — End: ?
  Filled 2020-02-22: qty 2

## 2020-02-22 MED ORDER — VALACYCLOVIR HCL 1 G PO TABS: 1000.0000 mg | ORAL_TABLET | Freq: Every day | ORAL | 1 refills | Status: DC

## 2020-02-22 MED ORDER — ONDANSETRON HCL 8 MG PO TABS
ORAL_TABLET | ORAL | Status: AC
  Filled 2020-02-22: qty 1

## 2020-02-22 MED ORDER — SODIUM CHLORIDE 0.9 % IV SOLN
Freq: Once | INTRAVENOUS | Status: AC
  Filled 2020-02-22: qty 250

## 2020-02-22 MED ORDER — HYDROMORPHONE HCL 1 MG/ML IJ SOLN: 2.0000 mg | Freq: Once | INTRAMUSCULAR | Status: DC

## 2020-02-22 MED ORDER — SODIUM CHLORIDE 0.9 % IV SOLN
10.0000 mg | Freq: Once | INTRAVENOUS | Status: AC
  Administered 2020-02-22: 10 mg via INTRAVENOUS
  Filled 2020-02-22: qty 10

## 2020-02-22 MED ORDER — DEXTROSE 5 % IV SOLN
Freq: Once | INTRAVENOUS | Status: AC
  Filled 2020-02-22: qty 250

## 2020-02-22 MED FILL — traMADol HCL 50 MG TABS: 50 | 30 days supply | Qty: 90 | Fill #0

## 2020-02-22 NOTE — Telephone Encounter (Signed)
Appointments scheduled calendar printed per 10/7 los °

## 2020-02-22 NOTE — Progress Notes (Signed)
Hematology and Oncology Follow Up Visit  Todd Vincent [id number] August 06, 1975 44 y.o. 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 Unfortunately it sounds like his rectal cancer has returned. He was able to pull up his September MRI for [place] to read but the report is not in [facility] yet.  He is having a lot of pain in his bottom and also has noted a loss in sensation letting him know that he needs to urinate. His bladder will fill but not always wants to empty.  He has been afraid to hydrate because of this despite being on a diuretic. His chloride is a little low at 94 and has has had left leg cramping.  He has intermittent numbness in his legs and hands. Certain positions can exacerbate his symptoms.  He has intermittent abdominal pain.  He does have hot flashes, no night sweats.  No fever, chills, n/v, cough, rash, dizziness, SOB, chest pain, palpitations, or changes in bowel habits.  He states that his ostomy is functioning appropriately.  No blood loss noted. No abnormal bruising or petechiae.  The swelling in his legs seems to be stable on Maxzide. Pedal pulses are 2+.  No falls or syncopal episodes to report.  He has no appetite and his weight is down 10 lbs since his last visit at 141 lbs.   ECOG Performance Status: 2 - Symptomatic, <50% confined to bed  Medications:  Allergies as of 02/22/2020      Reactions   Sulfa Antibiotics Rash, Itching   Extreme itching    Sulfasalazine Itching, Rash      Medication List       Accurate as of February 22, 2020  9:44 AM. If you have any questions, ask your nurse or doctor.        ALPRAZolam 0.5 MG tablet Commonly known as: XANAX Take 1  tablet (0.5 mg total) by mouth at bedtime as needed for anxiety.   citalopram 10 MG tablet Commonly known as: CELEXA Take 1 tablet by mouth daily.   dronabinol 5 MG capsule Commonly known as: MARINOL Take one capsule by  Mouth twice daily before lunch and supper   Genvoya 150-150-200-10 MG Tabs tablet Generic drug: elvitegravir-cobicistat-emtricitabine-tenofovir TAKE 1 TABLET BY MOUTH DAILY   LORazepam 0.5 MG tablet Commonly known as: ATIVAN Take 1 tablet (0.5 mg total) by mouth every 6 (six) hours as needed for anxiety.   meclizine 25 MG tablet Commonly known as: ANTIVERT Take 1 tablet (25 mg total) by mouth every 6 (six) hours as needed for dizziness.   ondansetron 8 MG tablet Commonly known as: ZOFRAN Take 1 tablet (8 mg total) by mouth 2 (two) times daily. Start the day after chemo for 2 days. Then take as needed for nausea or vomiting.   pregabalin 75 MG capsule Commonly known as: LYRICA Take 1 capsule (75 mg total) by mouth 2 (two) times daily.   Prezista 800 MG tablet Generic drug: darunavir TAKE 1 TABLET(800 MG) BY MOUTH DAILY WITH BREAKFAST   triamterene-hydrochlorothiazide 37.5-25 MG tablet Commonly known as: MAXZIDE-25 TAKE 1 TABLET BY MOUTH EVERY DAY   valACYclovir 1000 MG tablet Commonly known as: VALTREX Take 1 tablet (1,000 mg total) by mouth daily.       Allergies:  Allergies  Allergen Reactions  . Sulfa Antibiotics Rash and Itching    Extreme itching   . Sulfasalazine Itching and Rash    Past Medical History, Surgical history, Social history, and Family History were reviewed and updated.  Review of Systems: All other 10 point review of systems is negative.   Physical Exam:  vitals were not taken for this visit.   Wt Readings from Last 3 Encounters:  01/25/20 151 lb 0.6 oz (68.5 kg)  11/22/19 146 lb 1.6 oz (66.3 kg)  10/23/19 150 lb (68 kg)    Ocular: Sclerae unicteric, pupils equal, round and reactive to light Ear-nose-throat:  Oropharynx clear, dentition fair Lymphatic: No cervical or supraclavicular adenopathy Lungs no rales or rhonchi, good excursion bilaterally Heart regular rate and rhythm, no murmur appreciated Abd soft, nontender, positive bowel sounds, no liver or spleen tip palpated on exam, no fluid wave  MSK no focal spinal tenderness, no joint edema Neuro: non-focal, well-oriented, appropriate affect Breasts: Deferred   Lab Results  Component Value Date   WBC 6.9 01/25/2020   HGB 14.0 01/25/2020   HCT 41.2 01/25/2020   MCV 92.0 01/25/2020   PLT 307 01/25/2020   Lab Results  Component Value Date   FERRITIN 42 03/28/2019   IRON 81 03/28/2019   TIBC 301 03/28/2019   UIBC 220 03/28/2019   IRONPCTSAT 27 03/28/2019   Lab Results  Component Value Date   RBC 4.48 01/25/2020   No results found for: KPAFRELGTCHN, LAMBDASER, KAPLAMBRATIO No results found for: IGGSERUM, IGA, IGMSERUM No results found for: Kathrynn Ducking, MSPIKE, SPEI   Chemistry      Component Value Date/Time   NA 139 01/25/2020 0915   NA 141 04/19/2017 1007   NA 141 12/13/2015 0827   K 3.6 01/25/2020 0915   K 3.4 04/19/2017 1007   K 4.0 12/13/2015 0827   CL 105 01/25/2020 0915   CL 105 04/19/2017 1007   CO2 27 01/25/2020 0915   CO2 27 04/19/2017 1007   CO2 26 12/13/2015 0827   BUN 9 01/25/2020 0915   BUN 8 04/19/2017 1007   BUN 8.2 12/13/2015 0827   CREATININE 0.96 01/25/2020 0915   CREATININE 1.09 08/07/2019 0928   CREATININE 1.2 12/13/2015 0827      Component Value Date/Time   CALCIUM 9.3 01/25/2020 0915   CALCIUM 9.3 04/19/2017 1007   CALCIUM 9.2 12/13/2015 0827   ALKPHOS 71 01/25/2020 0915   ALKPHOS 82 04/19/2017 1007   ALKPHOS 78 12/13/2015 0827   AST 9 (L) 01/25/2020 0915   AST 14 12/13/2015 0827   ALT 7 01/25/2020 0915   ALT 23 04/19/2017 1007   ALT 13 12/13/2015 0827   BILITOT 0.4 01/25/2020 0915   BILITOT 0.40 12/13/2015 0827       Impression and  Plan: Todd Vincent is a very pleasant 44yo African American gentleman with Kaposi's sarcoma as well as history of rectal cancer. Unfortunately his rectal cancer has returned. His team with [facility] is presenting his case at tumor boards this week to help determine a treatment plan. He will let us know what they decide to do.  He has not been taking his Marinol and will restart today. He also supplements with boost and ensure.  I updated Dr. Marin Olp and we will proceed with treatment today as planned but will likely hold future treatments for a while if he starts chemo and radiation.  He is dehydrated and will get extra fluids as  well as IV pain medication while here today.  I am sending him home with a 1 month supply of Ultram to help with his pain while he waits to get back in with his oncologist in [place]. He is quite miserable with the rectal pain at this time but states that he does not have any feelings of self harm.  We will plan to see him again in another month.  He promises to contact our office with any questions or concerns. We can certainly see him sooner if needed.  Laverna Peace, NP 10/7/20219:44 AM

## 2020-02-22 NOTE — Patient Instructions (Signed)
[facility] Discharge Instructions for Patients Receiving Chemotherapy  Today you received the following chemotherapy agents Doxil  To help prevent nausea and vomiting after your treatment, we encourage you to take your nausea medication as prescribed by MD.   If you develop nausea and vomiting that is not controlled by your nausea medication, call the clinic.   BELOW ARE SYMPTOMS THAT SHOULD BE REPORTED IMMEDIATELY:  *FEVER GREATER THAN 100.5 F  *CHILLS WITH OR WITHOUT FEVER  NAUSEA AND VOMITING THAT IS NOT CONTROLLED WITH YOUR NAUSEA MEDICATION  *UNUSUAL SHORTNESS OF BREATH  *UNUSUAL BRUISING OR BLEEDING  TENDERNESS IN MOUTH AND THROAT WITH OR WITHOUT PRESENCE OF ULCERS  *URINARY PROBLEMS  *BOWEL PROBLEMS  UNUSUAL RASH Items with * indicate a potential emergency and should be followed up as soon as possible.  Feel free to call the clinic should you have any questions or concerns. The clinic phone number is [phone].  Please show the [facility] at check-in to the Emergency Department and triage nurse.

## 2020-02-24 LAB — HIV-1 RNA QUANT-NO REFLEX-BLD
HIV 1 RNA Quant: <20 Copies/mL
HIV-1 RNA Quant, Log: <1.3 Log cps/mL

## 2020-03-06 ENCOUNTER — Encounter: Payer: Self-pay | Admitting: Family

## 2020-03-07 ENCOUNTER — Encounter: Payer: Self-pay | Admitting: Internal Medicine

## 2020-03-07 ENCOUNTER — Ambulatory Visit (INDEPENDENT_AMBULATORY_CARE_PROVIDER_SITE_OTHER): Payer: Medicare Other | Admitting: Internal Medicine

## 2020-03-07 ENCOUNTER — Other Ambulatory Visit: Payer: Self-pay

## 2020-03-07 VITALS — BP 129/79 | HR 102 | Wt 139.0 lb

## 2020-03-07 DIAGNOSIS — Z23 Encounter for immunization: Secondary | ICD-10-CM

## 2020-03-07 DIAGNOSIS — B20 Human immunodeficiency virus [HIV] disease: Secondary | ICD-10-CM

## 2020-03-07 DIAGNOSIS — Z113 Encounter for screening for infections with a predominantly sexual mode of transmission: Secondary | ICD-10-CM | POA: Diagnosis not present

## 2020-03-07 DIAGNOSIS — F32A Depression, unspecified: Secondary | ICD-10-CM | POA: Diagnosis not present

## 2020-03-07 NOTE — Progress Notes (Signed)
   Subjective:    Patient ID: Todd Vincent, male    DOB: [birth year], 44 y.o.   MRN: [id number]  HIV Positive/AIDS  Here for follow up of HIV He continues on Genvoya and prezista and no missed doses. CD4 of 450 and viral load <20. He is struggling with new issues related to his cancer and new concern for a mass/growth in his pelvic area.  It had been suggested that he would need radiation but none offered at this time.  He is evaluating his options and looking at getting a second opinion.   For his HIV there have been no new issues.  He takes his medications daily and no complaints.     Review of Systems  Constitutional: Positive for unexpected weight change. Negative for fatigue.  Gastrointestinal: Negative for nausea.  Skin: Negative for rash.       Objective:   Physical Exam Constitutional:      Appearance: He is well-developed.  Cardiovascular:     Rate and Rhythm: Normal rate.  Pulmonary:     Effort: Pulmonary effort is normal. No respiratory distress.  Skin:    Findings: No rash.  Neurological:     General: No focal deficit present.     Mental Status: He is alert.  Psychiatric:        Mood and Affect: Mood normal.    SH: former smoker       Assessment & Plan:

## 2020-03-07 NOTE — Assessment & Plan Note (Signed)
He has been more depressed with his new and ongoing issues with the colon cancer and I will refer him to our counselor.  He is in agreement.

## 2020-03-07 NOTE — Assessment & Plan Note (Signed)
He is doing well with is medications and labs reassuring.  No changes and rtc in 6 months.

## 2020-03-08 ENCOUNTER — Telehealth: Payer: Self-pay | Admitting: Family

## 2020-03-08 NOTE — Telephone Encounter (Signed)
Left detailed message on patient's personal voicemail regarding valcyclovir and questions concerned whether he had a current out break or if this was more persistent nerve pain. We may need to consult with I&D Dr. Linus Salmons who already sees patient and discuss different treatment if he continues to have outbreaks. May also need to consider adjusting postherpetic neuralgia pain medication if more related to persistent pain.  I advised him to contact via mychart since our office closes at 5 pm.    Will await patient message.

## 2020-03-08 NOTE — Telephone Encounter (Signed)
I left a message with call back number to discuss plan for valacyclovir.

## 2020-03-12 ENCOUNTER — Telehealth: Payer: Self-pay | Admitting: Family

## 2020-03-12 ENCOUNTER — Other Ambulatory Visit: Payer: Self-pay | Admitting: Family

## 2020-03-12 NOTE — Telephone Encounter (Signed)
Left voicemail with call back number to discuss Valacyclovir dosing and current symptoms of post herpetic neuralgia vs. recurrent Shingles.

## 2020-03-13 ENCOUNTER — Other Ambulatory Visit: Payer: Self-pay | Admitting: Hematology & Oncology

## 2020-03-13 ENCOUNTER — Encounter: Payer: Self-pay | Admitting: *Deleted

## 2020-03-14 ENCOUNTER — Telehealth: Payer: Self-pay | Admitting: Family

## 2020-03-14 NOTE — Telephone Encounter (Signed)
I was able to speak with Todd Vincent yesterday over the phone. He has gone back to taking the Valcyclovir once daily as prescribed. He denies having an outbreak but does have issues with nerve pain down his side and into his leg every few days. He is currently taking Lyrica and does not feel it needs to be changed at this time.   He is having difficulty urinating and has an appointment coming up with urology for CT scan as well as a catherter cam procedure to assess for blockage.   No needs at this time. Patient appreciative of call.

## 2020-03-25 ENCOUNTER — Inpatient Hospital Stay: Payer: Medicare Other | Attending: Hematology & Oncology

## 2020-03-25 ENCOUNTER — Inpatient Hospital Stay: Payer: Medicare Other | Admitting: Hematology & Oncology

## 2020-03-25 ENCOUNTER — Inpatient Hospital Stay: Payer: Medicare Other

## 2020-04-02 ENCOUNTER — Other Ambulatory Visit: Payer: Self-pay | Admitting: *Deleted

## 2020-04-02 DIAGNOSIS — B029 Zoster without complications: Secondary | ICD-10-CM

## 2020-04-02 DIAGNOSIS — C469 Kaposi's sarcoma, unspecified: Secondary | ICD-10-CM

## 2020-04-02 MED ORDER — PREGABALIN 75 MG PO CAPS: 75.0000 mg | ORAL_CAPSULE | Freq: Two times a day (BID) | ORAL | 3 refills | Status: DC

## 2020-04-08 ENCOUNTER — Telehealth: Payer: Self-pay

## 2020-04-08 NOTE — Telephone Encounter (Signed)
S/W pt to r/s his missed 11/8 appts, done,.......Marland KitchenAOM

## 2020-04-10 ENCOUNTER — Telehealth: Payer: Self-pay | Admitting: Family

## 2020-04-10 ENCOUNTER — Telehealth: Payer: Self-pay | Admitting: Hematology & Oncology

## 2020-04-10 NOTE — Telephone Encounter (Signed)
Release: [phone] Faxed records to [facility] @ [facility]

## 2020-04-10 NOTE — Telephone Encounter (Signed)
Faxed medical records to: [facility]: [employer]

## 2020-04-25 ENCOUNTER — Inpatient Hospital Stay: Payer: Medicare Other

## 2020-04-25 ENCOUNTER — Other Ambulatory Visit: Payer: Self-pay | Admitting: Internal Medicine

## 2020-04-25 ENCOUNTER — Inpatient Hospital Stay: Payer: Medicare Other | Attending: Hematology & Oncology

## 2020-04-25 ENCOUNTER — Other Ambulatory Visit: Payer: Self-pay

## 2020-04-25 ENCOUNTER — Encounter: Payer: Self-pay | Admitting: Family

## 2020-04-25 ENCOUNTER — Inpatient Hospital Stay (HOSPITAL_BASED_OUTPATIENT_CLINIC_OR_DEPARTMENT_OTHER): Payer: Medicare Other | Admitting: Family

## 2020-04-25 VITALS — BP 104/63 | HR 95 | Temp 98.9°F | Resp 18 | Ht 67.0 in | Wt 142.8 lb

## 2020-04-25 DIAGNOSIS — C20 Malignant neoplasm of rectum: Secondary | ICD-10-CM | POA: Insufficient documentation

## 2020-04-25 DIAGNOSIS — R5383 Other fatigue: Secondary | ICD-10-CM | POA: Diagnosis not present

## 2020-04-25 DIAGNOSIS — D509 Iron deficiency anemia, unspecified: Secondary | ICD-10-CM | POA: Diagnosis not present

## 2020-04-25 DIAGNOSIS — C469 Kaposi's sarcoma, unspecified: Secondary | ICD-10-CM | POA: Insufficient documentation

## 2020-04-25 DIAGNOSIS — B20 Human immunodeficiency virus [HIV] disease: Secondary | ICD-10-CM | POA: Diagnosis not present

## 2020-04-25 DIAGNOSIS — Z5111 Encounter for antineoplastic chemotherapy: Secondary | ICD-10-CM | POA: Diagnosis not present

## 2020-04-25 DIAGNOSIS — K6289 Other specified diseases of anus and rectum: Secondary | ICD-10-CM

## 2020-04-25 LAB — CBC WITH DIFFERENTIAL (CANCER CENTER ONLY)
Abs Immature Granulocytes: 0.05 10*3/uL (ref 0.00–0.07)
Basophils Absolute: 0 10*3/uL (ref 0.0–0.1)
Basophils Relative: 0 %
Eosinophils Absolute: 0.2 10*3/uL (ref 0.0–0.5)
Eosinophils Relative: 2 %
HCT: 31.9 % — ABNORMAL LOW (ref 39.0–52.0)
Hemoglobin: 10.4 g/dL — ABNORMAL LOW (ref 13.0–17.0)
Immature Granulocytes: 1 %
Lymphocytes Relative: 27 %
Lymphs Abs: 2.8 10*3/uL (ref 0.7–4.0)
MCH: 28.7 pg (ref 26.0–34.0)
MCHC: 32.6 g/dL (ref 30.0–36.0)
MCV: 87.9 fL (ref 80.0–100.0)
Monocytes Absolute: 0.8 10*3/uL (ref 0.1–1.0)
Monocytes Relative: 7 %
Neutro Abs: 6.4 10*3/uL (ref 1.7–7.7)
Neutrophils Relative %: 63 %
Platelet Count: 535 10*3/uL — ABNORMAL HIGH (ref 150–400)
RBC: 3.63 MIL/uL — ABNORMAL LOW (ref 4.22–5.81)
RDW: 14.7 % (ref 11.5–15.5)
WBC Count: 10.3 10*3/uL (ref 4.0–10.5)
nRBC: 0 % (ref 0.0–0.2)

## 2020-04-25 LAB — CMP (CANCER CENTER ONLY)
ALT: 7 U/L (ref 0–44)
AST: 8 U/L — ABNORMAL LOW (ref 15–41)
Albumin: 3.3 g/dL — ABNORMAL LOW (ref 3.5–5.0)
Alkaline Phosphatase: 62 U/L (ref 38–126)
Anion gap: 6 (ref 5–15)
BUN: 6 mg/dL (ref 6–20)
CO2: 30 mmol/L (ref 22–32)
Calcium: 9.5 mg/dL (ref 8.9–10.3)
Chloride: 101 mmol/L (ref 98–111)
Creatinine: 0.8 mg/dL (ref 0.61–1.24)
GFR, Estimated: >60 mL/min (ref >60)
Glucose, Bld: 84 mg/dL (ref 70–99)
Potassium: 3.7 mmol/L (ref 3.5–5.1)
Sodium: 137 mmol/L (ref 135–145)
Total Bilirubin: 0.3 mg/dL (ref 0.3–1.2)
Total Protein: 6.8 g/dL (ref 6.5–8.1)

## 2020-04-25 LAB — LACTATE DEHYDROGENASE: LDH: 116 U/L (ref 98–192)

## 2020-04-25 MED ORDER — GENVOYA 150-150-200-10 MG PO TABS: 1.0000 | ORAL_TABLET | Freq: Every day | ORAL | 1 refills | Status: AC

## 2020-04-25 MED ORDER — SODIUM CHLORIDE 0.9 % IJ SOLN
10.0000 mL | INTRAMUSCULAR | Status: DC | PRN
  Filled 2020-04-25: qty 10

## 2020-04-25 MED ORDER — DARUNAVIR ETHANOLATE 800 MG PO TABS: ORAL_TABLET | ORAL | 1 refills | Status: AC

## 2020-04-25 MED ORDER — ONDANSETRON HCL 8 MG PO TABS
ORAL_TABLET | ORAL | Status: AC
  Filled 2020-04-25: qty 1

## 2020-04-25 MED ORDER — DOXORUBICIN HCL LIPOSOMAL CHEMO INJECTION 2 MG/ML
20.0000 mg/m2 | Freq: Once | INTRAVENOUS | Status: AC
  Administered 2020-04-25: 36 mg via INTRAVENOUS
  Filled 2020-04-25: qty 18

## 2020-04-25 MED ORDER — ONDANSETRON HCL 8 MG PO TABS
8.0000 mg | ORAL_TABLET | Freq: Once | ORAL | Status: AC
  Administered 2020-04-25: 8 mg via ORAL

## 2020-04-25 MED ORDER — DEXTROSE 5 % IV SOLN
Freq: Once | INTRAVENOUS | Status: AC
  Filled 2020-04-25: qty 250

## 2020-04-25 MED ORDER — SODIUM CHLORIDE 0.9 % IV SOLN
10.0000 mg | Freq: Once | INTRAVENOUS | Status: AC
  Administered 2020-04-25: 10 mg via INTRAVENOUS
  Filled 2020-04-25: qty 10

## 2020-04-25 NOTE — Progress Notes (Signed)
Pt discharged in no apparent distress. Pt left ambulatory without assistance. Pt aware of discharge instructions and verbalized understanding and had no further questions.  

## 2020-04-25 NOTE — Progress Notes (Signed)
Hematology and Oncology Follow Up Visit  Todd Vincent [id number] 06/24/1975 44 y.o. 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 He decided to go to the [facility] in [place]. He states that they do not feel he needs surgery and that they can successfully treat his tumor with 4-5 weeks of radiation. He is so happy with how his visit went and his plan of care.  He will be having a PET scan and a virtual full body scan next week on Monday. He notes fatigue at times.  No fever, chills, n/v, cough, rash, dizziness, SOB, chest pain, palpitations, abdominal pain or changes in bowel or bladder habits.  He still has a little but of trouble emptying his bladder due to weak stream. He had a cystoscopy with urology which was negative. He states that they feel the mass in his anus is the cause.  His ostomy has been functioning well.  No blood loss noted. No bruising or petechiae.   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  No falls or syncope to report.  The swelling in his lower extremities has remained stable. He notes that it improves over night and with elevating his feet.  He has seen a nutritionist and notes that his appetite has been better. He has cut out red meat, pork and processed foods. He is eating high protein and feels that he is staying well hydrated. His weight is up 3 lbs since his last visit.  He does cardio work outs for exercise.   ECOG Performance Status: 1 - Symptomatic but completely ambulatory  Medications:  Allergies as of 04/25/2020      Reactions   Sulfa Antibiotics Rash, Itching    Extreme itching    Sulfasalazine Itching, Rash      Medication List       Accurate as of April 25, 2020  1:18 PM. If you have any questions, ask your nurse or doctor.        ALPRAZolam 0.5 MG tablet Commonly known as: XANAX TAKE 1 TABLET(0.5 MG) BY MOUTH AT BEDTIME AS NEEDED FOR ANXIETY   citalopram 10 MG tablet Commonly known as: CELEXA Take 1 tablet by mouth daily.   dronabinol 5 MG capsule Commonly known as: MARINOL TAKE 1 CAPSULE BY MOUTH TWICE DAILY BEFORE LUNCH AND SUPPER   Genvoya 150-150-200-10 MG Tabs tablet Generic drug: elvitegravir-cobicistat-emtricitabine-tenofovir TAKE 1 TABLET BY MOUTH DAILY   LORazepam 0.5 MG tablet Commonly known as: ATIVAN Take 1 tablet (0.5 mg total) by mouth every 6 (six) hours as needed for anxiety.   meclizine 25 MG tablet Commonly known as: ANTIVERT Take 1 tablet (25 mg total) by mouth every 6 (six) hours as needed for dizziness.   ondansetron 8 MG tablet Commonly known as: ZOFRAN Take 1 tablet (8 mg total) by mouth 2 (two) times daily. Start the day after chemo for 2 days. Then take as needed for nausea or vomiting.   oxyCODONE 5 MG immediate release tablet Commonly known as: Oxy IR/ROXICODONE Take 5 mg by mouth every 6 (six) hours as needed.   pregabalin 75 MG capsule Commonly known as: LYRICA Take 1 capsule (75 mg total) by  mouth 2 (two) times daily.   Prezista 800 MG tablet Generic drug: darunavir TAKE 1 TABLET(800 MG) BY MOUTH DAILY WITH BREAKFAST   traMADol 50 MG tablet Commonly known as: Ultram Take 1 tablet (50 mg total) by mouth every 8 (eight) hours as needed.   triamterene-hydrochlorothiazide 37.5-25 MG tablet Commonly known as: MAXZIDE-25 TAKE 1 TABLET BY MOUTH EVERY DAY   valACYclovir 1000 MG tablet Commonly known as: VALTREX Take 1 tablet (1,000 mg total) by mouth daily.       Allergies:  Allergies  Allergen Reactions  . Sulfa Antibiotics Rash and Itching    Extreme itching   . Sulfasalazine  Itching and Rash    Past Medical History, Surgical history, Social history, and Family History were reviewed and updated.  Review of Systems: All other 10 point review of systems is negative.   Physical Exam:  height is 5\' 7"  (1.702 m) and weight is 142 lb 12.8 oz (64.8 kg). His oral temperature is 98.9 F (37.2 C). His blood pressure is 104/63 and his pulse is 95. His respiration is 18 and oxygen saturation is 100%.   Wt Readings from Last 3 Encounters:  04/25/20 142 lb 12.8 oz (64.8 kg)  03/07/20 139 lb (63 kg)  02/22/20 141 lb 1.3 oz (64 kg)    Ocular: Sclerae unicteric, pupils equal, round and reactive to light Ear-nose-throat: Oropharynx clear, dentition fair Lymphatic: No cervical or supraclavicular adenopathy Lungs no rales or rhonchi, good excursion bilaterally Heart regular rate and rhythm, no murmur appreciated Abd soft, nontender, positive bowel sounds MSK no focal spinal tenderness, no joint edema Neuro: non-focal, well-oriented, appropriate affect Breasts: Deferred   Lab Results  Component Value Date   WBC 10.3 04/25/2020   HGB 10.4 (L) 04/25/2020   HCT 31.9 (L) 04/25/2020   MCV 87.9 04/25/2020   PLT 535 (H) 04/25/2020   Lab Results  Component Value Date   FERRITIN 42 03/28/2019   IRON 81 03/28/2019   TIBC 301 03/28/2019   UIBC 220 03/28/2019   IRONPCTSAT 27 03/28/2019   Lab Results  Component Value Date   RBC 3.63 (L) 04/25/2020   No results found for: KPAFRELGTCHN, LAMBDASER, KAPLAMBRATIO No results found for: IGGSERUM, IGA, IGMSERUM No results found for: Kathrynn Ducking, MSPIKE, SPEI   Chemistry      Component Value Date/Time   NA 135 02/22/2020 0934   NA 141 04/19/2017 1007   NA 141 12/13/2015 0827   K 3.5 02/22/2020 0934   K 3.4 04/19/2017 1007   K 4.0 12/13/2015 0827   CL 94 (L) 02/22/2020 0934   CL 105 04/19/2017 1007   CO2 30 02/22/2020 0934   CO2 27 04/19/2017 1007   CO2 26 12/13/2015 0827    BUN 8 02/22/2020 0934   BUN 8 04/19/2017 1007   BUN 8.2 12/13/2015 0827   CREATININE 1.08 02/22/2020 0934   CREATININE 1.09 08/07/2019 0928   CREATININE 1.2 12/13/2015 0827      Component Value Date/Time   CALCIUM 10.4 (H) 02/22/2020 0934   CALCIUM 9.3 04/19/2017 1007   CALCIUM 9.2 12/13/2015 0827   ALKPHOS 71 02/22/2020 0934   ALKPHOS 82 04/19/2017 1007   ALKPHOS 78 12/13/2015 0827   AST 11 (L) 02/22/2020 0934   AST 14 12/13/2015 0827   ALT 12 02/22/2020 0934   ALT 23 04/19/2017 1007   ALT 13 12/13/2015 0827   BILITOT 0.3 02/22/2020 0934   BILITOT 0.40 12/13/2015 0827  Impression and Plan: Todd Vincent is a very pleasant 44yo African American gentleman with Kaposi's sarcoma and is now waiting to start treatment for recurrent rectal cancer with [facility].  He is feeling much better and has no complaints at this time.  We will proceed with treatment today as planned. We will hold after today until he has completed radiation to avoid increased radiation sensitivity. Iron studies are pending. His Hgb is down at 10.4 and platelets 535. We will get him set up for IV iron if needed.  Follow-up in February once he completes radiation therapy.  He was encouraged to contact our office with any questions or concerns.   Laverna Peace, NP 12/9/20211:18 PM

## 2020-04-25 NOTE — Patient Instructions (Signed)
Doxorubicin Liposomal injection What is this medicine? LIPOSOMAL DOXORUBICIN (LIP oh som al dox oh ROO bi sin) is a chemotherapy drug. This medicine is used to treat many kinds of cancer like Kaposi's sarcoma, multiple myeloma, and ovarian cancer. This medicine may be used for other purposes; ask your health care provider or pharmacist if you have questions. COMMON BRAND NAME(S): Doxil, Lipodox What should I tell my health care provider before I take this medicine? They need to know if you have any of these conditions:  blood disorders  heart disease  infection (especially a virus infection such as chickenpox, cold sores, or herpes)  liver disease  recent or ongoing radiation therapy  an unusual or allergic reaction to doxorubicin, other chemotherapy agents, soybeans, other medicines, foods, dyes, or preservatives  pregnant or trying to get pregnant  breast-feeding How should I use this medicine? This drug is given as an infusion into a vein. It is administered in a hospital or clinic by a specially trained health care professional. If you have pain, swelling, burning or any unusual feeling around the site of your injection, tell your health care professional right away. Talk to your pediatrician regarding the use of this medicine in children. Special care may be needed. Overdosage: If you think you have taken too much of this medicine contact a poison control center or emergency room at once. NOTE: This medicine is only for you. Do not share this medicine with others. What if I miss a dose? It is important not to miss your dose. Call your doctor or health care professional if you are unable to keep an appointment. What may interact with this medicine? Do not take this medicine with any of the following medications:  zidovudine This medicine may also interact with the following medications:  medicines to increase blood counts like filgrastim, pegfilgrastim,  sargramostim  vaccines Talk to your doctor or health care professional before taking any of these medicines:  acetaminophen  aspirin  ibuprofen  ketoprofen  naproxen This list may not describe all possible interactions. Give your health care provider a list of all the medicines, herbs, non-prescription drugs, or dietary supplements you use. Also tell them if you smoke, drink alcohol, or use illegal drugs. Some items may interact with your medicine. What should I watch for while using this medicine? Your condition will be monitored carefully while you are receiving this medicine. You may need blood work done while you are taking this medicine. This drug may make you feel generally unwell. This is not uncommon, as chemotherapy can affect healthy cells as well as cancer cells. Report any side effects. Continue your course of treatment even though you feel ill unless your doctor tells you to stop. Your urine may turn orange-red for a few days after your dose. This is not blood. If your urine is dark or brown, call your doctor. In some cases, you may be given additional medicines to help with side effects. Follow all directions for their use. Talk to your doctor about your risk of cancer. You may be more at risk for certain types of cancers if you take this medicine. Do not become pregnant while taking this medicine or for 6 months after stopping it. Women should inform their healthcare professional if they wish to become pregnant or think they may be pregnant. Men should not father a child while taking this medicine and for 6 months after stopping it. There is a potential for serious side effects to an unborn child.   Talk to your health care professional or pharmacist for more information. Do not breast-feed an infant while taking this medicine. This medicine has caused ovarian failure in some women. This medicine may make it more difficult to get pregnant. Talk to your healthcare professional if  you are concerned about your fertility. This medicine has caused decreased sperm counts in some men. This may make it more difficult to father a child. Talk to your healthcare professional if you are concerned about your fertility. This medicine may cause a decrease in Co-Enzyme Q-10. You should make sure that you get enough Co-Enzyme Q-10 while you are taking this medicine. Discuss the foods you eat and the vitamins you take with your health care professional. What side effects may I notice from receiving this medicine? Side effects that you should report to your doctor or health care professional as soon as possible:  allergic reactions like skin rash, itching or hives, swelling of the face, lips, or tongue  low blood counts - this medicine may decrease the number of white blood cells, red blood cells and platelets. You may be at increased risk for infections and bleeding.  signs of hand-foot syndrome - tingling or burning, redness, flaking, swelling, small blisters, or small sores on the palms of your hands or the soles of your feet  signs of infection - fever or chills, cough, sore throat, pain or difficulty passing urine  signs of decreased platelets or bleeding - bruising, pinpoint red spots on the skin, black, tarry stools, blood in the urine  signs of decreased red blood cells - unusually weak or tired, fainting spells, lightheadedness  back pain, chills, facial flushing, fever, headache, tightness in the chest or throat during the infusion  breathing problems  chest pain  fast, irregular heartbeat  mouth pain, redness, sores  pain, swelling, redness at site where injected  pain, tingling, numbness in the hands or feet  swelling of ankles, feet, or hands  vomiting Side effects that usually do not require medical attention (report to your doctor or health care professional if they continue or are bothersome):  diarrhea  hair loss  loss of appetite  nail discoloration  or damage  nausea  red or watery eyes  red colored urine  stomach upset This list may not describe all possible side effects. Call your doctor for medical advice about side effects. You may report side effects to FDA at 1-800-FDA-1088. Where should I keep my medicine? This drug is given in a hospital or clinic and will not be stored at home. NOTE: This sheet is a summary. It may not cover all possible information. If you have questions about this medicine, talk to your doctor, pharmacist, or health care provider.  2020 Elsevier/Gold Standard (2018-01-10 15:13:26)  

## 2020-04-26 LAB — IRON AND TIBC
Iron: 13 ug/dL — ABNORMAL LOW (ref 42–163)
Saturation Ratios: 7 % — ABNORMAL LOW (ref 20–55)
TIBC: 186 ug/dL — ABNORMAL LOW (ref 202–409)
UIBC: 173 ug/dL (ref 117–376)

## 2020-04-26 LAB — FERRITIN: Ferritin: 231 ng/mL (ref 24–336)

## 2020-04-29 ENCOUNTER — Other Ambulatory Visit: Payer: Self-pay | Admitting: Family

## 2020-04-29 ENCOUNTER — Telehealth: Payer: Self-pay | Admitting: Family

## 2020-04-29 DIAGNOSIS — D509 Iron deficiency anemia, unspecified: Secondary | ICD-10-CM | POA: Insufficient documentation

## 2020-04-29 NOTE — Telephone Encounter (Signed)
Called and spoke with patient regarding iron infusion appointments that have been scheduled for 12/21 & 12/28 / per 12/13 sch msg

## 2020-05-07 ENCOUNTER — Inpatient Hospital Stay: Payer: Medicare Other

## 2020-05-14 ENCOUNTER — Inpatient Hospital Stay: Payer: Medicare Other

## 2020-06-06 ENCOUNTER — Encounter: Payer: Self-pay | Admitting: Family

## 2020-06-26 ENCOUNTER — Ambulatory Visit: Payer: Medicare Other | Admitting: Hematology & Oncology

## 2020-06-26 ENCOUNTER — Other Ambulatory Visit: Payer: Medicare Other

## 2020-07-25 ENCOUNTER — Telehealth: Payer: Self-pay

## 2020-07-25 NOTE — Telephone Encounter (Signed)
Pt called in to r/s his appt to first of April due to being three hrs away and gas prices   Dalena Plantz

## 2020-07-26 ENCOUNTER — Inpatient Hospital Stay: Payer: Medicare Other

## 2020-07-26 ENCOUNTER — Inpatient Hospital Stay: Payer: Medicare Other | Admitting: Hematology & Oncology

## 2020-08-07 ENCOUNTER — Other Ambulatory Visit: Payer: Self-pay | Admitting: *Deleted

## 2020-08-07 ENCOUNTER — Encounter: Payer: Self-pay | Admitting: Family

## 2020-08-07 DIAGNOSIS — B029 Zoster without complications: Secondary | ICD-10-CM

## 2020-08-07 DIAGNOSIS — C469 Kaposi's sarcoma, unspecified: Secondary | ICD-10-CM

## 2020-08-07 MED ORDER — PREGABALIN 75 MG PO CAPS: 75.0000 mg | ORAL_CAPSULE | Freq: Two times a day (BID) | ORAL | 3 refills | Status: AC

## 2020-08-07 MED ORDER — DRONABINOL 5 MG PO CAPS: ORAL_CAPSULE | ORAL | 0 refills | Status: AC

## 2020-08-21 ENCOUNTER — Inpatient Hospital Stay: Payer: Medicare Other | Attending: Hematology & Oncology

## 2020-08-21 ENCOUNTER — Inpatient Hospital Stay (HOSPITAL_BASED_OUTPATIENT_CLINIC_OR_DEPARTMENT_OTHER): Payer: Medicare Other | Admitting: Hematology & Oncology

## 2020-08-21 ENCOUNTER — Other Ambulatory Visit: Payer: Self-pay

## 2020-08-21 ENCOUNTER — Encounter: Payer: Self-pay | Admitting: Hematology & Oncology

## 2020-08-21 ENCOUNTER — Ambulatory Visit (HOSPITAL_BASED_OUTPATIENT_CLINIC_OR_DEPARTMENT_OTHER)
Admission: RE | Admit: 2020-08-21 | Discharge: 2020-08-21 | Disposition: A | Discharge status: Home / Self Care | Payer: Medicare Other | Source: Ambulatory Visit | Attending: Hematology & Oncology | Admitting: Hematology & Oncology

## 2020-08-21 VITALS — BP 127/80 | HR 138 | Temp 98.5°F | Resp 20 | Wt 122.8 lb

## 2020-08-21 DIAGNOSIS — C469 Kaposi's sarcoma, unspecified: Secondary | ICD-10-CM

## 2020-08-21 DIAGNOSIS — D509 Iron deficiency anemia, unspecified: Secondary | ICD-10-CM | POA: Insufficient documentation

## 2020-08-21 DIAGNOSIS — R2242 Localized swelling, mass and lump, left lower limb: Secondary | ICD-10-CM | POA: Insufficient documentation

## 2020-08-21 DIAGNOSIS — C20 Malignant neoplasm of rectum: Secondary | ICD-10-CM | POA: Insufficient documentation

## 2020-08-21 LAB — CMP (CANCER CENTER ONLY)
ALT: 10 U/L (ref 0–44)
AST: 9 U/L — ABNORMAL LOW (ref 15–41)
Albumin: 3.2 g/dL — ABNORMAL LOW (ref 3.5–5.0)
Alkaline Phosphatase: 93 U/L (ref 38–126)
Anion gap: 9 (ref 5–15)
BUN: 7 mg/dL (ref 6–20)
CO2: 29 mmol/L (ref 22–32)
Calcium: 9.5 mg/dL (ref 8.9–10.3)
Chloride: 96 mmol/L — ABNORMAL LOW (ref 98–111)
Creatinine: 0.73 mg/dL (ref 0.61–1.24)
GFR, Estimated: >60 mL/min (ref >60)
Glucose, Bld: 143 mg/dL — ABNORMAL HIGH (ref 70–99)
Potassium: 3.3 mmol/L — ABNORMAL LOW (ref 3.5–5.1)
Sodium: 134 mmol/L — ABNORMAL LOW (ref 135–145)
Total Bilirubin: 0.4 mg/dL (ref 0.3–1.2)
Total Protein: 7 g/dL (ref 6.5–8.1)

## 2020-08-21 LAB — CBC WITH DIFFERENTIAL (CANCER CENTER ONLY)
Abs Immature Granulocytes: 0.27 10*3/uL — ABNORMAL HIGH (ref 0.00–0.07)
Basophils Absolute: 0 10*3/uL (ref 0.0–0.1)
Basophils Relative: 0 %
Eosinophils Absolute: 0.2 10*3/uL (ref 0.0–0.5)
Eosinophils Relative: 1 %
HCT: 29.2 % — ABNORMAL LOW (ref 39.0–52.0)
Hemoglobin: 9.7 g/dL — ABNORMAL LOW (ref 13.0–17.0)
Immature Granulocytes: 1 %
Lymphocytes Relative: 10 %
Lymphs Abs: 1.9 10*3/uL (ref 0.7–4.0)
MCH: 30.3 pg (ref 26.0–34.0)
MCHC: 33.2 g/dL (ref 30.0–36.0)
MCV: 91.3 fL (ref 80.0–100.0)
Monocytes Absolute: 1.4 10*3/uL — ABNORMAL HIGH (ref 0.1–1.0)
Monocytes Relative: 7 %
Neutro Abs: 15 10*3/uL — ABNORMAL HIGH (ref 1.7–7.7)
Neutrophils Relative %: 81 %
Platelet Count: 531 10*3/uL — ABNORMAL HIGH (ref 150–400)
RBC: 3.2 MIL/uL — ABNORMAL LOW (ref 4.22–5.81)
RDW: 18.6 % — ABNORMAL HIGH (ref 11.5–15.5)
WBC Count: 18.7 10*3/uL — ABNORMAL HIGH (ref 4.0–10.5)
nRBC: 0 % (ref 0.0–0.2)

## 2020-08-21 LAB — LACTATE DEHYDROGENASE: LDH: 134 U/L (ref 98–192)

## 2020-08-21 NOTE — Progress Notes (Signed)
Hematology and Oncology Follow Up Visit  Todd Vincent [id number] 1975-09-30 45 y.o. 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  He actually has been getting treatment for his rectal cancer down in [place].  He is at the [facility].  He had radiation therapy and infusional 5-FU.  Sound like he does had 5-FU the first week in last week of radiation.  He seemed to tolerate this pretty well.  The problem is that he is having a lot of drainage in the rectal area.  I am not sure when he goes back to see the doctors in [place].  He has a colostomy.  This seems to be working for his bowels.  He has a lot of swelling in his legs.  I am not sure  as to what is going on with the swelling.  His albumin is not that bad.  It is 3.2.  He is a little bit anemic.  His hemoglobin is 9.7.  He does have an elevated white cell count of 18.7.  I am not sure this might be from this drainage.  We did go ahead and do Dopplers of his legs.  The Dopplers were negative for any thromboembolic disease.  As far as the Kaposi's sarcomas concerned, he has had no treatment for quite a while.  Again has been treated for the rectal cancer.  He said that his doctors do not want him started on Doxil until after he is seen in early May.  Thus when he is going to have a PET scan done.  Of note, his last Doxil was given on 04/25/2020  He says he is noted a couple new lesions on his arm.  I think this is his right forearm.  He has had no bleeding.  He has had no mouth sores.  He has had no swollen  lymph nodes.  He has lost quite a bit of weight.  He is try to get his weight back.  He says he is eating okay.  He says he is hungry.  I would think that the weight should come back.  I would have to say that his performance status right now is by ECOG 2.  Medications:  Allergies as of 08/21/2020      Reactions   Sulfa Antibiotics Rash, Itching   Extreme itching    Sulfasalazine Itching, Rash      Medication List       Accurate as of August 21, 2020  2:35 PM. If you have any questions, ask your nurse or doctor.        STOP taking these medications   citalopram 10 MG tablet Commonly known as: CELEXA Stopped by: [name], MD     TAKE these medications   ALPRAZolam 0.5 MG tablet Commonly known as: XANAX TAKE 1 TABLET(0.5 MG) BY MOUTH AT BEDTIME AS NEEDED FOR ANXIETY   darunavir 800 MG tablet Commonly known as: Prezista TAKE 1 TABLET(800 MG) BY MOUTH DAILY WITH BREAKFAST   dronabinol 5 MG capsule Commonly known as: MARINOL TAKE 1 CAPSULE BY MOUTH TWICE DAILY BEFORE LUNCH AND SUPPER   Genvoya 150-150-200-10 MG Tabs  tablet Generic drug: elvitegravir-cobicistat-emtricitabine-tenofovir Take 1 tablet by mouth daily.   LORazepam 0.5 MG tablet Commonly known as: ATIVAN Take 1 tablet (0.5 mg total) by mouth every 6 (six) hours as needed for anxiety.   meclizine 25 MG tablet Commonly known as: ANTIVERT Take 1 tablet (25 mg total) by mouth every 6 (six) hours as needed for dizziness.   ondansetron 8 MG tablet Commonly known as: ZOFRAN Take 1 tablet (8 mg total) by mouth 2 (two) times daily. Start the day after chemo for 2 days. Then take as needed for nausea or vomiting.   oxyCODONE 5 MG immediate release tablet Commonly known as: Oxy IR/ROXICODONE Take 5 mg by mouth every 6 (six) hours as needed.   pregabalin 75 MG capsule Commonly known as: LYRICA Take 1 capsule (75 mg total) by mouth 2 (two) times daily.   triamterene-hydrochlorothiazide 37.5-25 MG  tablet Commonly known as: MAXZIDE-25 TAKE 1 TABLET BY MOUTH EVERY DAY   valACYclovir 1000 MG tablet Commonly known as: VALTREX Take 1 tablet (1,000 mg total) by mouth daily.       Allergies:  Allergies  Allergen Reactions  . Sulfa Antibiotics Rash and Itching    Extreme itching   . Sulfasalazine Itching and Rash    Past Medical History, Surgical history, Social history, and Family History were reviewed and updated.  Review of Systems: Review of Systems  Constitutional: Positive for malaise/fatigue and weight loss.  HENT: Negative.   Eyes: Negative.   Respiratory: Negative.   Cardiovascular: Negative.   Gastrointestinal: Positive for blood in stool.  Genitourinary: Negative.   Musculoskeletal: Positive for myalgias.  Skin: Negative.   Neurological: Negative.   Endo/Heme/Allergies: Negative.   Psychiatric/Behavioral: Negative.      Physical Exam:  weight is 122 lb 12.8 oz (55.7 kg). His oral temperature is 98.5 F (36.9 C). His blood pressure is 127/80 and his pulse is 138 (abnormal). His respiration is 20 and oxygen saturation is 98%.   Wt Readings from Last 3 Encounters:  08/21/20 122 lb 12.8 oz (55.7 kg)  04/25/20 142 lb 12.8 oz (64.8 kg)  03/07/20 139 lb (63 kg)    Physical Exam Vitals reviewed.  HENT:     Head: Normocephalic and atraumatic.  Eyes:     Pupils: Pupils are equal, round, and reactive to light.  Cardiovascular:     Rate and Rhythm: Normal rate and regular rhythm.     Heart sounds: Normal heart sounds.  Pulmonary:     Effort: Pulmonary effort is normal.     Breath sounds: Normal breath sounds.  Abdominal:     General: Bowel sounds are normal.     Palpations: Abdomen is soft.     Comments: Abdominal exam shows a colostomy in the right lower quadrant.  He has decent bowel sounds.  There is no abdominal swelling.  He has no fluid wave.  There is no guarding or rebound tenderness.  I cannot palpate his liver or spleen.  Genitourinary:     Comments: He has a packing up in the anal area.  There is a foul smelling odor emanating from this area. Musculoskeletal:        General: No tenderness or deformity. Normal range of motion.     Cervical back: Normal range of motion.  Lymphadenopathy:     Cervical: No cervical adenopathy.  Skin:    General: Skin is warm and dry.     Findings: No erythema or rash.  Neurological:  Mental Status: He is alert and oriented to person, place, and time.  Psychiatric:        Behavior: Behavior normal.        Thought Content: Thought content normal.        Judgment: Judgment normal.    Lab Results  Component Value Date   WBC 18.7 (H) 08/21/2020   HGB 9.7 (L) 08/21/2020   HCT 29.2 (L) 08/21/2020   MCV 91.3 08/21/2020   PLT 531 (H) 08/21/2020   Lab Results  Component Value Date   FERRITIN 231 04/25/2020   IRON 13 (L) 04/25/2020   TIBC 186 (L) 04/25/2020   UIBC 173 04/25/2020   IRONPCTSAT 7 (L) 04/25/2020   Lab Results  Component Value Date   RBC 3.20 (L) 08/21/2020   No results found for: KPAFRELGTCHN, LAMBDASER, KAPLAMBRATIO No results found for: IGGSERUM, IGA, IGMSERUM No results found for: Odetta Pink, SPEI   Chemistry      Component Value Date/Time   NA 137 04/25/2020 1253   NA 141 04/19/2017 1007   NA 141 12/13/2015 0827   K 3.7 04/25/2020 1253   K 3.4 04/19/2017 1007   K 4.0 12/13/2015 0827   CL 101 04/25/2020 1253   CL 105 04/19/2017 1007   CO2 30 04/25/2020 1253   CO2 27 04/19/2017 1007   CO2 26 12/13/2015 0827   BUN 6 04/25/2020 1253   BUN 8 04/19/2017 1007   BUN 8.2 12/13/2015 0827   CREATININE 0.80 04/25/2020 1253   CREATININE 1.09 08/07/2019 0928   CREATININE 1.2 12/13/2015 0827      Component Value Date/Time   CALCIUM 9.5 04/25/2020 1253   CALCIUM 9.3 04/19/2017 1007   CALCIUM 9.2 12/13/2015 0827   ALKPHOS 62 04/25/2020 1253   ALKPHOS 82 04/19/2017 1007   ALKPHOS 78 12/13/2015 0827   AST 8  (L) 04/25/2020 1253   AST 14 12/13/2015 0827   ALT 7 04/25/2020 1253   ALT 23 04/19/2017 1007   ALT 13 12/13/2015 0827   BILITOT 0.3 04/25/2020 1253   BILITOT 0.40 12/13/2015 0827       Impression and Plan: Mr. Mogan is a very pleasant 45yo African American gentleman with a long history of Kaposi's sarcoma has been very well controlled with Doxil.  We have held the Doxil for right now so that his rectal cancer can be managed.  At some point, we will have to get the Doxil restarted.  This is worked incredibly well for him.  I just worry about the rectal cancer and side effects and complications that he is having.  I just worry about something going on with the rectal/anal area.  I do not know if there might be a fistula.  He says he goes back to [facility] to be evaluated.  He will have a PET scan done in early May.  I do worry about the leg swelling.  Maybe, with weight gain and getting his albumin up a little bit higher there might be less swelling in the legs.  I would not think that he would need any CT scans.  Provide know that he has had swelling in the past because of Kaposi's.  He does need to get IV iron.  I will do IV iron on him tomorrow.  I will plan to have him come back to see me after he goes back to [place].    [name], MD 4/6/20222:35 PM

## 2020-08-22 ENCOUNTER — Other Ambulatory Visit: Payer: Self-pay | Admitting: Hematology & Oncology

## 2020-08-22 ENCOUNTER — Inpatient Hospital Stay: Payer: Medicare Other

## 2020-08-22 ENCOUNTER — Ambulatory Visit (INDEPENDENT_AMBULATORY_CARE_PROVIDER_SITE_OTHER): Payer: Medicare Other | Admitting: Podiatry

## 2020-08-22 ENCOUNTER — Encounter: Payer: Self-pay | Admitting: Podiatry

## 2020-08-22 ENCOUNTER — Ambulatory Visit (INDEPENDENT_AMBULATORY_CARE_PROVIDER_SITE_OTHER): Payer: Medicare Other

## 2020-08-22 VITALS — BP 95/50 | HR 107 | Temp 99.2°F | Resp 17

## 2020-08-22 DIAGNOSIS — M79671 Pain in right foot: Secondary | ICD-10-CM

## 2020-08-22 DIAGNOSIS — D509 Iron deficiency anemia, unspecified: Secondary | ICD-10-CM | POA: Diagnosis not present

## 2020-08-22 DIAGNOSIS — M79672 Pain in left foot: Secondary | ICD-10-CM

## 2020-08-22 DIAGNOSIS — K56609 Unspecified intestinal obstruction, unspecified as to partial versus complete obstruction: Secondary | ICD-10-CM

## 2020-08-22 DIAGNOSIS — N289 Disorder of kidney and ureter, unspecified: Secondary | ICD-10-CM

## 2020-08-22 DIAGNOSIS — C469 Kaposi's sarcoma, unspecified: Secondary | ICD-10-CM | POA: Diagnosis not present

## 2020-08-22 DIAGNOSIS — R600 Localized edema: Secondary | ICD-10-CM | POA: Diagnosis not present

## 2020-08-22 DIAGNOSIS — G629 Polyneuropathy, unspecified: Secondary | ICD-10-CM

## 2020-08-22 DIAGNOSIS — C7989 Secondary malignant neoplasm of other specified sites: Secondary | ICD-10-CM

## 2020-08-22 DIAGNOSIS — C775 Secondary and unspecified malignant neoplasm of intrapelvic lymph nodes: Secondary | ICD-10-CM | POA: Insufficient documentation

## 2020-08-22 DIAGNOSIS — C20 Malignant neoplasm of rectum: Secondary | ICD-10-CM | POA: Diagnosis present

## 2020-08-22 HISTORY — DX: Malignant neoplasm of rectum: C20

## 2020-08-22 HISTORY — DX: Secondary and unspecified malignant neoplasm of intrapelvic lymph nodes: C77.5

## 2020-08-22 LAB — IRON AND TIBC
Iron: 21 ug/dL — ABNORMAL LOW (ref 42–163)
Saturation Ratios: 13 % — ABNORMAL LOW (ref 20–55)
TIBC: 161 ug/dL — ABNORMAL LOW (ref 202–409)
UIBC: 140 ug/dL (ref 117–376)

## 2020-08-22 LAB — FERRITIN: Ferritin: 668 ng/mL — ABNORMAL HIGH (ref 24–336)

## 2020-08-22 MED ORDER — HYDROMORPHONE HCL 1 MG/ML IJ SOLN
INTRAMUSCULAR | Status: AC
  Filled 2020-08-22: qty 1

## 2020-08-22 MED ORDER — HYDROMORPHONE HCL 1 MG/ML IJ SOLN
1.0000 mg | Freq: Once | INTRAMUSCULAR | Status: AC
  Administered 2020-08-22: 1 mg via INTRAVENOUS

## 2020-08-22 MED ORDER — FERUMOXYTOL INJECTION 510 MG/17 ML
510.0000 mg | Freq: Once | INTRAVENOUS | Status: AC
  Administered 2020-08-22: 510 mg via INTRAVENOUS
  Filled 2020-08-22: qty 510

## 2020-08-22 MED ORDER — SODIUM CHLORIDE 0.9 % IV SOLN
Freq: Once | INTRAVENOUS | Status: AC
  Filled 2020-08-22: qty 250

## 2020-08-22 NOTE — Patient Instructions (Signed)
Ferumoxytol injection What is this medicine? FERUMOXYTOL is an iron complex. Iron is used to make healthy red blood cells, which carry oxygen and nutrients throughout the body. This medicine is used to treat iron deficiency anemia. This medicine may be used for other purposes; ask your health care provider or pharmacist if you have questions. COMMON BRAND NAME(S): Feraheme What should I tell my health care provider before I take this medicine? They need to know if you have any of these conditions:  anemia not caused by low iron levels  high levels of iron in the blood  magnetic resonance imaging (MRI) test scheduled  an unusual or allergic reaction to iron, other medicines, foods, dyes, or preservatives  pregnant or trying to get pregnant  breast-feeding How should I use this medicine? This medicine is for injection into a vein. It is given by a health care professional in a hospital or clinic setting. Talk to your pediatrician regarding the use of this medicine in children. Special care may be needed. Overdosage: If you think you have taken too much of this medicine contact a poison control center or emergency room at once. NOTE: This medicine is only for you. Do not share this medicine with others. What if I miss a dose? It is important not to miss your dose. Call your doctor or health care professional if you are unable to keep an appointment. What may interact with this medicine? This medicine may interact with the following medications:  other iron products This list may not describe all possible interactions. Give your health care provider a list of all the medicines, herbs, non-prescription drugs, or dietary supplements you use. Also tell them if you smoke, drink alcohol, or use illegal drugs. Some items may interact with your medicine. What should I watch for while using this medicine? Visit your doctor or healthcare professional regularly. Tell your doctor or healthcare  professional if your symptoms do not start to get better or if they get worse. You may need blood work done while you are taking this medicine. You may need to follow a special diet. Talk to your doctor. Foods that contain iron include: whole grains/cereals, dried fruits, beans, or peas, leafy green vegetables, and organ meats (liver, kidney). What side effects may I notice from receiving this medicine? Side effects that you should report to your doctor or health care professional as soon as possible:  allergic reactions like skin rash, itching or hives, swelling of the face, lips, or tongue  breathing problems  changes in blood pressure  feeling faint or lightheaded, falls  fever or chills  flushing, sweating, or hot feelings  swelling of the ankles or feet Side effects that usually do not require medical attention (report to your doctor or health care professional if they continue or are bothersome):  diarrhea  headache  nausea, vomiting  stomach pain This list may not describe all possible side effects. Call your doctor for medical advice about side effects. You may report side effects to FDA at 1-800-FDA-1088. Where should I keep my medicine? This drug is given in a hospital or clinic and will not be stored at home. NOTE: This sheet is a summary. It may not cover all possible information. If you have questions about this medicine, talk to your doctor, pharmacist, or health care provider.  2021 Elsevier/Gold Standard (2016-06-22 20:21:10)  

## 2020-08-23 NOTE — Progress Notes (Signed)
Subjective:   Patient ID: Todd Vincent, male   DOB: 45 y.o.   MRN: [id number]   HPI Patient presents in very poor health who is being treated for rectal cancer and has had Kaposi's sarcoma.  He just finished a heavy round of chemo and has swelling in his feet and has neuropathy and nerve-like symptoms    ROS      Objective:  Physical Exam  Neurovascular status is intact negative Bevelyn Buckles' sign noted with edema of his feet which is probably due to the chemo with numbness which is probably due to chemo     Assessment:  Extremely poor health individual with multiple health issues with swelling and nerve issues in both feet     Plan:  Reviewed conditions and discussed causes and I do not see treatment that I can do to help him currently.  Patient needs to continue his fluid medication and elevation and talk to the doctor who is doing the chemo for him.  Did spend a great deal of time going over the complexity of his medical conditions

## 2020-08-27 ENCOUNTER — Other Ambulatory Visit: Payer: Medicare Other

## 2020-08-27 LAB — AEROBIC CULTURE W GRAM STAIN (SUPERFICIAL SPECIMEN)

## 2020-09-10 ENCOUNTER — Encounter: Payer: Medicare Other | Admitting: Internal Medicine

## 2020-09-23 ENCOUNTER — Other Ambulatory Visit: Payer: Medicare Other

## 2020-09-25 ENCOUNTER — Inpatient Hospital Stay: Payer: Medicare Other

## 2020-09-25 ENCOUNTER — Inpatient Hospital Stay: Payer: Medicare Other | Admitting: Hematology & Oncology

## 2020-09-25 ENCOUNTER — Inpatient Hospital Stay: Payer: Medicare Other | Attending: Hematology & Oncology

## 2020-09-26 ENCOUNTER — Telehealth: Payer: Self-pay

## 2020-09-26 NOTE — Telephone Encounter (Signed)
Called and left a vm to r/s the no show appt from 09/25/20 per sch message   Loucile Posner

## 2020-10-11 ENCOUNTER — Encounter: Payer: Medicare Other | Admitting: Internal Medicine

## 2020-10-15 ENCOUNTER — Telehealth: Payer: Self-pay

## 2020-10-15 ENCOUNTER — Encounter: Payer: Self-pay | Admitting: Family

## 2020-10-15 NOTE — Telephone Encounter (Signed)
Returned pts call per sch message to r/s no show appt   Todd Vincent

## 2020-11-08 ENCOUNTER — Inpatient Hospital Stay: Payer: Medicare Other | Attending: Hematology & Oncology

## 2020-11-08 ENCOUNTER — Inpatient Hospital Stay: Payer: Medicare Other

## 2020-11-08 ENCOUNTER — Inpatient Hospital Stay: Payer: Medicare Other | Admitting: Hematology & Oncology

## 2020-11-08 ENCOUNTER — Other Ambulatory Visit: Payer: Self-pay | Admitting: Internal Medicine

## 2020-11-08 ENCOUNTER — Other Ambulatory Visit: Payer: Medicare Other

## 2020-11-08 DIAGNOSIS — B20 Human immunodeficiency virus [HIV] disease: Secondary | ICD-10-CM

## 2020-11-25 ENCOUNTER — Ambulatory Visit: Payer: Medicare Other | Admitting: Internal Medicine

## 2020-12-04 ENCOUNTER — Other Ambulatory Visit: Payer: Self-pay | Admitting: Family

## 2020-12-04 DIAGNOSIS — B029 Zoster without complications: Secondary | ICD-10-CM

## 2020-12-04 DIAGNOSIS — C469 Kaposi's sarcoma, unspecified: Secondary | ICD-10-CM

## 2020-12-13 ENCOUNTER — Telehealth: Payer: Self-pay | Admitting: Family

## 2020-12-13 NOTE — Telephone Encounter (Signed)
Opened in error

## 2021-02-15 DEATH — deceased
